# Patient Record
Sex: Female | Born: 1950
Health system: Southern US, Community
[De-identification: ages and names within clinical notes are randomized; demographics above are authoritative.]

## PROBLEM LIST (undated history)

## (undated) DIAGNOSIS — I340 Nonrheumatic mitral (valve) insufficiency: Secondary | ICD-10-CM

## (undated) DIAGNOSIS — Z8541 Personal history of malignant neoplasm of cervix uteri: Secondary | ICD-10-CM

## (undated) DIAGNOSIS — E785 Hyperlipidemia, unspecified: Secondary | ICD-10-CM

## (undated) DIAGNOSIS — J45909 Unspecified asthma, uncomplicated: Secondary | ICD-10-CM

## (undated) DIAGNOSIS — K219 Gastro-esophageal reflux disease without esophagitis: Secondary | ICD-10-CM

## (undated) DIAGNOSIS — I5189 Other ill-defined heart diseases: Secondary | ICD-10-CM

## (undated) DIAGNOSIS — M1990 Unspecified osteoarthritis, unspecified site: Secondary | ICD-10-CM

## (undated) DIAGNOSIS — G4730 Sleep apnea, unspecified: Secondary | ICD-10-CM

## (undated) DIAGNOSIS — R079 Chest pain, unspecified: Secondary | ICD-10-CM

## (undated) DIAGNOSIS — N189 Chronic kidney disease, unspecified: Secondary | ICD-10-CM

## (undated) DIAGNOSIS — I2721 Secondary pulmonary arterial hypertension: Secondary | ICD-10-CM

## (undated) DIAGNOSIS — I4819 Other persistent atrial fibrillation: Secondary | ICD-10-CM

## (undated) DIAGNOSIS — I10 Essential (primary) hypertension: Secondary | ICD-10-CM

## (undated) HISTORY — DX: Sleep apnea, unspecified: G47.30

## (undated) HISTORY — DX: Other persistent atrial fibrillation: I48.19

## (undated) HISTORY — DX: Chest pain, unspecified: R07.9

## (undated) HISTORY — DX: Gastro-esophageal reflux disease without esophagitis: K21.9

## (undated) HISTORY — PX: LIPOMA EXCISION: SHX5283

## (undated) HISTORY — DX: Hyperlipidemia, unspecified: E78.5

## (undated) HISTORY — DX: Other ill-defined heart diseases: I51.89

## (undated) HISTORY — DX: Essential (primary) hypertension: I10

## (undated) HISTORY — DX: Unspecified osteoarthritis, unspecified site: M19.90

## (undated) HISTORY — DX: Unspecified asthma, uncomplicated: J45.909

## (undated) HISTORY — DX: Secondary pulmonary arterial hypertension: I27.21

## (undated) HISTORY — DX: Personal history of malignant neoplasm of cervix uteri: Z85.41

## (undated) HISTORY — DX: Nonrheumatic mitral (valve) insufficiency: I34.0

---

## 1997-01-25 HISTORY — PX: ABDOMINAL HYSTERECTOMY: SHX81

## 2001-01-25 HISTORY — PX: HAND SURGERY: SHX662

## 2008-03-28 DIAGNOSIS — R079 Chest pain, unspecified: Secondary | ICD-10-CM | POA: Insufficient documentation

## 2008-03-28 DIAGNOSIS — J45909 Unspecified asthma, uncomplicated: Secondary | ICD-10-CM | POA: Insufficient documentation

## 2008-03-28 DIAGNOSIS — I10 Essential (primary) hypertension: Secondary | ICD-10-CM | POA: Insufficient documentation

## 2008-03-28 DIAGNOSIS — Z8541 Personal history of malignant neoplasm of cervix uteri: Secondary | ICD-10-CM | POA: Insufficient documentation

## 2008-03-28 LAB — HM PAP SMEAR: HM PAP: NEGATIVE

## 2008-04-11 DIAGNOSIS — E7800 Pure hypercholesterolemia, unspecified: Secondary | ICD-10-CM | POA: Insufficient documentation

## 2008-06-17 DIAGNOSIS — R1013 Epigastric pain: Secondary | ICD-10-CM | POA: Insufficient documentation

## 2008-06-19 ENCOUNTER — Ambulatory Visit: Payer: Self-pay | Admitting: Family Medicine

## 2014-11-21 ENCOUNTER — Ambulatory Visit (INDEPENDENT_AMBULATORY_CARE_PROVIDER_SITE_OTHER): Payer: Self-pay | Admitting: Family Medicine

## 2014-11-21 ENCOUNTER — Encounter: Payer: Self-pay | Admitting: Family Medicine

## 2014-11-21 VITALS — BP 178/88 | HR 84 | Temp 98.1°F | Resp 16 | Ht 65.0 in | Wt 230.0 lb

## 2014-11-21 DIAGNOSIS — E7800 Pure hypercholesterolemia, unspecified: Secondary | ICD-10-CM

## 2014-11-21 DIAGNOSIS — G4700 Insomnia, unspecified: Secondary | ICD-10-CM

## 2014-11-21 DIAGNOSIS — K8020 Calculus of gallbladder without cholecystitis without obstruction: Secondary | ICD-10-CM | POA: Insufficient documentation

## 2014-11-21 DIAGNOSIS — Z0000 Encounter for general adult medical examination without abnormal findings: Secondary | ICD-10-CM

## 2014-11-21 DIAGNOSIS — M25559 Pain in unspecified hip: Secondary | ICD-10-CM | POA: Insufficient documentation

## 2014-11-21 DIAGNOSIS — I10 Essential (primary) hypertension: Secondary | ICD-10-CM

## 2014-11-21 MED ORDER — LISINOPRIL 10 MG PO TABS: 10.0000 mg | ORAL_TABLET | Freq: Every day | ORAL | Status: DC

## 2014-11-21 MED ORDER — TRAZODONE HCL 50 MG PO TABS: 25.0000 mg | ORAL_TABLET | Freq: Every evening | ORAL | Status: DC | PRN

## 2014-11-21 NOTE — Progress Notes (Signed)
Patient ID: Whitney Stuart, female   DOB: September 05, 1950, 64 y.o.   MRN: [id number]        Patient: Whitney Stuart, Female    DOB: 07-30-1950, 64 y.o.   MRN: [id number] Visit Date: 11/21/2014  Today's Provider: Lorie Phenix, MD   Chief Complaint  Patient presents with  . Annual Exam   Subjective:    Annual physical exam Whitney Stuart is a 64 y.o. female who presents today for health maintenance and complete physical. She feels fairly well. She reports exercising stays active daily. She reports she is sleeping poorly, 4-5 hours of sleep. Has been that way since her children were little. Very light sleeper.  01/03/12 CPE 03/28/08 Pap-neg; hysterectomy in 1999 06/17/08 EKG 12/2011 Last OV   Hypertension, follow-up:  BP Readings from Last 3 Encounters:  11/21/14 178/88  01/03/12 182/92    She was last seen for hypertension 3 years ago.  BP at that visit was 182/92 Management changes since that visit include none. She reports poor compliance with treatment. She is not having side effects.  She is not exercising. She is adherent to low salt diet.   Outside blood pressures are not being checked. She is experiencing dyspnea, fatigue and lower extremity edema.  Patient denies chest pain.   Cardiovascular risk factors include none.  Use of agents associated with hypertension: none.     Weight trend: decreasing rapidly Wt Readings from Last 3 Encounters:  11/21/14 230 lb (104.327 kg)  01/03/12 199 lb (90.266 kg)    Current diet: in general, an "unhealthy" diet  ------------------------------------------------------------------------     Review of Systems  Constitutional: Negative.   HENT: Negative.   Eyes: Negative.   Respiratory: Positive for shortness of breath.   Cardiovascular: Positive for leg swelling.  Gastrointestinal: Positive for abdominal distention.  Endocrine: Negative.   Genitourinary: Negative.   Musculoskeletal: Negative.   Skin: Negative.     Allergic/Immunologic: Negative.   Neurological: Positive for numbness.  Hematological: Negative.   Psychiatric/Behavioral: Negative.     Social History She  reports that she has never smoked. She has never used smokeless tobacco. She reports that she does not drink alcohol or use illicit drugs. Social History   Social History  . Marital Status: Married    Spouse Name: N/A  . Number of Children: N/A  . Years of Education: N/A   Social History Main Topics  . Smoking status: Never Smoker   . Smokeless tobacco: Never Used  . Alcohol Use: No  . Drug Use: No  . Sexual Activity: Not Asked   Other Topics Concern  . None   Social History Narrative    Patient Active Problem List   Diagnosis Date Noted  . Calculus of gallbladder 11/21/2014  . Arthralgia of hip 11/21/2014  . Abdominal pain, epigastric 06/17/2008  . Pure hypercholesterolemia 04/11/2008  . Asthma, exogenous 03/28/2008  . Chest pain 03/28/2008  . Essential (primary) hypertension 03/28/2008  . Personal history of malignant neoplasm of cervix uteri 03/28/2008    Past Surgical History  Procedure Laterality Date  . Abdominal hysterectomy  1999    total  . Hand surgery Bilateral 2003    carpal tunnel    Family History  Family Status  Relation Status Death Age  . Father Deceased 70    kidney disease heavy smoker  . Mother Deceased 64    stroke  . Sister Alive   . Brother Deceased 61    lung cancer  .  Brother Alive    Her family history includes Heart disease in her brother; Hypertension in her father.    No Known Allergies  Previous Medications   No medications on file    Patient Care Team: Lorie PhenixNancy Sindhu Nguyen, MD as PCP - General (Family Medicine)     Objective:   Vitals: BP 178/88 mmHg  Pulse 84  Temp(Src) 98.1 F (36.7 C) (Oral)  Resp 16  Ht 5\' 5"  (1.651 m)  Wt 230 lb (104.327 kg)  BMI 38.27 kg/m2  SpO2 97%   Physical Exam  Constitutional: She is oriented to person, place, and time. She  appears well-developed and well-nourished.  HENT:  Head: Normocephalic and atraumatic.  Right Ear: Tympanic membrane, external ear and ear canal normal.  Left Ear: Tympanic membrane, external ear and ear canal normal.  Nose: Nose normal.  Mouth/Throat: Uvula is midline, oropharynx is clear and moist and mucous membranes are normal.  Eyes: Conjunctivae, EOM and lids are normal. Pupils are equal, round, and reactive to light.  Neck: Trachea normal and normal range of motion. Neck supple. Carotid bruit is not present. No thyroid mass and no thyromegaly present.  Cardiovascular: Normal rate, regular rhythm and normal heart sounds.   Pulmonary/Chest: Effort normal and breath sounds normal.  Abdominal: Soft. Normal appearance and bowel sounds are normal. There is no hepatosplenomegaly. There is no tenderness.  Genitourinary: No breast swelling, tenderness or discharge.  Musculoskeletal: Normal range of motion.  Lymphadenopathy:    She has no cervical adenopathy.    She has no axillary adenopathy.  Neurological: She is alert and oriented to person, place, and time. She has normal strength. No cranial nerve deficit.  Skin: Skin is warm, dry and intact.  Psychiatric: She has a normal mood and affect. Her speech is normal and behavior is normal. Judgment and thought content normal. Cognition and memory are normal.     Depression Screen PHQ 2/9 Scores 11/21/2014  PHQ - 2 Score 3  PHQ- 9 Score 12      Assessment & Plan:     Routine Health Maintenance and Physical Exam  Exercise Activities and Dietary recommendations Goals    . Exercise 150 minutes per week (moderate activity)        There is no immunization history on file for this patient.  Health Maintenance  Topic Date Due  . Hepatitis C Screening  1950/07/13  . HIV Screening  05/12/1966  . TETANUS/TDAP  05/12/1970  . PAP SMEAR  05/11/1972  . MAMMOGRAM  05/11/2001  . COLONOSCOPY  05/11/2001  . ZOSTAVAX  05/12/2011  .  INFLUENZA VACCINE  08/26/2014        1. Annual physical exam Stable. Patient advised to continue eating healthy and exercise daily.  2. Essential (primary) hypertension Worsening. Patient has been off of medications for a while due to no insurance. F/U pending lab report. - CBC with Differential/Platelet - Comprehensive metabolic panel  3. Pure hypercholesterolemia - Lipid Panel With LDL/HDL Ratio - TSH   Patient seen and examined by Dr. Leo GrosserNancy J.. Suan Pyeatt, and note scribed by Liz BeachSulibeya S. Dimas, CMA.  I have reviewed the document for accuracy and completeness and I agree with above. Leo Grosser- Charyl Minervini J. Jessa Stinson, MD  Lorie PhenixNancy Chantee Cerino, MD   --------------------------------------------------------------------

## 2014-11-22 DIAGNOSIS — Z0000 Encounter for general adult medical examination without abnormal findings: Secondary | ICD-10-CM

## 2014-11-23 LAB — COMPREHENSIVE METABOLIC PANEL
A/G RATIO: 1.4 (ref 1.1–2.5)
ALBUMIN: 4.2 g/dL (ref 3.6–4.8)
ALK PHOS: 61 IU/L (ref 39–117)
ALT: 15 IU/L (ref 0–32)
AST: 9 IU/L (ref 0–40)
BUN / CREAT RATIO: 11 (ref 11–26)
BUN: 10 mg/dL (ref 8–27)
Bilirubin Total: 0.4 mg/dL (ref 0.0–1.2)
CO2: 25 mmol/L (ref 18–29)
CREATININE: 0.9 mg/dL (ref 0.57–1.00)
Calcium: 9.5 mg/dL (ref 8.7–10.3)
Chloride: 103 mmol/L (ref 97–106)
GFR calc Af Amer: 79 mL/min/{1.73_m2} (ref >59)
GFR calc non Af Amer: 68 mL/min/{1.73_m2} (ref >59)
GLOBULIN, TOTAL: 3 g/dL (ref 1.5–4.5)
Glucose: 90 mg/dL (ref 65–99)
POTASSIUM: 4.4 mmol/L (ref 3.5–5.2)
SODIUM: 142 mmol/L (ref 136–144)
Total Protein: 7.2 g/dL (ref 6.0–8.5)

## 2014-11-23 LAB — LIPID PANEL WITH LDL/HDL RATIO
Cholesterol, Total: 219 mg/dL — ABNORMAL HIGH (ref 100–199)
HDL: 49 mg/dL (ref >39)
LDL CALC: 137 mg/dL — AB (ref 0–99)
LDL/HDL RATIO: 2.8 ratio (ref 0.0–3.2)
Triglycerides: 163 mg/dL — ABNORMAL HIGH (ref 0–149)
VLDL Cholesterol Cal: 33 mg/dL (ref 5–40)

## 2014-11-23 LAB — CBC WITH DIFFERENTIAL/PLATELET
Basophils Absolute: 0.1 x10E3/uL (ref 0.0–0.2)
Basos: 1 %
EOS (ABSOLUTE): 0.2 x10E3/uL (ref 0.0–0.4)
Eos: 3 %
Hematocrit: 36.4 % (ref 34.0–46.6)
Hemoglobin: 12.2 g/dL (ref 11.1–15.9)
Immature Grans (Abs): 0 x10E3/uL (ref 0.0–0.1)
Immature Granulocytes: 0 %
Lymphocytes Absolute: 3.2 x10E3/uL — ABNORMAL HIGH (ref 0.7–3.1)
Lymphs: 40 %
MCH: 30.3 pg (ref 26.6–33.0)
MCHC: 33.5 g/dL (ref 31.5–35.7)
MCV: 91 fL (ref 79–97)
Monocytes Absolute: 0.7 x10E3/uL (ref 0.1–0.9)
Monocytes: 9 %
Neutrophils Absolute: 3.9 x10E3/uL (ref 1.4–7.0)
Neutrophils: 47 %
Platelets: 346 x10E3/uL (ref 150–379)
RBC: 4.02 x10E6/uL (ref 3.77–5.28)
RDW: 13.9 % (ref 12.3–15.4)
WBC: 8.1 x10E3/uL (ref 3.4–10.8)

## 2014-11-23 LAB — TSH: TSH: 0.888 u[IU]/mL (ref 0.450–4.500)

## 2014-11-26 ENCOUNTER — Telehealth: Payer: Self-pay

## 2014-11-26 DIAGNOSIS — E7800 Pure hypercholesterolemia, unspecified: Secondary | ICD-10-CM

## 2014-11-26 MED ORDER — ATORVASTATIN CALCIUM 10 MG PO TABS: 10.0000 mg | ORAL_TABLET | Freq: Every day | ORAL | Status: DC

## 2014-11-26 NOTE — Telephone Encounter (Signed)
-----   Message from Lorie PhenixNancy Maloney, MD sent at 11/24/2014  9:47 AM EDT ----- Labs stable.  Cholesterol is 219.  10 year risk of heart disease is almost 13 percent.  A statin drug is recommended. Please see if patient would like to start a statin. Also,  Make sure  to start to baby ASA daily . Thanks.

## 2014-11-26 NOTE — Telephone Encounter (Signed)
Pt advised.  She agreed to start a statin; please send to [facility] [address]  I advised her of the potential side effects; and told her to stop taking the medicine and give us a call.  She is going to call in 4-6 weeks to get her cholesterol rechecked.  Thanks,   -Vernona RiegerLaura

## 2014-12-09 ENCOUNTER — Telehealth: Payer: Self-pay | Admitting: Family Medicine

## 2014-12-09 DIAGNOSIS — I10 Essential (primary) hypertension: Secondary | ICD-10-CM

## 2014-12-09 MED ORDER — AMLODIPINE BESYLATE 5 MG PO TABS: 5.0000 mg | ORAL_TABLET | Freq: Every day | ORAL | Status: DC

## 2014-12-09 NOTE — Telephone Encounter (Signed)
Pt reports her daughter is paramedic who checked her BP last night. Pt denied any sx at the time of elevated BP. [facility] Pt will schedule 4 week FU, but is aware to monitor BP and call if no improvement. Allene DillonEmily Drozdowski, CMA

## 2014-12-09 NOTE — Telephone Encounter (Signed)
Pt said you ask her to call in today with her blood pressure.  Which is 230/120 that was yesterday.  Call back is [phone]  Thanks Barth Kirksteri

## 2016-11-24 DIAGNOSIS — Z6836 Body mass index (BMI) 36.0-36.9, adult: Secondary | ICD-10-CM | POA: Diagnosis not present

## 2016-11-24 DIAGNOSIS — Z0000 Encounter for general adult medical examination without abnormal findings: Secondary | ICD-10-CM | POA: Diagnosis not present

## 2016-11-24 DIAGNOSIS — Z87891 Personal history of nicotine dependence: Secondary | ICD-10-CM | POA: Diagnosis not present

## 2016-11-24 DIAGNOSIS — Z8249 Family history of ischemic heart disease and other diseases of the circulatory system: Secondary | ICD-10-CM | POA: Diagnosis not present

## 2016-11-24 DIAGNOSIS — E669 Obesity, unspecified: Secondary | ICD-10-CM | POA: Diagnosis not present

## 2017-01-12 ENCOUNTER — Ambulatory Visit (INDEPENDENT_AMBULATORY_CARE_PROVIDER_SITE_OTHER): Payer: Medicare HMO | Admitting: Family Medicine

## 2017-01-12 ENCOUNTER — Other Ambulatory Visit: Payer: Self-pay

## 2017-01-12 ENCOUNTER — Other Ambulatory Visit: Payer: Self-pay | Admitting: Family Medicine

## 2017-01-12 ENCOUNTER — Encounter: Payer: Self-pay | Admitting: Family Medicine

## 2017-01-12 VITALS — BP 162/102 | HR 81 | Temp 98.1°F | Resp 16 | Ht 64.0 in | Wt 215.0 lb

## 2017-01-12 DIAGNOSIS — Z0000 Encounter for general adult medical examination without abnormal findings: Secondary | ICD-10-CM | POA: Diagnosis not present

## 2017-01-12 DIAGNOSIS — Z23 Encounter for immunization: Secondary | ICD-10-CM

## 2017-01-12 DIAGNOSIS — Z780 Asymptomatic menopausal state: Secondary | ICD-10-CM | POA: Diagnosis not present

## 2017-01-12 DIAGNOSIS — G4700 Insomnia, unspecified: Secondary | ICD-10-CM

## 2017-01-12 DIAGNOSIS — Z1211 Encounter for screening for malignant neoplasm of colon: Secondary | ICD-10-CM | POA: Diagnosis not present

## 2017-01-12 DIAGNOSIS — Z1231 Encounter for screening mammogram for malignant neoplasm of breast: Secondary | ICD-10-CM | POA: Diagnosis not present

## 2017-01-12 DIAGNOSIS — I10 Essential (primary) hypertension: Secondary | ICD-10-CM

## 2017-01-12 DIAGNOSIS — E7800 Pure hypercholesterolemia, unspecified: Secondary | ICD-10-CM

## 2017-01-12 DIAGNOSIS — Z1239 Encounter for other screening for malignant neoplasm of breast: Secondary | ICD-10-CM

## 2017-01-12 MED ORDER — ATORVASTATIN CALCIUM 10 MG PO TABS: 10.0000 mg | ORAL_TABLET | Freq: Every day | ORAL | 1 refills | Status: DC

## 2017-01-12 MED ORDER — LISINOPRIL 10 MG PO TABS: 10.0000 mg | ORAL_TABLET | Freq: Every day | ORAL | 1 refills | Status: DC

## 2017-01-12 MED ORDER — AMLODIPINE BESYLATE 5 MG PO TABS: 5.0000 mg | ORAL_TABLET | Freq: Every day | ORAL | 1 refills | Status: DC

## 2017-01-12 MED ORDER — TRAZODONE HCL 50 MG PO TABS: 25.0000 mg | ORAL_TABLET | Freq: Every evening | ORAL | 1 refills | Status: DC | PRN

## 2017-01-12 NOTE — Patient Instructions (Signed)
Preventive Care 65 Years and Older, Female Preventive care refers to lifestyle choices and visits with your health care provider that can promote health and wellness. What does preventive care include?  A yearly physical exam. This is also called an annual well check.  Dental exams once or twice a year.  Routine eye exams. Ask your health care provider how often you should have your eyes checked.  Personal lifestyle choices, including: ? Daily care of your teeth and gums. ? Regular physical activity. ? Eating a healthy diet. ? Avoiding tobacco and drug use. ? Limiting alcohol use. ? Practicing safe sex. ? Taking low-dose aspirin every day. ? Taking vitamin and mineral supplements as recommended by your health care provider. What happens during an annual well check? The services and screenings done by your health care provider during your annual well check will depend on your age, overall health, lifestyle risk factors, and family history of disease. Counseling Your health care provider may ask you questions about your:  Alcohol use.  Tobacco use.  Drug use.  Emotional well-being.  Home and relationship well-being.  Sexual activity.  Eating habits.  History of falls.  Memory and ability to understand (cognition).  Work and work environment.  Reproductive health.  Screening You may have the following tests or measurements:  Height, weight, and BMI.  Blood pressure.  Lipid and cholesterol levels. These may be checked every 5 years, or more frequently if you are over 50 years old.  Skin check.  Lung cancer screening. You may have this screening every year starting at age 55 if you have a 30-pack-year history of smoking and currently smoke or have quit within the past 15 years.  Fecal occult blood test (FOBT) of the stool. You may have this test every year starting at age 50.  Flexible sigmoidoscopy or colonoscopy. You may have a sigmoidoscopy every 5 years or  a colonoscopy every 10 years starting at age 50.  Hepatitis C blood test.  Hepatitis B blood test.  Sexually transmitted disease (STD) testing.  Diabetes screening. This is done by checking your blood sugar (glucose) after you have not eaten for a while (fasting). You may have this done every 1-3 years.  Bone density scan. This is done to screen for osteoporosis. You may have this done starting at age 65.  Mammogram. This may be done every 1-2 years. Talk to your health care provider about how often you should have regular mammograms.  Talk with your health care provider about your test results, treatment options, and if necessary, the need for more tests. Vaccines Your health care provider may recommend certain vaccines, such as:  Influenza vaccine. This is recommended every year.  Tetanus, diphtheria, and acellular pertussis (Tdap, Td) vaccine. You may need a Td booster every 10 years.  Varicella vaccine. You may need this if you have not been vaccinated.  Zoster vaccine. You may need this after age 60.  Measles, mumps, and rubella (MMR) vaccine. You may need at least one dose of MMR if you were born in 1957 or later. You may also need a second dose.  Pneumococcal 13-valent conjugate (PCV13) vaccine. One dose is recommended after age 65.  Pneumococcal polysaccharide (PPSV23) vaccine. One dose is recommended after age 65.  Meningococcal vaccine. You may need this if you have certain conditions.  Hepatitis A vaccine. You may need this if you have certain conditions or if you travel or work in places where you may be exposed to hepatitis   A.  Hepatitis B vaccine. You may need this if you have certain conditions or if you travel or work in places where you may be exposed to hepatitis B.  Haemophilus influenzae type b (Hib) vaccine. You may need this if you have certain conditions.  Talk to your health care provider about which screenings and vaccines you need and how often you  need them. This information is not intended to replace advice given to you by your health care provider. Make sure you discuss any questions you have with your health care provider. Document Released: 02/07/2015 Document Revised: 10/01/2015 Document Reviewed: 11/12/2014 Elsevier Interactive Patient Education  2018 Elsevier Inc.  

## 2017-01-12 NOTE — Progress Notes (Signed)
Patient: Whitney Stuart, Female    DOB: 27-Jul-1950, 66 y.o.   MRN: [id number] Visit Date: 01/12/2017  Today's Provider: Shirlee Latch, MD   Chief Complaint  Patient presents with  . Medicare Wellness   I, Emily Ratchford, CMA, am acting as scribe for Shirlee Latch, MD.   Subjective:    Welcome to Medicare Whitney Stuart is a 66 y.o. female. She feels well. She reports exercising daily. Walks for 30-45 minutes. She reports she is sleeping poorly.  Last pap- 03/28/2008- S/P total hysterectomy in 2009 due to cervical cancer. No previous documentation of mammogram, dexa scan or colonoscopy.  -----------------------------------------------------------   Review of Systems  Constitutional: Negative.   HENT: Negative.   Eyes: Negative.   Respiratory: Negative.   Cardiovascular: Negative.   Gastrointestinal: Negative.   Endocrine: Negative.   Genitourinary: Negative.   Musculoskeletal: Negative.   Skin: Negative.   Allergic/Immunologic: Negative.   Neurological: Negative.   Hematological: Negative.   Psychiatric/Behavioral: Negative.     Social History   Socioeconomic History  . Marital status: Married    Spouse name: Not on file  . Number of children: Not on file  . Years of education: Not on file  . Highest education level: Not on file  Social Needs  . Financial resource strain: Not on file  . Food insecurity - worry: Not on file  . Food insecurity - inability: Not on file  . Transportation needs - medical: Not on file  . Transportation needs - non-medical: Not on file  Occupational History  . Not on file  Tobacco Use  . Smoking status: Never Smoker  . Smokeless tobacco: Never Used  Substance and Sexual Activity  . Alcohol use: No  . Drug use: No  . Sexual activity: Not on file  Other Topics Concern  . Not on file  Social History Narrative  . Not on file    Past Medical History:  Diagnosis Date  . Asthma   . Hyperlipidemia   .  Hypertension      Patient Active Problem List   Diagnosis Date Noted  . Calculus of gallbladder 11/21/2014  . Arthralgia of hip 11/21/2014  . Insomnia 11/21/2014  . Annual physical exam 11/21/2014  . Abdominal pain, epigastric 06/17/2008  . Pure hypercholesterolemia 04/11/2008  . Asthma, exogenous 03/28/2008  . Chest pain 03/28/2008  . Essential (primary) hypertension 03/28/2008  . Personal history of malignant neoplasm of cervix uteri 03/28/2008    Past Surgical History:  Procedure Laterality Date  . ABDOMINAL HYSTERECTOMY  1999   total  . HAND SURGERY Bilateral 2003   carpal tunnel    Her family history includes Heart disease in her brother; Hypertension in her father.      Current Outpatient Medications:  .  amLODipine (NORVASC) 5 MG tablet, Take 1 tablet (5 mg total) by mouth daily., Disp: 90 tablet, Rfl: 3 .  atorvastatin (LIPITOR) 10 MG tablet, Take 1 tablet (10 mg total) by mouth daily., Disp: 90 tablet, Rfl: 3 .  lisinopril (PRINIVIL,ZESTRIL) 10 MG tablet, Take 1 tablet (10 mg total) by mouth daily., Disp: 90 tablet, Rfl: 3 .  traZODone (DESYREL) 50 MG tablet, Take 0.5-1 tablets (25-50 mg total) by mouth at bedtime as needed for sleep., Disp: 30 tablet, Rfl: 3  Patient Care Team: Erasmo Downer, MD as PCP - General (Family Medicine)     Objective:   Vitals: There were no vitals taken for this visit.  Physical Exam  Constitutional: She is oriented to Stuart, place, and time. She appears well-developed and well-nourished. No distress.  HENT:  Head: Normocephalic and atraumatic.  Right Ear: External ear normal.  Left Ear: External ear normal.  Nose: Nose normal.  Mouth/Throat: Oropharynx is clear and moist. No oropharyngeal exudate.  Eyes: Conjunctivae and EOM are normal. Pupils are equal, round, and reactive to light. No scleral icterus.  Neck: Neck supple. No thyromegaly present.  Cardiovascular: Normal rate, regular rhythm, normal heart sounds and  intact distal pulses.  No murmur heard. Pulmonary/Chest: Effort normal and breath sounds normal. No respiratory distress. She has no wheezes. She has no rales.  Abdominal: Soft. Bowel sounds are normal. She exhibits no distension. There is no tenderness. There is no rebound and no guarding.  Genitourinary:  Genitourinary Comments: deferred  Musculoskeletal: She exhibits no edema or deformity.  Lymphadenopathy:    She has no cervical adenopathy.  Neurological: She is alert and oriented to Stuart, place, and time.  Skin: Skin is warm and dry. No rash noted.  Psychiatric: She has a normal mood and affect. Her behavior is normal.  Vitals reviewed.   Activities of Daily Living No flowsheet data found.  Fall Risk Assessment Fall Risk  11/21/2014  Falls in the past year? No     Depression Screen PHQ 2/9 Scores 11/21/2014  PHQ - 2 Score 3  PHQ- 9 Score 12    Cognitive Testing - 6-CIT  Correct? Score   What year is it? yes 0 0 or 4  What month is it? yes 0 0 or 3  Memorize:    [name],  [name],  43,  [address],  [name],      What time is it? (within 1 hour) yes 0 0 or 3  Count backwards from 20 yes 0 0, 2, or 4  Name the months of the year yes 0 0, 2, or 4  Repeat name & address above no 2 0, 2, 4, 6, 8, or 10       TOTAL SCORE  2/28   Interpretation:  Normal  Normal (0-7) Abnormal (8-28)    Audit-C Alcohol Use Screening  Question Answer Points  How often do you have alcoholic drink? never 0  On days you do drink alcohol, how many drinks do you typically consume? never 0  How oftey will you drink 6 or more in a total? never 0  Total Score:  0   A score of 3 or more in women, and 4 or more in men indicates increased risk for alcohol abuse, EXCEPT if all of the points are from question 1.   EKG reviewed: NSR, normal axis, intervals, no LVH or ST/T wave changes to suggest ischemia  Assessment & Plan:     Annual Wellness Visit  Reviewed patient's Family Medical  History Reviewed and updated list of patient's medical providers Assessment of cognitive impairment was done Assessed patient's functional ability Established a written schedule for health screening services Health Risk Assessent Completed and Reviewed  Exercise Activities and Dietary recommendations Goals    . Exercise 150 minutes per week (moderate activity)        There is no immunization history on file for this patient.  Health Maintenance  Topic Date Due  . Hepatitis C Screening  [birth year]  . HIV Screening  05/12/1966  . TETANUS/TDAP  05/12/1970  . MAMMOGRAM  05/11/2001  . COLONOSCOPY  05/11/2001  . PAP SMEAR  03/29/2011  . DEXA SCAN  05/11/2016  . PNA vac Low Risk Adult (1 of 2 - PCV13) 05/11/2016  . INFLUENZA VACCINE  08/25/2016     Discussed health benefits of physical activity, and encouraged her to engage in regular exercise appropriate for her age and condition.     Recommend Hep C, HIV screening - prefers to defer to f/u chronic disease visit when other labs will be obtained BP is elevated - will defer to f/u visit and resume meds Recommend pap smear of vaginal cuff given h/o cervical cancer - will complete at next visit   Problem List Items Addressed This Visit    None    Visit Diagnoses    Welcome to Medicare preventive visit    -  Primary   Relevant Orders   EKG 12-Lead (Completed)   Need for pneumococcal vaccination       Relevant Orders   Pneumococcal conjugate vaccine 13-valent IM (Completed)   Flu vaccine need       Relevant Orders   Flu vaccine HIGH DOSE PF (Completed)   Colon cancer screening       Relevant Orders   Cologuard   Breast cancer screening       Relevant Orders   MM Digital Screening   Postmenopausal       Relevant Orders   DG Bone Density     ------------------------------------------------------------------------------------------------------------   Return in about 4 weeks (around 02/09/2017) for BP f/u.  The  entirety of the information documented in the History of Present Illness, Review of Systems and Physical Exam were personally obtained by me. Portions of this information were initially documented by Irving BurtonEmily Ratchford, CMA and reviewed by me for thoroughness and accuracy.    Erasmo DownerBacigalupo, Hart Haas M, MD, MPH [facility] 01/12/2017 11:18 AM

## 2017-01-24 ENCOUNTER — Telehealth: Payer: Self-pay | Admitting: Family Medicine

## 2017-01-24 NOTE — Telephone Encounter (Signed)
Order for cologuard faxed to [facility] °

## 2017-02-04 ENCOUNTER — Other Ambulatory Visit: Payer: Self-pay | Admitting: Family Medicine

## 2017-02-04 DIAGNOSIS — G4700 Insomnia, unspecified: Secondary | ICD-10-CM

## 2017-02-04 DIAGNOSIS — I10 Essential (primary) hypertension: Secondary | ICD-10-CM

## 2017-02-04 DIAGNOSIS — E7800 Pure hypercholesterolemia, unspecified: Secondary | ICD-10-CM

## 2017-02-04 MED ORDER — ATORVASTATIN CALCIUM 10 MG PO TABS: 10.0000 mg | ORAL_TABLET | Freq: Every day | ORAL | 1 refills | Status: DC

## 2017-02-04 MED ORDER — TRAZODONE HCL 50 MG PO TABS: 25.0000 mg | ORAL_TABLET | Freq: Every evening | ORAL | 1 refills | Status: DC | PRN

## 2017-02-04 MED ORDER — AMLODIPINE BESYLATE 5 MG PO TABS: 5.0000 mg | ORAL_TABLET | Freq: Every day | ORAL | 1 refills | Status: DC

## 2017-02-04 MED ORDER — LISINOPRIL 10 MG PO TABS: 10.0000 mg | ORAL_TABLET | Freq: Every day | ORAL | 1 refills | Status: DC

## 2017-02-04 NOTE — Telephone Encounter (Signed)
[facility] faxed refill request for the following medications:  1. atorvastatin (LIPITOR) 10 MG tablet  2. traZODone (DESYREL) 50 MG tablet  3. amLODipine (NORVASC) 5 MG tablet 4. lisinopril (PRINIVIL,ZESTRIL) 10 MG tablet  90 day supply  Last Rx: 01/12/17 30 day supply with 1 refill LOV: 01/12/17 Please advise. Thanks TNP

## 2017-02-04 NOTE — Telephone Encounter (Signed)
[facility] faxed refill request for the following medications:  atorvastatin (LIPITOR) 10 MG tablet   90 day supply   Last Rx: 01/12/17 30 day supply with 1 refill LOV: 01/12/17 Please advise. Thanks TNP

## 2017-02-07 DIAGNOSIS — Z1211 Encounter for screening for malignant neoplasm of colon: Secondary | ICD-10-CM | POA: Diagnosis not present

## 2017-02-07 LAB — COLOGUARD: COLOGUARD: NEGATIVE

## 2017-02-09 ENCOUNTER — Ambulatory Visit (INDEPENDENT_AMBULATORY_CARE_PROVIDER_SITE_OTHER): Payer: Medicare HMO | Admitting: Family Medicine

## 2017-02-09 ENCOUNTER — Encounter: Payer: Self-pay | Admitting: Family Medicine

## 2017-02-09 VITALS — BP 160/90 | HR 77 | Temp 98.5°F | Resp 16 | Wt 218.0 lb

## 2017-02-09 DIAGNOSIS — E669 Obesity, unspecified: Secondary | ICD-10-CM | POA: Diagnosis not present

## 2017-02-09 DIAGNOSIS — R69 Illness, unspecified: Secondary | ICD-10-CM | POA: Diagnosis not present

## 2017-02-09 DIAGNOSIS — Z6837 Body mass index (BMI) 37.0-37.9, adult: Secondary | ICD-10-CM | POA: Diagnosis not present

## 2017-02-09 DIAGNOSIS — I10 Essential (primary) hypertension: Secondary | ICD-10-CM | POA: Diagnosis not present

## 2017-02-09 DIAGNOSIS — Z114 Encounter for screening for human immunodeficiency virus [HIV]: Secondary | ICD-10-CM | POA: Diagnosis not present

## 2017-02-09 DIAGNOSIS — E7800 Pure hypercholesterolemia, unspecified: Secondary | ICD-10-CM | POA: Diagnosis not present

## 2017-02-09 DIAGNOSIS — Z1159 Encounter for screening for other viral diseases: Secondary | ICD-10-CM

## 2017-02-09 DIAGNOSIS — E66811 Obesity, class 1: Secondary | ICD-10-CM | POA: Insufficient documentation

## 2017-02-09 MED ORDER — AMLODIPINE BESYLATE 10 MG PO TABS: 10.0000 mg | ORAL_TABLET | Freq: Every day | ORAL | 3 refills | Status: DC

## 2017-02-09 NOTE — Assessment & Plan Note (Signed)
Chronic, uncontrolled Increase amlodipine to 10mg  daily Continue lisinopril at current dose Check CMP F/u in 1 month, if needed, can increase lisinopril

## 2017-02-09 NOTE — Assessment & Plan Note (Signed)
Discussed diet and exrecise and importance of healthy weight Discussed intermittent fasting and decreasing carb intake Given DM diet handout

## 2017-02-09 NOTE — Progress Notes (Signed)
Patient: Whitney Stuart Female    DOB: 04-Aug-1950   67 y.o.   MRN: [id number] Visit Date: 02/09/2017  Today's Provider: Shirlee Latch, MD   I, Joslyn Hy, CMA, am acting as scribe for Shirlee Latch, MD.  Chief Complaint  Patient presents with  . Hypertension  . Hyperlipidemia   Subjective:    HPI      Hypertension, follow-up:  BP Readings from Last 3 Encounters:  02/09/17 (!) 160/90  01/12/17 (!) 162/102  11/21/14 (!) 178/88    Pt was last seen 4 weeks ago. BP at that visit was 162/102. Management since that visit includes no changes.She reports good compliance with treatment. She is not having side effects.  She is not exercising. She is adherent to low salt diet.   Outside blood pressures are similar to readings in the office. Pt uses wrist monitor. She is experiencing fatigue and lower extremity edema.  Patient denies chest pain, chest pressure/discomfort, claudication, dyspnea, exertional chest pressure/discomfort, irregular heart beat, near-syncope, orthopnea, palpitations and syncope.   Cardiovascular risk factors include dyslipidemia, family history of premature cardiovascular disease and hypertension.  Use of agents associated with hypertension: none.   ------------------------------------------------------------------------    Lipid/Cholesterol, Follow-up:   No management changes were made at last office visit.  Last Lipid Panel:    Component Value Date/Time   CHOL 219 (H) 11/22/2014 1221   TRIG 163 (H) 11/22/2014 1221   HDL 49 11/22/2014 1221   LDLCALC 137 (H) 11/22/2014 1221    She reports good compliance with treatment. She is not having side effects.   Wt Readings from Last 3 Encounters:  02/09/17 218 lb (98.9 kg)  01/12/17 215 lb (97.5 kg)  11/21/14 230 lb (104.3 kg)    ------------------------------------------------------------------------   No Known Allergies   Current Outpatient Medications:  .  amLODipine  (NORVASC) 5 MG tablet, Take 1 tablet (5 mg total) by mouth daily., Disp: 90 tablet, Rfl: 1 .  atorvastatin (LIPITOR) 10 MG tablet, Take 1 tablet (10 mg total) by mouth daily., Disp: 90 tablet, Rfl: 1 .  lisinopril (PRINIVIL,ZESTRIL) 10 MG tablet, Take 1 tablet (10 mg total) by mouth daily., Disp: 90 tablet, Rfl: 1 .  traZODone (DESYREL) 50 MG tablet, Take 0.5-1 tablets (25-50 mg total) by mouth at bedtime as needed for sleep., Disp: 90 tablet, Rfl: 1  Review of Systems  Constitutional: Positive for fatigue. Negative for activity change, appetite change, chills, diaphoresis, fever and unexpected weight change.  Respiratory: Negative for cough and shortness of breath.   Cardiovascular: Positive for leg swelling (ankles after standing too long). Negative for chest pain and palpitations.    Social History   Tobacco Use  . Smoking status: Former Smoker    Packs/day: 0.50    Years: 6.00    Pack years: 3.00    Last attempt to quit: 01/24/1997    Years since quitting: 20.0  . Smokeless tobacco: Never Used  Substance Use Topics  . Alcohol use: No   Objective:   BP (!) 160/90 (BP Location: Left Arm, Patient Position: Sitting, Cuff Size: Large)   Pulse 77   Temp 98.5 F (36.9 C) (Oral)   Resp 16   Wt 218 lb (98.9 kg)   SpO2 96%   BMI 37.42 kg/m  Vitals:   02/09/17 1054  BP: (!) 160/90  Pulse: 77  Resp: 16  Temp: 98.5 F (36.9 C)  TempSrc: Oral  SpO2: 96%  Weight: 218 lb (98.9  kg)     Physical Exam  Constitutional: She is oriented to person, place, and time. She appears well-developed and well-nourished. No distress.  HENT:  Head: Normocephalic and atraumatic.  Eyes: Conjunctivae are normal. No scleral icterus.  Neck: Neck supple.  Cardiovascular: Normal rate, regular rhythm, normal heart sounds and intact distal pulses.  No murmur heard. Pulmonary/Chest: Effort normal and breath sounds normal. No respiratory distress. She has no wheezes. She has no rales.    Musculoskeletal: She exhibits no edema or deformity.  Lymphadenopathy:    She has no cervical adenopathy.  Neurological: She is alert and oriented to person, place, and time.  Skin: Skin is warm and dry. No rash noted.  Psychiatric: She has a normal mood and affect. Her behavior is normal.  Vitals reviewed.       Assessment & Plan:      Problem List Items Addressed This Visit      Cardiovascular and Mediastinum   Essential (primary) hypertension    Chronic, uncontrolled Increase amlodipine to 10mg  daily Continue lisinopril at current dose Check CMP F/u in 1 month, if needed, can increase lisinopril      Relevant Medications   amLODipine (NORVASC) 10 MG tablet   Other Relevant Orders   Comprehensive metabolic panel     Other   Pure hypercholesterolemia - Primary    Continue lipitor Recheck lipid panel      Relevant Medications   amLODipine (NORVASC) 10 MG tablet   Other Relevant Orders   Comprehensive metabolic panel   Lipid Profile   Obesity    Discussed diet and exrecise and importance of healthy weight Discussed intermittent fasting and decreasing carb intake Given DM diet handout      Relevant Orders   CBC    Other Visit Diagnoses    Screening for HIV (human immunodeficiency virus)       Relevant Orders   HIV antibody (with reflex)   Need for hepatitis C screening test       Relevant Orders   Hepatitis C Antibody     Needs pap smear for h/o cervical cancer at f/u visit  Return in about 4 weeks (around 03/09/2017) for BP f/u.      The entirety of the information documented in the History of Present Illness, Review of Systems and Physical Exam were personally obtained by me. Portions of this information were initially documented by Irving BurtonEmily Ratchford, CMA and reviewed by me for thoroughness and accuracy.    Erasmo DownerBacigalupo, Latrica Clowers M, MD, MPH [facility] 02/09/2017 1:03 PM

## 2017-02-09 NOTE — Patient Instructions (Signed)

## 2017-02-09 NOTE — Assessment & Plan Note (Signed)
Continue lipitor Recheck lipid panel 

## 2017-02-10 ENCOUNTER — Telehealth: Payer: Self-pay

## 2017-02-10 LAB — LIPID PANEL
CHOL/HDL RATIO: 2.8 ratio (ref 0.0–4.4)
Cholesterol, Total: 164 mg/dL (ref 100–199)
HDL: 58 mg/dL (ref >39)
LDL Calculated: 88 mg/dL (ref 0–99)
TRIGLYCERIDES: 88 mg/dL (ref 0–149)
VLDL CHOLESTEROL CAL: 18 mg/dL (ref 5–40)

## 2017-02-10 LAB — COMPREHENSIVE METABOLIC PANEL
ALBUMIN: 4.3 g/dL (ref 3.6–4.8)
ALT: 18 IU/L (ref 0–32)
AST: 9 IU/L (ref 0–40)
Albumin/Globulin Ratio: 1.3 (ref 1.2–2.2)
Alkaline Phosphatase: 67 IU/L (ref 39–117)
BUN / CREAT RATIO: 14 (ref 12–28)
BUN: 13 mg/dL (ref 8–27)
Bilirubin Total: 0.4 mg/dL (ref 0.0–1.2)
CALCIUM: 9.9 mg/dL (ref 8.7–10.3)
CO2: 22 mmol/L (ref 20–29)
CREATININE: 0.9 mg/dL (ref 0.57–1.00)
Chloride: 104 mmol/L (ref 96–106)
GFR calc Af Amer: 78 mL/min/{1.73_m2} (ref >59)
GFR, EST NON AFRICAN AMERICAN: 67 mL/min/{1.73_m2} (ref >59)
GLOBULIN, TOTAL: 3.2 g/dL (ref 1.5–4.5)
Glucose: 95 mg/dL (ref 65–99)
Potassium: 5.1 mmol/L (ref 3.5–5.2)
Sodium: 143 mmol/L (ref 134–144)
TOTAL PROTEIN: 7.5 g/dL (ref 6.0–8.5)

## 2017-02-10 LAB — CBC
HEMATOCRIT: 36.2 % (ref 34.0–46.6)
Hemoglobin: 12.4 g/dL (ref 11.1–15.9)
MCH: 31.1 pg (ref 26.6–33.0)
MCHC: 34.3 g/dL (ref 31.5–35.7)
MCV: 91 fL (ref 79–97)
PLATELETS: 342 10*3/uL (ref 150–379)
RBC: 3.99 x10E6/uL (ref 3.77–5.28)
RDW: 13.2 % (ref 12.3–15.4)
WBC: 7.4 10*3/uL (ref 3.4–10.8)

## 2017-02-10 LAB — HEPATITIS C ANTIBODY: Hep C Virus Ab: <0.1 s/co ratio (ref 0.0–0.9)

## 2017-02-10 LAB — HIV ANTIBODY (ROUTINE TESTING W REFLEX): HIV SCREEN 4TH GENERATION: NONREACTIVE

## 2017-02-10 NOTE — Telephone Encounter (Signed)
Pt advised.

## 2017-02-10 NOTE — Telephone Encounter (Signed)
-----   Message from Erasmo DownerAngela M Bacigalupo, MD sent at 02/10/2017 10:11 AM EST ----- Normal kidney function, liver function, electrolytes, Blood counts, cholesterol. Negative HIV and Hep C screening  Bacigalupo, Marzella SchleinAngela M, MD, MPH [facility] 02/10/2017 10:11 AM

## 2017-02-17 ENCOUNTER — Other Ambulatory Visit: Payer: Self-pay

## 2017-02-17 ENCOUNTER — Telehealth: Payer: Self-pay

## 2017-02-17 NOTE — Telephone Encounter (Signed)
-----   Message from Erasmo DownerAngela M Bacigalupo, MD sent at 02/17/2017 11:47 AM EST ----- Cologuard (for colon cancer screening) is negative. Repeat some form of colon cancer screening in 3 years.  Erasmo DownerBacigalupo, Angela M, MD, MPH [facility] 02/17/2017 11:47 AM

## 2017-02-17 NOTE — Telephone Encounter (Signed)
Pt advised and agrees with plan. 

## 2017-02-22 ENCOUNTER — Ambulatory Visit
Admission: RE | Admit: 2017-02-22 | Discharge: 2017-02-22 | Disposition: A | Discharge status: Home / Self Care | Payer: Medicare HMO | Source: Ambulatory Visit | Attending: Family Medicine | Admitting: Family Medicine

## 2017-02-22 DIAGNOSIS — Z780 Asymptomatic menopausal state: Secondary | ICD-10-CM | POA: Diagnosis not present

## 2017-02-22 DIAGNOSIS — Z1231 Encounter for screening mammogram for malignant neoplasm of breast: Secondary | ICD-10-CM | POA: Insufficient documentation

## 2017-02-22 DIAGNOSIS — M85852 Other specified disorders of bone density and structure, left thigh: Secondary | ICD-10-CM | POA: Insufficient documentation

## 2017-02-22 DIAGNOSIS — Z13820 Encounter for screening for osteoporosis: Secondary | ICD-10-CM | POA: Insufficient documentation

## 2017-02-22 DIAGNOSIS — Z1239 Encounter for other screening for malignant neoplasm of breast: Secondary | ICD-10-CM

## 2017-02-23 ENCOUNTER — Telehealth: Payer: Self-pay

## 2017-02-23 NOTE — Telephone Encounter (Signed)
Left message advising pt. OK per DPR. 

## 2017-02-23 NOTE — Telephone Encounter (Signed)
-----   Message from Erasmo DownerAngela M Bacigalupo, MD sent at 02/23/2017  9:21 AM EST ----- Normal mammogram  Bacigalupo, Marzella SchleinAngela M, MD, MPH [facility] 02/23/2017 9:21 AM

## 2017-02-23 NOTE — Telephone Encounter (Signed)
-----   Message from Erasmo DownerAngela M Bacigalupo, MD sent at 02/23/2017 10:06 AM EST ----- Bone density scans shows osteopenia (this is some bone loss, but not as bad as osteoporosis).  Recommend regular weight bearing exercise, avoiding smoking, and adequate Ca (1200mg /day) and Vit D (1000 units daily) via diet or supplement.  We will recheck in 2 years to ensure this hasn't worsened.  Erasmo DownerBacigalupo, Angela M, MD, MPH [facility] 02/23/2017 10:06 AM

## 2017-03-09 ENCOUNTER — Ambulatory Visit (INDEPENDENT_AMBULATORY_CARE_PROVIDER_SITE_OTHER): Payer: Medicare HMO | Admitting: Family Medicine

## 2017-03-09 ENCOUNTER — Encounter: Payer: Self-pay | Admitting: Family Medicine

## 2017-03-09 VITALS — BP 134/76 | HR 80 | Temp 98.2°F | Resp 16 | Wt 215.0 lb

## 2017-03-09 DIAGNOSIS — M62838 Other muscle spasm: Secondary | ICD-10-CM | POA: Diagnosis not present

## 2017-03-09 DIAGNOSIS — G4700 Insomnia, unspecified: Secondary | ICD-10-CM | POA: Diagnosis not present

## 2017-03-09 DIAGNOSIS — I10 Essential (primary) hypertension: Secondary | ICD-10-CM

## 2017-03-09 DIAGNOSIS — Z124 Encounter for screening for malignant neoplasm of cervix: Secondary | ICD-10-CM | POA: Diagnosis not present

## 2017-03-09 DIAGNOSIS — Z1272 Encounter for screening for malignant neoplasm of vagina: Secondary | ICD-10-CM

## 2017-03-09 DIAGNOSIS — E7800 Pure hypercholesterolemia, unspecified: Secondary | ICD-10-CM

## 2017-03-09 DIAGNOSIS — Z8541 Personal history of malignant neoplasm of cervix uteri: Secondary | ICD-10-CM

## 2017-03-09 MED ORDER — ATORVASTATIN CALCIUM 10 MG PO TABS: 10.0000 mg | ORAL_TABLET | Freq: Every day | ORAL | 3 refills | Status: DC

## 2017-03-09 MED ORDER — LISINOPRIL 10 MG PO TABS: 10.0000 mg | ORAL_TABLET | Freq: Every day | ORAL | 3 refills | Status: DC

## 2017-03-09 MED ORDER — AMLODIPINE BESYLATE 10 MG PO TABS: 10.0000 mg | ORAL_TABLET | Freq: Every day | ORAL | 3 refills | Status: DC

## 2017-03-09 MED ORDER — TRAZODONE HCL 50 MG PO TABS: 25.0000 mg | ORAL_TABLET | Freq: Every evening | ORAL | 3 refills | Status: DC | PRN

## 2017-03-09 MED ORDER — BACLOFEN 10 MG PO TABS: 10.0000 mg | ORAL_TABLET | Freq: Three times a day (TID) | ORAL | 1 refills | Status: DC

## 2017-03-09 NOTE — Assessment & Plan Note (Signed)
Well controlled Continue amlodipine and lisinopril at current doses Refills sent Recent labs reviewed, Cr and electrolytes wnl F/u in 6 months

## 2017-03-09 NOTE — Assessment & Plan Note (Signed)
Continue lipitor - refill sent

## 2017-03-09 NOTE — Assessment & Plan Note (Signed)
Discussed ice/heat Can continue tylenol prn Trial of baclofen for muscle relaxant HEP with stretching given Return precautions discussed

## 2017-03-09 NOTE — Progress Notes (Signed)
Patient: Whitney Stuart Female    DOB: July 08, 1950   67 y.o.   MRN: [id number] Visit Date: 03/09/2017  Today's Provider: Shirlee Latch, MD   I, Joslyn Hy, CMA, am acting as scribe for Shirlee Latch, MD.  Chief Complaint  Patient presents with  . Hypertension  . History of Cervical Cancer   Subjective:    HPI      Hypertension, follow-up:  BP Readings from Last 3 Encounters:  03/09/17 134/76  02/09/17 (!) 160/90  01/12/17 (!) 162/102    She was last seen for hypertension 4 weeks ago.  BP at that visit was 160/90. Management since that visit includes increasing amlodipine to 10 mg, and continuing lisinopril at same dose. She reports good compliance with treatment. She is not having side effects.  She is exercising. She is doing yard work when the weather is nice, and is walking inside. She is adherent to low salt diet.   Outside blood pressures are still elevated per pt. She is experiencing fatigue and lower extremity edema.  Patient denies chest pain, chest pressure/discomfort, claudication, dyspnea, exertional chest pressure/discomfort, irregular heart beat, near-syncope, orthopnea, palpitations and syncope.   Cardiovascular risk factors include dyslipidemia, family history of premature cardiovascular disease and hypertension.  Use of agents associated with hypertension: none.     Weight trend: fluctuating a bit Wt Readings from Last 3 Encounters:  03/09/17 215 lb (97.5 kg)  02/09/17 218 lb (98.9 kg)  01/12/17 215 lb (97.5 kg)    Current diet: in general, a "healthy" diet    History of Cervical Cancer Pt is due for a pap smear today.  Pt is also c/o intermittent "neck spasms". She states these are mostly moderate, but can be severe. She has tried APAP and neck exercises, with some relief.  Come and go throughout the day, but has some tightness and soreness  consistently. ------------------------------------------------------------------------   No Known Allergies   Current Outpatient Medications:  .  amLODipine (NORVASC) 10 MG tablet, Take 1 tablet (10 mg total) by mouth daily., Disp: 90 tablet, Rfl: 3 .  aspirin 81 MG tablet, Take 81 mg by mouth daily., Disp: , Rfl:  .  atorvastatin (LIPITOR) 10 MG tablet, Take 1 tablet (10 mg total) by mouth daily., Disp: 90 tablet, Rfl: 3 .  lisinopril (PRINIVIL,ZESTRIL) 10 MG tablet, Take 1 tablet (10 mg total) by mouth daily., Disp: 90 tablet, Rfl: 3 .  traZODone (DESYREL) 50 MG tablet, Take 0.5-1 tablets (25-50 mg total) by mouth at bedtime as needed for sleep., Disp: 90 tablet, Rfl: 3 .  baclofen (LIORESAL) 10 MG tablet, Take 1 tablet (10 mg total) by mouth 3 (three) times daily., Disp: 90 each, Rfl: 1  Review of Systems  Constitutional: Positive for fatigue. Negative for activity change, appetite change, chills, diaphoresis, fever and unexpected weight change.  Respiratory: Negative for shortness of breath.   Cardiovascular: Positive for leg swelling. Negative for chest pain and palpitations.  Musculoskeletal: Positive for neck pain. Negative for neck stiffness.    Social History   Tobacco Use  . Smoking status: Former Smoker    Packs/day: 0.50    Years: 6.00    Pack years: 3.00    Last attempt to quit: 01/24/1997    Years since quitting: 20.1  . Smokeless tobacco: Never Used  Substance Use Topics  . Alcohol use: No   Objective:   BP 134/76 (BP Location: Left Arm, Patient Position: Sitting, Cuff Size: Large)   Pulse  80   Temp 98.2 F (36.8 C) (Oral)   Resp 16   Wt 215 lb (97.5 kg)   SpO2 98%   BMI 36.90 kg/m  Vitals:   03/09/17 1105  BP: 134/76  Pulse: 80  Resp: 16  Temp: 98.2 F (36.8 C)  TempSrc: Oral  SpO2: 98%  Weight: 215 lb (97.5 kg)     Physical Exam  Constitutional: She is oriented to person, place, and time. She appears well-developed and well-nourished. No  distress.  HENT:  Head: Normocephalic and atraumatic.  Eyes: Conjunctivae are normal. No scleral icterus.  Cardiovascular: Normal rate, regular rhythm, normal heart sounds and intact distal pulses.  No murmur heard. Pulmonary/Chest: Effort normal and breath sounds normal. No respiratory distress. She has no wheezes. She has no rales.  Abdominal: Soft. She exhibits no distension. There is no tenderness.  Genitourinary:  Genitourinary Comments: GYN:  External genitalia within normal limits.  Vaginal mucosa pink, moist, normal rugae.  Nonfriable vaginal cuff without lesions, no bleeding noted on speculum exam.  Bimanual exam revealed surgically absent utrerus. No adnexal masses bilaterally.    Musculoskeletal: She exhibits no edema.  Neck ROM limited in L lateral flexion.  Tightness of L sided musculature. No midline TTP  Neurological: She is alert and oriented to person, place, and time.  Skin: Skin is warm and dry. No rash noted.  Psychiatric: She has a normal mood and affect. Her behavior is normal.  Vitals reviewed.      Assessment & Plan:   Problem List Items Addressed This Visit      Cardiovascular and Mediastinum   Essential (primary) hypertension - Primary    Well controlled Continue amlodipine and lisinopril at current doses Refills sent Recent labs reviewed, Cr and electrolytes wnl F/u in 6 months      Relevant Medications   aspirin 81 MG tablet   amLODipine (NORVASC) 10 MG tablet   atorvastatin (LIPITOR) 10 MG tablet   lisinopril (PRINIVIL,ZESTRIL) 10 MG tablet     Musculoskeletal and Integument   Muscle spasms of neck    Discussed ice/heat Can continue tylenol prn Trial of baclofen for muscle relaxant HEP with stretching given Return precautions discussed        Other   Personal history of malignant neoplasm of cervix uteri    Asymptomatic Obtained pap smear today      Pure hypercholesterolemia    Continue lipitor - refill sent      Relevant  Medications   aspirin 81 MG tablet   amLODipine (NORVASC) 10 MG tablet   atorvastatin (LIPITOR) 10 MG tablet   lisinopril (PRINIVIL,ZESTRIL) 10 MG tablet   Insomnia   Relevant Medications   traZODone (DESYREL) 50 MG tablet    Other Visit Diagnoses    Screening for vaginal cancer       Relevant Orders   Pap IG and HPV (high risk) DNA detection       Return in about 6 months (around 09/06/2017) for chronic disease f/u.   The entirety of the information documented in the History of Present Illness, Review of Systems and Physical Exam were personally obtained by me. Portions of this information were initially documented by Irving BurtonEmily Ratchford, CMA and reviewed by me for thoroughness and accuracy.    Erasmo DownerBacigalupo, Angela M, MD, MPH [facility] 03/09/2017 1:46 PM

## 2017-03-09 NOTE — Assessment & Plan Note (Signed)
Asymptomatic Obtained pap smear today

## 2017-03-12 LAB — PAP IG AND HPV HIGH-RISK
HPV, high-risk: NEGATIVE
PAP SMEAR COMMENT: 0

## 2017-03-14 ENCOUNTER — Telehealth: Payer: Self-pay

## 2017-03-14 NOTE — Telephone Encounter (Signed)
Pt advised.

## 2017-03-14 NOTE — Telephone Encounter (Signed)
-----   Message from Erasmo DownerAngela M Bacigalupo, MD sent at 03/14/2017  8:22 AM EST ----- Normal pap smear. HPV negative  Bacigalupo, Marzella SchleinAngela M, MD, MPH [facility] 03/14/2017 8:22 AM

## 2017-04-01 ENCOUNTER — Other Ambulatory Visit: Payer: Self-pay | Admitting: Family Medicine

## 2017-04-01 NOTE — Telephone Encounter (Signed)
Requesting 90 day supply.

## 2017-04-01 NOTE — Telephone Encounter (Signed)
[facility] pharmacy faxed a refill request for a 90-days supply for the following medication. Thanks CC  baclofen (LIORESAL) 10 MG tablet

## 2017-04-04 MED ORDER — BACLOFEN 10 MG PO TABS: 10.0000 mg | ORAL_TABLET | Freq: Three times a day (TID) | ORAL | 1 refills | Status: DC

## 2017-04-20 DIAGNOSIS — M545 Low back pain: Secondary | ICD-10-CM | POA: Diagnosis not present

## 2017-04-20 DIAGNOSIS — M4696 Unspecified inflammatory spondylopathy, lumbar region: Secondary | ICD-10-CM | POA: Diagnosis not present

## 2017-04-21 DIAGNOSIS — M19049 Primary osteoarthritis, unspecified hand: Secondary | ICD-10-CM | POA: Diagnosis not present

## 2017-04-22 DIAGNOSIS — M19049 Primary osteoarthritis, unspecified hand: Secondary | ICD-10-CM | POA: Diagnosis not present

## 2017-08-23 ENCOUNTER — Ambulatory Visit
Admission: RE | Admit: 2017-08-23 | Discharge: 2017-08-23 | Disposition: A | Discharge status: Home / Self Care | Payer: Medicare HMO | Source: Ambulatory Visit | Attending: Physician Assistant | Admitting: Physician Assistant

## 2017-08-23 ENCOUNTER — Ambulatory Visit (INDEPENDENT_AMBULATORY_CARE_PROVIDER_SITE_OTHER): Payer: Medicare HMO | Admitting: Physician Assistant

## 2017-08-23 ENCOUNTER — Encounter: Payer: Self-pay | Admitting: Physician Assistant

## 2017-08-23 VITALS — BP 176/92 | HR 110 | Temp 98.2°F | Wt 195.0 lb

## 2017-08-23 DIAGNOSIS — M545 Low back pain: Secondary | ICD-10-CM

## 2017-08-23 DIAGNOSIS — R319 Hematuria, unspecified: Secondary | ICD-10-CM | POA: Insufficient documentation

## 2017-08-23 DIAGNOSIS — R109 Unspecified abdominal pain: Secondary | ICD-10-CM | POA: Diagnosis not present

## 2017-08-23 DIAGNOSIS — R110 Nausea: Secondary | ICD-10-CM

## 2017-08-23 LAB — POCT URINALYSIS DIPSTICK
Bilirubin, UA: NEGATIVE
Glucose, UA: NEGATIVE
Ketones, UA: NEGATIVE
Nitrite, UA: NEGATIVE
Protein, UA: POSITIVE — AB
Spec Grav, UA: >=1.03 — AB (ref 1.010–1.025)
Urobilinogen, UA: 0.2 E.U./dL
pH, UA: 6 (ref 5.0–8.0)

## 2017-08-23 MED ORDER — TAMSULOSIN HCL 0.4 MG PO CAPS: 0.4000 mg | ORAL_CAPSULE | Freq: Every day | ORAL | 0 refills | Status: DC

## 2017-08-23 MED ORDER — PROMETHAZINE HCL 12.5 MG PO TABS
12.5000 mg | ORAL_TABLET | Freq: Three times a day (TID) | ORAL | 0 refills | Status: DC | PRN
Start: 2017-08-23 — End: 2017-08-24

## 2017-08-23 MED ORDER — OXYCODONE-ACETAMINOPHEN 5-325 MG PO TABS: 1.0000 | ORAL_TABLET | ORAL | 0 refills | Status: AC | PRN

## 2017-08-23 NOTE — Progress Notes (Signed)
Patient: Whitney Stuart Nebel Female    DOB: [birth year]   67 y.o.   MRN: [id number] Visit Date: 08/23/2017  Today's Provider: Trey SailorsAdriana M Leshaun Biebel, PA-C   Chief Complaint  Patient presents with  . Back Pain    Started about two days ago.   Subjective:    Whitney Stuart [place] presents with left sided back pain x 3 days. Her family history is significant for kidney stones. Pertinents below. She has had a complete hysterectomy and denies any vaginal bleeding. Denies fevers and vomiting.  Back Pain  This is a new problem. The current episode started in the past 7 days. The problem occurs constantly. The problem has been gradually worsening since onset. The pain is present in the lumbar spine (Lower left pain). The quality of the pain is described as aching. Radiates to: Radiates to left lower abdomen. Associated symptoms include leg pain (Muscle spasms in both legs.). Pertinent negatives include no abdominal pain, bladder incontinence, bowel incontinence, chest pain, dysuria, fever, headaches, numbness, paresis, paresthesias, pelvic pain, perianal numbness, tingling, weakness or weight loss. She has tried NSAIDs for the symptoms. The treatment provided no relief.       No Known Allergies   Current Outpatient Medications:  .  amLODipine (NORVASC) 10 MG tablet, Take 1 tablet (10 mg total) by mouth daily., Disp: 90 tablet, Rfl: 3 .  aspirin 81 MG tablet, Take 81 mg by mouth daily., Disp: , Rfl:  .  atorvastatin (LIPITOR) 10 MG tablet, Take 1 tablet (10 mg total) by mouth daily., Disp: 90 tablet, Rfl: 3 .  baclofen (LIORESAL) 10 MG tablet, Take 1 tablet (10 mg total) by mouth 3 (three) times daily., Disp: 180 each, Rfl: 1 .  lisinopril (PRINIVIL,ZESTRIL) 10 MG tablet, Take 1 tablet (10 mg total) by mouth daily., Disp: 90 tablet, Rfl: 3 .  traZODone (DESYREL) 50 MG tablet, Take 0.5-1 tablets (25-50 mg total) by mouth at bedtime as needed for sleep., Disp: 90 tablet, Rfl: 3 .  oxyCODONE-acetaminophen  (PERCOCET/ROXICET) 5-325 MG tablet, Take 1 tablet by mouth every 4 (four) hours as needed for up to 5 days for severe pain., Disp: 24 tablet, Rfl: 0 .  promethazine (PHENERGAN) 12.5 MG tablet, Take 1 tablet (12.5 mg total) by mouth every 8 (eight) hours as needed for nausea or vomiting., Disp: 20 tablet, Rfl: 0 .  tamsulosin (FLOMAX) 0.4 MG CAPS capsule, Take 1 capsule (0.4 mg total) by mouth daily., Disp: 30 capsule, Rfl: 0  Review of Systems  Constitutional: Negative for fever and weight loss.  Cardiovascular: Negative for chest pain.  Gastrointestinal: Negative for abdominal pain and bowel incontinence.  Genitourinary: Negative for bladder incontinence, dysuria and pelvic pain.  Musculoskeletal: Positive for back pain.  Neurological: Negative for tingling, weakness, numbness, headaches and paresthesias.    Social History   Tobacco Use  . Smoking status: Former Smoker    Packs/day: 0.50    Years: 6.00    Pack years: 3.00    Last attempt to quit: 01/24/1997    Years since quitting: 20.5  . Smokeless tobacco: Never Used  Substance Use Topics  . Alcohol use: No   Objective:   BP (!) 176/92 (BP Location: Right Arm, Patient Position: Sitting, Cuff Size: Normal)   Pulse (!) 110   Temp 98.2 F (36.8 C) (Oral)   Wt 195 lb (88.5 kg)   SpO2 98%   BMI 33.47 kg/m  Vitals:   08/23/17 1240  BP: Marland Kitchen(!)  176/92  Pulse: (!) 110  Temp: 98.2 F (36.8 C)  TempSrc: Oral  SpO2: 98%  Weight: 195 lb (88.5 kg)     Physical Exam  Constitutional: She is oriented to person, place, and time. She appears well-developed and well-nourished.  Cardiovascular: Normal rate and regular rhythm.  Pulmonary/Chest: Effort normal and breath sounds normal.  Abdominal: Soft. Bowel sounds are normal. She exhibits no distension and no mass. There is no tenderness. There is CVA tenderness. There is no guarding.  Some slight left sided CVA tenderness  Neurological: She is alert and oriented to person, place, and  time.  Skin: Skin is warm and dry.  Psychiatric: She has a normal mood and affect. Her behavior is normal.        Assessment & Plan:     1. Acute left-sided low back pain, with sciatica presence unspecified  Urinalysis showed some leukocytes and trace blood, will send for culture but not overly suspicious of infectious process. She is having regular bowel movements. She has had complete hysterectomy and does not have any reproductive organs. Suspicious for renal stone. Will start with KUB and treatment as below. If KUB negative, will get CT Renal stone study urgently. If patient's condition worsens, she should be seen in the ER. Can take pain medication every 4-6 hours.   - POCT urinalysis dipstick - CULTURE, URINE COMPREHENSIVE - DG Abd 1 View; Future - tamsulosin (FLOMAX) 0.4 MG CAPS capsule; Take 1 capsule (0.4 mg total) by mouth daily.  Dispense: 30 capsule; Refill: 0 - oxyCODONE-acetaminophen (PERCOCET/ROXICET) 5-325 MG tablet; Take 1 tablet by mouth every 4 (four) hours as needed for up to 5 days for severe pain.  Dispense: 24 tablet; Refill: 0  2. Hematuria, unspecified type  - CULTURE, URINE COMPREHENSIVE - DG Abd 1 View; Future  3. Nausea  - promethazine (PHENERGAN) 12.5 MG tablet; Take 1 tablet (12.5 mg total) by mouth every 8 (eight) hours as needed for nausea or vomiting.  Dispense: 20 tablet; Refill: 0  Return if symptoms worsen or fail to improve.  The entirety of the information documented in the History of Present Illness, Review of Systems and Physical Exam were personally obtained by me. Portions of this information were initially documented by Kavin Leech, CMA and reviewed by me for thoroughness and accuracy.        Trey Sailors, PA-C  [facility]

## 2017-08-23 NOTE — Patient Instructions (Signed)

## 2017-08-24 ENCOUNTER — Telehealth: Payer: Self-pay | Admitting: Physician Assistant

## 2017-08-24 ENCOUNTER — Other Ambulatory Visit: Payer: Self-pay | Admitting: Physician Assistant

## 2017-08-24 ENCOUNTER — Emergency Department
Admission: EM | Admit: 2017-08-24 | Discharge: 2017-08-24 | Disposition: A | Discharge status: Home / Self Care | Payer: Medicare HMO | Attending: Emergency Medicine | Admitting: Emergency Medicine

## 2017-08-24 ENCOUNTER — Telehealth: Payer: Self-pay

## 2017-08-24 ENCOUNTER — Encounter: Payer: Self-pay | Admitting: *Deleted

## 2017-08-24 ENCOUNTER — Ambulatory Visit
Admission: RE | Admit: 2017-08-24 | Discharge: 2017-08-24 | Disposition: A | Discharge status: Home / Self Care | Payer: Medicare HMO | Source: Ambulatory Visit | Attending: Physician Assistant | Admitting: Physician Assistant

## 2017-08-24 ENCOUNTER — Other Ambulatory Visit: Payer: Self-pay

## 2017-08-24 DIAGNOSIS — N390 Urinary tract infection, site not specified: Secondary | ICD-10-CM | POA: Diagnosis not present

## 2017-08-24 DIAGNOSIS — I10 Essential (primary) hypertension: Secondary | ICD-10-CM | POA: Diagnosis not present

## 2017-08-24 DIAGNOSIS — N1330 Unspecified hydronephrosis: Secondary | ICD-10-CM

## 2017-08-24 DIAGNOSIS — M545 Low back pain: Secondary | ICD-10-CM | POA: Diagnosis not present

## 2017-08-24 DIAGNOSIS — N281 Cyst of kidney, acquired: Secondary | ICD-10-CM

## 2017-08-24 DIAGNOSIS — R1032 Left lower quadrant pain: Secondary | ICD-10-CM

## 2017-08-24 DIAGNOSIS — Z7982 Long term (current) use of aspirin: Secondary | ICD-10-CM | POA: Insufficient documentation

## 2017-08-24 DIAGNOSIS — Z87891 Personal history of nicotine dependence: Secondary | ICD-10-CM | POA: Diagnosis not present

## 2017-08-24 DIAGNOSIS — Z79899 Other long term (current) drug therapy: Secondary | ICD-10-CM | POA: Insufficient documentation

## 2017-08-24 DIAGNOSIS — J45909 Unspecified asthma, uncomplicated: Secondary | ICD-10-CM | POA: Diagnosis not present

## 2017-08-24 DIAGNOSIS — R109 Unspecified abdominal pain: Secondary | ICD-10-CM | POA: Diagnosis not present

## 2017-08-24 DIAGNOSIS — I700 Atherosclerosis of aorta: Secondary | ICD-10-CM | POA: Insufficient documentation

## 2017-08-24 DIAGNOSIS — R110 Nausea: Secondary | ICD-10-CM

## 2017-08-24 LAB — URINALYSIS, COMPLETE (UACMP) WITH MICROSCOPIC
Bilirubin Urine: NEGATIVE
Glucose, UA: NEGATIVE mg/dL
Hgb urine dipstick: NEGATIVE
Ketones, ur: NEGATIVE mg/dL
Nitrite: NEGATIVE
Protein, ur: 30 mg/dL — AB
SPECIFIC GRAVITY, URINE: 1.021 (ref 1.005–1.030)
pH: 5 (ref 5.0–8.0)

## 2017-08-24 LAB — COMPREHENSIVE METABOLIC PANEL
ALK PHOS: 60 U/L (ref 38–126)
ALT: 44 U/L (ref 0–44)
AST: 49 U/L — ABNORMAL HIGH (ref 15–41)
Albumin: 4.1 g/dL (ref 3.5–5.0)
Anion gap: 9 (ref 5–15)
BILIRUBIN TOTAL: 0.7 mg/dL (ref 0.3–1.2)
BUN: 15 mg/dL (ref 8–23)
CALCIUM: 9.1 mg/dL (ref 8.9–10.3)
CO2: 25 mmol/L (ref 22–32)
CREATININE: 1.18 mg/dL — AB (ref 0.44–1.00)
Chloride: 103 mmol/L (ref 98–111)
GFR calc non Af Amer: 47 mL/min — ABNORMAL LOW (ref >60)
GFR, EST AFRICAN AMERICAN: 54 mL/min — AB (ref >60)
GLUCOSE: 141 mg/dL — AB (ref 70–99)
Potassium: 3.7 mmol/L (ref 3.5–5.1)
SODIUM: 137 mmol/L (ref 135–145)
TOTAL PROTEIN: 7.9 g/dL (ref 6.5–8.1)

## 2017-08-24 LAB — CBC
HCT: 36.4 % (ref 35.0–47.0)
Hemoglobin: 12.8 g/dL (ref 12.0–16.0)
MCH: 31.4 pg (ref 26.0–34.0)
MCHC: 35.1 g/dL (ref 32.0–36.0)
MCV: 89.4 fL (ref 80.0–100.0)
PLATELETS: 325 10*3/uL (ref 150–440)
RBC: 4.07 MIL/uL (ref 3.80–5.20)
RDW: 12.7 % (ref 11.5–14.5)
WBC: 7.9 10*3/uL (ref 3.6–11.0)

## 2017-08-24 LAB — LIPASE, BLOOD: Lipase: 71 U/L — ABNORMAL HIGH (ref 11–51)

## 2017-08-24 MED ORDER — KETOROLAC TROMETHAMINE 30 MG/ML IJ SOLN
15.0000 mg | INTRAMUSCULAR | Status: AC
  Administered 2017-08-24: 15 mg via INTRAVENOUS
  Filled 2017-08-24: qty 1

## 2017-08-24 MED ORDER — CEPHALEXIN 500 MG PO CAPS: 500.0000 mg | ORAL_CAPSULE | Freq: Two times a day (BID) | ORAL | 0 refills | Status: DC

## 2017-08-24 MED ORDER — PROMETHAZINE HCL 12.5 MG PO TABS: 12.5000 mg | ORAL_TABLET | Freq: Three times a day (TID) | ORAL | 0 refills | Status: DC | PRN

## 2017-08-24 MED ORDER — CEPHALEXIN 500 MG PO CAPS
500.0000 mg | ORAL_CAPSULE | Freq: Once | ORAL | Status: AC
Start: 2017-08-24 — End: 2017-08-24
  Administered 2017-08-24: 500 mg via ORAL
  Filled 2017-08-24: qty 1

## 2017-08-24 MED ORDER — KETOROLAC TROMETHAMINE 10 MG PO TABS: 10.0000 mg | ORAL_TABLET | Freq: Four times a day (QID) | ORAL | 0 refills | Status: DC | PRN

## 2017-08-24 MED ORDER — SODIUM CHLORIDE 0.9 % IV BOLUS
1000.0000 mL | Freq: Once | INTRAVENOUS | Status: AC
  Administered 2017-08-24: 1000 mL via INTRAVENOUS

## 2017-08-24 MED ORDER — ACETAMINOPHEN 500 MG PO TABS
1000.0000 mg | ORAL_TABLET | Freq: Once | ORAL | Status: AC
  Administered 2017-08-24: 1000 mg via ORAL
  Filled 2017-08-24: qty 2

## 2017-08-24 NOTE — ED Provider Notes (Signed)
[facility] Emergency Department Provider Note  ____________________________________________  Time seen: Approximately 8:16 PM  I have reviewed the triage vital signs and the nursing notes.   HISTORY  Chief Complaint Flank Pain    HPI Whitney Stuart is a 67 y.o. female with a history of hypertension and hyperlipidemia who complains of left flank pain that started yesterday.  Radiates to the left side.  Better laying on her left side, worse in any other position.  No other aggravating or alleviating factors.  Denies dysuria frequency urgency.  No hematuria.  Saw PCP who did not outpatient renal stone study CT abdomen which did not show any stones.  Patient comes to the ED due to her ongoing pain.  No fever chills or vomiting.  Pain is rated as severe.  She also is cleaning up tree branches 2 days ago but does not think that she has a low back strain.    Past Medical History:  Diagnosis Date  . Asthma   . Hyperlipidemia   . Hypertension      Patient Active Problem List   Diagnosis Date Noted  . Muscle spasms of neck 03/09/2017  . Obesity 02/09/2017  . Calculus of gallbladder 11/21/2014  . Arthralgia of hip 11/21/2014  . Insomnia 11/21/2014  . Pure hypercholesterolemia 04/11/2008  . Asthma, exogenous 03/28/2008  . Essential (primary) hypertension 03/28/2008  . Personal history of malignant neoplasm of cervix uteri 03/28/2008     Past Surgical History:  Procedure Laterality Date  . ABDOMINAL HYSTERECTOMY  1999   total  . HAND SURGERY Bilateral 2003   carpal tunnel  . LIPOMA EXCISION       Prior to Admission medications   Medication Sig Start Date End Date Taking? Authorizing Provider  amLODipine (NORVASC) 10 MG tablet Take 1 tablet (10 mg total) by mouth daily. 03/09/17  Yes Bacigalupo, Marzella Schlein, MD  aspirin 81 MG tablet Take 81 mg by mouth daily.   Yes [provider]  atorvastatin (LIPITOR) 10 MG tablet Take 1 tablet (10 mg  total) by mouth daily. 03/09/17  Yes Bacigalupo, Marzella Schlein, MD  lisinopril (PRINIVIL,ZESTRIL) 10 MG tablet Take 1 tablet (10 mg total) by mouth daily. 03/09/17  Yes Bacigalupo, Marzella Schlein, MD  oxyCODONE-acetaminophen (PERCOCET/ROXICET) 5-325 MG tablet Take 1 tablet by mouth every 4 (four) hours as needed for up to 5 days for severe pain. 08/23/17 08/28/17 Yes Trey Sailors, PA-C  promethazine (PHENERGAN) 12.5 MG tablet Take 1 tablet (12.5 mg total) by mouth every 8 (eight) hours as needed for nausea or vomiting. 08/24/17  Yes Osvaldo Angst M, PA-C  tamsulosin (FLOMAX) 0.4 MG CAPS capsule Take 1 capsule (0.4 mg total) by mouth daily. 08/23/17  Yes Trey Sailors, PA-C  traZODone (DESYREL) 50 MG tablet Take 0.5-1 tablets (25-50 mg total) by mouth at bedtime as needed for sleep. Patient taking differently: Take 50 mg by mouth at bedtime as needed for sleep.  03/09/17  Yes Bacigalupo, Marzella Schlein, MD  baclofen (LIORESAL) 10 MG tablet Take 1 tablet (10 mg total) by mouth 3 (three) times daily. Patient not taking: Reported on 08/24/2017 04/04/17   Erasmo Downer, MD  cephALEXin (KEFLEX) 500 MG capsule Take 1 capsule (500 mg total) by mouth 2 (two) times daily. 08/24/17   Sharman Cheek, MD  ketorolac (TORADOL) 10 MG tablet Take 1 tablet (10 mg total) by mouth every 6 (six) hours as needed for moderate pain. 08/24/17   Sharman Cheek, MD  Allergies Patient has no known allergies.   Family History  Problem Relation Age of Onset  . Hypertension Father   . Kidney disease Father   . Heart disease Mother   . Stroke Mother   . Congestive Heart Failure Mother   . Hypertension Mother   . Heart murmur Sister   . Hypertension Sister   . Lung cancer Brother   . Hypertension Brother   . Hypertension Brother   . Sleep apnea Brother   . Mental illness Maternal Aunt   . Cancer Sister        ovarian  . Hypertension Sister   . Hypertension Sister   . Hypertension Brother   . Heart disease Brother    . Hypertension Brother   . Hypertension Brother   . Hypertension Brother   . Hypertension Brother   . Hypertension Brother   . Colon cancer Neg Hx   . Breast cancer Neg Hx     Social History Social History   Tobacco Use  . Smoking status: Former Smoker    Packs/day: 0.50    Years: 6.00    Pack years: 3.00    Last attempt to quit: 01/24/1997    Years since quitting: 20.5  . Smokeless tobacco: Never Used  Substance Use Topics  . Alcohol use: No  . Drug use: No    Review of Systems  Constitutional:   No fever or chills.  Cardiovascular:   No chest pain or syncope. Respiratory:   No dyspnea or cough. Gastrointestinal:   Negative for abdominal pain, vomiting and diarrhea.  Musculoskeletal:   Left low back pain as above  all other systems reviewed and are negative except as documented above in ROS and HPI.  ____________________________________________   PHYSICAL EXAM:  VITAL SIGNS: ED Triage Vitals  Enc Vitals Group     BP 08/24/17 1536 (!) 144/83     Pulse Rate 08/24/17 1536 91     Resp 08/24/17 1536 16     Temp 08/24/17 1536 98.2 F (36.8 C)     Temp Source 08/24/17 1536 Oral     SpO2 08/24/17 1536 98 %     Weight 08/24/17 1537 195 lb (88.5 kg)     Height 08/24/17 1537 5\' 5"  (1.651 m)     Head Circumference --      Peak Flow --      Pain Score 08/24/17 1537 9     Pain Loc --      Pain Edu? --      Excl. in GC? --     Vital signs reviewed, nursing assessments reviewed.   Constitutional:   Alert and oriented. Non-toxic appearance. Eyes:   Conjunctivae are normal. EOMI. PERRL. ENT      Head:   Normocephalic and atraumatic.      Nose:   No congestion/rhinnorhea.       Mouth/Throat:   MMM, no pharyngeal erythema. No peritonsillar mass.       Neck:   No meningismus. Full ROM. Hematological/Lymphatic/Immunilogical:   No cervical lymphadenopathy. Cardiovascular:   RRR. Symmetric bilateral radial and DP pulses.  No murmurs. Cap refill less than 2  seconds. Respiratory:   Normal respiratory effort without tachypnea/retractions. Breath sounds are clear and equal bilaterally. No wheezes/rales/rhonchi. Gastrointestinal:   Soft and nontender. Non distended. There is no CVA tenderness.  No rebound, rigidity, or guarding.  Musculoskeletal:   Normal range of motion in all extremities. No joint effusions.  No lower extremity tenderness.  No edema.  No midline spinal tenderness.  There is tenderness in the paraspinous musculature in the lumbar back on the left side which reproduces her pain. Neurologic:   Normal speech and language.  Motor grossly intact. No acute focal neurologic deficits are appreciated.  Skin:    Skin is warm, dry and intact. No rash noted.  No petechiae, purpura, or bullae.  ____________________________________________    LABS (pertinent positives/negatives) (all labs ordered are listed, but only abnormal results are displayed) Labs Reviewed  LIPASE, BLOOD - Abnormal; Notable for the following components:      Result Value   Lipase 71 (*)    All other components within normal limits  COMPREHENSIVE METABOLIC PANEL - Abnormal; Notable for the following components:   Glucose, Bld 141 (*)    Creatinine, Ser 1.18 (*)    AST 49 (*)    GFR calc non Af Amer 47 (*)    GFR calc Af Amer 54 (*)    All other components within normal limits  URINALYSIS, COMPLETE (UACMP) WITH MICROSCOPIC - Abnormal; Notable for the following components:   Color, Urine YELLOW (*)    APPearance HAZY (*)    Protein, ur 30 (*)    Leukocytes, UA LARGE (*)    Bacteria, UA FEW (*)    Non Squamous Epithelial 0-5 (*)    All other components within normal limits  URINE CULTURE  CBC   ____________________________________________   EKG    ____________________________________________    RADIOLOGY  Ct Renal Stone Study  Result Date: 08/24/2017 CLINICAL DATA:  3 day history of left flank pain with microscopic hematuria. EXAM: CT ABDOMEN AND  PELVIS WITHOUT CONTRAST TECHNIQUE: Multidetector CT imaging of the abdomen and pelvis was performed following the standard protocol without IV contrast. COMPARISON:  None. FINDINGS: Lower chest: Unremarkable Hepatobiliary: 6 mm low-density lesion in the dome of the left liver is too small to characterize. There is no evidence for gallstones, gallbladder wall thickening, or pericholecystic fluid. No intrahepatic or extrahepatic biliary dilation. Pancreas: No focal mass lesion. No dilatation of the main duct. No intraparenchymal cyst. No peripancreatic edema. Spleen: No splenomegaly. No focal mass lesion. Adrenals/Urinary Tract: No adrenal nodule or mass. 4 mm stone or vascular calcification noted in the right renal pelvis which duplicated right intrarenal collecting system. 5.7 cm interpolar water density lesion the left kidney is compatible with a cyst. Other smaller water density lesions in left kidney cannot be fully characterized without IV contrast per likely cysts. Mild to moderate left hydronephrosis noted with duplicated left intrarenal collecting system. No evidence for hydroureter. Bladder decompressed. Stomach/Bowel: Stomach is nondistended. No gastric wall thickening. No evidence of outlet obstruction. Duodenum is normally positioned as is the ligament of Treitz. No small bowel wall thickening. No small bowel dilatation. The terminal ileum is normal. The appendix is normal. No gross colonic mass. No colonic wall thickening. No substantial diverticular change. Vascular/Lymphatic: There is abdominal aortic atherosclerosis without aneurysm. There is no gastrohepatic or hepatoduodenal ligament lymphadenopathy. No intraperitoneal or retroperitoneal lymphadenopathy. No pelvic sidewall lymphadenopathy. Reproductive: Uterus surgically absent.  There is no adnexal mass. Other: No intraperitoneal free fluid. Musculoskeletal: Spondylolisthesis noted L4-5. No worrisome lytic or sclerotic osseous abnormality.  IMPRESSION: 1. Mild to moderate left hydronephrosis to the level of the UPJ with no associated hydroureter. No obstructing mineralized stone evident. Congenital UPJ obstruction be a consideration. 2. Left renal cysts. 3.  Aortic Atherosclerois (ICD10-170.0) Electronically Signed   By: Kennith Center M.D.   On:  08/24/2017 15:37    ____________________________________________   PROCEDURES Procedures  ____________________________________________  DIFFERENTIAL DIAGNOSIS   Lumbar strain, urinary tract infection, complicated renal cyst  CLINICAL IMPRESSION / ASSESSMENT AND PLAN / ED COURSE  Pertinent labs & imaging results that were available during my care of the patient were reviewed by me and considered in my medical decision making (see chart for details).      Clinical Course as of Aug 25 2014  Wed Aug 24, 2017  [id number] CT scan discussed with urology, Dr. 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  UA c/w UTI. Will treat and send culture given strange prsentation.    [PS]    Clinical Course User Index [PS] Sharman CheekStafford, Nickola Lenig, MD     ----------------------------------------- 8:19 PM on 08/24/2017 -----------------------------------------  Patient and family updated, will follow up with urology tomorrow.  Symptom control much improved with addition of Tylenol and Toradol.  ____________________________________________   FINAL CLINICAL IMPRESSION(S) / ED DIAGNOSES    Final diagnoses:  Lower urinary tract infectious disease  Left lower back pain   ED Discharge Orders        Ordered    cephALEXin (KEFLEX) 500 MG capsule  2 times daily     08/24/17 2015    ketorolac (TORADOL) 10 MG tablet  Every 6 hours PRN     08/24/17 2015      Portions of this note were generated with dragon dictation software. Dictation errors may occur despite best attempts at proofreading.    Sharman CheekStafford, Ednah Hammock, MD 08/24/17 2019

## 2017-08-24 NOTE — Telephone Encounter (Signed)
-----   Message from Trey SailorsAdriana M Pollak, [place]C sent at 08/24/2017  3:54 PM EDT ----- CT scan shows RIGHT renal stone that is non obstructing. Incidental finding of renal cyst. However, on the left side, the scan shows some hydronephrosis. This means the kidney is enlarged due to a back up of urine, but a stone is not visible on the scan. This may be due to an anatomical abnormality of the tube urine flows through, causing urine to back up into the kidney. I think this is likely the cause of her pain, and would like to refer her urgently to urology. She should continue flomax, pain and nausea medication. If she stops producing urine or displays any other signs we talked about earlier, she should be seen in the ER.

## 2017-08-24 NOTE — Telephone Encounter (Signed)
LMTCB 08/24/2017  Thanks,   -Laura  

## 2017-08-24 NOTE — Telephone Encounter (Signed)
Pt is requesting call back to discuss her Xray results. Please advise. Thanks TNP

## 2017-08-24 NOTE — ED Triage Notes (Signed)
Pt reporting pain in left flank and side. PT had a CT performed this afternoon and is on pain medications but denies relief. Pain started in the lower left abd but is now mostly in the left lower back. No blood work done yet with PCP. Nausea no vomiting or diarrhea. Dizziness reported as well.

## 2017-08-24 NOTE — Telephone Encounter (Signed)
FYI

## 2017-08-24 NOTE — ED Notes (Signed)
ED Provider at bedside. 

## 2017-08-24 NOTE — Telephone Encounter (Signed)
Noted, thank you

## 2017-08-24 NOTE — Telephone Encounter (Signed)
-----   Message from Trey SailorsAdriana M Pollak, [place]PA-C sent at 08/24/2017  9:05 AM EDT ----- No definite urinary tract calcifications on XRAY. We can proceed with CT scan, how is she feeling?

## 2017-08-24 NOTE — Telephone Encounter (Signed)
Pt advised.  She is still having back pain.  She reports that [facility] did not get the rx for Promethazine.  Please resend.     Thanks,   -Vernona RiegerLaura

## 2017-08-24 NOTE — Telephone Encounter (Signed)
Spoke with patient about results and will order CT renal study STAT. Have placed some labwork for her to get when she gets that study, she can get it at our clinic. [facility] Patient is feeling somewhat better with pain medications. Have instructed her to seek emergency treatment if pain becomes severe, she has a high fever, or intractable nausea or vomiting.

## 2017-08-24 NOTE — Telephone Encounter (Signed)
Pt's daughter returned call/  She wanted to let you know they are in the ER for lower back   Thanks teri

## 2017-08-25 ENCOUNTER — Telehealth: Payer: Self-pay

## 2017-08-25 ENCOUNTER — Encounter: Payer: Self-pay | Admitting: Urology

## 2017-08-25 ENCOUNTER — Ambulatory Visit: Payer: Medicare HMO | Admitting: Urology

## 2017-08-25 VITALS — BP 128/73 | HR 99 | Temp 98.0°F | Ht 65.0 in | Wt 198.6 lb

## 2017-08-25 DIAGNOSIS — Q625 Duplication of ureter: Secondary | ICD-10-CM

## 2017-08-25 DIAGNOSIS — R3129 Other microscopic hematuria: Secondary | ICD-10-CM

## 2017-08-25 DIAGNOSIS — Q6102 Congenital multiple renal cysts: Secondary | ICD-10-CM | POA: Diagnosis not present

## 2017-08-25 DIAGNOSIS — Q6211 Congenital occlusion of ureteropelvic junction: Secondary | ICD-10-CM

## 2017-08-25 DIAGNOSIS — Q6239 Other obstructive defects of renal pelvis and ureter: Secondary | ICD-10-CM

## 2017-08-25 NOTE — Telephone Encounter (Signed)
LMTCB

## 2017-08-25 NOTE — Telephone Encounter (Signed)
-----   Message from Trey SailorsAdriana M Pollak, [place] sent at 08/25/2017  8:13 AM EDT ----- Urine culture grew beta hemolytic strep. The colony count is a little low and frankly I'm not sure this represents a true infection as she did not have urinary tract symptoms, however, due to her presentation over the past few days she can continue the Keflex from the ER as that would work on this organism. Follow up with urology as instructed.

## 2017-08-25 NOTE — Telephone Encounter (Signed)
Patient advised. She verbalized understanding.  

## 2017-08-25 NOTE — Telephone Encounter (Signed)
Pt returned call ° °teri °

## 2017-08-25 NOTE — Progress Notes (Signed)
08/25/2017 1:47 PM   Whitney Stuart Jan 22, 1951 [id number]  Referring provider: Erasmo Downer, MD [address] [place], [place] [address]  Chief Complaint  Patient presents with  . Hospitalization Follow-up    HPI: Patient is a 67 year old Caucasian female who was referred by [facility] ED for nephrolithiasis with her daughter, Dewayne Hatch.     ED:  She presented to the ED on 08/24/2017 with the complaint of left flank pain that was radiating to her left side.  CT Renal stone study noted mild to moderate left hydronephrosis to the level of the UPJ with no associated hydroureter. No obstructing mineralized stone evident.  Congenital UPJ obstruction be a consideration.  Left renal cysts.  Aortic Atherosclerois.    Labs in the ED:  CBC 7.9.  Creatinine 1.18.  UA positive for 6-10 RBC's.  21-50 WBC's.     Meds given in the ED: Keflex po, Tylenol po, Toradol po, Toradol IM and Phenergan po.  Percocet and Flomax given by PCP prior to ED visit.    Prior urological history:  None   Current NSAID/anticoagulation:   Toradol IM, Toradol po    Today, she states that on Sunday she was just standing in the kitchen and experienced the sudden onset of LLQ that radiated to her left back.  She found relief laying on her left side but over the next day the pain increased to where laying on her left side did not provide relief.  She states standing and walking made the pain worse.   She went and saw her PCP and was given Percocet and Flomax.  The Percocet helps her lay on her left side now.  She has had two pills of Keflex.  Patient denies any gross hematuria, dysuria or suprapubic/flank pain.  She states that she is having "pain sweats" which she describes as chills and sweating.  She is also having nausea and vomiting.    She is now having left lower back pain.    Now she is having left lower back pain when she stands and sits.  She has had muscle cramping down the back of her legs from time to  time.    She has been drinking a lot of water and has nocturia and this is baseline.     PMH: Past Medical History:  Diagnosis Date  . Arthritis   . Asthma   . Asthma   . GERD (gastroesophageal reflux disease)   . History of cervical cancer   . Hyperlipidemia   . Hypertension     Surgical History: Past Surgical History:  Procedure Laterality Date  . ABDOMINAL HYSTERECTOMY  1999   total  . HAND SURGERY Bilateral 2003   carpal tunnel  . LIPOMA EXCISION      Home Medications:  Allergies as of 08/25/2017   No Known Allergies     Medication List        Accurate as of 08/25/17  1:47 PM. Always use your most recent med list.          amLODipine 10 MG tablet Commonly known as:  NORVASC Take 1 tablet (10 mg total) by mouth daily.   aspirin 81 MG tablet Take 81 mg by mouth daily.   atorvastatin 10 MG tablet Commonly known as:  LIPITOR Take 1 tablet (10 mg total) by mouth daily.   cephALEXin 500 MG capsule Commonly known as:  KEFLEX Take 1 capsule (500 mg total) by mouth 2 (two) times daily.  ketorolac 10 MG tablet Commonly known as:  TORADOL Take 1 tablet (10 mg total) by mouth every 6 (six) hours as needed for moderate pain.   lisinopril 10 MG tablet Commonly known as:  PRINIVIL,ZESTRIL Take 1 tablet (10 mg total) by mouth daily.   oxyCODONE-acetaminophen 5-325 MG tablet Commonly known as:  PERCOCET/ROXICET Take 1 tablet by mouth every 4 (four) hours as needed for up to 5 days for severe pain.   promethazine 12.5 MG tablet Commonly known as:  PHENERGAN Take 1 tablet (12.5 mg total) by mouth every 8 (eight) hours as needed for nausea or vomiting.   tamsulosin 0.4 MG Caps capsule Commonly known as:  FLOMAX Take 1 capsule (0.4 mg total) by mouth daily.   traZODone 50 MG tablet Commonly known as:  DESYREL Take 0.5-1 tablets (25-50 mg total) by mouth at bedtime as needed for sleep.       Allergies: No Known Allergies  Family History: Family History    Problem Relation Age of Onset  . Hypertension Father   . Kidney disease Father   . Heart disease Mother   . Stroke Mother   . Congestive Heart Failure Mother   . Hypertension Mother   . Heart murmur Sister   . Hypertension Sister   . Lung cancer Brother   . Hypertension Brother   . Hypertension Brother   . Sleep apnea Brother   . Mental illness Maternal Aunt   . Cancer Sister        ovarian  . Hypertension Sister   . Hypertension Sister   . Hypertension Brother   . Heart disease Brother   . Hypertension Brother   . Hypertension Brother   . Hypertension Brother   . Hypertension Brother   . Hypertension Brother   . Colon cancer Neg Hx   . Breast cancer Neg Hx     Social History:  reports that she quit smoking about 20 years ago. She has a 3.00 pack-year smoking history. She has never used smokeless tobacco. She reports that she does not drink alcohol or use drugs.  ROS: UROLOGY Frequent Urination?: No Hard to postpone urination?: No Burning/pain with urination?: No Get up at night to urinate?: Yes Leakage of urine?: No Urine stream starts and stops?: No Trouble starting stream?: No Do you have to strain to urinate?: No Blood in urine?: No Urinary tract infection?: Yes Sexually transmitted disease?: No Injury to kidneys or bladder?: No Painful intercourse?: No Weak stream?: No Currently pregnant?: No Vaginal bleeding?: No Last menstrual period?: Hysterectomy  Gastrointestinal Nausea?: Yes Vomiting?: No Indigestion/heartburn?: No Diarrhea?: No Constipation?: Yes  Constitutional Fever: No Night sweats?: Yes Weight loss?: Yes Fatigue?: Yes  Skin Skin rash/lesions?: No Itching?: No  Eyes Blurred vision?: No Double vision?: No  Ears/Nose/Throat Sore throat?: No Sinus problems?: No  Hematologic/Lymphatic Swollen glands?: No Easy bruising?: No  Cardiovascular Leg swelling?: Yes Chest pain?: No  Respiratory Cough?: No Shortness of breath?:  No  Endocrine Excessive thirst?: No  Musculoskeletal Back pain?: Yes Joint pain?: Yes  Neurological Headaches?: Yes Dizziness?: Yes  Psychologic Depression?: No Anxiety?: No  Physical Exam: BP 128/73 (BP Location: Left Arm, Patient Position: Sitting, Cuff Size: Large)   Pulse 99   Temp 98 F (36.7 C) (Other (Comment))   Ht 5\' 5"  (1.651 m)   Wt 198 lb 9.6 oz (90.1 kg)   BMI 33.05 kg/m   Constitutional:  Well nourished. Alert and oriented, No acute distress. HEENT: [place] AT, moist mucus  membranes.  Trachea midline, no masses. Cardiovascular: No clubbing, cyanosis, or edema. Respiratory: Normal respiratory effort, no increased work of breathing. GI: Abdomen is soft, non tender, non distended, no abdominal masses. Liver and spleen not palpable.  No hernias appreciated.  Stool sample for occult testing is not indicated.   GU: No CVA tenderness.  No bladder fullness or masses.   Skin: No rashes, bruises or suspicious lesions. Lymph: No cervical or inguinal adenopathy. Neurologic: Grossly intact, no focal deficits, moving all 4 extremities. Psychiatric: Normal mood and affect.  Laboratory Data: Lab Results  Component Value Date   WBC 7.9 08/24/2017   HGB 12.8 08/24/2017   HCT 36.4 08/24/2017   MCV 89.4 08/24/2017   PLT 325 08/24/2017    Lab Results  Component Value Date   CREATININE 1.18 (H) 08/24/2017    No results found for: PSA  No results found for: TESTOSTERONE  No results found for: HGBA1C  Lab Results  Component Value Date   TSH 0.888 11/22/2014       Component Value Date/Time   CHOL 164 02/09/2017 1125   HDL 58 02/09/2017 1125   CHOLHDL 2.8 02/09/2017 1125   LDLCALC 88 02/09/2017 1125    Lab Results  Component Value Date   AST 49 (H) 08/24/2017   Lab Results  Component Value Date   ALT 44 08/24/2017   No components found for: ALKALINEPHOPHATASE No components found for: BILIRUBINTOTAL  No results found for: ESTRADIOL  Urinalysis 6-10  RBC's.  21-50 WBC's.  See Epic.    I have reviewed the labs.   Pertinent Imaging: CLINICAL DATA:  3 day history of left flank pain with microscopic hematuria.  EXAM: CT ABDOMEN AND PELVIS WITHOUT CONTRAST  TECHNIQUE: Multidetector CT imaging of the abdomen and pelvis was performed following the standard protocol without IV contrast.  COMPARISON:  None.  FINDINGS: Lower chest: Unremarkable  Hepatobiliary: 6 mm low-density lesion in the dome of the left liver is too small to characterize. There is no evidence for gallstones, gallbladder wall thickening, or pericholecystic fluid. No intrahepatic or extrahepatic biliary dilation.  Pancreas: No focal mass lesion. No dilatation of the main duct. No intraparenchymal cyst. No peripancreatic edema.  Spleen: No splenomegaly. No focal mass lesion.  Adrenals/Urinary Tract: No adrenal nodule or mass. 4 mm stone or vascular calcification noted in the right renal pelvis which duplicated right intrarenal collecting system. 5.7 cm interpolar water density lesion the left kidney is compatible with a cyst. Other smaller water density lesions in left kidney cannot be fully characterized without IV contrast per likely cysts. Mild to moderate left hydronephrosis noted with duplicated left intrarenal collecting system. No evidence for hydroureter. Bladder decompressed.  Stomach/Bowel: Stomach is nondistended. No gastric wall thickening. No evidence of outlet obstruction. Duodenum is normally positioned as is the ligament of Treitz. No small bowel wall thickening. No small bowel dilatation. The terminal ileum is normal. The appendix is normal. No gross colonic mass. No colonic wall thickening. No substantial diverticular change.  Vascular/Lymphatic: There is abdominal aortic atherosclerosis without aneurysm. There is no gastrohepatic or hepatoduodenal ligament lymphadenopathy. No intraperitoneal or retroperitoneal lymphadenopathy.  No pelvic sidewall lymphadenopathy.  Reproductive: Uterus surgically absent.  There is no adnexal mass.  Other: No intraperitoneal free fluid.  Musculoskeletal: Spondylolisthesis noted L4-5. No worrisome lytic or sclerotic osseous abnormality.  IMPRESSION: 1. Mild to moderate left hydronephrosis to the level of the UPJ with no associated hydroureter. No obstructing mineralized stone evident. Congenital UPJ obstruction be a  consideration. 2. Left renal cysts. 3.  Aortic Atherosclerois (ICD10-170.0)   Electronically Signed   By: Kennith Center M.D.   On: 08/24/2017 15:37 I have independently reviewed the films.    Assessment & Plan:    1. UPJ obstruction Likely congenital as it was present on abdominal ultrasound in 2010 Not the cause of her LBP  2. Microscopic hematuria Urine culture pending - will need hematuria work up if urine culture is negative  3. Renal cysts Explained findings to patient Likely benign  4. Duplicated renal systems Explained to patient that this is a normal variant  5. MSK pain Patient advised to contact PCP for further evaluation    Return for pending urine culture results .  These notes generated with voice recognition software. I apologize for typographical errors.  Michiel Cowboy, PA-C  [facility] [address]  [address] [place], [place] [address] [phone]

## 2017-08-26 LAB — URINE CULTURE

## 2017-08-26 LAB — CULTURE, URINE COMPREHENSIVE

## 2017-08-30 ENCOUNTER — Telehealth: Payer: Self-pay | Admitting: Urology

## 2017-08-30 NOTE — Telephone Encounter (Signed)
Please see urine culture from hospital and advise.

## 2017-08-30 NOTE — Telephone Encounter (Signed)
Pt called asking about results from tests that were done at the hospital.  She is waiting on Shannon to let her know what to do next.  Please call pt at [phone].

## 2017-08-31 NOTE — Telephone Encounter (Signed)
Mrs. Whitney Stuart grew out Strep B and the Keflex should have taken care of the infection.  We do need to recheck the urine next week to make sure that the infection has cleared.

## 2017-08-31 NOTE — Telephone Encounter (Signed)
Left message for pt to call back  °

## 2017-09-01 NOTE — Telephone Encounter (Signed)
Pt informed

## 2017-09-06 ENCOUNTER — Ambulatory Visit (INDEPENDENT_AMBULATORY_CARE_PROVIDER_SITE_OTHER): Payer: Medicare HMO | Admitting: Family Medicine

## 2017-09-06 ENCOUNTER — Encounter: Payer: Self-pay | Admitting: Family Medicine

## 2017-09-06 VITALS — BP 152/74 | HR 76 | Temp 98.1°F | Resp 16 | Wt 196.0 lb

## 2017-09-06 DIAGNOSIS — G4700 Insomnia, unspecified: Secondary | ICD-10-CM | POA: Diagnosis not present

## 2017-09-06 DIAGNOSIS — I10 Essential (primary) hypertension: Secondary | ICD-10-CM

## 2017-09-06 DIAGNOSIS — R3121 Asymptomatic microscopic hematuria: Secondary | ICD-10-CM | POA: Diagnosis not present

## 2017-09-06 DIAGNOSIS — R7989 Other specified abnormal findings of blood chemistry: Secondary | ICD-10-CM | POA: Diagnosis not present

## 2017-09-06 MED ORDER — TRAZODONE HCL 100 MG PO TABS: 100.0000 mg | ORAL_TABLET | Freq: Every evening | ORAL | 3 refills | Status: DC | PRN

## 2017-09-06 NOTE — Assessment & Plan Note (Signed)
Uncontrolled today, but patient has not taken her medications yet today Her blood pressure was well controlled at last several visits when she was taking her medication Continue amlodipine and lisinopril at current doses Reviewed recent BMP and noted slight elevation of creatinine, so we will check this in about 1 month Follow-up at CPE

## 2017-09-06 NOTE — Progress Notes (Signed)
Patient: Whitney Stuart Female    DOB: 19-Mar-1950   67 y.o.   MRN: [id number] Visit Date: 09/06/2017  Today's Provider: Shirlee LatchAngela Evalin Shawhan, MD   I, Joslyn HyEmily Ratchford, CMA, am acting as scribe for Shirlee LatchAngela Kerrilynn Derenzo, MD.  Chief Complaint  Patient presents with  . Hypertension   Subjective:    HPI      Hypertension, follow-up:  BP Readings from Last 3 Encounters:  09/06/17 (!) 152/74  08/25/17 128/73  08/24/17 140/80    She was last seen for hypertension 6 months ago.  BP at that visit was 134/76. Management since that visit includes continuing amlodipine 10 mg, and lisinopril 10 mg. She reports fair compliance with treatment. She states she has not taken her medications this morning. She is not having side effects.  She is exercising. Yard work. She is adherent to low salt diet.   Outside blood pressures are not being checked recently. She is experiencing fatigue and lower extremity edema.  Patient denies chest pain, chest pressure/discomfort, claudication, dyspnea, exertional chest pressure/discomfort, irregular heart beat, near-syncope, orthopnea, palpitations and syncope.   Cardiovascular risk factors include advanced age (older than 4555 for men, 5465 for women), dyslipidemia and hypertension.  Use of agents associated with hypertension: none.     Weight trend: stable Wt Readings from Last 3 Encounters:  09/06/17 196 lb (88.9 kg)  08/25/17 198 lb 9.6 oz (90.1 kg)  08/24/17 195 lb (88.5 kg)    Current diet: in general, a "healthy" diet    ------------------------------------------------------------------------  Follow up ER visit  Patient was seen in ER for flank pain on 07/231/2019. She was treated for UTI.  She also was found to have a congenital abnormality of her left UPJ and left renal cysts. Treatment for this included keflex and Toradol for pain. She reports good compliance with treatment. She reports this condition is  Improved.  ------------------------------------------------------------------------------------ Patient also states she has stopped taking her trazodone because she did not feel like it was working for her insomnia.  She is still having difficulty falling asleep and staying asleep.  She was only taking trazodone 25 to 50 mg nightly.  She has never tried another medication for this   No Known Allergies   Current Outpatient Medications:  .  amLODipine (NORVASC) 10 MG tablet, Take 1 tablet (10 mg total) by mouth daily., Disp: 90 tablet, Rfl: 3 .  aspirin 81 MG tablet, Take 81 mg by mouth daily., Disp: , Rfl:  .  atorvastatin (LIPITOR) 10 MG tablet, Take 1 tablet (10 mg total) by mouth daily., Disp: 90 tablet, Rfl: 3 .  lisinopril (PRINIVIL,ZESTRIL) 10 MG tablet, Take 1 tablet (10 mg total) by mouth daily., Disp: 90 tablet, Rfl: 3 .  traZODone (DESYREL) 100 MG tablet, Take 1 tablet (100 mg total) by mouth at bedtime as needed for sleep., Disp: 30 tablet, Rfl: 3  Review of Systems  Constitutional: Positive for fatigue. Negative for activity change, appetite change, chills, diaphoresis, fever and unexpected weight change.  Respiratory: Negative for shortness of breath.   Cardiovascular: Positive for leg swelling. Negative for chest pain and palpitations.  Genitourinary: Negative for dysuria.  Musculoskeletal: Negative for back pain.    Social History   Tobacco Use  . Smoking status: Former Smoker    Packs/day: 0.50    Years: 6.00    Pack years: 3.00    Last attempt to quit: 01/24/1997    Years since quitting: 20.6  . Smokeless tobacco:  Never Used  Substance Use Topics  . Alcohol use: No   Objective:   BP (!) 152/74 (BP Location: Left Arm, Patient Position: Sitting, Cuff Size: Normal)   Pulse 76   Temp 98.1 F (36.7 C) (Oral)   Resp 16   Wt 196 lb (88.9 kg)   BMI 32.62 kg/m  Vitals:   09/06/17 1132  BP: (!) 152/74  Pulse: 76  Resp: 16  Temp: 98.1 F (36.7 C)  TempSrc:  Oral  Weight: 196 lb (88.9 kg)     Physical Exam  Constitutional: She is oriented to person, place, and time. She appears well-developed and well-nourished. No distress.  HENT:  Head: Normocephalic and atraumatic.  Eyes: Conjunctivae are normal. No scleral icterus.  Cardiovascular: Normal rate, regular rhythm, normal heart sounds and intact distal pulses.  No murmur heard. Pulmonary/Chest: Effort normal and breath sounds normal. No respiratory distress. She has no wheezes. She has no rales.  Abdominal: Soft. She exhibits no distension. There is no tenderness. There is no rigidity, no guarding and no CVA tenderness.  Musculoskeletal: She exhibits no edema.  Neurological: She is alert and oriented to person, place, and time.  Skin: Skin is warm and dry. Capillary refill takes less than 2 seconds. No rash noted.  Psychiatric: She has a normal mood and affect. Her behavior is normal.  Vitals reviewed.      Assessment & Plan:   Problem List Items Addressed This Visit      Cardiovascular and Mediastinum   Essential (primary) hypertension - Primary    Uncontrolled today, but patient has not taken her medications yet today Her blood pressure was well controlled at last several visits when she was taking her medication Continue amlodipine and lisinopril at current doses Reviewed recent BMP and noted slight elevation of creatinine, so we will check this in about 1 month Follow-up at CPE      Relevant Orders   Basic Metabolic Panel (BMET)     Genitourinary   Asymptomatic microscopic hematuria    Noted to have microscopic hematuria 08/24/2017, but this was in the setting of UTI confirmed on urine culture Will recheck UA with micro in about 4 weeks to ensure that hematuria has resolved She is asymptomatic and finish her course of antibiotics for her UTI Her flank pain has much improved.      Relevant Orders   Urinalysis, Routine w reflex microscopic     Other   Insomnia     Uncontrolled Increase trazodone dose to 100 mg nightly      Relevant Medications   traZODone (DESYREL) 100 MG tablet    Other Visit Diagnoses    Elevated serum creatinine       Relevant Orders   Basic Metabolic Panel (BMET)       Return in about 4 months (around 01/06/2018) for CPE/AWV after 01/12/18.   The entirety of the information documented in the History of Present Illness, Review of Systems and Physical Exam were personally obtained by me. Portions of this information were initially documented by Irving BurtonEmily Ratchford, CMA and reviewed by me for thoroughness and accuracy.    Erasmo DownerBacigalupo, Cherysh Epperly M, MD, MPH [facility] 09/06/2017 11:59 AM

## 2017-09-06 NOTE — Assessment & Plan Note (Signed)
Uncontrolled Increase trazodone dose to 100 mg nightly

## 2017-09-06 NOTE — Assessment & Plan Note (Signed)
Noted to have microscopic hematuria 08/24/2017, but this was in the setting of UTI confirmed on urine culture Will recheck UA with micro in about 4 weeks to ensure that hematuria has resolved She is asymptomatic and finish her course of antibiotics for her UTI Her flank pain has much improved.

## 2017-09-06 NOTE — Patient Instructions (Signed)
Come back in about 4 weeks for repeat urine sample and labs - no appointment necessary

## 2017-09-13 NOTE — Telephone Encounter (Signed)
Would you reach out to Mrs. Katrinka BlazingSmith and remind the patient that we still need to check her urine?

## 2017-09-14 ENCOUNTER — Other Ambulatory Visit: Payer: Self-pay | Admitting: Physician Assistant

## 2017-09-14 DIAGNOSIS — M545 Low back pain: Secondary | ICD-10-CM

## 2017-09-14 NOTE — Telephone Encounter (Signed)
I spoke w/ pt, she states that her pcp told her she did not need to do another urine at this time because she recovered. Pt declined coming in, even though I advised her that these bacteria can commonly reoccur. Pt states she will call if she has sx's.

## 2017-09-19 DIAGNOSIS — J309 Allergic rhinitis, unspecified: Secondary | ICD-10-CM | POA: Diagnosis not present

## 2017-09-19 DIAGNOSIS — Z6833 Body mass index (BMI) 33.0-33.9, adult: Secondary | ICD-10-CM | POA: Diagnosis not present

## 2017-09-19 DIAGNOSIS — G4700 Insomnia, unspecified: Secondary | ICD-10-CM | POA: Diagnosis not present

## 2017-09-19 DIAGNOSIS — Z823 Family history of stroke: Secondary | ICD-10-CM | POA: Diagnosis not present

## 2017-09-19 DIAGNOSIS — I10 Essential (primary) hypertension: Secondary | ICD-10-CM | POA: Diagnosis not present

## 2017-09-19 DIAGNOSIS — Z7982 Long term (current) use of aspirin: Secondary | ICD-10-CM | POA: Diagnosis not present

## 2017-09-19 DIAGNOSIS — M8580 Other specified disorders of bone density and structure, unspecified site: Secondary | ICD-10-CM | POA: Diagnosis not present

## 2017-09-19 DIAGNOSIS — E669 Obesity, unspecified: Secondary | ICD-10-CM | POA: Diagnosis not present

## 2017-09-19 DIAGNOSIS — Z809 Family history of malignant neoplasm, unspecified: Secondary | ICD-10-CM | POA: Diagnosis not present

## 2017-09-19 DIAGNOSIS — E785 Hyperlipidemia, unspecified: Secondary | ICD-10-CM | POA: Diagnosis not present

## 2017-10-02 ENCOUNTER — Other Ambulatory Visit: Payer: Self-pay | Admitting: Family Medicine

## 2017-10-02 DIAGNOSIS — G4700 Insomnia, unspecified: Secondary | ICD-10-CM

## 2017-10-04 DIAGNOSIS — I10 Essential (primary) hypertension: Secondary | ICD-10-CM | POA: Diagnosis not present

## 2017-10-04 DIAGNOSIS — R3121 Asymptomatic microscopic hematuria: Secondary | ICD-10-CM | POA: Diagnosis not present

## 2017-10-04 DIAGNOSIS — R7989 Other specified abnormal findings of blood chemistry: Secondary | ICD-10-CM | POA: Diagnosis not present

## 2017-10-05 LAB — BASIC METABOLIC PANEL
BUN/Creatinine Ratio: 13 (ref 12–28)
BUN: 15 mg/dL (ref 8–27)
CO2: 21 mmol/L (ref 20–29)
CREATININE: 1.15 mg/dL — AB (ref 0.57–1.00)
Calcium: 9.6 mg/dL (ref 8.7–10.3)
Chloride: 105 mmol/L (ref 96–106)
GFR calc Af Amer: 57 mL/min/{1.73_m2} — ABNORMAL LOW (ref >59)
GFR, EST NON AFRICAN AMERICAN: 50 mL/min/{1.73_m2} — AB (ref >59)
Glucose: 90 mg/dL (ref 65–99)
Potassium: 4.8 mmol/L (ref 3.5–5.2)
SODIUM: 139 mmol/L (ref 134–144)

## 2017-10-05 LAB — URINALYSIS, ROUTINE W REFLEX MICROSCOPIC
BILIRUBIN UA: NEGATIVE
Glucose, UA: NEGATIVE
KETONES UA: NEGATIVE
LEUKOCYTES UA: NEGATIVE
NITRITE UA: NEGATIVE
Protein, UA: NEGATIVE
RBC UA: NEGATIVE
Specific Gravity, UA: 1.015 (ref 1.005–1.030)
Urobilinogen, Ur: 0.2 mg/dL (ref 0.2–1.0)
pH, UA: 6 (ref 5.0–7.5)

## 2018-01-10 ENCOUNTER — Other Ambulatory Visit: Payer: Self-pay | Admitting: Family Medicine

## 2018-01-10 DIAGNOSIS — G4700 Insomnia, unspecified: Secondary | ICD-10-CM

## 2018-01-20 ENCOUNTER — Encounter: Payer: Self-pay | Admitting: Family Medicine

## 2018-01-20 ENCOUNTER — Ambulatory Visit: Payer: Self-pay

## 2018-01-31 ENCOUNTER — Telehealth: Payer: Self-pay

## 2018-01-31 NOTE — Telephone Encounter (Signed)
LMTCB to schedule AWV prior to CPE on 03/14/18. -MM

## 2018-02-23 NOTE — Telephone Encounter (Signed)
Scheduled 01/05/19 @ 1:20 PM. -MM

## 2018-03-07 ENCOUNTER — Ambulatory Visit (INDEPENDENT_AMBULATORY_CARE_PROVIDER_SITE_OTHER): Payer: Medicare HMO

## 2018-03-07 VITALS — BP 136/82 | HR 72 | Temp 98.2°F | Ht 65.0 in | Wt 209.6 lb

## 2018-03-07 DIAGNOSIS — Z23 Encounter for immunization: Secondary | ICD-10-CM | POA: Diagnosis not present

## 2018-03-07 DIAGNOSIS — Z0000 Encounter for general adult medical examination without abnormal findings: Secondary | ICD-10-CM

## 2018-03-07 NOTE — Progress Notes (Addendum)
Subjective:   Whitney Stuart is a 68 y.o. female who presents for an Initial Medicare Annual Wellness Visit.  Review of Systems    N/A  Cardiac Risk Factors include: advanced age (>6355men, 56>65 women);dyslipidemia;hypertension;obesity (BMI >30kg/m2)     Objective:    Today's Vitals   03/07/18 1321  BP: 136/82  Pulse: 72  Temp: 98.2 F (36.8 C)  TempSrc: Oral  Weight: 209 lb 9.6 oz (95.1 kg)  Height: 5\' 5"  (1.651 m)  PainSc: 0-No pain   Body mass index is 34.88 kg/m.  Advanced Directives 03/07/2018 08/24/2017  Does Patient Have a Medical Advance Directive? No No  Would patient like information on creating a medical advance directive? No - Patient declined No - Patient declined    Current Medications (verified) Outpatient Encounter Medications as of 03/07/2018  Medication Sig  . amLODipine (NORVASC) 10 MG tablet Take 1 tablet (10 mg total) by mouth daily.  Marland Kitchen. aspirin 81 MG tablet Take 81 mg by mouth daily.  Marland Kitchen. atorvastatin (LIPITOR) 10 MG tablet Take 1 tablet (10 mg total) by mouth daily.  Marland Kitchen. lisinopril (PRINIVIL,ZESTRIL) 10 MG tablet Take 1 tablet (10 mg total) by mouth daily.  . traZODone (DESYREL) 100 MG tablet TAKE 1 TABLET BY MOUTH EVERY DAY AT BEDTIME AS NEEDED FOR SLEEP   No facility-administered encounter medications on file as of 03/07/2018.     Allergies (verified) Patient has no known allergies.   History: Past Medical History:  Diagnosis Date  . Arthritis   . Asthma   . Asthma   . GERD (gastroesophageal reflux disease)   . History of cervical cancer   . Hyperlipidemia   . Hypertension    Past Surgical History:  Procedure Laterality Date  . ABDOMINAL HYSTERECTOMY  1999   total  . HAND SURGERY Bilateral 2003   carpal tunnel  . LIPOMA EXCISION     Family History  Problem Relation Age of Onset  . Hypertension Father   . Kidney disease Father   . Heart disease Mother   . Stroke Mother   . Congestive Heart Failure Mother   . Hypertension Mother     . Heart murmur Sister   . Hypertension Sister   . Lung cancer Brother   . Hypertension Brother   . Hypertension Brother   . Sleep apnea Brother   . Mental illness Maternal Aunt   . Cancer Sister        ovarian  . Hypertension Sister   . Hypertension Sister   . Hypertension Brother   . Heart disease Brother   . Hypertension Brother   . Hypertension Brother   . Hypertension Brother   . Hypertension Brother   . Hypertension Brother   . Colon cancer Neg Hx   . Breast cancer Neg Hx    Social History   Socioeconomic History  . Marital status: Married    Spouse name: Delynn Flavinlvis Dean Lint  . Number of children: 7  . Years of education: Not on file  . Highest education level: GED or equivalent  Occupational History  . Occupation: Retired  Engineer, productionocial Needs  . Financial resource strain: Not hard at all  . Food insecurity:    Worry: Never true    Inability: Never true  . Transportation needs:    Medical: No    Non-medical: No  Tobacco Use  . Smoking status: Former Smoker    Packs/day: 0.50    Years: 6.00    Pack years: 3.00  Last attempt to quit: 01/24/1997    Years since quitting: 21.1  . Smokeless tobacco: Never Used  Substance and Sexual Activity  . Alcohol use: No  . Drug use: No  . Sexual activity: Yes    Birth control/protection: Surgical  Lifestyle  . Physical activity:    Days per week: 0 days    Minutes per session: 0 min  . Stress: Only a little  Relationships  . Social connections:    Talks on phone: Patient refused    Gets together: Patient refused    Attends religious service: Patient refused    Active member of club or organization: Patient refused    Attends meetings of clubs or organizations: Patient refused    Relationship status: Patient refused  Other Topics Concern  . Not on file  Social History Narrative  . Not on file    Tobacco Counseling Counseling given: Not Answered   Clinical Intake:  Pre-visit preparation completed: Yes  Pain  : No/denies pain Pain Score: 0-No pain     Nutritional Status: BMI > 30  Obese Nutritional Risks: None Diabetes: No  How often do you need to have someone help you when you read instructions, pamphlets, or other written materials from your doctor or pharmacy?: 1 - Never  Interpreter Needed?: No  Information entered by :: [facility], LPN   Activities of Daily Living In your present state of health, do you have any difficulty performing the following activities: 03/07/2018  Hearing? N  Vision? Y  Comment Wears readers daily. Pt needs a new prescription. Eye exam to be set up this year.   Difficulty concentrating or making decisions? N  Walking or climbing stairs? N  Dressing or bathing? N  Doing errands, shopping? N  Preparing Food and eating ? N  Using the Toilet? N  In the past six months, have you accidently leaked urine? N  Do you have problems with loss of bowel control? N  Managing your Medications? N  Managing your Finances? N  Housekeeping or managing your Housekeeping? N  Some recent data might be hidden     Immunizations and Health Maintenance Immunization History  Administered Date(s) Administered  . Influenza, High Dose Seasonal PF 01/12/2017, 03/07/2018  . Pneumococcal Conjugate-13 01/12/2017  . Pneumococcal Polysaccharide-23 03/07/2018  . Td 08/23/1995   Health Maintenance Due  Topic Date Due  . Janet Berlin  08/22/2005    Patient Care Team: Erasmo Downer, MD as PCP - General (Family Medicine)  Indicate any recent Medical Services you may have received from other than [facility] providers in the past year (date may be approximate).     Assessment:   This is a routine wellness examination for Whitney Stuart.  Hearing/Vision screen Vision Screening Comments: Pt does not have regular vision checks. Unsure when last eye exam was. Pt plans to set up apt this year.   Dietary issues and exercise activities discussed: Current Exercise Habits: The patient does  not participate in regular exercise at present, Exercise limited by: None identified  Goals    . Exercise 150 minutes per week (moderate activity)      Depression Screen PHQ 2/9 Scores 03/07/2018 03/07/2018 01/12/2017 11/21/2014  PHQ - 2 Score 0 0 0 3  PHQ- 9 Score 4 - - 12    Fall Risk Fall Risk  03/07/2018 01/12/2017 11/21/2014  Falls in the past year? 0 Yes No  Number falls in past yr: - 1 -  Injury with Fall? - No -  Follow up - Falls evaluation completed -    FALL RISK PREVENTION PERTAINING TO THE HOME:  Any stairs in or around the home WITH handrails? No  Home free of loose throw rugs in walkways, pet beds, electrical cords, etc? Yes  Adequate lighting in your home to reduce risk of falls? Yes   ASSISTIVE DEVICES UTILIZED TO PREVENT FALLS:  Life alert? No  Use of a cane, walker or w/c? No  Grab bars in the bathroom? No  Shower chair or bench in shower? No  Elevated toilet seat or a handicapped toilet? No    TIMED UP AND GO:  Was the test performed? No .    Cognitive Function:        Screening Tests Health Maintenance  Topic Date Due  . TETANUS/TDAP  08/22/2005  . MAMMOGRAM  02/23/2019  . Fecal DNA (Cologuard)  02/08/2020  . DEXA SCAN  02/22/2022  . INFLUENZA VACCINE  Completed  . Hepatitis C Screening  Completed  . PNA vac Low Risk Adult  Completed    Qualifies for Shingles Vaccine? Yes . Due for Shingrix. Education has been provided regarding the importance of this vaccine. Pt has been advised to call insurance company to determine out of pocket expense. Advised may also receive vaccine at local pharmacy or Health Dept. Verbalized acceptance and understanding.  Tdap: Although this vaccine is not a covered service during a Wellness Exam, does the patient still wish to receive this vaccine today?  No .  Education has been provided regarding the importance of this vaccine. Advised may receive this vaccine at local pharmacy or Health Dept. Aware to provide a  copy of the vaccination record if obtained from local pharmacy or Health Dept. Verbalized acceptance and understanding.  Flu Vaccine: Up to date  Pneumococcal Vaccine: Up to date   Cancer Screenings:  Colorectal Screening: Cologuard completed 02/07/17. Repeat every 3 years.  Mammogram: Completed 02/22/17.   Bone Density: Completed 02/22/17. Results reflect OSTEOPENIA. Repeat every 5 years.   Lung Cancer Screening: (Low Dose CT Chest recommended if Age 23-80 years, 30 pack-year currently smoking OR have quit w/in 15years.) does not qualify.    Additional Screening:  Hepatitis C Screening: Up to date  Vision Screening: Recommended annual ophthalmology exams for early detection of glaucoma and other disorders of the eye.  Dental Screening: Recommended annual dental exams for proper oral hygiene  Community Resource Referral:  CRR required this visit?  No       Plan:  I have personally reviewed and addressed the Medicare Annual Wellness questionnaire and have noted the following in the patient's chart:  A. Medical and social history B. Use of alcohol, tobacco or illicit drugs  C. Current medications and supplements D. Functional ability and status E.  Nutritional status F.  Physical activity G. Advance directives H. List of other physicians I.  Hospitalizations, surgeries, and ER visits in previous 12 months J.  Vitals K. Screenings such as hearing and vision if needed, cognitive and depression L. Referrals and appointments - none  In addition, I have reviewed and discussed with patient certain preventive protocols, quality metrics, and best practice recommendations. A written personalized care plan for preventive services as well as general preventive health recommendations were provided to patient.  See attached scanned questionnaire for additional information.   Signed,  Hyacinth MeekerMckenzie Connar Keating, LPN Nurse Health Advisor   Nurse Recommendations: Declines tetanus  vaccine today.

## 2018-03-07 NOTE — Patient Instructions (Signed)
Ms. Whitney Stuart , Thank you for taking time to come for your Medicare Wellness Visit. I appreciate your ongoing commitment to your health goals. Please review the following plan we discussed and let me know if I can assist you in the future.   Screening recommendations/referrals: Colonoscopy: Cologuard up to date, due 02/08/20 Mammogram: Up to date, due 01/2019 Bone Density: Up to date, due 01/2022 Recommended yearly ophthalmology/optometry visit for glaucoma screening and checkup Recommended yearly dental visit for hygiene and checkup  Vaccinations: Influenza vaccine: Administered today. Pneumococcal vaccine: Completed series today.  Tdap vaccine: Pt declines today.  Shingles vaccine: Pt declines today.     Advanced directives: Advance directive discussed with you today. Even though you declined this today please call our office should you change your mind and we can give you the proper paperwork for you to fill out.  Conditions/risks identified: Obesity- Continue trying to increase exercise to   Next appointment: 03/14/18 @ 8:00 AM with Dr Beryle Flock.   Preventive Care 67 Years and Older, Female Preventive care refers to lifestyle choices and visits with your health care provider that can promote health and wellness. What does preventive care include?  A yearly physical exam. This is also called an annual well check.  Dental exams once or twice a year.  Routine eye exams. Ask your health care provider how often you should have your eyes checked.  Personal lifestyle choices, including:  Daily care of your teeth and gums.  Regular physical activity.  Eating a healthy diet.  Avoiding tobacco and drug use.  Limiting alcohol use.  Practicing safe sex.  Taking low-dose aspirin every day.  Taking vitamin and mineral supplements as recommended by your health care provider. What happens during an annual well check? The services and screenings done by your health care provider  during your annual well check will depend on your age, overall health, lifestyle risk factors, and family history of disease. Counseling  Your health care provider may ask you questions about your:  Alcohol use.  Tobacco use.  Drug use.  Emotional well-being.  Home and relationship well-being.  Sexual activity.  Eating habits.  History of falls.  Memory and ability to understand (cognition).  Work and work Astronomer.  Reproductive health. Screening  You may have the following tests or measurements:  Height, weight, and BMI.  Blood pressure.  Lipid and cholesterol levels. These may be checked every 5 years, or more frequently if you are over 12 years old.  Skin check.  Lung cancer screening. You may have this screening every year starting at age 55 if you have a 30-pack-year history of smoking and currently smoke or have quit within the past 15 years.  Fecal occult blood test (FOBT) of the stool. You may have this test every year starting at age 23.  Flexible sigmoidoscopy or colonoscopy. You may have a sigmoidoscopy every 5 years or a colonoscopy every 10 years starting at age 7.  Hepatitis C blood test.  Hepatitis B blood test.  Sexually transmitted disease (STD) testing.  Diabetes screening. This is done by checking your blood sugar (glucose) after you have not eaten for a while (fasting). You may have this done every 1-3 years.  Bone density scan. This is done to screen for osteoporosis. You may have this done starting at age 32.  Mammogram. This may be done every 1-2 years. Talk to your health care provider about how often you should have regular mammograms. Talk with your health care provider  about your test results, treatment options, and if necessary, the need for more tests. Vaccines  Your health care provider may recommend certain vaccines, such as:  Influenza vaccine. This is recommended every year.  Tetanus, diphtheria, and acellular pertussis  (Tdap, Td) vaccine. You may need a Td booster every 10 years.  Zoster vaccine. You may need this after age 56.  Pneumococcal 13-valent conjugate (PCV13) vaccine. One dose is recommended after age 40.  Pneumococcal polysaccharide (PPSV23) vaccine. One dose is recommended after age 103. Talk to your health care provider about which screenings and vaccines you need and how often you need them. This information is not intended to replace advice given to you by your health care provider. Make sure you discuss any questions you have with your health care provider. Document Released: 02/07/2015 Document Revised: 10/01/2015 Document Reviewed: 11/12/2014 Elsevier Interactive Patient Education  2017 [place] Prevention in the Home Falls can cause injuries. They can happen to people of all ages. There are many things you can do to make your home safe and to help prevent falls. What can I do on the outside of my home?  Regularly fix the edges of walkways and driveways and fix any cracks.  Remove anything that might make you trip as you walk through a door, such as a raised step or threshold.  Trim any bushes or trees on the path to your home.  Use bright outdoor lighting.  Clear any walking paths of anything that might make someone trip, such as rocks or tools.  Regularly check to see if handrails are loose or broken. Make sure that both sides of any steps have handrails.  Any raised decks and porches should have guardrails on the edges.  Have any leaves, snow, or ice cleared regularly.  Use sand or salt on walking paths during winter.  Clean up any spills in your garage right away. This includes oil or grease spills. What can I do in the bathroom?  Use night lights.  Install grab bars by the toilet and in the tub and shower. Do not use towel bars as grab bars.  Use non-skid mats or decals in the tub or shower.  If you need to sit down in the shower, use a plastic, non-slip  stool.  Keep the floor dry. Clean up any water that spills on the floor as soon as it happens.  Remove soap buildup in the tub or shower regularly.  Attach bath mats securely with double-sided non-slip rug tape.  Do not have throw rugs and other things on the floor that can make you trip. What can I do in the bedroom?  Use night lights.  Make sure that you have a light by your bed that is easy to reach.  Do not use any sheets or blankets that are too big for your bed. They should not hang down onto the floor.  Have a firm chair that has side arms. You can use this for support while you get dressed.  Do not have throw rugs and other things on the floor that can make you trip. What can I do in the kitchen?  Clean up any spills right away.  Avoid walking on wet floors.  Keep items that you use a lot in easy-to-reach places.  If you need to reach something above you, use a strong step stool that has a grab bar.  Keep electrical cords out of the way.  Do not use floor polish or  wax that makes floors slippery. If you must use wax, use non-skid floor wax.  Do not have throw rugs and other things on the floor that can make you trip. What can I do with my stairs?  Do not leave any items on the stairs.  Make sure that there are handrails on both sides of the stairs and use them. Fix handrails that are broken or loose. Make sure that handrails are as long as the stairways.  Check any carpeting to make sure that it is firmly attached to the stairs. Fix any carpet that is loose or worn.  Avoid having throw rugs at the top or bottom of the stairs. If you do have throw rugs, attach them to the floor with carpet tape.  Make sure that you have a light switch at the top of the stairs and the bottom of the stairs. If you do not have them, ask someone to add them for you. What else can I do to help prevent falls?  Wear shoes that:  Do not have high heels.  Have rubber bottoms.  Are  comfortable and fit you well.  Are closed at the toe. Do not wear sandals.  If you use a stepladder:  Make sure that it is fully opened. Do not climb a closed stepladder.  Make sure that both sides of the stepladder are locked into place.  Ask someone to hold it for you, if possible.  Clearly mark and make sure that you can see:  Any grab bars or handrails.  First and last steps.  Where the edge of each step is.  Use tools that help you move around (mobility aids) if they are needed. These include:  Canes.  Walkers.  Scooters.  Crutches.  Turn on the lights when you go into a dark area. Replace any light bulbs as soon as they burn out.  Set up your furniture so you have a clear path. Avoid moving your furniture around.  If any of your floors are uneven, fix them.  If there are any pets around you, be aware of where they are.  Review your medicines with your doctor. Some medicines can make you feel dizzy. This can increase your chance of falling. Ask your doctor what other things that you can do to help prevent falls. This information is not intended to replace advice given to you by your health care provider. Make sure you discuss any questions you have with your health care provider. Document Released: 11/07/2008 Document Revised: 06/19/2015 Document Reviewed: 02/15/2014 Elsevier Interactive Patient Education  2017 [employer].

## 2018-03-14 ENCOUNTER — Encounter: Payer: Self-pay | Admitting: Family Medicine

## 2018-03-14 ENCOUNTER — Ambulatory Visit (INDEPENDENT_AMBULATORY_CARE_PROVIDER_SITE_OTHER): Payer: Medicare HMO | Admitting: Family Medicine

## 2018-03-14 VITALS — BP 143/84 | HR 87 | Temp 98.3°F | Wt 209.0 lb

## 2018-03-14 DIAGNOSIS — I10 Essential (primary) hypertension: Secondary | ICD-10-CM

## 2018-03-14 DIAGNOSIS — Z0000 Encounter for general adult medical examination without abnormal findings: Secondary | ICD-10-CM

## 2018-03-14 DIAGNOSIS — E7800 Pure hypercholesterolemia, unspecified: Secondary | ICD-10-CM | POA: Diagnosis not present

## 2018-03-14 DIAGNOSIS — H538 Other visual disturbances: Secondary | ICD-10-CM | POA: Diagnosis not present

## 2018-03-14 NOTE — Progress Notes (Signed)
Patient: Whitney Stuart, Female    DOB: 24-Feb-1950, 68 y.o.   MRN: [id number] Visit Date: 03/14/2018  Today's Provider: Shirlee LatchAngela , MD   Chief Complaint  Patient presents with  . Annual Exam   Subjective:  I, Whitney Stuart, CMA, am acting as a scribe for Shirlee LatchAngela , MD.   Patient had a AWE with McKenzie on 03/07/2018   Complete Physical Whitney Stuart is a 68 y.o. female. She feels well. She reports exercising . She reports she is sleeping fairly well with medication.  Did not take BP meds this AM.    Nees referral to Ophthalmology. -----------------------------------------------------------   Review of Systems  Constitutional: Negative.   HENT: Negative.   Eyes: Negative.   Respiratory: Negative.   Cardiovascular: Positive for leg swelling.  Gastrointestinal: Negative.   Endocrine: Negative.   Genitourinary: Negative.   Musculoskeletal: Negative.   Skin: Negative.   Allergic/Immunologic: Negative.   Neurological: Negative.   Hematological: Negative.   Psychiatric/Behavioral: Negative.     Social History   Socioeconomic History  . Marital status: Married    Spouse name: Delynn Flavinlvis Dean Ouellet  . Number of children: 7  . Years of education: Not on file  . Highest education level: GED or equivalent  Occupational History  . Occupation: Retired  Engineer, productionocial Needs  . Financial resource strain: Not hard at all  . Food insecurity:    Worry: Never true    Inability: Never true  . Transportation needs:    Medical: No    Non-medical: No  Tobacco Use  . Smoking status: Former Smoker    Packs/day: 0.50    Years: 6.00    Pack years: 3.00    Last attempt to quit: 01/24/1997    Years since quitting: 21.1  . Smokeless tobacco: Never Used  Substance and Sexual Activity  . Alcohol use: No  . Drug use: No  . Sexual activity: Yes    Birth control/protection: Surgical  Lifestyle  . Physical activity:    Days per week: 0 days    Minutes per session: 0 min   . Stress: Only a little  Relationships  . Social connections:    Talks on phone: Patient refused    Gets together: Patient refused    Attends religious service: Patient refused    Active member of club or organization: Patient refused    Attends meetings of clubs or organizations: Patient refused    Relationship status: Patient refused  . Intimate partner violence:    Fear of current or ex partner: Patient refused    Emotionally abused: Patient refused    Physically abused: Patient refused    Forced sexual activity: Patient refused  Other Topics Concern  . Not on file  Social History Narrative  . Not on file    Past Medical History:  Diagnosis Date  . Arthritis   . Asthma   . Asthma   . GERD (gastroesophageal reflux disease)   . History of cervical cancer   . Hyperlipidemia   . Hypertension      Patient Active Problem List   Diagnosis Date Noted  . Asymptomatic microscopic hematuria 09/06/2017  . Muscle spasms of neck 03/09/2017  . Obesity 02/09/2017  . Calculus of gallbladder 11/21/2014  . Arthralgia of hip 11/21/2014  . Insomnia 11/21/2014  . Pure hypercholesterolemia 04/11/2008  . Asthma, exogenous 03/28/2008  . Essential (primary) hypertension 03/28/2008  . Personal history of malignant neoplasm of cervix uteri 03/28/2008  Past Surgical History:  Procedure Laterality Date  . ABDOMINAL HYSTERECTOMY  1999   total  . HAND SURGERY Bilateral 2003   carpal tunnel  . LIPOMA EXCISION      Her family history includes Cancer in her sister; Congestive Heart Failure in her mother; Heart disease in her brother and mother; Heart murmur in her sister; Hypertension in her brother, brother, brother, brother, brother, brother, brother, brother, father, mother, sister, sister, and sister; Kidney disease in her father; Lung cancer in her brother; Mental illness in her maternal aunt; Sleep apnea in her brother; Stroke in her mother. There is no history of Colon cancer or  Breast cancer.   Current Outpatient Medications:  .  amLODipine (NORVASC) 10 MG tablet, Take 1 tablet (10 mg total) by mouth daily., Disp: 90 tablet, Rfl: 3 .  aspirin 81 MG tablet, Take 81 mg by mouth daily., Disp: , Rfl:  .  atorvastatin (LIPITOR) 10 MG tablet, Take 1 tablet (10 mg total) by mouth daily., Disp: 90 tablet, Rfl: 3 .  lisinopril (PRINIVIL,ZESTRIL) 10 MG tablet, Take 1 tablet (10 mg total) by mouth daily., Disp: 90 tablet, Rfl: 3 .  traZODone (DESYREL) 100 MG tablet, TAKE 1 TABLET BY MOUTH EVERY DAY AT BEDTIME AS NEEDED FOR SLEEP, Disp: 90 tablet, Rfl: 2  Patient Care Team: Erasmo Downer, MD as PCP - General (Family Medicine)     Objective:    Vitals: BP (!) 143/84 (BP Location: Left Arm, Patient Position: Sitting, Cuff Size: Large)   Pulse 87   Temp 98.3 F (36.8 C) (Oral)   Wt 209 lb (94.8 kg)   SpO2 97%   BMI 34.78 kg/m   Physical Exam Vitals signs reviewed.  Constitutional:      General: She is not in acute distress.    Appearance: Normal appearance. She is well-developed. She is not diaphoretic.  HENT:     Head: Normocephalic and atraumatic.     Right Ear: Tympanic membrane, ear canal and external ear normal.     Left Ear: Tympanic membrane, ear canal and external ear normal.     Nose: Nose normal.     Mouth/Throat:     Mouth: Mucous membranes are moist.     Pharynx: Oropharynx is clear. No oropharyngeal exudate.  Eyes:     General: No scleral icterus.    Conjunctiva/sclera: Conjunctivae normal.     Pupils: Pupils are equal, round, and reactive to light.  Neck:     Musculoskeletal: Neck supple.     Thyroid: No thyromegaly.  Cardiovascular:     Rate and Rhythm: Normal rate and regular rhythm.     Pulses: Normal pulses.     Heart sounds: Normal heart sounds. No murmur.  Pulmonary:     Effort: Pulmonary effort is normal. No respiratory distress.     Breath sounds: Normal breath sounds. No wheezing or rales.  Abdominal:     General: Bowel  sounds are normal. There is no distension.     Palpations: Abdomen is soft.     Tenderness: There is no abdominal tenderness. There is no guarding or rebound.  Musculoskeletal:        General: No deformity.     Right lower leg: No edema.     Left lower leg: No edema.  Lymphadenopathy:     Cervical: No cervical adenopathy.  Skin:    General: Skin is warm and dry.     Capillary Refill: Capillary refill takes less than 2 seconds.  Findings: No rash.  Neurological:     Mental Status: She is alert and oriented to person, place, and time. Mental status is at baseline.  Psychiatric:        Mood and Affect: Mood normal.        Behavior: Behavior normal.        Thought Content: Thought content normal.     Activities of Daily Living In your present state of health, do you have any difficulty performing the following activities: 03/07/2018  Hearing? N  Vision? Y  Comment Wears readers daily. Pt needs a new prescription. Eye exam to be set up this year.   Difficulty concentrating or making decisions? N  Walking or climbing stairs? N  Dressing or bathing? N  Doing errands, shopping? N  Preparing Food and eating ? N  Using the Toilet? N  In the past six months, have you accidently leaked urine? N  Do you have problems with loss of bowel control? N  Managing your Medications? N  Managing your Finances? N  Housekeeping or managing your Housekeeping? N  Some recent data might be hidden    Fall Risk Assessment Fall Risk  03/07/2018 01/12/2017 11/21/2014  Falls in the past year? 0 Yes No  Number falls in past yr: - 1 -  Injury with Fall? - No -  Follow up - Falls evaluation completed -     Depression Screen PHQ 2/9 Scores 03/07/2018 03/07/2018 01/12/2017 11/21/2014  PHQ - 2 Score 0 0 0 3  PHQ- 9 Score 4 - - 12    No flowsheet data found.     Assessment & Plan:    Annual Physical Reviewed patient's Family Medical History Reviewed and updated list of patient's medical  providers Assessment of cognitive impairment was done Assessed patient's functional ability Established a written schedule for health screening services Health Risk Assessent Completed and Reviewed  Exercise Activities and Dietary recommendations Goals    . Exercise 150 minutes per week (moderate activity)       Immunization History  Administered Date(s) Administered  . Influenza, High Dose Seasonal PF 01/12/2017, 03/07/2018  . Pneumococcal Conjugate-13 01/12/2017  . Pneumococcal Polysaccharide-23 03/07/2018  . Td 08/23/1995    Health Maintenance  Topic Date Due  . TETANUS/TDAP  03/14/2022 (Originally 08/22/2005)  . MAMMOGRAM  02/23/2019  . Fecal DNA (Cologuard)  02/08/2020  . DEXA SCAN  02/22/2022  . INFLUENZA VACCINE  Completed  . Hepatitis C Screening  Completed  . PNA vac Low Risk Adult  Completed     Discussed health benefits of physical activity, and encouraged her to engage in regular exercise appropriate for her age and condition.    ------------------------------------------------------------------------------------------------------------  Problem List Items Addressed This Visit      Cardiovascular and Mediastinum   Essential (primary) hypertension   Relevant Orders   Comprehensive metabolic panel     Other   Pure hypercholesterolemia   Relevant Orders   Lipid panel   Comprehensive metabolic panel    Other Visit Diagnoses    Encounter for annual physical exam    -  Primary   Blurry vision       Relevant Orders   Ambulatory referral to Ophthalmology       Return in about 6 months (around 09/12/2018) for chronic disease f/u.   The entirety of the information documented in the History of Present Illness, Review of Systems and Physical Exam were personally obtained by me. Portions of this information were initially documented  by Whitney Raddle, CMA and reviewed by me for thoroughness and accuracy.    Erasmo Downer, MD, MPH [facility] 03/14/2018 9:45 AM

## 2018-03-14 NOTE — Patient Instructions (Addendum)
The CDC recommends two doses of Shingrix (the shingles vaccine) separated by 2 to 6 months for adults age 67 years and older. I recommend checking with your insurance plan regarding coverage for this vaccine.      Preventive Care 15 Years and Older, Female Preventive care refers to lifestyle choices and visits with your health care provider that can promote health and wellness. What does preventive care include?  A yearly physical exam. This is also called an annual well check.  Dental exams once or twice a year.  Routine eye exams. Ask your health care provider how often you should have your eyes checked.  Personal lifestyle choices, including: ? Daily care of your teeth and gums. ? Regular physical activity. ? Eating a healthy diet. ? Avoiding tobacco and drug use. ? Limiting alcohol use. ? Practicing safe sex. ? Taking low-dose aspirin every day. ? Taking vitamin and mineral supplements as recommended by your health care provider. What happens during an annual well check? The services and screenings done by your health care provider during your annual well check will depend on your age, overall health, lifestyle risk factors, and family history of disease. Counseling Your health care provider may ask you questions about your:  Alcohol use.  Tobacco use.  Drug use.  Emotional well-being.  Home and relationship well-being.  Sexual activity.  Eating habits.  History of falls.  Memory and ability to understand (cognition).  Work and work Statistician.  Reproductive health.  Screening You may have the following tests or measurements:  Height, weight, and BMI.  Blood pressure.  Lipid and cholesterol levels. These may be checked every 5 years, or more frequently if you are over 69 years old.  Skin check.  Lung cancer screening. You may have this screening every year starting at age 66 if you have a 30-pack-year history of smoking and currently smoke or have  quit within the past 15 years.  Colorectal cancer screening. All adults should have this screening starting at age 61 and continuing until age 45. You will have tests every 1-10 years, depending on your results and the type of screening test. People at increased risk should start screening at an earlier age. Screening tests may include: ? Guaiac-based fecal occult blood testing. ? Fecal immunochemical test (FIT). ? Stool DNA test. ? Virtual colonoscopy. ? Sigmoidoscopy. During this test, a flexible tube with a tiny camera (sigmoidoscope) is used to examine your rectum and lower colon. The sigmoidoscope is inserted through your anus into your rectum and lower colon. ? Colonoscopy. During this test, a long, thin, flexible tube with a tiny camera (colonoscope) is used to examine your entire colon and rectum.  Hepatitis C blood test.  Hepatitis B blood test.  Sexually transmitted disease (STD) testing.  Diabetes screening. This is done by checking your blood sugar (glucose) after you have not eaten for a while (fasting). You may have this done every 1-3 years.  Bone density scan. This is done to screen for osteoporosis. You may have this done starting at age 22.  Mammogram. This may be done every 1-2 years. Talk to your health care provider about how often you should have regular mammograms. Talk with your health care provider about your test results, treatment options, and if necessary, the need for more tests. Vaccines Your health care provider may recommend certain vaccines, such as:  Influenza vaccine. This is recommended every year.  Tetanus, diphtheria, and acellular pertussis (Tdap, Td) vaccine. You may need  a Td booster every 10 years.  Varicella vaccine. You may need this if you have not been vaccinated.  Zoster vaccine. You may need this after age 28.  Measles, mumps, and rubella (MMR) vaccine. You may need at least one dose of MMR if you were born in 1957 or later. You may  also need a second dose.  Pneumococcal 13-valent conjugate (PCV13) vaccine. One dose is recommended after age 44.  Pneumococcal polysaccharide (PPSV23) vaccine. One dose is recommended after age 63.  Meningococcal vaccine. You may need this if you have certain conditions.  Hepatitis A vaccine. You may need this if you have certain conditions or if you travel or work in places where you may be exposed to hepatitis A.  Hepatitis B vaccine. You may need this if you have certain conditions or if you travel or work in places where you may be exposed to hepatitis B.  Haemophilus influenzae type b (Hib) vaccine. You may need this if you have certain conditions. Talk to your health care provider about which screenings and vaccines you need and how often you need them. This information is not intended to replace advice given to you by your health care provider. Make sure you discuss any questions you have with your health care provider. Document Released: 02/07/2015 Document Revised: 03/03/2017 Document Reviewed: 11/12/2014 Elsevier Interactive Patient Education  2019 [employer].

## 2018-03-15 ENCOUNTER — Telehealth: Payer: Self-pay

## 2018-03-15 LAB — LIPID PANEL
CHOL/HDL RATIO: 2.9 ratio (ref 0.0–4.4)
Cholesterol, Total: 171 mg/dL (ref 100–199)
HDL: 59 mg/dL (ref >39)
LDL Calculated: 94 mg/dL (ref 0–99)
Triglycerides: 90 mg/dL (ref 0–149)
VLDL Cholesterol Cal: 18 mg/dL (ref 5–40)

## 2018-03-15 LAB — COMPREHENSIVE METABOLIC PANEL
ALT: 19 IU/L (ref 0–32)
AST: 11 IU/L (ref 0–40)
Albumin/Globulin Ratio: 1.4 (ref 1.2–2.2)
Albumin: 4.4 g/dL (ref 3.8–4.8)
Alkaline Phosphatase: 77 IU/L (ref 39–117)
BILIRUBIN TOTAL: 0.3 mg/dL (ref 0.0–1.2)
BUN/Creatinine Ratio: 11 — ABNORMAL LOW (ref 12–28)
BUN: 12 mg/dL (ref 8–27)
CALCIUM: 9.4 mg/dL (ref 8.7–10.3)
CHLORIDE: 103 mmol/L (ref 96–106)
CO2: 23 mmol/L (ref 20–29)
Creatinine, Ser: 1.05 mg/dL — ABNORMAL HIGH (ref 0.57–1.00)
GFR calc non Af Amer: 55 mL/min/{1.73_m2} — ABNORMAL LOW (ref >59)
GFR, EST AFRICAN AMERICAN: 64 mL/min/{1.73_m2} (ref >59)
GLUCOSE: 89 mg/dL (ref 65–99)
Globulin, Total: 3.2 g/dL (ref 1.5–4.5)
Potassium: 4.5 mmol/L (ref 3.5–5.2)
Sodium: 140 mmol/L (ref 134–144)
TOTAL PROTEIN: 7.6 g/dL (ref 6.0–8.5)

## 2018-03-15 NOTE — Telephone Encounter (Signed)
Patient was advised.  

## 2018-03-15 NOTE — Telephone Encounter (Signed)
-----   Message from Erasmo Downer, MD sent at 03/15/2018  9:00 AM EST ----- Stable labs.  No changes to medications

## 2018-04-26 ENCOUNTER — Other Ambulatory Visit: Payer: Self-pay | Admitting: Family Medicine

## 2018-04-26 DIAGNOSIS — I10 Essential (primary) hypertension: Secondary | ICD-10-CM

## 2018-04-26 DIAGNOSIS — E7800 Pure hypercholesterolemia, unspecified: Secondary | ICD-10-CM

## 2018-05-19 ENCOUNTER — Other Ambulatory Visit: Payer: Self-pay | Admitting: Family Medicine

## 2018-05-19 DIAGNOSIS — I10 Essential (primary) hypertension: Secondary | ICD-10-CM

## 2018-09-12 ENCOUNTER — Ambulatory Visit (INDEPENDENT_AMBULATORY_CARE_PROVIDER_SITE_OTHER): Payer: Medicare HMO | Admitting: Family Medicine

## 2018-09-12 ENCOUNTER — Encounter: Payer: Self-pay | Admitting: Family Medicine

## 2018-09-12 ENCOUNTER — Other Ambulatory Visit: Payer: Self-pay

## 2018-09-12 VITALS — BP 135/78 | HR 86 | Temp 98.6°F | Wt 206.4 lb

## 2018-09-12 DIAGNOSIS — G4700 Insomnia, unspecified: Secondary | ICD-10-CM

## 2018-09-12 DIAGNOSIS — I10 Essential (primary) hypertension: Secondary | ICD-10-CM

## 2018-09-12 DIAGNOSIS — E7800 Pure hypercholesterolemia, unspecified: Secondary | ICD-10-CM

## 2018-09-12 DIAGNOSIS — J4520 Mild intermittent asthma, uncomplicated: Secondary | ICD-10-CM | POA: Diagnosis not present

## 2018-09-12 DIAGNOSIS — H2513 Age-related nuclear cataract, bilateral: Secondary | ICD-10-CM | POA: Diagnosis not present

## 2018-09-12 DIAGNOSIS — E669 Obesity, unspecified: Secondary | ICD-10-CM

## 2018-09-12 DIAGNOSIS — Z6834 Body mass index (BMI) 34.0-34.9, adult: Secondary | ICD-10-CM | POA: Diagnosis not present

## 2018-09-12 MED ORDER — ALBUTEROL SULFATE HFA 108 (90 BASE) MCG/ACT IN AERS: 2.0000 | INHALATION_SPRAY | Freq: Four times a day (QID) | RESPIRATORY_TRACT | 1 refills | Status: DC | PRN

## 2018-09-12 NOTE — Assessment & Plan Note (Signed)
Well controlled on last lipid panel Continue Lipitor

## 2018-09-12 NOTE — Assessment & Plan Note (Signed)
Well controlled Continue trazodone prn

## 2018-09-12 NOTE — Assessment & Plan Note (Signed)
Well controlled Continue current medications Recheck metabolic panel F/u in 6 months  

## 2018-09-12 NOTE — Assessment & Plan Note (Signed)
Likely 2/2 allergic rhinitis vs smoke exposure from husband Given duration of symptoms, very low suspicion for COVID 19 infection Lungs clear Can use albuterol prn

## 2018-09-12 NOTE — Assessment & Plan Note (Signed)
Discussed importance of healthy weight management Discussed diet and exercise  

## 2018-09-12 NOTE — Progress Notes (Signed)
Patient: Whitney Stuart Female    DOB: 04/27/1950   68 y.o.   MRN: [id number] Visit Date: 09/12/2018  Today's Provider: Shirlee LatchAngela , MD   No chief complaint on file.  Subjective:    HPI  Hypertension, follow-up:  BP Readings from Last 3 Encounters:  09/12/18 (!) 148/90  03/14/18 (!) 143/84  03/07/18 136/82    She was last seen for hypertension 6 months ago.  BP at that visit was 143/84. Management changes since that visit include none. She reports good compliance with treatment. She is not having side effects.  She is exercising. She is not adherent to low salt diet.   Outside blood pressures are not checked. She is experiencing none.  Patient denies chest pain, chest pressure/discomfort, exertional chest pressure/discomfort, fatigue and irregular heart beat.   Cardiovascular risk factors include advanced age (older than 5155 for men, 5065 for women) and hypertension.  Use of agents associated with hypertension: none.     Weight trend: stable Wt Readings from Last 3 Encounters:  09/12/18 206 lb 6.4 oz (93.6 kg)  03/14/18 209 lb (94.8 kg)  03/07/18 209 lb 9.6 oz (95.1 kg)    Current diet: well balanced  ------------------------------------------------------------------------  Lipid/Cholesterol, Follow-up:   Last seen for this6 months ago.  Management changes since that visit include none. . Last Lipid Panel:    Component Value Date/Time   CHOL 171 03/14/2018 0938   TRIG 90 03/14/2018 0938   HDL 59 03/14/2018 0938   CHOLHDL 2.9 03/14/2018 0938   LDLCALC 94 03/14/2018 0938    Risk factors for vascular disease include hypercholesterolemia and hypertension  She reports good compliance with treatment. She is not having side effects.  Current symptoms include none and have been stable. Weight trend: stable Prior visit with dietician: no Current diet: well balanced Current exercise: gardening and yard work.  Wt Readings from Last 3 Encounters:   09/12/18 206 lb 6.4 oz (93.6 kg)  03/14/18 209 lb (94.8 kg)  03/07/18 209 lb 9.6 oz (95.1 kg)    -------------------------------------------------------------------  Feeling more wheezing Does not have inhaler Made husband smoke outside Coughing more in the mornings and evenings for last 6 months Using menthol cough drop   No Known Allergies   Current Outpatient Medications:  .  amLODipine (NORVASC) 10 MG tablet, TAKE 1 TABLET BY MOUTH EVERY DAY, Disp: 90 tablet, Rfl: 3 .  aspirin 81 MG tablet, Take 81 mg by mouth daily., Disp: , Rfl:  .  atorvastatin (LIPITOR) 10 MG tablet, TAKE 1 TABLET BY MOUTH EVERY DAY, Disp: 90 tablet, Rfl: 3 .  Calcium Carb-Cholecalciferol (CALCIUM 600+D3 PO), Take by mouth., Disp: , Rfl:  .  lisinopril (PRINIVIL,ZESTRIL) 10 MG tablet, TAKE 1 TABLET BY MOUTH EVERY DAY, Disp: 90 tablet, Rfl: 3 .  traZODone (DESYREL) 100 MG tablet, TAKE 1 TABLET BY MOUTH EVERY DAY AT BEDTIME AS NEEDED FOR SLEEP, Disp: 90 tablet, Rfl: 2  Review of Systems  Constitutional: Negative.   Respiratory: Negative.   Genitourinary: Negative.   Neurological: Negative.     Social History   Tobacco Use  . Smoking status: Former Smoker    Packs/day: 0.50    Years: 6.00    Pack years: 3.00    Quit date: 01/24/1997    Years since quitting: 21.6  . Smokeless tobacco: Never Used  Substance Use Topics  . Alcohol use: No      Objective:   BP (!) 148/90 (BP Location:  Right Arm, Patient Position: Sitting, Cuff Size: Normal)   Pulse 86   Temp 98.6 F (37 C) (Oral)   Wt 206 lb 6.4 oz (93.6 kg)   SpO2 99%   BMI 34.35 kg/m  Vitals:   09/12/18 0945  BP: (!) 148/90  Pulse: 86  Temp: 98.6 F (37 C)  TempSrc: Oral  SpO2: 99%  Weight: 206 lb 6.4 oz (93.6 kg)     Physical Exam Vitals signs reviewed.  Constitutional:      General: She is not in acute distress.    Appearance: Normal appearance. She is well-developed. She is not diaphoretic.  HENT:     Head: Normocephalic  and atraumatic.  Eyes:     General: No scleral icterus.    Conjunctiva/sclera: Conjunctivae normal.  Neck:     Musculoskeletal: Neck supple.     Thyroid: No thyromegaly.  Cardiovascular:     Rate and Rhythm: Normal rate and regular rhythm.     Pulses: Normal pulses.     Heart sounds: Normal heart sounds. No murmur.  Pulmonary:     Effort: Pulmonary effort is normal. No respiratory distress.     Breath sounds: Normal breath sounds. No wheezing, rhonchi or rales.  Musculoskeletal:     Right lower leg: No edema.     Left lower leg: No edema.  Lymphadenopathy:     Cervical: No cervical adenopathy.  Skin:    General: Skin is warm and dry.     Capillary Refill: Capillary refill takes less than 2 seconds.     Findings: No rash.  Neurological:     Mental Status: She is alert and oriented to person, place, and time. Mental status is at baseline.  Psychiatric:        Mood and Affect: Mood normal.        Behavior: Behavior normal.      No results found for any visits on 09/12/18.     Assessment & Plan    Problem List Items Addressed This Visit      Cardiovascular and Mediastinum   Essential (primary) hypertension - Primary    Well controlled Continue current medications Recheck metabolic panel F/u in 6 months       Relevant Orders   Basic Metabolic Panel (BMET)     Respiratory   Asthma, exogenous    Likely 2/2 allergic rhinitis vs smoke exposure from husband Given duration of symptoms, very low suspicion for COVID 19 infection Lungs clear Can use albuterol prn      Relevant Medications   albuterol (VENTOLIN HFA) 108 (90 Base) MCG/ACT inhaler     Other   Pure hypercholesterolemia    Well controlled on last lipid panel Continue Lipitor      Insomnia    Well controlled Continue trazodone prn      Obesity    Discussed importance of healthy weight management Discussed diet and exercise           Return in about 6 months (around 03/15/2019) for CPE/AWV.    The entirety of the information documented in the History of Present Illness, Review of Systems and Physical Exam were personally obtained by me. Portions of this information were initially documented by [facility], CMA and reviewed by me for thoroughness and accuracy.    , Dionne Bucy, MD MPH [facility]

## 2018-09-13 ENCOUNTER — Telehealth: Payer: Self-pay

## 2018-09-13 LAB — BASIC METABOLIC PANEL
BUN/Creatinine Ratio: 15 (ref 12–28)
BUN: 19 mg/dL (ref 8–27)
CO2: 22 mmol/L (ref 20–29)
Calcium: 10 mg/dL (ref 8.7–10.3)
Chloride: 103 mmol/L (ref 96–106)
Creatinine, Ser: 1.26 mg/dL — ABNORMAL HIGH (ref 0.57–1.00)
GFR calc Af Amer: 51 mL/min/{1.73_m2} — ABNORMAL LOW (ref >59)
GFR calc non Af Amer: 44 mL/min/{1.73_m2} — ABNORMAL LOW (ref >59)
Glucose: 96 mg/dL (ref 65–99)
Potassium: 4.6 mmol/L (ref 3.5–5.2)
Sodium: 140 mmol/L (ref 134–144)

## 2018-09-13 NOTE — Telephone Encounter (Signed)
-----   Message from Virginia Crews, MD sent at 09/13/2018  9:41 AM EDT ----- Kidney function stable around baseline.  Normal electrolytes

## 2018-09-13 NOTE — Telephone Encounter (Signed)
Patient advised as below.  

## 2018-09-13 NOTE — Telephone Encounter (Signed)
Pt returned missed call.  Please call pt back. ° °Thanks, °[facility] °

## 2018-09-13 NOTE — Telephone Encounter (Signed)
LVMTRC 

## 2018-10-24 ENCOUNTER — Other Ambulatory Visit: Payer: Self-pay | Admitting: Family Medicine

## 2019-03-09 ENCOUNTER — Ambulatory Visit: Payer: Medicare HMO

## 2019-03-13 NOTE — Progress Notes (Signed)
Subjective:   Whitney Stuart is a 69 y.o. female who presents for Medicare Annual (Subsequent) preventive examination.    This visit is being conducted through telemedicine due to the COVID-19 pandemic. This patient has given me verbal consent via doximity to conduct this visit, patient states they are participating from their home address. Some vital signs may be absent or patient reported.    Patient identification: identified by name, DOB, and current address  Review of Systems:  N/A  Cardiac Risk Factors include: advanced age (>72men, >70 women);dyslipidemia;hypertension     Objective:     Vitals: There were no vitals taken for this visit.  There is no height or weight on file to calculate BMI. Unable to obtain vitals due to visit being conducted via telephonically.   Advanced Directives 03/14/2019 03/07/2018 08/24/2017  Does Patient Have a Medical Advance Directive? No No No  Would patient like information on creating a medical advance directive? No - Patient declined No - Patient declined No - Patient declined    Tobacco Social History   Tobacco Use  Smoking Status Former Smoker  . Packs/day: 0.50  . Years: 6.00  . Pack years: 3.00  . Quit date: 01/24/1997  . Years since quitting: 22.1  Smokeless Tobacco Never Used     Counseling given: Not Answered   Clinical Intake:  Pre-visit preparation completed: Yes  Pain : No/denies pain Pain Score: 0-No pain     Nutritional Risks: None Diabetes: No  How often do you need to have someone help you when you read instructions, pamphlets, or other written materials from your doctor or pharmacy?: 1 - Never  Interpreter Needed?: No  Information entered by :: [facility], LPN  Past Medical History:  Diagnosis Date  . Arthritis   . Asthma   . Asthma   . GERD (gastroesophageal reflux disease)   . History of cervical cancer   . Hyperlipidemia   . Hypertension    Past Surgical History:  Procedure Laterality Date    . ABDOMINAL HYSTERECTOMY  1999   total  . HAND SURGERY Bilateral 2003   carpal tunnel  . LIPOMA EXCISION     Family History  Problem Relation Age of Onset  . Hypertension Father   . Kidney disease Father   . Heart disease Mother   . Stroke Mother   . Congestive Heart Failure Mother   . Hypertension Mother   . Heart murmur Sister   . Hypertension Sister   . Lung cancer Brother   . Hypertension Brother   . Hypertension Brother   . Sleep apnea Brother   . Mental illness Maternal Aunt   . Cancer Sister        ovarian  . Hypertension Sister   . Hypertension Sister   . Hypertension Brother   . Heart disease Brother   . Hypertension Brother   . Hypertension Brother   . Hypertension Brother   . Hypertension Brother   . Hypertension Brother   . Colon cancer Neg Hx   . Breast cancer Neg Hx    Social History   Socioeconomic History  . Marital status: Married    Spouse name: Whitney Stuart  . Number of children: 7  . Years of education: Not on file  . Highest education level: GED or equivalent  Occupational History  . Occupation: Retired  Tobacco Use  . Smoking status: Former Smoker    Packs/day: 0.50    Years: 6.00    Pack  years: 3.00    Quit date: 01/24/1997    Years since quitting: 22.1  . Smokeless tobacco: Never Used  Substance and Sexual Activity  . Alcohol use: No  . Drug use: No  . Sexual activity: Yes    Birth control/protection: Surgical  Other Topics Concern  . Not on file  Social History Narrative  . Not on file   Social Determinants of Health   Financial Resource Strain: Low Risk   . Difficulty of Paying Living Expenses: Not hard at all  Food Insecurity: No Food Insecurity  . Worried About Programme researcher, broadcasting/film/video in the Last Year: Never true  . Ran Out of Food in the Last Year: Never true  Transportation Needs: No Transportation Needs  . Lack of Transportation (Medical): No  . Lack of Transportation (Non-Medical): No  Physical Activity:  Inactive  . Days of Exercise per Week: 0 days  . Minutes of Exercise per Session: 0 min  Stress: No Stress Concern Present  . Feeling of Stress : Not at all  Social Connections: Slightly Isolated  . Frequency of Communication with Friends and Family: More than three times a week  . Frequency of Social Gatherings with Friends and Family: More than three times a week  . Attends Religious Services: More than 4 times per year  . Active Member of Clubs or Organizations: No  . Attends Banker Meetings: Never  . Marital Status: Married    Outpatient Encounter Medications as of 03/14/2019  Medication Sig  . albuterol (VENTOLIN HFA) 108 (90 Base) MCG/ACT inhaler TAKE 2 PUFFS BY MOUTH EVERY 6 HOURS AS NEEDED FOR WHEEZE OR SHORTNESS OF BREATH  . amLODipine (NORVASC) 10 MG tablet TAKE 1 TABLET BY MOUTH EVERY DAY  . aspirin 81 MG tablet Take 81 mg by mouth daily.  Marland Kitchen atorvastatin (LIPITOR) 10 MG tablet TAKE 1 TABLET BY MOUTH EVERY DAY  . Calcium Carb-Cholecalciferol (CALCIUM 600+D3 PO) Take by mouth.  Marland Kitchen lisinopril (PRINIVIL,ZESTRIL) 10 MG tablet TAKE 1 TABLET BY MOUTH EVERY DAY  . traZODone (DESYREL) 100 MG tablet TAKE 1 TABLET BY MOUTH EVERY DAY AT BEDTIME AS NEEDED FOR SLEEP   No facility-administered encounter medications on file as of 03/14/2019.    Activities of Daily Living In your present state of health, do you have any difficulty performing the following activities: 03/14/2019  Hearing? N  Vision? N  Difficulty concentrating or making decisions? N  Walking or climbing stairs? N  Dressing or bathing? N  Doing errands, shopping? N  Preparing Food and eating ? N  Using the Toilet? N  In the past six months, have you accidently leaked urine? N  Do you have problems with loss of bowel control? N  Managing your Medications? N  Managing your Finances? N  Housekeeping or managing your Housekeeping? N  Some recent data might be hidden    Patient Care Team: Erasmo Downer, MD as PCP - General (Family Medicine) Elliot Cousin, MD as Consulting Physician (Ophthalmology)    Assessment:   This is a routine wellness examination for Whitney Stuart.  Exercise Activities and Dietary recommendations Current Exercise Habits: The patient does not participate in regular exercise at present, Exercise limited by: None identified  Goals    . Exercise 150 minutes per week (moderate activity)       Fall Risk: Fall Risk  03/14/2019 03/07/2018 01/12/2017 11/21/2014  Falls in the past year? 0 0 Yes No  Number falls in past yr: 0 -  1 -  Injury with Fall? 0 - No -  Follow up - - Falls evaluation completed -    FALL RISK PREVENTION PERTAINING TO THE HOME:  Any stairs in or around the home? Yes  If so, are there any without handrails? No   Home free of loose throw rugs in walkways, pet beds, electrical cords, etc? Yes  Adequate lighting in your home to reduce risk of falls? Yes   ASSISTIVE DEVICES UTILIZED TO PREVENT FALLS:  Life alert? No  Use of a cane, walker or w/c? No  Grab bars in the bathroom? No  Shower chair or bench in shower? No  Elevated toilet seat or a handicapped toilet? No    TIMED UP AND GO:  Was the test performed? No .    Depression Screen PHQ 2/9 Scores 03/14/2019 03/07/2018 03/07/2018 01/12/2017  PHQ - 2 Score 0 0 0 0  PHQ- 9 Score - 4 - -     Cognitive Function: Declined today.         Immunization History  Administered Date(s) Administered  . Influenza, High Dose Seasonal PF 01/12/2017, 03/07/2018  . Pneumococcal Conjugate-13 01/12/2017  . Pneumococcal Polysaccharide-23 03/07/2018  . Td 08/23/1995    Qualifies for Shingles Vaccine? Yes . Due for Shingrix. Pt has been advised to call insurance company to determine out of pocket expense. Advised may also receive vaccine at local pharmacy or Health Dept. Verbalized acceptance and understanding.  Tdap: Although this vaccine is not a covered service during a Wellness Exam, does the  patient still wish to receive this vaccine today?  No . Advised may receive this vaccine at local pharmacy or Health Dept. Aware to provide a copy of the vaccination record if obtained from local pharmacy or Health Dept. Verbalized acceptance and understanding.  Flu Vaccine: Due for Flu vaccine. Does the patient want to receive this vaccine today?  No . Advised may receive this vaccine at local pharmacy or Health Dept. Aware to provide a copy of the vaccination record if obtained from local pharmacy or Health Dept. Verbalized acceptance and understanding.  Pneumococcal Vaccine: Completed series  Screening Tests Health Maintenance  Topic Date Due  . INFLUENZA VACCINE  08/26/2018  . MAMMOGRAM  02/23/2019  . TETANUS/TDAP  03/14/2022 (Originally 08/22/2005)  . Fecal DNA (Cologuard)  02/08/2020  . DEXA SCAN  02/22/2022  . Hepatitis C Screening  Completed  . PNA vac Low Risk Adult  Completed    Cancer Screenings:  Colorectal Screening: Cologuard completed 02/07/17. Repeat every 3 years.   Mammogram: Completed 02/22/17. Repeat every 1-2 years as advised. Ordered today. Pt provided with contact info and advised to call to schedule appt.   Bone Density: Completed 02/22/17. Results reflect OSTEOPENIA. Repeat every 5 years.   Lung Cancer Screening: (Low Dose CT Chest recommended if Age 17-80 years, 30 pack-year currently smoking OR have quit w/in 15years.) does not qualify.   Additional Screening:  Hepatitis C Screening: Up to date  Vision Screening: Recommended annual ophthalmology exams for early detection of glaucoma and other disorders of the eye.  Dental Screening: Recommended annual dental exams for proper oral hygiene  Community Resource Referral:  CRR required this visit?  No       Plan:  I have personally reviewed and addressed the Medicare Annual Wellness questionnaire and have noted the following in the patient's chart:  A. Medical and social history B. Use of alcohol, tobacco  or illicit drugs  C. Current medications and supplements  D. Functional ability and status E.  Nutritional status F.  Physical activity G. Advance directives H. List of other physicians I.  Hospitalizations, surgeries, and ER visits in previous 12 months J.  [place] such as hearing and vision if needed, cognitive and depression L. Referrals and appointments   In addition, I have reviewed and discussed with patient certain preventive protocols, quality metrics, and best practice /recommendations. A written personalized care plan for preventive services as well as general preventive health recommendations were provided to patient. Nurse Health Advisor  Signed,    Aileene Lanum [place], [place]  08/04/6577 Nurse Health Advisor   Nurse Notes: Pt would like to receive a flu shot at next in office apt (03/16/19).

## 2019-03-14 ENCOUNTER — Ambulatory Visit (INDEPENDENT_AMBULATORY_CARE_PROVIDER_SITE_OTHER): Payer: Medicare HMO

## 2019-03-14 ENCOUNTER — Other Ambulatory Visit: Payer: Self-pay

## 2019-03-14 DIAGNOSIS — Z1231 Encounter for screening mammogram for malignant neoplasm of breast: Secondary | ICD-10-CM | POA: Diagnosis not present

## 2019-03-14 DIAGNOSIS — Z0000 Encounter for general adult medical examination without abnormal findings: Secondary | ICD-10-CM

## 2019-03-14 NOTE — Patient Instructions (Signed)
Ms. Whitney Stuart , Thank you for taking time to come for your Medicare Wellness Visit. I appreciate your ongoing commitment to your health goals. Please review the following plan we discussed and let me know if I can assist you in the future.    Screening recommendations/referrals: Colonoscopy: Cologuard up to date, due 02/08/20 Mammogram: Ordered today. Pt provided with contact info and advised to call to schedule appt.  Bone Density: Up to date, due 01/2022 Recommended yearly ophthalmology/optometry visit for glaucoma screening and checkup Recommended yearly dental visit for hygiene and checkup  Vaccinations: Influenza vaccine: Currently due, pt to receive at next in office visit. Pneumococcal vaccine: Completed series Tdap vaccine: Pt declines today.  Shingles vaccine: Pt declines today.     Advanced directives: Advance directive discussed with you today. Even though you declined this today please call our office should you change your mind and we can give you the proper paperwork for you to fill out.  Conditions/risks identified: Recommend to exercise 3 days a week for 30 minutes at a time and focus on eating a balanced healthy diet. Discussed foods to avoid.   Next appointment: 03/16/19 @ 9:20 AM with Dr Beryle Flock    Preventive Care 65 Years and Older, Female Preventive care refers to lifestyle choices and visits with your health care provider that can promote health and wellness. What does preventive care include?  A yearly physical exam. This is also called an annual well check.  Dental exams once or twice a year.  Routine eye exams. Ask your health care provider how often you should have your eyes checked.  Personal lifestyle choices, including:  Daily care of your teeth and gums.  Regular physical activity.  Eating a healthy diet.  Avoiding tobacco and drug use.  Limiting alcohol use.  Practicing safe sex.  Taking low-dose aspirin every day.  Taking vitamin and  mineral supplements as recommended by your health care provider. What happens during an annual well check? The services and screenings done by your health care provider during your annual well check will depend on your age, overall health, lifestyle risk factors, and family history of disease. Counseling  Your health care provider may ask you questions about your:  Alcohol use.  Tobacco use.  Drug use.  Emotional well-being.  Home and relationship well-being.  Sexual activity.  Eating habits.  History of falls.  Memory and ability to understand (cognition).  Work and work Astronomer.  Reproductive health. Screening  You may have the following tests or measurements:  Height, weight, and BMI.  Blood pressure.  Lipid and cholesterol levels. These may be checked every 5 years, or more frequently if you are over 43 years old.  Skin check.  Lung cancer screening. You may have this screening every year starting at age 25 if you have a 30-pack-year history of smoking and currently smoke or have quit within the past 15 years.  Fecal occult blood test (FOBT) of the stool. You may have this test every year starting at age 35.  Flexible sigmoidoscopy or colonoscopy. You may have a sigmoidoscopy every 5 years or a colonoscopy every 10 years starting at age 71.  Hepatitis C blood test.  Hepatitis B blood test.  Sexually transmitted disease (STD) testing.  Diabetes screening. This is done by checking your blood sugar (glucose) after you have not eaten for a while (fasting). You may have this done every 1-3 years.  Bone density scan. This is done to screen for osteoporosis. You may  have this done starting at age 18.  Mammogram. This may be done every 1-2 years. Talk to your health care provider about how often you should have regular mammograms. Talk with your health care provider about your test results, treatment options, and if necessary, the need for more tests. Vaccines   Your health care provider may recommend certain vaccines, such as:  Influenza vaccine. This is recommended every year.  Tetanus, diphtheria, and acellular pertussis (Tdap, Td) vaccine. You may need a Td booster every 10 years.  Zoster vaccine. You may need this after age 37.  Pneumococcal 13-valent conjugate (PCV13) vaccine. One dose is recommended after age 54.  Pneumococcal polysaccharide (PPSV23) vaccine. One dose is recommended after age 25. Talk to your health care provider about which screenings and vaccines you need and how often you need them. This information is not intended to replace advice given to you by your health care provider. Make sure you discuss any questions you have with your health care provider. Document Released: 02/07/2015 Document Revised: 10/01/2015 Document Reviewed: 11/12/2014 Elsevier Interactive Patient Education  2017 [employer] Prevention in the Home Falls can cause injuries. They can happen to people of all ages. There are many things you can do to make your home safe and to help prevent falls. What can I do on the outside of my home?  Regularly fix the edges of walkways and driveways and fix any cracks.  Remove anything that might make you trip as you walk through a door, such as a raised step or threshold.  Trim any bushes or trees on the path to your home.  Use bright outdoor lighting.  Clear any walking paths of anything that might make someone trip, such as rocks or tools.  Regularly check to see if handrails are loose or broken. Make sure that both sides of any steps have handrails.  Any raised decks and porches should have guardrails on the edges.  Have any leaves, snow, or ice cleared regularly.  Use sand or salt on walking paths during winter.  Clean up any spills in your garage right away. This includes oil or grease spills. What can I do in the bathroom?  Use night lights.  Install grab bars by the toilet and in the  tub and shower. Do not use towel bars as grab bars.  Use non-skid mats or decals in the tub or shower.  If you need to sit down in the shower, use a plastic, non-slip stool.  Keep the floor dry. Clean up any water that spills on the floor as soon as it happens.  Remove soap buildup in the tub or shower regularly.  Attach bath mats securely with double-sided non-slip rug tape.  Do not have throw rugs and other things on the floor that can make you trip. What can I do in the bedroom?  Use night lights.  Make sure that you have a light by your bed that is easy to reach.  Do not use any sheets or blankets that are too big for your bed. They should not hang down onto the floor.  Have a firm chair that has side arms. You can use this for support while you get dressed.  Do not have throw rugs and other things on the floor that can make you trip. What can I do in the kitchen?  Clean up any spills right away.  Avoid walking on wet floors.  Keep items that you use a lot in  easy-to-reach places.  If you need to reach something above you, use a strong step stool that has a grab bar.  Keep electrical cords out of the way.  Do not use floor polish or wax that makes floors slippery. If you must use wax, use non-skid floor wax.  Do not have throw rugs and other things on the floor that can make you trip. What can I do with my stairs?  Do not leave any items on the stairs.  Make sure that there are handrails on both sides of the stairs and use them. Fix handrails that are broken or loose. Make sure that handrails are as long as the stairways.  Check any carpeting to make sure that it is firmly attached to the stairs. Fix any carpet that is loose or worn.  Avoid having throw rugs at the top or bottom of the stairs. If you do have throw rugs, attach them to the floor with carpet tape.  Make sure that you have a light switch at the top of the stairs and the bottom of the stairs. If you  do not have them, ask someone to add them for you. What else can I do to help prevent falls?  Wear shoes that:  Do not have high heels.  Have rubber bottoms.  Are comfortable and fit you well.  Are closed at the toe. Do not wear sandals.  If you use a stepladder:  Make sure that it is fully opened. Do not climb a closed stepladder.  Make sure that both sides of the stepladder are locked into place.  Ask someone to hold it for you, if possible.  Clearly mark and make sure that you can see:  Any grab bars or handrails.  First and last steps.  Where the edge of each step is.  Use tools that help you move around (mobility aids) if they are needed. These include:  Canes.  Walkers.  Scooters.  Crutches.  Turn on the lights when you go into a dark area. Replace any light bulbs as soon as they burn out.  Set up your furniture so you have a clear path. Avoid moving your furniture around.  If any of your floors are uneven, fix them.  If there are any pets around you, be aware of where they are.  Review your medicines with your doctor. Some medicines can make you feel dizzy. This can increase your chance of falling. Ask your doctor what other things that you can do to help prevent falls. This information is not intended to replace advice given to you by your health care provider. Make sure you discuss any questions you have with your health care provider. Document Released: 11/07/2008 Document Revised: 06/19/2015 Document Reviewed: 02/15/2014 Elsevier Interactive Patient Education  2017 [employer].

## 2019-03-16 ENCOUNTER — Encounter: Payer: Medicare HMO | Admitting: Family Medicine

## 2019-03-26 DIAGNOSIS — Z8541 Personal history of malignant neoplasm of cervix uteri: Secondary | ICD-10-CM | POA: Diagnosis not present

## 2019-03-26 DIAGNOSIS — Z87891 Personal history of nicotine dependence: Secondary | ICD-10-CM | POA: Diagnosis not present

## 2019-03-26 DIAGNOSIS — E669 Obesity, unspecified: Secondary | ICD-10-CM | POA: Diagnosis not present

## 2019-03-26 DIAGNOSIS — Z7982 Long term (current) use of aspirin: Secondary | ICD-10-CM | POA: Diagnosis not present

## 2019-03-26 DIAGNOSIS — I10 Essential (primary) hypertension: Secondary | ICD-10-CM | POA: Diagnosis not present

## 2019-03-26 DIAGNOSIS — J45909 Unspecified asthma, uncomplicated: Secondary | ICD-10-CM | POA: Diagnosis not present

## 2019-03-26 DIAGNOSIS — G4700 Insomnia, unspecified: Secondary | ICD-10-CM | POA: Diagnosis not present

## 2019-03-26 DIAGNOSIS — E785 Hyperlipidemia, unspecified: Secondary | ICD-10-CM | POA: Diagnosis not present

## 2019-03-26 DIAGNOSIS — Z008 Encounter for other general examination: Secondary | ICD-10-CM | POA: Diagnosis not present

## 2019-04-19 ENCOUNTER — Other Ambulatory Visit: Payer: Self-pay | Admitting: Family Medicine

## 2019-04-19 DIAGNOSIS — I10 Essential (primary) hypertension: Secondary | ICD-10-CM

## 2019-04-19 DIAGNOSIS — E7800 Pure hypercholesterolemia, unspecified: Secondary | ICD-10-CM

## 2019-05-08 DIAGNOSIS — Z0100 Encounter for examination of eyes and vision without abnormal findings: Secondary | ICD-10-CM | POA: Diagnosis not present

## 2019-05-12 ENCOUNTER — Other Ambulatory Visit: Payer: Self-pay | Admitting: Family Medicine

## 2019-05-12 DIAGNOSIS — I10 Essential (primary) hypertension: Secondary | ICD-10-CM

## 2019-05-12 NOTE — Telephone Encounter (Signed)
Pt < 3 months overdue for appt and has [facility]VS, approved for 90 day courtesy refill. Requested Prescriptions  Pending Prescriptions Disp Refills  . lisinopril (ZESTRIL) 10 MG tablet [Pharmacy Med Name: LISINOPRIL 10 MG TABLET] 90 tablet 0    Sig: TAKE 1 TABLET BY MOUTH EVERY DAY     Cardiovascular:  ACE Inhibitors Failed - 05/12/2019 12:33 PM      Failed - Cr in normal range and within 180 days    Creatinine, Ser  Date Value Ref Range Status  09/12/2018 1.26 (H) 0.57 - 1.00 mg/dL Final         Failed - K in normal range and within 180 days    Potassium  Date Value Ref Range Status  09/12/2018 4.6 3.5 - 5.2 mmol/L Final         Failed - Valid encounter within last 6 months    Recent Outpatient Visits          8 months ago Essential (primary) hypertension   [facility], Marzella Schlein, [place]   1 year ago Encounter for annual physical exam   [facility] [place], Marzella Schlein, [place]   1 year ago Essential (primary) hypertension   [facility] [place], Marzella Schlein, [place]   1 year ago Acute left-sided low back pain, with sciatica presence unspecified   [facility] [place], Adriana M, [place]   2 years ago Essential (primary) hypertension   [facility] [place], Marzella Schlein, [place]             Passed - Patient is not pregnant      Passed - Last BP in normal range    BP Readings from Last 1 Encounters:  09/12/18 135/78

## 2019-05-13 ENCOUNTER — Other Ambulatory Visit: Payer: Self-pay | Admitting: Family Medicine

## 2019-05-13 DIAGNOSIS — I10 Essential (primary) hypertension: Secondary | ICD-10-CM

## 2019-05-13 NOTE — Telephone Encounter (Signed)
Pt < 3 months overdue appt, and has upcoming appt this week with PCP. Approved for 90 days no RF. Requested Prescriptions  Pending Prescriptions Disp Refills  . amLODipine (NORVASC) 10 MG tablet [Pharmacy Med Name: AMLODIPINE BESYLATE 10 MG TAB] 90 tablet 0    Sig: TAKE 1 TABLET BY MOUTH EVERY DAY     Cardiovascular:  Calcium Channel Blockers Failed - 05/13/2019  9:53 AM      Failed - Valid encounter within last 6 months    Recent Outpatient Visits          8 months ago Essential (primary) hypertension   [facility], Marzella Schlein, [place]   1 year ago Encounter for annual physical exam   [facility] [place], Marzella Schlein, [place]   1 year ago Essential (primary) hypertension   [facility] [place], Marzella Schlein, [place]   1 year ago Acute left-sided low back pain, with sciatica presence unspecified   [facility] [place], Adriana M, [place]   2 years ago Essential (primary) hypertension   [facility] [place], Marzella Schlein, MD             Passed - Last BP in normal range    BP Readings from Last 1 Encounters:  09/12/18 135/78

## 2019-05-14 NOTE — Progress Notes (Signed)
Complete physical exam  I,Whitney Stuart,acting as a scribe for Whitney Paganini, MD.,have documented all relevant documentation on the behalf of Whitney Paganini, MD,as directed by  Whitney Paganini, MD while in the presence of Whitney Paganini, MD.    Patient: Whitney Stuart   DOB: 1950-08-21   69 y.o. Female  MRN: [id number] Visit Date: 05/15/2019  Today's healthcare provider: Lavon Paganini, MD  Subjective:    Chief Complaint  Patient presents with  . Annual Exam  . Allergic Rhinitis   . Hypertension   AWE was w/ [facility]enzie on 03/14/2019 Whitney Stuart is a 68 y.o. female who presents today for a complete physical exam.  She reports consuming a general and low sodium diet. Home exercise routine includes Yardwork. She generally feels well. She reports sleeping fairly well. She does not have additional problems to discuss today.  Mammogram and done density 02/22/17 Pap smear 03/09/17  Hypertension, follow-up  BP Readings from Last 3 Encounters:  05/15/19 136/73  09/12/18 135/78  03/14/18 (!) 143/84   Wt Readings from Last 3 Encounters:  05/15/19 210 lb (95.3 kg)  09/12/18 206 lb 6.4 oz (93.6 kg)  03/14/18 209 lb (94.8 kg)     She was last seen for hypertension 6 months ago.  BP at that visit was 135/78. Management since that visit includes No Changes. She reports excellent compliance with treatment. She is not having side effects.  She is following a Regular, Low Sodium diet. She is exercising. She does not smoke.  Use of agents associated with hypertension: none.   Outside blood pressures are 120s/70-80's. Symptoms: No chest pain No chest pressure/discomfort No dyspnea (difficulty breathing) No lower extremity edema No orthopnea  No palpitations No paroxysmal nocturnal dyspnea  No syncope  Last basic metabolic panel Lab Results  Component Value Date   GLUCOSE 96 09/12/2018   NA 140 09/12/2018   K 4.6 09/12/2018   CL 103 09/12/2018   CO2 22  09/12/2018   BUN 19 09/12/2018   CREATININE 1.26 (H) 09/12/2018   GFRNONAA 44 (L) 09/12/2018   GFRAA 51 (L) 09/12/2018   CALCIUM 10.0 09/12/2018   Last lipids Lab Results  Component Value Date   CHOL 171 03/14/2018   HDL 59 03/14/2018   LDLCALC 94 03/14/2018   TRIG 90 03/14/2018   CHOLHDL 2.9 03/14/2018   The 10-year ASCVD risk score Mikey Bussing DC Jr., et al., 2013) is: 10.6%   Lab Results  Component Value Date   NA 140 09/12/2018   K 4.6 09/12/2018   CL 103 09/12/2018   CO2 22 09/12/2018   GLUCOSE 96 09/12/2018   BUN 19 09/12/2018   CREATININE 1.26 (H) 09/12/2018   CALCIUM 10.0 09/12/2018   GFRNONAA 44 (L) 09/12/2018   GFRAA 51 (L) 09/12/2018    Lab Results  Component Value Date   CHOL 171 03/14/2018   HDL 59 03/14/2018   LDLCALC 94 03/14/2018   TRIG 90 03/14/2018   CHOLHDL 2.9 03/14/2018      ---------------------------------------------------------------------------------------------------  Past Medical History:  Diagnosis Date  . Arthritis   . Asthma   . Asthma   . GERD (gastroesophageal reflux disease)   . History of cervical cancer   . Hyperlipidemia   . Hypertension    Past Surgical History:  Procedure Laterality Date  . ABDOMINAL HYSTERECTOMY  1999   total  . HAND SURGERY Bilateral 2003   carpal tunnel  . LIPOMA EXCISION     Social History  Socioeconomic History  . Marital status: Married    Spouse name: Abbee Cremeens  . Number of children: 7  . Years of education: Not on file  . Highest education level: GED or equivalent  Occupational History  . Occupation: Retired  Tobacco Use  . Smoking status: Former Smoker    Packs/day: 0.50    Years: 6.00    Pack years: 3.00    Quit date: 01/24/1997    Years since quitting: 22.3  . Smokeless tobacco: Never Used  Substance and Sexual Activity  . Alcohol use: No  . Drug use: No  . Sexual activity: Yes    Birth control/protection: Surgical  Other Topics Concern  . Not on file  Social  History Narrative  . Not on file   Social Determinants of Health   Financial Resource Strain: Low Risk   . Difficulty of Paying Living Expenses: Not hard at all  Food Insecurity: No Food Insecurity  . Worried About Programme researcher, broadcasting/film/video in the Last Year: Never true  . Ran Out of Food in the Last Year: Never true  Transportation Needs: No Transportation Needs  . Lack of Transportation (Medical): No  . Lack of Transportation (Non-Medical): No  Physical Activity: Inactive  . Days of Exercise per Week: 0 days  . Minutes of Exercise per Session: 0 min  Stress: No Stress Concern Present  . Feeling of Stress : Not at all  Social Connections: Slightly Isolated  . Frequency of Communication with Friends and Family: More than three times a week  . Frequency of Social Gatherings with Friends and Family: More than three times a week  . Attends Religious Services: More than 4 times per year  . Active Member of Clubs or Organizations: No  . Attends Banker Meetings: Never  . Marital Status: Married  Catering manager Violence: Not At Risk  . Fear of Current or Ex-Partner: No  . Emotionally Abused: No  . Physically Abused: No  . Sexually Abused: No   Family Status  Relation Name Status  . Father  Deceased at age 41       kidney disease heavy smoker  . Mother  Deceased at age 7       stroke  . Sister  Alive  . Brother  Deceased at age 62       lung cancer  . Brother  Alive  . Mat Aunt  (Not Specified)  . Sister  Alive  . Sister  Alive  . Brother  Alive  . Brother  Alive  . Brother  Alive  . Brother  Alive  . Brother  Alive  . Brother  Alive  . Neg Hx  (Not Specified)   Family History  Problem Relation Age of Onset  . Hypertension Father   . Kidney disease Father   . Heart disease Mother   . Stroke Mother   . Congestive Heart Failure Mother   . Hypertension Mother   . Heart murmur Sister   . Hypertension Sister   . Lung cancer Brother   . Hypertension Brother    . Hypertension Brother   . Sleep apnea Brother   . Mental illness Maternal Aunt   . Cancer Sister        ovarian  . Hypertension Sister   . Hypertension Sister   . Hypertension Brother   . Heart disease Brother   . Hypertension Brother   . Hypertension Brother   . Hypertension Brother   .  Hypertension Brother   . Hypertension Brother   . Colon cancer Neg Hx   . Breast cancer Neg Hx    No Known Allergies  Patient Care Team: Erasmo Downer, MD as PCP - General (Family Medicine) Elliot Cousin, MD as Consulting Physician (Ophthalmology)   Medications: Outpatient Medications Prior to Visit  Medication Sig  . albuterol (VENTOLIN HFA) 108 (90 Base) MCG/ACT inhaler TAKE 2 PUFFS BY MOUTH EVERY 6 HOURS AS NEEDED FOR WHEEZE OR SHORTNESS OF BREATH  . aspirin 81 MG tablet Take 81 mg by mouth daily.  . Calcium Carb-Cholecalciferol (CALCIUM 600+D3 PO) Take by mouth.  . loratadine (CLARITIN) 10 MG tablet Take 10 mg by mouth daily.  . [DISCONTINUED] amLODipine (NORVASC) 10 MG tablet TAKE 1 TABLET BY MOUTH EVERY DAY  . [DISCONTINUED] atorvastatin (LIPITOR) 10 MG tablet TAKE 1 TABLET BY MOUTH EVERY DAY  . [DISCONTINUED] lisinopril (ZESTRIL) 10 MG tablet TAKE 1 TABLET BY MOUTH EVERY DAY  . [DISCONTINUED] traZODone (DESYREL) 100 MG tablet TAKE 1 TABLET BY MOUTH EVERY DAY AT BEDTIME AS NEEDED FOR SLEEP   No facility-administered medications prior to visit.    Review of Systems  Constitutional: Negative.   HENT: Negative.   Eyes: Negative.   Respiratory: Negative.   Cardiovascular: Positive for leg swelling. Negative for chest pain and palpitations.  Gastrointestinal: Negative.   Endocrine: Negative.   Genitourinary: Negative.   Musculoskeletal: Positive for neck stiffness. Negative for arthralgias, back pain, gait problem, joint swelling, myalgias and neck pain.  Skin: Negative.   Allergic/Immunologic: Positive for environmental allergies. Negative for food allergies and  immunocompromised state.  Neurological: Negative.   Hematological: Negative.   Psychiatric/Behavioral: Negative.     Last CBC Lab Results  Component Value Date   WBC 7.9 08/24/2017   HGB 12.8 08/24/2017   HCT 36.4 08/24/2017   MCV 89.4 08/24/2017   MCH 31.4 08/24/2017   RDW 12.7 08/24/2017   PLT 325 08/24/2017   Last metabolic panel Lab Results  Component Value Date   GLUCOSE 96 09/12/2018   NA 140 09/12/2018   K 4.6 09/12/2018   CL 103 09/12/2018   CO2 22 09/12/2018   BUN 19 09/12/2018   CREATININE 1.26 (H) 09/12/2018   GFRNONAA 44 (L) 09/12/2018   GFRAA 51 (L) 09/12/2018   CALCIUM 10.0 09/12/2018   PROT 7.6 03/14/2018   ALBUMIN 4.4 03/14/2018   LABGLOB 3.2 03/14/2018   AGRATIO 1.4 03/14/2018   BILITOT 0.3 03/14/2018   ALKPHOS 77 03/14/2018   AST 11 03/14/2018   ALT 19 03/14/2018   ANIONGAP 9 08/24/2017   Last lipids Lab Results  Component Value Date   CHOL 171 03/14/2018   HDL 59 03/14/2018   LDLCALC 94 03/14/2018   TRIG 90 03/14/2018   CHOLHDL 2.9 03/14/2018   Last hemoglobin A1c No results found for: HGBA1C Last thyroid functions Lab Results  Component Value Date   TSH 0.888 11/22/2014   Last vitamin D No results found for: 25OHVITD2, 25OHVITD3, VD25OH Last vitamin B12 and Folate No results found for: VITAMINB12, FOLATE      Objective:    BP 136/73 (BP Location: Right Arm, Patient Position: Sitting, Cuff Size: Large)   Pulse 69   Temp (!) 97.5 F (36.4 C) (Temporal)   Ht 5\' 5"  (1.651 m)   Wt 210 lb (95.3 kg)   BMI 34.95 kg/m  BP Readings from Last 3 Encounters:  05/15/19 136/73  09/12/18 135/78  03/14/18 (!) 143/84  Physical Exam Constitutional:      General: She is not in acute distress.    Appearance: Normal appearance. She is well-developed. She is not diaphoretic.  HENT:     Head: Normocephalic and atraumatic.     Right Ear: Tympanic membrane, ear canal and external ear normal.     Left Ear: Tympanic membrane, ear canal  and external ear normal.     Mouth/Throat:     Pharynx: Uvula midline.  Eyes:     General: Lids are normal.     Conjunctiva/sclera: Conjunctivae normal.     Pupils: Pupils are equal, round, and reactive to light.  Neck:     Thyroid: No thyroid mass or thyromegaly.     Vascular: No carotid bruit.     Trachea: Trachea normal.  Cardiovascular:     Rate and Rhythm: Normal rate and regular rhythm.     Pulses: Normal pulses.     Heart sounds: Normal heart sounds.  Pulmonary:     Effort: Pulmonary effort is normal. No respiratory distress.     Breath sounds: Normal breath sounds. No wheezing.  Abdominal:     General: There is no distension.     Palpations: Abdomen is soft.     Tenderness: There is no abdominal tenderness. There is no guarding or rebound.  Musculoskeletal:        General: Normal range of motion.     Cervical back: Normal range of motion and neck supple.     Right lower leg: No edema.     Left lower leg: No edema.  Lymphadenopathy:     Cervical: No cervical adenopathy.  Skin:    General: Skin is warm and dry.     Findings: No rash.  Neurological:     Mental Status: She is alert and oriented to person, place, and time. Mental status is at baseline.     Cranial Nerves: No cranial nerve deficit.  Psychiatric:        Speech: Speech normal.        Behavior: Behavior normal.        Thought Content: Thought content normal.        Judgment: Judgment normal.      Depression Screen  PHQ 2/9 Scores 03/14/2019 03/07/2018 03/07/2018  PHQ - 2 Score 0 0 0  PHQ- 9 Score - 4 -    No results found for any visits on 05/15/19.    Assessment & Plan:    Routine Health Maintenance and Physical Exam  Exercise Activities and Dietary recommendations Goals    . Exercise 150 minutes per week (moderate activity)       Immunization History  Administered Date(s) Administered  . Influenza, High Dose Seasonal PF 01/12/2017, 03/07/2018  . Pneumococcal Conjugate-13 01/12/2017  .  Pneumococcal Polysaccharide-23 03/07/2018  . Td 08/23/1995    Health Maintenance  Topic Date Due  . COVID-19 Vaccine (1) Never done  . MAMMOGRAM  02/23/2019  . TETANUS/TDAP  03/14/2022 (Originally 08/22/2005)  . INFLUENZA VACCINE  08/26/2019  . Fecal DNA (Cologuard)  02/08/2020  . DEXA SCAN  02/22/2022  . Hepatitis C Screening  Completed  . PNA vac Low Risk Adult  Completed    Discussed health benefits of physical activity, and encouraged her to engage in regular exercise appropriate for her age and condition.  Problem List Items Addressed This Visit      Cardiovascular and Mediastinum   Essential (primary) hypertension - Primary    Well controlled Continue current  medications Recheck metabolic panel F/u in 6 months       Relevant Medications   amLODipine (NORVASC) 10 MG tablet   lisinopril (ZESTRIL) 10 MG tablet   atorvastatin (LIPITOR) 10 MG tablet     Respiratory   Seasonal allergic rhinitis    Continued taking loratadine 10mg  as needed.  Discussed also adding OTC Flonase Consider wearing a mask outside when doing yard work.           Musculoskeletal and Integument   Muscle spasms of neck    Baclofen caused pt to have vertigo  Will try cyclobenzaprine 5mg  at bedtime as needed.  Dicussed potential side effects for cyclobenzaprine such as dizziness, increased fall risk, and sleepiness.           Other   Pure hypercholesterolemia    Previously well controlled Continue statin Repeat FLP and CMP Goal LDL < 70      Relevant Medications   amLODipine (NORVASC) 10 MG tablet   lisinopril (ZESTRIL) 10 MG tablet   atorvastatin (LIPITOR) 10 MG tablet   Other Relevant Orders   Comprehensive metabolic panel   Lipid panel   Insomnia    Chronic and stable.  Continue Trazodone as needed.       Relevant Medications   traZODone (DESYREL) 100 MG tablet   Obesity    Discussed importance of healthy weight management Discussed diet and exercise        Other  Visit Diagnoses    Encounter for annual wellness exam in Medicare patient       Relevant Orders   Comprehensive metabolic panel   Lipid panel       Return in about 6 months (around 11/14/2019) for Chronic disease follow up.     I, , MD, have reviewed all documentation for this visit. The documentation on 05/15/19 for the exam, diagnosis, procedures, and orders are all accurate and complete.   Mareli Antunes, Shirlee Latch, MD, MPH [facility]

## 2019-05-15 ENCOUNTER — Other Ambulatory Visit: Payer: Self-pay

## 2019-05-15 ENCOUNTER — Encounter: Payer: Self-pay | Admitting: Family Medicine

## 2019-05-15 ENCOUNTER — Ambulatory Visit (INDEPENDENT_AMBULATORY_CARE_PROVIDER_SITE_OTHER): Payer: Medicare HMO | Admitting: Family Medicine

## 2019-05-15 VITALS — BP 136/73 | HR 69 | Temp 97.5°F | Ht 65.0 in | Wt 210.0 lb

## 2019-05-15 DIAGNOSIS — Z0000 Encounter for general adult medical examination without abnormal findings: Secondary | ICD-10-CM

## 2019-05-15 DIAGNOSIS — Z6834 Body mass index (BMI) 34.0-34.9, adult: Secondary | ICD-10-CM

## 2019-05-15 DIAGNOSIS — M62838 Other muscle spasm: Secondary | ICD-10-CM | POA: Diagnosis not present

## 2019-05-15 DIAGNOSIS — E669 Obesity, unspecified: Secondary | ICD-10-CM

## 2019-05-15 DIAGNOSIS — G4700 Insomnia, unspecified: Secondary | ICD-10-CM | POA: Diagnosis not present

## 2019-05-15 DIAGNOSIS — E7800 Pure hypercholesterolemia, unspecified: Secondary | ICD-10-CM

## 2019-05-15 DIAGNOSIS — I10 Essential (primary) hypertension: Secondary | ICD-10-CM

## 2019-05-15 DIAGNOSIS — J302 Other seasonal allergic rhinitis: Secondary | ICD-10-CM | POA: Insufficient documentation

## 2019-05-15 MED ORDER — TRAZODONE HCL 100 MG PO TABS: 100.0000 mg | ORAL_TABLET | Freq: Every evening | ORAL | 3 refills | Status: DC | PRN

## 2019-05-15 MED ORDER — AMLODIPINE BESYLATE 10 MG PO TABS: 10.0000 mg | ORAL_TABLET | Freq: Every day | ORAL | 3 refills | Status: DC

## 2019-05-15 MED ORDER — ATORVASTATIN CALCIUM 10 MG PO TABS: 10.0000 mg | ORAL_TABLET | Freq: Every day | ORAL | 3 refills | Status: DC

## 2019-05-15 MED ORDER — LISINOPRIL 10 MG PO TABS: 10.0000 mg | ORAL_TABLET | Freq: Every day | ORAL | 3 refills | Status: DC

## 2019-05-15 MED ORDER — CYCLOBENZAPRINE HCL 5 MG PO TABS: 5.0000 mg | ORAL_TABLET | Freq: Every evening | ORAL | 0 refills | Status: DC | PRN

## 2019-05-15 NOTE — Assessment & Plan Note (Signed)
Discussed importance of healthy weight management Discussed diet and exercise  

## 2019-05-15 NOTE — Assessment & Plan Note (Signed)
Well controlled Continue current medications Recheck metabolic panel F/u in 6 months  

## 2019-05-15 NOTE — Assessment & Plan Note (Addendum)
Continued taking loratadine 10mg  as needed.  Discussed also adding OTC Flonase Consider wearing a mask outside when doing yard work.

## 2019-05-15 NOTE — Assessment & Plan Note (Signed)
Previously well controlled Continue statin Repeat FLP and CMP Goal LDL < 70 

## 2019-05-15 NOTE — Assessment & Plan Note (Signed)
Chronic and stable.  Continue Trazodone as needed.

## 2019-05-15 NOTE — Assessment & Plan Note (Addendum)
Baclofen caused pt to have vertigo  Will try cyclobenzaprine 5mg  at bedtime as needed.  Dicussed potential side effects for cyclobenzaprine such as dizziness, increased fall risk, and sleepiness.

## 2019-05-15 NOTE — Patient Instructions (Addendum)
 The CDC recommends two doses of Shingrix (the shingles vaccine) separated by 2 to 6 months for adults age 68 years and older. I recommend checking with your insurance plan regarding coverage for this vaccine.     Preventive Care 65 Years and Older, Female Preventive care refers to lifestyle choices and visits with your health care provider that can promote health and wellness. This includes:  A yearly physical exam. This is also called an annual well check.  Regular dental and eye exams.  Immunizations.  Screening for certain conditions.  Healthy lifestyle choices, such as diet and exercise. What can I expect for my preventive care visit? Physical exam Your health care provider will check:  Height and weight. These may be used to calculate body mass index (BMI), which is a measurement that tells if you are at a healthy weight.  Heart rate and blood pressure.  Your skin for abnormal spots. Counseling Your health care provider may ask you questions about:  Alcohol, tobacco, and drug use.  Emotional well-being.  Home and relationship well-being.  Sexual activity.  Eating habits.  History of falls.  Memory and ability to understand (cognition).  Work and work environment.  Pregnancy and menstrual history. What immunizations do I need?  Influenza (flu) vaccine  This is recommended every year. Tetanus, diphtheria, and pertussis (Tdap) vaccine  You may need a Td booster every 10 years. Varicella (chickenpox) vaccine  You may need this vaccine if you have not already been vaccinated. Zoster (shingles) vaccine  You may need this after age 60. Pneumococcal conjugate (PCV13) vaccine  One dose is recommended after age 65. Pneumococcal polysaccharide (PPSV23) vaccine  One dose is recommended after age 65. Measles, mumps, and rubella (MMR) vaccine  You may need at least one dose of MMR if you were born in 1957 or later. You may also need a second  dose. Meningococcal conjugate (MenACWY) vaccine  You may need this if you have certain conditions. Hepatitis A vaccine  You may need this if you have certain conditions or if you travel or work in places where you may be exposed to hepatitis A. Hepatitis B vaccine  You may need this if you have certain conditions or if you travel or work in places where you may be exposed to hepatitis B. Haemophilus influenzae type b (Hib) vaccine  You may need this if you have certain conditions. You may receive vaccines as individual doses or as more than one vaccine together in one shot (combination vaccines). Talk with your health care provider about the risks and benefits of combination vaccines. What tests do I need? Blood tests  Lipid and cholesterol levels. These may be checked every 5 years, or more frequently depending on your overall health.  Hepatitis C test.  Hepatitis B test. Screening  Lung cancer screening. You may have this screening every year starting at age 55 if you have a 30-pack-year history of smoking and currently smoke or have quit within the past 15 years.  Colorectal cancer screening. All adults should have this screening starting at age 68 and continuing until age 75. Your health care provider may recommend screening at age 45 if you are at increased risk. You will have tests every 1-10 years, depending on your results and the type of screening test.  Diabetes screening. This is done by checking your blood sugar (glucose) after you have not eaten for a while (fasting). You may have this done every 1-3 years.  Mammogram. This may   be done every 1-2 years. Talk with your health care provider about how often you should have regular mammograms.  BRCA-related cancer screening. This may be done if you have a family history of breast, ovarian, tubal, or peritoneal cancers. Other tests  Sexually transmitted disease (STD) testing.  Bone density scan. This is done to screen for  osteoporosis. You may have this done starting at age 65. Follow these instructions at home: Eating and drinking  Eat a diet that includes fresh fruits and vegetables, whole grains, lean protein, and low-fat dairy products. Limit your intake of foods with high amounts of sugar, saturated fats, and salt.  Take vitamin and mineral supplements as recommended by your health care provider.  Do not drink alcohol if your health care provider tells you not to drink.  If you drink alcohol: ? Limit how much you have to 0-1 drink a day. ? Be aware of how much alcohol is in your drink. In the [place] one drink equals one 12 oz bottle of beer (355 mL), one 5 oz glass of wine (148 mL), or one 1 oz glass of hard liquor (44 mL). Lifestyle  Take daily care of your teeth and gums.  Stay active. Exercise for at least 30 minutes on 5 or more days each week.  Do not use any products that contain nicotine or tobacco, such as cigarettes, e-cigarettes, and chewing tobacco. If you need help quitting, ask your health care provider.  If you are sexually active, practice safe sex. Use a condom or other form of protection in order to prevent STIs (sexually transmitted infections).  Talk with your health care provider about taking a low-dose aspirin or statin. What's next?  Go to your health care provider once a year for a well check visit.  Ask your health care provider how often you should have your eyes and teeth checked.  Stay up to date on all vaccines. This information is not intended to replace advice given to you by your health care provider. Make sure you discuss any questions you have with your health care provider. Document Revised: 01/05/2018 Document Reviewed: 01/05/2018 Elsevier Patient Education  2020 [employer]  

## 2019-05-16 ENCOUNTER — Telehealth: Payer: Self-pay

## 2019-05-16 LAB — COMPREHENSIVE METABOLIC PANEL
ALT: 14 IU/L (ref 0–32)
AST: 11 IU/L (ref 0–40)
Albumin/Globulin Ratio: 1.4 (ref 1.2–2.2)
Albumin: 4.4 g/dL (ref 3.8–4.8)
Alkaline Phosphatase: 77 IU/L (ref 39–117)
BUN/Creatinine Ratio: 12 (ref 12–28)
BUN: 15 mg/dL (ref 8–27)
Bilirubin Total: 0.6 mg/dL (ref 0.0–1.2)
CO2: 20 mmol/L (ref 20–29)
Calcium: 10.2 mg/dL (ref 8.7–10.3)
Chloride: 102 mmol/L (ref 96–106)
Creatinine, Ser: 1.25 mg/dL — ABNORMAL HIGH (ref 0.57–1.00)
GFR calc Af Amer: 51 mL/min/{1.73_m2} — ABNORMAL LOW (ref >59)
GFR calc non Af Amer: 44 mL/min/{1.73_m2} — ABNORMAL LOW (ref >59)
Globulin, Total: 3.1 g/dL (ref 1.5–4.5)
Glucose: 87 mg/dL (ref 65–99)
Potassium: 4.4 mmol/L (ref 3.5–5.2)
Sodium: 140 mmol/L (ref 134–144)
Total Protein: 7.5 g/dL (ref 6.0–8.5)

## 2019-05-16 LAB — LIPID PANEL
Chol/HDL Ratio: 2.5 ratio (ref 0.0–4.4)
Cholesterol, Total: 155 mg/dL (ref 100–199)
HDL: 61 mg/dL (ref >39)
LDL Chol Calc (NIH): 81 mg/dL (ref 0–99)
Triglycerides: 67 mg/dL (ref 0–149)
VLDL Cholesterol Cal: 13 mg/dL (ref 5–40)

## 2019-05-16 NOTE — Telephone Encounter (Signed)
Pt advised.   Thanks,   -Laura  

## 2019-05-16 NOTE — Telephone Encounter (Signed)
-----   Message from Erasmo Downer, MD sent at 05/16/2019  2:26 PM EDT ----- Normal/stable labs

## 2019-06-01 ENCOUNTER — Encounter: Payer: Self-pay | Admitting: Family Medicine

## 2019-06-01 ENCOUNTER — Ambulatory Visit (INDEPENDENT_AMBULATORY_CARE_PROVIDER_SITE_OTHER): Payer: Medicare HMO | Admitting: Family Medicine

## 2019-06-01 ENCOUNTER — Other Ambulatory Visit: Payer: Self-pay

## 2019-06-01 VITALS — Temp 97.5°F | Resp 16 | Wt 210.0 lb

## 2019-06-01 DIAGNOSIS — S41111A Laceration without foreign body of right upper arm, initial encounter: Secondary | ICD-10-CM | POA: Diagnosis not present

## 2019-06-01 DIAGNOSIS — D692 Other nonthrombocytopenic purpura: Secondary | ICD-10-CM

## 2019-06-01 DIAGNOSIS — Z23 Encounter for immunization: Secondary | ICD-10-CM | POA: Diagnosis not present

## 2019-06-01 NOTE — Progress Notes (Signed)
Established patient visit   Patient: Whitney Stuart   DOB: 01-02-1951   69 y.o. Female  MRN: [id number] Visit Date: 06/01/2019  I,Sulibeya S Dimas,acting as a scribe for Lavon Paganini, MD.,have documented all relevant documentation on the behalf of Lavon Paganini, MD,as directed by  Lavon Paganini, MD while in the presence of Lavon Paganini, MD.  Today's healthcare provider: Lavon Paganini, MD   Chief Complaint  Patient presents with  . Laceration    on right arm   Subjective    HPI Patient here today with laceration on right arm. Patient reports cut happened yesterday with rusty tin. Patient denies any fever, abnormal body aches. Patient has been keeping clean with peroxide. She is not up-to-date on her tetanus vaccine   Patient Active Problem List   Diagnosis Date Noted  . Senile purpura ([place]) 06/01/2019  . Seasonal allergic rhinitis 05/15/2019  . Asymptomatic microscopic hematuria 09/06/2017  . Muscle spasms of neck 03/09/2017  . Obesity 02/09/2017  . Calculus of gallbladder 11/21/2014  . Arthralgia of hip 11/21/2014  . Insomnia 11/21/2014  . Pure hypercholesterolemia 04/11/2008  . Asthma, exogenous 03/28/2008  . Essential (primary) hypertension 03/28/2008  . Personal history of malignant neoplasm of cervix uteri 03/28/2008   Social History   Tobacco Use  . Smoking status: Former Smoker    Packs/day: 0.50    Years: 6.00    Pack years: 3.00    Quit date: 01/24/1997    Years since quitting: 22.3  . Smokeless tobacco: Never Used  Substance Use Topics  . Alcohol use: No  . Drug use: No       Medications: Outpatient Medications Prior to Visit  Medication Sig  . albuterol (VENTOLIN HFA) 108 (90 Base) MCG/ACT inhaler TAKE 2 PUFFS BY MOUTH EVERY 6 HOURS AS NEEDED FOR WHEEZE OR SHORTNESS OF BREATH  . amLODipine (NORVASC) 10 MG tablet Take 1 tablet (10 mg total) by mouth daily.  Marland Kitchen aspirin 81 MG tablet Take 81 mg by mouth daily.  Marland Kitchen  atorvastatin (LIPITOR) 10 MG tablet Take 1 tablet (10 mg total) by mouth daily.  . Calcium Carb-Cholecalciferol (CALCIUM 600+D3 PO) Take by mouth.  . cyclobenzaprine (FLEXERIL) 5 MG tablet Take 1 tablet (5 mg total) by mouth at bedtime as needed for muscle spasms.  Marland Kitchen lisinopril (ZESTRIL) 10 MG tablet Take 1 tablet (10 mg total) by mouth daily.  Marland Kitchen loratadine (CLARITIN) 10 MG tablet Take 10 mg by mouth daily.  . traZODone (DESYREL) 100 MG tablet Take 1 tablet (100 mg total) by mouth at bedtime as needed for sleep.   No facility-administered medications prior to visit.    Review of Systems  Constitutional: Negative.   Respiratory: Negative.   Cardiovascular: Negative.   Skin: Positive for wound.      Objective    Temp (!) 97.5 F (36.4 C) (Temporal)   Resp 16   Wt 210 lb (95.3 kg)   BMI 34.95 kg/m    Physical Exam Vitals reviewed.  Constitutional:      General: She is not in acute distress.    Appearance: She is well-developed.  HENT:     Head: Normocephalic and atraumatic.  Eyes:     General: No scleral icterus.    Conjunctiva/sclera: Conjunctivae normal.  Cardiovascular:     Rate and Rhythm: Normal rate and regular rhythm.  Pulmonary:     Effort: Pulmonary effort is normal. No respiratory distress.  Skin:    General: Skin is warm  and dry.     Findings: Bruising and lesion (2cm laceration on right forearm with no surrounding erythema) present.  Neurological:     Mental Status: She is alert and oriented to person, place, and time.  Psychiatric:        Behavior: Behavior normal.     No results found for any visits on 06/01/19.  Assessment & Plan     1. Laceration of right upper extremity, initial encounter -New laceration on rusty metal -Not up-to-date on tetanus vaccine -Td vaccine given today Td vaccine greater than or equal to 7yo preservative free IM  2. Senile purpura (HCC) -Continue to monitor  3. Need for vaccine for Td (tetanus-diphtheria) Patient  tolerated well, and observed for 15 minutes  - Td vaccine greater than or equal to 7yo preservative free IM   Return if symptoms worsen or fail to improve.      I, Shirlee Latch, MD, have reviewed all documentation for this visit. The documentation on 06/01/19 for the exam, diagnosis, procedures, and orders are all accurate and complete.   Senaya Dicenso, Marzella Schlein, MD, MPH [facility]

## 2019-06-17 ENCOUNTER — Other Ambulatory Visit: Payer: Self-pay | Admitting: Family Medicine

## 2019-06-17 NOTE — Telephone Encounter (Signed)
Requested medication (s) are due for refill today: yes  Requested medication (s) are on the active medication list: yes  Last refill:  05/15/19  Future visit scheduled: yes  Notes to clinic:  med not delegated to NT to refill   Requested Prescriptions  Pending Prescriptions Disp Refills   cyclobenzaprine (FLEXERIL) 5 MG tablet [Pharmacy Med Name: CYCLOBENZAPRINE 5 MG TABLET] 30 tablet 0    Sig: Take 1 tablet (5 mg total) by mouth at bedtime as needed for muscle spasms.      Not Delegated - Analgesics:  Muscle Relaxants Failed - 06/17/2019 10:21 AM      Failed - This refill cannot be delegated      Passed - Valid encounter within last 6 months    Recent Outpatient Visits           2 weeks ago Laceration of right upper extremity, initial encounter   [facility], Marzella Schlein, MD   1 month ago Essential (primary) hypertension   [facility] [place], Marzella Schlein, MD   9 months ago Essential (primary) hypertension   [facility], Marzella Schlein, MD   1 year ago Encounter for annual physical exam   [facility], Marzella Schlein, MD   1 year ago Essential (primary) hypertension   [facility] Bacigal[place], Marzella Schlein, MD       Future Appointments             In 5 months Bacigalupo, Marzella Schlein, MD [facility]

## 2019-09-17 ENCOUNTER — Telehealth: Payer: Self-pay | Admitting: Family Medicine

## 2019-09-17 NOTE — Telephone Encounter (Signed)
Patient requesting to speak with Dr. Beryle Flock. Patient states that her husband has recently passed away and would like to discuss a medication for anxiety.

## 2019-09-17 NOTE — Telephone Encounter (Signed)
Would you like to schedule an OV for pt?

## 2019-09-17 NOTE — Telephone Encounter (Signed)
So sorry to hear about that.  Could we do a virtual visit tomorrow AM at 9.  Maybe prechart this afternoon so its ready to go in the morning?

## 2019-09-17 NOTE — Progress Notes (Addendum)
MyChart Video Visit    Virtual Visit via Video Note   This visit type was conducted due to national recommendations for restrictions regarding the COVID-19 Pandemic (e.g. social distancing) in an effort to limit this patient's exposure and mitigate transmission in our community. This patient is at least at moderate risk for complications without adequate follow up. This format is felt to be most appropriate for this patient at this time. Physical exam was limited by quality of the video and audio technology used for the visit.    Patient location: home Provider location: [address] Persons involved in the visit: patient, provider  I discussed the limitations of evaluation and management by telemedicine and the availability of in person appointments. The patient expressed understanding and agreed to proceed.   Patient: Whitney Stuart   DOB: 1950-03-06   69 y.o. Female  MRN: [id number] Visit Date: 09/18/2019  Today's healthcare provider: Shirlee Latch, MD   Chief Complaint  Patient presents with  . Anxiety   Subjective    HPI  Anxiety Patient here today C/O anxiety due to her husband passing away recently.  He passed quickly.   Symptoms: No chest pain Yes difficulty concentrating  No dizziness Yes fatigue  No feelings of losing control Yes insomnia  Yes irritable Yes palpitations  Yes panic attacks Yes racing thoughts  Yes shortness of breath No sweating  No tremors/shakes    GAD-7 Results GAD-7 Generalized Anxiety Disorder Screening Tool 09/18/2019  1. Feeling Nervous, Anxious, or on Edge 3  2. Not Being Able to Stop or Control Worrying 3  3. Worrying Too Much About Different Things 2  4. Trouble Relaxing 3  5. Being So Restless it's Hard To Sit Still 2  6. Becoming Easily Annoyed or Irritable 0  7. Feeling Afraid As If Something Awful Might Happen 0  Total GAD-7 Score 13  Difficulty At Work, Home, or Getting  Along With Others? Very difficult      PHQ-9 Scores PHQ9 SCORE ONLY 09/18/2019 03/14/2019 03/07/2018  PHQ-9 Total Score 14 0 4    ---------------------------------------------------------------------------------------------------   Patient Active Problem List   Diagnosis Date Noted  . Complicated grief 09/18/2019  . Senile purpura (HCC) 06/01/2019  . Seasonal allergic rhinitis 05/15/2019  . Asymptomatic microscopic hematuria 09/06/2017  . Muscle spasms of neck 03/09/2017  . Obesity 02/09/2017  . Calculus of gallbladder 11/21/2014  . Arthralgia of hip 11/21/2014  . Insomnia 11/21/2014  . Pure hypercholesterolemia 04/11/2008  . Asthma, exogenous 03/28/2008  . Essential (primary) hypertension 03/28/2008  . Personal history of malignant neoplasm of cervix uteri 03/28/2008   Social History   Tobacco Use  . Smoking status: Former Smoker    Packs/day: 0.50    Years: 6.00    Pack years: 3.00    Quit date: 01/24/1997    Years since quitting: 22.6  . Smokeless tobacco: Never Used  Vaping Use  . Vaping Use: Never used  Substance Use Topics  . Alcohol use: No  . Drug use: No   No Known Allergies    Medications: Outpatient Medications Prior to Visit  Medication Sig  . cyclobenzaprine (FLEXERIL) 5 MG tablet TAKE 1 TABLET (5 MG TOTAL) BY MOUTH AT BEDTIME AS NEEDED FOR MUSCLE SPASMS.  Marland Kitchen albuterol (VENTOLIN HFA) 108 (90 Base) MCG/ACT inhaler TAKE 2 PUFFS BY MOUTH EVERY 6 HOURS AS NEEDED FOR WHEEZE OR SHORTNESS OF BREATH  . amLODipine (NORVASC) 10 MG tablet Take 1 tablet (10 mg total) by  mouth daily.  Marland Kitchen aspirin 81 MG tablet Take 81 mg by mouth daily.  Marland Kitchen atorvastatin (LIPITOR) 10 MG tablet Take 1 tablet (10 mg total) by mouth daily.  . Calcium Carb-Cholecalciferol (CALCIUM 600+D3 PO) Take by mouth.  Marland Kitchen lisinopril (ZESTRIL) 10 MG tablet Take 1 tablet (10 mg total) by mouth daily.  Marland Kitchen loratadine (CLARITIN) 10 MG tablet Take 10 mg by mouth daily.  . traZODone (DESYREL) 100 MG tablet Take 1 tablet (100 mg total) by mouth  at bedtime as needed for sleep.   No facility-administered medications prior to visit.    Review of Systems  Constitutional: Positive for activity change, appetite change and fatigue.  HENT: Negative.   Respiratory: Positive for shortness of breath.   Cardiovascular: Positive for palpitations.  Gastrointestinal: Negative.   Neurological: Negative.   Psychiatric/Behavioral: Positive for dysphoric mood and sleep disturbance. Negative for decreased concentration, hallucinations, self-injury and suicidal ideas. The patient is nervous/anxious. The patient is not hyperactive.       Objective    There were no vitals taken for this visit.   Physical Exam Constitutional:      General: She is not in acute distress.    Appearance: Normal appearance.  Pulmonary:     Effort: Pulmonary effort is normal. No respiratory distress.  Neurological:     Mental Status: She is alert and oriented to person, place, and time. Mental status is at baseline.  Psychiatric:        Mood and Affect: Mood is anxious and depressed. Affect is flat.        Speech: Speech normal.        Behavior: Behavior normal.        Thought Content: Thought content does not include homicidal or suicidal ideation.      Assessment & Plan     Problem List Items Addressed This Visit      Other   Insomnia    Chronic and previously well controlled with as needed trazodone Worsening and uncontrolled since the passing of her husband Related to her anxiety and complicated grief Hold trazodone and trial of clonazepam as above      Complicated grief - Primary    Due to recent death of her husband Encouraged grief therapy Given resources Start SSRI Will Start Zoloft 50 mg daily Discussed potential side effects, incl GI upset, sexual dysfunction, increased anxiety, and SI Discussed that it can take 6-8 weeks to reach full efficacy Contracted for safety - no SI/HI Discussed synergistic effects of medications and therapy   Small amount of Klonopin given to take as needed for acute anxiety Discussed that there are harms associated with this medication and its addictive nature Discussed that we do not want this to be a long-term medication and that when the SSRI is at goal, we should be able to DC the benzo          Return in about 4 weeks (around 10/16/2019) for MDD/GAD f/u.     I discussed the assessment and treatment plan with the patient. The patient was provided an opportunity to ask questions and all were answered. The patient agreed with the plan and demonstrated an understanding of the instructions.   The patient was advised to call back or seek an in-person evaluation if the symptoms worsen or if the condition fails to improve as anticipated.    Jatoya Armbrister, Marzella Schlein, MD, MPH [facility]

## 2019-09-17 NOTE — Telephone Encounter (Signed)
Done

## 2019-09-18 ENCOUNTER — Telehealth (INDEPENDENT_AMBULATORY_CARE_PROVIDER_SITE_OTHER): Payer: Medicare HMO | Admitting: Family Medicine

## 2019-09-18 ENCOUNTER — Other Ambulatory Visit: Payer: Self-pay | Admitting: Family Medicine

## 2019-09-18 DIAGNOSIS — F5104 Psychophysiologic insomnia: Secondary | ICD-10-CM | POA: Diagnosis not present

## 2019-09-18 DIAGNOSIS — F4329 Adjustment disorder with other symptoms: Secondary | ICD-10-CM | POA: Diagnosis not present

## 2019-09-18 DIAGNOSIS — R69 Illness, unspecified: Secondary | ICD-10-CM | POA: Diagnosis not present

## 2019-09-18 DIAGNOSIS — F4321 Adjustment disorder with depressed mood: Secondary | ICD-10-CM

## 2019-09-18 MED ORDER — SERTRALINE HCL 50 MG PO TABS: 50.0000 mg | ORAL_TABLET | Freq: Every day | ORAL | 3 refills | Status: DC

## 2019-09-18 MED ORDER — CLONAZEPAM 0.5 MG PO TABS: 0.2500 mg | ORAL_TABLET | Freq: Two times a day (BID) | ORAL | 0 refills | Status: DC | PRN

## 2019-09-18 NOTE — Patient Instructions (Signed)
Complicated Grief Grief is a normal response to the death of someone close to you. Feelings of fear, anger, and guilt can affect almost everyone who loses a loved one. It is also common to have symptoms of depression while you are grieving. These include problems with sleep, loss of appetite, and lack of energy. They may last for weeks or months after a loss. Complicated grief is different from normal grief or depression. Normal grieving involves sadness and feelings of loss, but those feelings get better and heal over time. Complicated grief is a severe type of grief that lasts for a long time, usually for several months to a year or longer. It interferes with your ability to function normally. Complicated grief may require treatment from a mental health care provider. What are the causes? The cause of this condition is not known. It is not clear why some people continue to struggle with grief and others do not. What increases the risk? You are more likely to develop this condition if:  The death of your loved one was sudden or unexpected.  The death of your loved one was due to a violent event.  Your loved one died from suicide.  Your loved one was a child or a young person.  You were very close to your loved one, or you were dependent on him or her.  You have a history of depression or anxiety. What are the signs or symptoms? Symptoms of this condition include:  Feeling disbelief or having a lack of emotion (numbness).  Being unable to enjoy good memories of your loved one.  Needing to avoid anything or anyone that reminds you of your loved one.  Being unable to stop thinking about the death.  Feeling intense anger or guilt.  Feeling alone and hopeless.  Feeling that your life is meaningless and empty.  Losing the desire to move on with your life. How is this diagnosed? This condition may be diagnosed based on:  Your symptoms. Complicated grief will be diagnosed if you have  ongoing symptoms of grief for 6-12 months or longer.  The effect of symptoms on your life. You may be diagnosed with this condition if your symptoms are interfering with your ability to live your life. Your health care provider may recommend that you see a mental health care provider. Many symptoms of depression are similar to the symptoms of complicated grief. It is important to be evaluated for complicated grief along with other mental health conditions. How is this treated? This condition is most commonly treated with talk therapy. This therapy is offered by a mental health specialist (psychiatrist). During therapy:  You will learn healthy ways to cope with the loss of your loved one.  Your mental health care provider may recommend antidepressant medicines. Follow these instructions at home: Lifestyle   Take care of yourself. ? Eat on a regular basis, and maintain a healthy diet. Eat plenty of fruits, vegetables, lean protein, and whole grains. ? Try to get some exercise each day. Aim for 30 minutes of exercise on most days of the week. ? Keep a consistent sleep schedule. Try to get 8 or more hours of sleep each night. ? Start doing the things that you used to enjoy.  Do not use drugs or alcohol to ease your symptoms.  Spend time with friends and loved ones. General instructions  Take over-the-counter and prescription medicines only as told by your health care provider.  Consider joining a grief (bereavement) support group   to help you deal with your loss.  Keep all follow-up visits as told by your health care provider. This is important. Contact a health care provider if:  Your symptoms prevent you from functioning normally.  Your symptoms do not get better with treatment. Get help right away if:  You have serious thoughts about hurting yourself or someone else.  You have suicidal feelings. If you ever feel like you may hurt yourself or others, or have thoughts about taking  your own life, get help right away. You can go to your nearest emergency department or call:  Your local emergency services (911 in the [place]  A suicide crisis helpline, such as the National Suicide Prevention Lifeline at 1-800-273-8255. This is open 24 hours a day. Summary  Complicated grief is a severe type of grief that lasts for a long time. This grief is not likely to go away on its own. Get the help you need.  Some griefs are more difficult than others and can cause this condition. You may need a certain type of treatment to help you recover if the loss of your loved one was sudden, violent, or due to suicide.  You may feel guilty about moving on with your life. Getting help does not mean that you are forgetting your loved one. It means that you are taking care of yourself.  Complicated grief is best treated with talk therapy. Medicines may also be prescribed.  Seek the help you need, and find support that will help you recover. This information is not intended to replace advice given to you by your health care provider. Make sure you discuss any questions you have with your health care provider. Document Revised: 12/24/2016 Document Reviewed: 10/27/2016 Elsevier Patient Education  2020 [employer] Inc[employer]  

## 2019-09-18 NOTE — Telephone Encounter (Signed)
Requested medication (s) are due for refill today: yes  Requested medication (s) are on the active medication list: yes  Last refill:  06/18/19  Future visit scheduled: yes in 1 month  Notes to clinic:  not delegated per protocol     Requested Prescriptions  Pending Prescriptions Disp Refills   cyclobenzaprine (FLEXERIL) 5 MG tablet [Pharmacy Med Name: CYCLOBENZAPRINE 5 MG TABLET] 30 tablet 0    Sig: TAKE 1 TABLET (5 MG TOTAL) BY MOUTH AT BEDTIME AS NEEDED FOR MUSCLE SPASMS.      Not Delegated - Analgesics:  Muscle Relaxants Failed - 09/18/2019  1:46 PM      Failed - This refill cannot be delegated      Passed - Valid encounter within last 6 months    Recent Outpatient Visits           Today Complicated grief   [facility], Marzella Schlein, MD   3 months ago Laceration of right upper extremity, initial encounter   [facility], Marzella Schlein, MD   4 months ago Essential (primary) hypertension   [facility] [place], Marzella Schlein, MD   1 year ago Essential (primary) hypertension   [facility] [place], Marzella Schlein, MD   1 year ago Encounter for annual physical exam   [facility] [place], Marzella Schlein, MD       Future Appointments             In 1 month Bacigalupo, Marzella Schlein, MD [facility]

## 2019-09-18 NOTE — Assessment & Plan Note (Signed)
Due to recent death of her husband Encouraged grief therapy Given resources Start SSRI Will Start Zoloft 50 mg daily Discussed potential side effects, incl GI upset, sexual dysfunction, increased anxiety, and SI Discussed that it can take 6-8 weeks to reach full efficacy Contracted for safety - no SI/HI Discussed synergistic effects of medications and therapy  Small amount of Klonopin given to take as needed for acute anxiety Discussed that there are harms associated with this medication and its addictive nature Discussed that we do not want this to be a long-term medication and that when the SSRI is at goal, we should be able to DC the benzo

## 2019-09-18 NOTE — Assessment & Plan Note (Signed)
Chronic and previously well controlled with as needed trazodone Worsening and uncontrolled since the passing of her husband Related to her anxiety and complicated grief Hold trazodone and trial of clonazepam as above

## 2019-09-20 ENCOUNTER — Other Ambulatory Visit: Payer: Self-pay | Admitting: Family Medicine

## 2019-09-20 ENCOUNTER — Telehealth: Payer: Self-pay | Admitting: Family Medicine

## 2019-09-20 NOTE — Telephone Encounter (Signed)
Patient has been experiencing loss of appetite, nausea and diarrhea since she started taking the following:  sertraline (ZOLOFT) 50 MG tablet clonazePAM (KLONOPIN) 0.5 MG tablet    Please call back at [phone] (M).  Thanks, [employer]

## 2019-09-21 MED ORDER — ONDANSETRON HCL 4 MG PO TABS: 4.0000 mg | ORAL_TABLET | Freq: Three times a day (TID) | ORAL | 0 refills | Status: DC | PRN

## 2019-09-21 NOTE — Telephone Encounter (Signed)
Patient advised as below. Patient verbalizes understanding and is in agreement with treatment plan.  

## 2019-09-21 NOTE — Telephone Encounter (Signed)
Likely from the Zoloft. OK to cut it in half. Can also give Zofran if she wants.

## 2019-10-10 ENCOUNTER — Other Ambulatory Visit: Payer: Self-pay | Admitting: Family Medicine

## 2019-10-10 NOTE — Telephone Encounter (Signed)
Requested medication (s) are due for refill today: No  Requested medication (s) are on the active medication list: Yes  Last refill:  09/18/19 # 30 with 3 refills.  Future visit scheduled: Yes  Notes to clinic:  Pharmacy requests 90 day supply.    Requested Prescriptions  Pending Prescriptions Disp Refills   sertraline (ZOLOFT) 50 MG tablet [Pharmacy Med Name: SERTRALINE HCL 50 MG TABLET] 90 tablet 2    Sig: TAKE 1 TABLET BY MOUTH EVERY DAY      Psychiatry:  Antidepressants - SSRI Passed - 10/10/2019  1:31 PM      Passed - Valid encounter within last 6 months    Recent Outpatient Visits           3 weeks ago Complicated grief   [facility] [place], Marzella Schlein, MD   4 months ago Laceration of right upper extremity, initial encounter   [facility] [place], Marzella Schlein, MD   4 months ago Essential (primary) hypertension   [facility] [place], Marzella Schlein, MD   1 year ago Essential (primary) hypertension   [facility] [place], Marzella Schlein, MD   1 year ago Encounter for annual physical exam   [facility] [place], Marzella Schlein, MD       Future Appointments             In 2 weeks Bacigalupo, Marzella Schlein, MD [facility]

## 2019-10-25 ENCOUNTER — Other Ambulatory Visit: Payer: Self-pay

## 2019-10-25 ENCOUNTER — Encounter: Payer: Self-pay | Admitting: Family Medicine

## 2019-10-25 ENCOUNTER — Ambulatory Visit (INDEPENDENT_AMBULATORY_CARE_PROVIDER_SITE_OTHER): Payer: Medicare HMO | Admitting: Family Medicine

## 2019-10-25 VITALS — BP 133/76 | HR 62 | Temp 98.5°F | Wt 206.0 lb

## 2019-10-25 DIAGNOSIS — F4329 Adjustment disorder with other symptoms: Secondary | ICD-10-CM | POA: Diagnosis not present

## 2019-10-25 DIAGNOSIS — E669 Obesity, unspecified: Secondary | ICD-10-CM | POA: Diagnosis not present

## 2019-10-25 DIAGNOSIS — Z23 Encounter for immunization: Secondary | ICD-10-CM

## 2019-10-25 DIAGNOSIS — F5104 Psychophysiologic insomnia: Secondary | ICD-10-CM

## 2019-10-25 DIAGNOSIS — F4321 Adjustment disorder with depressed mood: Secondary | ICD-10-CM

## 2019-10-25 DIAGNOSIS — R69 Illness, unspecified: Secondary | ICD-10-CM | POA: Diagnosis not present

## 2019-10-25 DIAGNOSIS — Z1231 Encounter for screening mammogram for malignant neoplasm of breast: Secondary | ICD-10-CM

## 2019-10-25 DIAGNOSIS — Z6834 Body mass index (BMI) 34.0-34.9, adult: Secondary | ICD-10-CM | POA: Diagnosis not present

## 2019-10-25 DIAGNOSIS — E7800 Pure hypercholesterolemia, unspecified: Secondary | ICD-10-CM | POA: Diagnosis not present

## 2019-10-25 DIAGNOSIS — I10 Essential (primary) hypertension: Secondary | ICD-10-CM | POA: Diagnosis not present

## 2019-10-25 DIAGNOSIS — D692 Other nonthrombocytopenic purpura: Secondary | ICD-10-CM

## 2019-10-25 DIAGNOSIS — N1831 Chronic kidney disease, stage 3a: Secondary | ICD-10-CM | POA: Diagnosis not present

## 2019-10-25 MED ORDER — SERTRALINE HCL 50 MG PO TABS
50.0000 mg | ORAL_TABLET | ORAL | 2 refills | Status: DC
Start: 2019-10-25 — End: 2020-07-17

## 2019-10-25 NOTE — Progress Notes (Signed)
Established patient visit   Patient: Whitney Stuart   DOB: 27-Jan-1950   69 y.o. Female  MRN: [id number] Visit Date: 10/25/2019  Today's healthcare provider: Shirlee Latch, MD   Chief Complaint  Patient presents with  . Depression  . Anxiety   Subjective    HPI   Depression, Anxiety Follow-up Related to husband's death. Accepts his death and that he is in a better place.  She  was last seen for this 1 months ago. Changes made at last visit include starting zoloft 50mg  daily, small amount of Klonopin for acute anxiety.    She reports poor compliance with treatment. Takes sertraline every other day and 1/2 klonopin only when she needs it (most recently last night). She is having side effects. Diarrhea, upset stomach, no appetite, and couldn't sleep due to the sertraline. Better since taking sertraline every other day.  She reports fair tolerance of treatment. Stomach being upset is gone. She feels she is Improved since last visit.  No counseling for grief. Listens to prayers on youtube.  Depression screen [facility] 2/9 10/25/2019 09/18/2019 03/14/2019  Decreased Interest 1 3 0  Down, Depressed, Hopeless 0 2 0  PHQ - 2 Score 1 5 0  Altered sleeping 1 3 -  Tired, decreased energy 1 3 -  Change in appetite 1 3 -  Feeling bad or failure about yourself  0 0 -  Trouble concentrating 0 0 -  Moving slowly or fidgety/restless 0 0 -  Suicidal thoughts 0 0 -  PHQ-9 Score 4 14 -  Difficult doing work/chores Not difficult at all Very difficult -     GAD 7 : Generalized Anxiety Score 10/25/2019 09/18/2019  Nervous, Anxious, on Edge 0 3  Control/stop worrying 0 3  Worry too much - different things 0 2  Trouble relaxing 1 3  Restless 0 2  Easily annoyed or irritable 0 0  Afraid - awful might happen 0 0  Total GAD 7 Score 1 13  Anxiety Difficulty Not difficult at all Very difficult    -----------------------------------------------------------------------------------------  Hypertension, follow-up  BP Readings from Last 3 Encounters:  10/25/19 133/76  05/15/19 136/73  09/12/18 135/78   Wt Readings from Last 3 Encounters:  10/25/19 206 lb (93.4 kg)  06/01/19 210 lb (95.3 kg)  05/15/19 210 lb (95.3 kg)     She was last seen for hypertension 5 months ago.  BP at that visit was 136/73. Management since that visit includes continue amlodipine, lisinopril.  She reports excellent compliance with treatment. She is not having side effects.  She is following a Low Sodium diet. She is exercising. Not much in the last month but getting back to it. She does not smoke. Former smoker.  Use of agents associated with hypertension: none.   Symptoms: No chest pain Yes chest pressure  No palpitations No syncope  Yes dyspnea No orthopnea  No paroxysmal nocturnal dyspnea Yes lower extremity edema   Pertinent labs: Lab Results  Component Value Date   CHOL 155 05/15/2019   HDL 61 05/15/2019   LDLCALC 81 05/15/2019   TRIG 67 05/15/2019   CHOLHDL 2.5 05/15/2019   Lab Results  Component Value Date   NA 140 05/15/2019   K 4.4 05/15/2019   CREATININE 1.25 (H) 05/15/2019   GFRNONAA 44 (L) 05/15/2019   GFRAA 51 (L) 05/15/2019   GLUCOSE 87 05/15/2019     The 10-year ASCVD risk score 05/17/2019 DC Jr., et al., 2013) is: 9.7%   ---------------------------------------------------------------------------------------------------  Lipid/Cholesterol, Follow-up  Other pertinent labs  Lab Results  Component Value Date   ALT 14 05/15/2019   AST 11 05/15/2019   PLT 325 08/24/2017   TSH 0.888 11/22/2014     She was last seen for this 5 months ago.  Management since that visit includes continue atorvastatin 10mg .  She reports excellent compliance with treatment. She is not having side effects.   Symptoms: No chest pain No chest pressure/discomfort  Yes dyspnea Yes lower  extremity edema  No numbness or tingling of extremity No orthopnea  No palpitations No paroxysmal nocturnal dyspnea  No speech difficulty No syncope   Current diet: low salt Current exercise: yard work  The 10-year ASCVD risk score DC Jr., et al., 2013) is: 9.7%  ---------------------------------------------------------------------------------------------------  Follow up for Insomnia:  The patient was last seen for this 09/18/2019.  Changes made at last visit include holding Trazodone and starting trial of Clonazepam.  She reports good compliance with treatment. She feels that condition is Improved. Slightly, but better this last month. She is not having side effects.   -----------------------------------------------------------------------------------------  Past Medical History:  Diagnosis Date  . Arthritis   . Asthma   . Asthma   . GERD (gastroesophageal reflux disease)   . History of cervical cancer   . Hyperlipidemia   . Hypertension        Medications: Outpatient Medications Prior to Visit  Medication Sig  . albuterol (VENTOLIN HFA) 108 (90 Base) MCG/ACT inhaler TAKE 2 PUFFS BY MOUTH EVERY 6 HOURS AS NEEDED FOR WHEEZE OR SHORTNESS OF BREATH  . amLODipine (NORVASC) 10 MG tablet Take 1 tablet (10 mg total) by mouth daily.  09/20/2019 aspirin 81 MG tablet Take 81 mg by mouth daily.  Marland Kitchen atorvastatin (LIPITOR) 10 MG tablet Take 1 tablet (10 mg total) by mouth daily.  . Calcium Carb-Cholecalciferol (CALCIUM 600+D3 PO) Take by mouth.  . clonazePAM (KLONOPIN) 0.5 MG tablet Take 0.5-1 tablets (0.25-0.5 mg total) by mouth 2 (two) times daily as needed for anxiety.  . cyclobenzaprine (FLEXERIL) 5 MG tablet TAKE 1 TABLET (5 MG TOTAL) BY MOUTH AT BEDTIME AS NEEDED FOR MUSCLE SPASMS.  Marland Kitchen lisinopril (ZESTRIL) 10 MG tablet Take 1 tablet (10 mg total) by mouth daily.  Marland Kitchen loratadine (CLARITIN) 10 MG tablet Take 10 mg by mouth daily.  . ondansetron (ZOFRAN) 4 MG tablet Take 1 tablet (4  mg total) by mouth every 8 (eight) hours as needed for nausea or vomiting.  . sertraline (ZOLOFT) 50 MG tablet TAKE 1 TABLET BY MOUTH EVERY DAY  . traZODone (DESYREL) 100 MG tablet Take 1 tablet (100 mg total) by mouth at bedtime as needed for sleep.   No facility-administered medications prior to visit.    Review of Systems  Respiratory: Positive for chest tightness and shortness of breath.   Cardiovascular: Positive for leg swelling. Negative for chest pain and palpitations.  Gastrointestinal: Positive for abdominal pain and diarrhea.  Neurological: Negative for syncope, speech difficulty, light-headedness and numbness.  Psychiatric/Behavioral: Positive for decreased concentration and sleep disturbance. Negative for self-injury and suicidal ideas.    Last CBC Lab Results  Component Value Date   WBC 7.9 08/24/2017   HGB 12.8 08/24/2017   HCT 36.4 08/24/2017   MCV 89.4 08/24/2017   MCH 31.4 08/24/2017   RDW 12.7 08/24/2017   PLT 325 08/24/2017   Last metabolic panel Lab Results  Component Value Date   GLUCOSE 87 05/15/2019   NA 140 05/15/2019   K  4.4 05/15/2019   CL 102 05/15/2019   CO2 20 05/15/2019   BUN 15 05/15/2019   CREATININE 1.25 (H) 05/15/2019   GFRNONAA 44 (L) 05/15/2019   GFRAA 51 (L) 05/15/2019   CALCIUM 10.2 05/15/2019   PROT 7.5 05/15/2019   ALBUMIN 4.4 05/15/2019   LABGLOB 3.1 05/15/2019   AGRATIO 1.4 05/15/2019   BILITOT 0.6 05/15/2019   ALKPHOS 77 05/15/2019   AST 11 05/15/2019   ALT 14 05/15/2019   ANIONGAP 9 08/24/2017   Last lipids Lab Results  Component Value Date   CHOL 155 05/15/2019   HDL 61 05/15/2019   LDLCALC 81 05/15/2019   TRIG 67 05/15/2019   CHOLHDL 2.5 05/15/2019   Last hemoglobin A1c No results found for: HGBA1C    Objective    BP 133/76 (BP Location: Left Arm, Patient Position: Sitting, Cuff Size: Large)   Pulse 62   Temp 98.5 F (36.9 C) (Oral)   Wt 206 lb (93.4 kg)   BMI 34.28 kg/m     Physical  Exam Constitutional:      General: She is not in acute distress.    Appearance: She is obese. She is not ill-appearing, toxic-appearing or diaphoretic.  HENT:     Head: Normocephalic and atraumatic.     Right Ear: External ear normal.     Left Ear: External ear normal.     Nose: Nose normal.  Neck:     Vascular: No carotid bruit.  Cardiovascular:     Rate and Rhythm: Normal rate and regular rhythm.     Pulses: Normal pulses.     Heart sounds: Normal heart sounds. No murmur heard.  No friction rub. No gallop.   Pulmonary:     Effort: Pulmonary effort is normal. No respiratory distress.     Breath sounds: Normal breath sounds. No stridor. No wheezing.  Abdominal:     Palpations: Abdomen is soft.     Tenderness: There is no abdominal tenderness. There is no guarding.  Musculoskeletal:     Cervical back: Neck supple. No rigidity or tenderness.     Right lower leg: Edema present.     Left lower leg: Edema present.  Lymphadenopathy:     Cervical: No cervical adenopathy.  Skin:    General: Skin is warm and dry.     Findings: No bruising.  Neurological:     General: No focal deficit present.     Mental Status: She is alert.     Cranial Nerves: No cranial nerve deficit.     Motor: No weakness.     Gait: Gait normal.  Psychiatric:        Mood and Affect: Mood normal.        Behavior: Behavior normal.        Thought Content: Thought content normal.        Judgment: Judgment normal.     No results found for any visits on 10/25/19.  Assessment & Plan     Problem List Items Addressed This Visit      Cardiovascular and Mediastinum   Essential (primary) hypertension    Well controlled Continue current medications (amlodipine, lisinopril) Recheck metabolic panel F/u in 6 months      Relevant Orders   Comprehensive metabolic panel   Senile purpura (HCC)    No current bruises Provided reassurance        Genitourinary   Chronic kidney disease, stage 3a (HCC)    Check  CMP today  Other   Pure hypercholesterolemia    Previously well controlled Goal LDL <70 Continue statin Repeat FLP and CMP today      Relevant Orders   Comprehensive metabolic panel   Lipid panel   Insomnia    Chronic and improving since husband has passed Continue trazodone Hold trazodone if taking clonazepam for anxiety      Obesity    Weight down 4 lbs since May 2021 Discussed diet and exercise      Complicated grief - Primary    Due to recent death of her husband Acceptance phase of grief Not interested in therapy Taking zoloft 50mg  every other day Occasionally taking 1/2 pill of klonopin for acute anxiety Contract for safety- no SI/HI Continue medications as above F/U in 6 months, consider taper off zoloft at that time       Other Visit Diagnoses    Need for influenza vaccination       Relevant Orders   Flu Vaccine QUAD High Dose(Fluad) (Completed)      Return in about 6 months (around 04/23/2020) for CPE, AWV.       04/25/2020, MS3   Patient seen along with MS[employer] student Higinio Roger. I personally evaluated this patient along with the student, and verified all aspects of the history, physical exam, and medical decision making as documented by the student. I agree with the student's documentation and have made all necessary edits.  Haydon Kalmar, Higinio Roger, MD, MPH [facility]

## 2019-10-25 NOTE — Assessment & Plan Note (Signed)
Weight down 4 lbs since May 2021 Discussed diet and exercise

## 2019-10-25 NOTE — Assessment & Plan Note (Signed)
No current bruises Provided reassurance

## 2019-10-25 NOTE — Assessment & Plan Note (Signed)
Check CMP today 

## 2019-10-25 NOTE — Assessment & Plan Note (Signed)
Due to recent death of her husband Acceptance phase of grief Not interested in therapy Taking zoloft 50mg  every other day Occasionally taking 1/2 pill of klonopin for acute anxiety Contract for safety- no SI/HI Continue medications as above F/U in 6 months, consider taper off zoloft at that time

## 2019-10-25 NOTE — Assessment & Plan Note (Signed)
Chronic and improving since husband has passed Continue trazodone Hold trazodone if taking clonazepam for anxiety

## 2019-10-25 NOTE — Assessment & Plan Note (Signed)
Previously well controlled Goal LDL <70 Continue statin Repeat FLP and CMP today

## 2019-10-25 NOTE — Addendum Note (Signed)
Addended by: Erasmo Downer on: 10/25/2019 01:09 PM   Modules accepted: Orders

## 2019-10-25 NOTE — Assessment & Plan Note (Signed)
Well controlled Continue current medications (amlodipine, lisinopril) Recheck metabolic panel F/u in 6 months

## 2019-10-26 ENCOUNTER — Telehealth: Payer: Self-pay

## 2019-10-26 LAB — COMPREHENSIVE METABOLIC PANEL
ALT: 12 IU/L (ref 0–32)
AST: 9 IU/L (ref 0–40)
Albumin/Globulin Ratio: 1.5 (ref 1.2–2.2)
Albumin: 4.4 g/dL (ref 3.8–4.8)
Alkaline Phosphatase: 67 IU/L (ref 44–121)
BUN/Creatinine Ratio: 17 (ref 12–28)
BUN: 17 mg/dL (ref 8–27)
Bilirubin Total: 0.5 mg/dL (ref 0.0–1.2)
CO2: 22 mmol/L (ref 20–29)
Calcium: 10.1 mg/dL (ref 8.7–10.3)
Chloride: 107 mmol/L — ABNORMAL HIGH (ref 96–106)
Creatinine, Ser: 0.98 mg/dL (ref 0.57–1.00)
GFR calc Af Amer: 69 mL/min/{1.73_m2} (ref >59)
GFR calc non Af Amer: 59 mL/min/{1.73_m2} — ABNORMAL LOW (ref >59)
Globulin, Total: 2.9 g/dL (ref 1.5–4.5)
Glucose: 93 mg/dL (ref 65–99)
Potassium: 4.7 mmol/L (ref 3.5–5.2)
Sodium: 142 mmol/L (ref 134–144)
Total Protein: 7.3 g/dL (ref 6.0–8.5)

## 2019-10-26 LAB — LIPID PANEL
Chol/HDL Ratio: 2.9 ratio (ref 0.0–4.4)
Cholesterol, Total: 156 mg/dL (ref 100–199)
HDL: 53 mg/dL (ref >39)
LDL Chol Calc (NIH): 88 mg/dL (ref 0–99)
Triglycerides: 79 mg/dL (ref 0–149)
VLDL Cholesterol Cal: 15 mg/dL (ref 5–40)

## 2019-10-26 NOTE — Telephone Encounter (Signed)
-----   Message from Erasmo Downer, MD sent at 10/26/2019  8:00 AM EDT ----- Normal labs

## 2019-10-26 NOTE — Telephone Encounter (Signed)
Left message advising pt.  (Per DPR)  Thanks,   -Morris Longenecker  

## 2019-11-09 ENCOUNTER — Ambulatory Visit: Payer: Self-pay | Admitting: *Deleted

## 2019-11-09 NOTE — Telephone Encounter (Signed)
C/o episode of SOB and elevated heart rate fluctuating over the last 2 weeks. Reports episode of feeling bad at night and patient checked O2 sat for 49% with in the past 2 weeks and after taking deep breaths went up to the high 80s. Reports heart rate in the 150s while laying in bed. Last night patient "felt weird" and reports checking B/P for 108/65 and heart rate at 59. Patient reports some chest tightness and heaviness with episodes. Denies chest pain or low O2 at this time. Reports mild swelling of legs and ankles at times. Denies difficulty breathing now but wanted to bring attention to PCP. Virtual appt made for 11/27/19. Patient unsure she can be successful at a virtual visit. Please advise if OV permitted due to SOB. Care advise given. Patient verbalized understanding of care advise and to call back or go to [facility] or ED if symptoms worsen.   Reason for Disposition  [1] MILD longstanding difficulty breathing AND [2]  SAME as normal  Answer Assessment - Initial Assessment Questions 1. RESPIRATORY STATUS: "Describe your breathing?" (e.g., wheezing, shortness of breath, unable to speak, severe coughing)      Ok now but has had episodes of SOB over the past 2 weeks 2. ONSET: "When did this breathing problem begin?"      Over past 2 weeks  3. PATTERN "Does the difficult breathing come and go, or has it been constant since it started?"      Comes and goes 4. SEVERITY: "How bad is your breathing?" (e.g., mild, moderate, severe)    - MILD: No SOB at rest, mild SOB with walking, speaks normally in sentences, can lay down, no retractions, pulse < 100.    - MODERATE: SOB at rest, SOB with minimal exertion and prefers to sit, cannot lie down flat, speaks in phrases, mild retractions, audible wheezing, pulse 100-120.    - SEVERE: Very SOB at rest, speaks in single words, struggling to breathe, sitting hunched forward, retractions, pulse > 120      Mild  5. RECURRENT SYMPTOM: "Have you had difficulty  breathing before?" If Yes, ask: "When was the last time?" and "What happened that time?"      na 6. CARDIAC HISTORY: "Do you have any history of heart disease?" (e.g., heart attack, angina, bypass surgery, angioplasty)      No family hx of CHF 7. LUNG HISTORY: "Do you have any history of lung disease?"  (e.g., pulmonary embolus, asthma, emphysema)     No  8. CAUSE: "What do you think is causing the breathing problem?"      Not sure  9. OTHER SYMPTOMS: "Do you have any other symptoms? (e.g., dizziness, runny nose, cough, chest pain, fever)     Chest tightness and heaviness at times 10. PREGNANCY: "Is there any chance you are pregnant?" "When was your last menstrual period?"       na 11. TRAVEL: "Have you traveled out of the country in the last month?" (e.g., travel history, exposures)       na  Protocols used: BREATHING DIFFICULTY-A-AH

## 2019-11-12 NOTE — Telephone Encounter (Signed)
Sounds like she should be seen much sooner than 11/2.

## 2019-11-14 ENCOUNTER — Ambulatory Visit: Payer: Medicare HMO | Admitting: Family Medicine

## 2019-11-14 ENCOUNTER — Telehealth (INDEPENDENT_AMBULATORY_CARE_PROVIDER_SITE_OTHER): Payer: Medicare Other | Admitting: Physician Assistant

## 2019-11-14 ENCOUNTER — Other Ambulatory Visit: Payer: Self-pay

## 2019-11-14 DIAGNOSIS — R0600 Dyspnea, unspecified: Secondary | ICD-10-CM

## 2019-11-14 DIAGNOSIS — R0609 Other forms of dyspnea: Secondary | ICD-10-CM

## 2019-11-14 DIAGNOSIS — R0789 Other chest pain: Secondary | ICD-10-CM | POA: Diagnosis not present

## 2019-11-14 DIAGNOSIS — F5101 Primary insomnia: Secondary | ICD-10-CM

## 2019-11-14 DIAGNOSIS — J4520 Mild intermittent asthma, uncomplicated: Secondary | ICD-10-CM | POA: Diagnosis not present

## 2019-11-14 DIAGNOSIS — R0602 Shortness of breath: Secondary | ICD-10-CM | POA: Diagnosis not present

## 2019-11-14 MED ORDER — BELSOMRA 10 MG PO TABS: 10.0000 mg | ORAL_TABLET | Freq: Every evening | ORAL | 0 refills | Status: DC | PRN

## 2019-11-14 MED ORDER — FLUTICASONE FUROATE-VILANTEROL 200-25 MCG/INH IN AEPB: 1.0000 | INHALATION_SPRAY | Freq: Every day | RESPIRATORY_TRACT | 0 refills | Status: DC

## 2019-11-14 MED ORDER — ALBUTEROL SULFATE HFA 108 (90 BASE) MCG/ACT IN AERS: INHALATION_SPRAY | RESPIRATORY_TRACT | 1 refills | Status: DC

## 2019-11-14 NOTE — Telephone Encounter (Signed)
Patient needs to be seen in person.   May need EKG

## 2019-11-14 NOTE — Progress Notes (Signed)
MyChart Video Visit    Virtual Visit via Video Note   This visit type was conducted due to national recommendations for restrictions regarding the COVID-19 Pandemic (e.g. social distancing) in an effort to limit this patient's exposure and mitigate transmission in our community. This patient is at least at moderate risk for complications without adequate follow up. This format is felt to be most appropriate for this patient at this time. Physical exam was limited by quality of the video and audio technology used for the visit.   Patient location: Home Provider location: [facility]  I discussed the limitations of evaluation and management by telemedicine and the availability of in person appointments. The patient expressed understanding and agreed to proceed.  Patient: Whitney Stuart   DOB: [birth year]   69 y.o. Female  MRN: [id number] Visit Date: 11/14/2019  Today's healthcare provider: Margaretann Loveless, PA-C   Chief Complaint  Patient presents with  . Shortness of Breath   Subjective    Shortness of Breath This is a new problem. The current episode started 1 to 4 weeks ago. The problem occurs daily. The problem has been waxing and waning. Pertinent negatives include no abdominal pain, claudication, ear pain, fever, headaches, hemoptysis, leg pain, leg swelling, orthopnea, rhinorrhea, sore throat, sputum production, swollen glands or wheezing. The symptoms are aggravated by any activity. Risk factors include smoking. She has tried ipratropium inhalers and rest for the symptoms. The treatment provided mild relief. Her past medical history is significant for allergies, chronic lung disease and COPD.    Reports symptoms of SOB and chest tightness occur with any activity. Symptoms do improve with rest. Can have some SOB with rest as well.  Patient Active Problem List   Diagnosis Date Noted  . Chronic kidney disease, stage 3a (HCC) 10/25/2019  . Complicated grief 09/18/2019  . Senile  purpura (HCC) 06/01/2019  . Seasonal allergic rhinitis 05/15/2019  . Asymptomatic microscopic hematuria 09/06/2017  . Muscle spasms of neck 03/09/2017  . Obesity 02/09/2017  . Calculus of gallbladder 11/21/2014  . Arthralgia of hip 11/21/2014  . Insomnia 11/21/2014  . Pure hypercholesterolemia 04/11/2008  . Asthma, exogenous 03/28/2008  . Essential (primary) hypertension 03/28/2008  . Personal history of malignant neoplasm of cervix uteri 03/28/2008   Past Medical History:  Diagnosis Date  . Arthritis   . Asthma   . Asthma   . GERD (gastroesophageal reflux disease)   . History of cervical cancer   . Hyperlipidemia   . Hypertension       Medications: Outpatient Medications Prior to Visit  Medication Sig  . albuterol (VENTOLIN HFA) 108 (90 Base) MCG/ACT inhaler TAKE 2 PUFFS BY MOUTH EVERY 6 HOURS AS NEEDED FOR WHEEZE OR SHORTNESS OF BREATH  . amLODipine (NORVASC) 10 MG tablet Take 1 tablet (10 mg total) by mouth daily.  Marland Kitchen aspirin 81 MG tablet Take 81 mg by mouth daily.  Marland Kitchen atorvastatin (LIPITOR) 10 MG tablet Take 1 tablet (10 mg total) by mouth daily.  . Calcium Carb-Cholecalciferol (CALCIUM 600+D3 PO) Take by mouth.  . clonazePAM (KLONOPIN) 0.5 MG tablet Take 0.5-1 tablets (0.25-0.5 mg total) by mouth 2 (two) times daily as needed for anxiety.  . cyclobenzaprine (FLEXERIL) 5 MG tablet TAKE 1 TABLET (5 MG TOTAL) BY MOUTH AT BEDTIME AS NEEDED FOR MUSCLE SPASMS.  Marland Kitchen lisinopril (ZESTRIL) 10 MG tablet Take 1 tablet (10 mg total) by mouth daily.  Marland Kitchen loratadine (CLARITIN) 10 MG tablet Take 10 mg by mouth daily.  Marland Kitchen  ondansetron (ZOFRAN) 4 MG tablet Take 1 tablet (4 mg total) by mouth every 8 (eight) hours as needed for nausea or vomiting.  . sertraline (ZOLOFT) 50 MG tablet Take 1 tablet (50 mg total) by mouth every other day.  . traZODone (DESYREL) 100 MG tablet Take 1 tablet (100 mg total) by mouth at bedtime as needed for sleep.   No facility-administered medications prior to visit.     Review of Systems  Constitutional: Negative.  Negative for fever.  HENT: Negative for ear pain, rhinorrhea and sore throat.   Respiratory: Positive for cough, chest tightness and shortness of breath. Negative for apnea, hemoptysis, sputum production, choking, wheezing and stridor.   Cardiovascular: Negative.  Negative for orthopnea, claudication and leg swelling.  Gastrointestinal: Negative.  Negative for abdominal pain.  Neurological: Negative for dizziness, light-headedness and headaches.    Last CBC Lab Results  Component Value Date   WBC 7.9 08/24/2017   HGB 12.8 08/24/2017   HCT 36.4 08/24/2017   MCV 89.4 08/24/2017   MCH 31.4 08/24/2017   RDW 12.7 08/24/2017   PLT 325 08/24/2017   Last metabolic panel Lab Results  Component Value Date   GLUCOSE 93 10/25/2019   NA 142 10/25/2019   K 4.7 10/25/2019   CL 107 (H) 10/25/2019   CO2 22 10/25/2019   BUN 17 10/25/2019   CREATININE 0.98 10/25/2019   GFRNONAA 59 (L) 10/25/2019   GFRAA 69 10/25/2019   CALCIUM 10.1 10/25/2019   PROT 7.3 10/25/2019   ALBUMIN 4.4 10/25/2019   LABGLOB 2.9 10/25/2019   AGRATIO 1.5 10/25/2019   BILITOT 0.5 10/25/2019   ALKPHOS 67 10/25/2019   AST 9 10/25/2019   ALT 12 10/25/2019   ANIONGAP 9 08/24/2017      Objective    There were no vitals taken for this visit. BP Readings from Last 3 Encounters:  10/25/19 133/76  05/15/19 136/73  09/12/18 135/78   Wt Readings from Last 3 Encounters:  10/25/19 206 lb (93.4 kg)  06/01/19 210 lb (95.3 kg)  05/15/19 210 lb (95.3 kg)      Physical Exam     Assessment & Plan     1. Mild intermittent extrinsic asthma without complication Worsening. Will add Breo as below. Albuterol refilled. F/U in 1-2 weeks. Has f/u with Dr. Beryle Flock on 11/27/19. - fluticasone furoate-vilanterol (BREO ELLIPTA) 200-25 MCG/INH AEPB; Inhale 1 puff into the lungs daily.  Dispense: 60 each; Refill: 0 - albuterol (VENTOLIN HFA) 108 (90 Base) MCG/ACT inhaler; TAKE  2 PUFFS BY MOUTH EVERY 6 HOURS AS NEEDED FOR WHEEZE OR SHORTNESS OF BREATH  Dispense: 18 g; Refill: 1  2. Chest tightness With associated SOB and occurring with activity. Exam limited due to virtual appt. Concerning since having with activity. Will refer to cardiology as below for further evaluation. Patient request Dr. Kirke Corin. It is possible these symptoms can all be associated with situational anxiety and grief. Her husband just passed in August and symptoms have been progressing since then. Need to r/o Takotsubo cardiomyopathy due to acuity with grief. Consult appreciated.  - Ambulatory referral to Cardiology  3. DOE (dyspnea on exertion) See above medical treatment plan. - Ambulatory referral to Cardiology  4. SOB (shortness of breath) See above medical treatment plan. - Ambulatory referral to Cardiology  5. Primary insomnia Worsening since the death of her husband. Has tried trazodone. Having issues with sleep maintenance, not falling asleep. Will see if Belsomra can be approved for sleep maintenance.  - Suvorexant (  BELSOMRA) 10 MG TABS; Take 10 mg by mouth at bedtime as needed.  Dispense: 30 tablet; Refill: 0   No follow-ups on file.     I discussed the assessment and treatment plan with the patient. The patient was provided an opportunity to ask questions and all were answered. The patient agreed with the plan and demonstrated an understanding of the instructions.   The patient was advised to call back or seek an in-person evaluation if the symptoms worsen or if the condition fails to improve as anticipated.  I provided 16 minutes of face-to-face time during this encounter via MyChart Video enabled encounter.  Delmer Islam, PA-C, have reviewed all documentation for this visit. The documentation on 11/15/19 for the exam, diagnosis, procedures, and orders are all accurate and complete.  Reine Just [facility] [phone]  (phone) [phone] (fax)  [facility]

## 2019-11-14 NOTE — Telephone Encounter (Signed)
FYI....  Pt has a virtual visit with you at 3:40  Thanks,   -Vernona Rieger

## 2019-11-14 NOTE — Patient Instructions (Signed)
Suvorexant oral tablets °What is this medicine? °SUVOREXANT (su-vor-EX-ant) is used to treat insomnia. This medicine helps you to fall asleep and sleep through the night. °This medicine may be used for other purposes; ask your health care provider or pharmacist if you have questions. °COMMON BRAND NAME(S): Belsomra °What should I tell my health care provider before I take this medicine? °They need to know if you have any of these conditions: °· depression °· drink alcohol °· drug abuse or addiction °· feel sleepy or have fallen asleep suddenly during the day °· history of a sudden onset of muscle weakness (cataplexy) °· liver disease °· lung or breathing disease, like asthma or emphysema °· sleep apnea °· suicidal thoughts, plans, or attempt; a previous suicide attempt by you or a family member °· an unusual or allergic reaction to suvorexant, other medicines, foods, dyes, or preservatives °· pregnant or trying to get pregnant °· breast-feeding °How should I use this medicine? °Take this medicine by mouth within 30 minutes of going to bed. Do not take it unless you are able to stay in bed a full night before you must be active again. Follow the directions on the prescription label. You may take this medicine with or without a food. However, this medicine may take longer to work if you take it with or right after meals. Do not take your medicine more often than directed. Do not stop taking this medicine on your own. Always follow your doctor or health care professional's advice. °A special MedGuide will be given to you by the pharmacist with each prescription and refill. Be sure to read this information carefully each time. °Talk to your pediatrician regarding the use of this medicine in children. Special care may be needed. °Overdosage: If you think you have taken too much of this medicine contact a poison control center or emergency room at once. °NOTE: This medicine is only for you. Do not share this medicine with  others. °What if I miss a dose? °This medicine should only be taken immediately before going to sleep. Do not take double or extra doses. °What may interact with this medicine? °· alcohol °· antihistamines for allergy, cough, or cold °· aprepitant °· boceprevir °· certain antibiotics like ciprofloxacin, clarithromycin, erythromycin, telithromycin °· certain antivirals for HIV or AIDS °· certain medicines for anxiety or sleep °· certain medicines for depression like amitriptyline, fluoxetine, nefazodone, sertraline °· certain medicines for fungal infections like ketoconazole, posaconazole, fluconazole, itraconazole °· certain medicines for seizures like carbamazepine, phenobarbital, primidone, phenytoin °· conivaptan °· digoxin °· diltiazem °· general anesthetics like halothane, isoflurane, methoxyflurane, propofol °· grapefruit juice °· imatinib °· medicines that relax muscles for surgery °· narcotic medicines for pain °· phenothiazines like chlorpromazine, mesoridazine, prochlorperazine, thioridazine °· rifampin °· verapamil °This list may not describe all possible interactions. Give your health care provider a list of all the medicines, herbs, non-prescription drugs, or dietary supplements you use. Also tell them if you smoke, drink alcohol, or use illegal drugs. Some items may interact with your medicine. °What should I watch for while using this medicine? °Visit your health care professional for regular checks on your progress. Tell your health care professional if your symptoms do not start to get better or if they get worse. Avoid caffeine-containing drinks in the evening hours. °After taking this medicine, you may get up out of bed and do an activity that you do not know you are doing. The next morning, you may have no memory of this.   Activities include driving a car ("sleep-driving"), making and eating food, talking on the phone, sexual activity, and sleep-walking. Serious injuries have occurred. Call your  doctor right away if you find out you have done any of these activities. Do not take this medicine if you have used alcohol that evening. Do not take it if you have taken another medicine for sleep. °Do not take this medicine unless you are able to stay in bed for a full night (7 to 8 hours) and do not drive or perform other activities requiring full alertness within 8 hours of a dose. Do not drive, use machinery, or do anything that needs mental alertness the day after you take the 20 mg dose of this medicine. The use of lower doses (10 mg) may also cause driving impairment the next day. You may have a decrease in mental alertness the day after use, even if you feel that you are fully awake. Tell your doctor if you will need to perform activities requiring full alertness, such as driving, the next day. Do not stand or sit up quickly after taking this medicine, especially if you are an older patient. This reduces the risk of dizzy or fainting spells. °If you or your family notice any changes in your behavior, such as new or worsening depression, thoughts of harming yourself, anxiety, other unusual or disturbing thoughts, or memory loss, call your health care professional right away. °After you stop taking this medicine, you may have trouble falling asleep. This is called rebound insomnia. This problem usually goes away on its own after 1 or 2 nights. °What side effects may I notice from receiving this medicine? °Side effects that you should report to your doctor or health care professional as soon as possible: °· allergic reactions like skin rash, itching or hives, swelling of the face, lips, or tongue °· hallucinations °· periods of leg weakness lasting from seconds to a few minutes °· suicidal thoughts, mood changes °· unable to move or speak for several minutes while going to sleep or waking up °· unusual activities while not fully awake like driving, eating, making phone calls, or sexual activity °Side effects  that usually do not require medical attention (report these to your doctor or health care professional if they continue or are bothersome): °· daytime drowsiness °· headache °· nightmares or abnormal dreams °· tiredness °This list may not describe all possible side effects. Call your doctor for medical advice about side effects. You may report side effects to FDA at 1-800-FDA-1088. °Where should I keep my medicine? °Keep out of the reach of children. This medicine can be abused. Keep your medicine in a safe place to protect it from theft. Do not share this medicine with anyone. Selling or giving away this medicine is dangerous and against the law. °Store at room temperature between 15 and 30 degrees C (59 and 86 degrees F). Throw away any unused medicine after the expiration date. °NOTE: This sheet is a summary. It may not cover all possible information. If you have questions about this medicine, talk to your doctor, pharmacist, or health care provider. °© 2020 [employer]vier/Gold Standard (2018-01-27 16:37:12) ° °

## 2019-11-15 ENCOUNTER — Encounter: Payer: Self-pay | Admitting: Physician Assistant

## 2019-11-22 ENCOUNTER — Other Ambulatory Visit: Payer: Self-pay

## 2019-11-22 ENCOUNTER — Encounter: Payer: Self-pay | Admitting: Cardiovascular Disease

## 2019-11-22 ENCOUNTER — Ambulatory Visit (INDEPENDENT_AMBULATORY_CARE_PROVIDER_SITE_OTHER): Payer: Medicare Other | Admitting: Cardiovascular Disease

## 2019-11-22 VITALS — BP 136/68 | HR 64 | Ht 65.0 in | Wt 202.2 lb

## 2019-11-22 DIAGNOSIS — I10 Essential (primary) hypertension: Secondary | ICD-10-CM

## 2019-11-22 DIAGNOSIS — R072 Precordial pain: Secondary | ICD-10-CM | POA: Diagnosis not present

## 2019-11-22 DIAGNOSIS — E785 Hyperlipidemia, unspecified: Secondary | ICD-10-CM

## 2019-11-22 DIAGNOSIS — R0602 Shortness of breath: Secondary | ICD-10-CM

## 2019-11-22 MED ORDER — METOPROLOL TARTRATE 100 MG PO TABS: ORAL_TABLET | ORAL | 0 refills | Status: DC

## 2019-11-22 NOTE — Progress Notes (Signed)
Cardiology Office Note   Date:  11/22/2019   ID:  Whitney Stuart, DOB [birth year], MRN [id number]  PCP:  Erasmo Downer, MD  Cardiologist:   Lorine Bears, MD   Chief Complaint  Patient presents with  . New Patient (Initial Visit)    Ref by Dr. Shirlee Latch for chest pain and shortness of breath on exertion. Meds reviewed by the pt. verbally. Pt. c/o rapid heart beats, shortness of breath when walking a short distance and occas. has chest pressure.       History of Present Illness: Whitney Stuart is a 69 y.o. female who was referred by Shirlee Latch for evaluation of chest pain and shortness of breath.  The patient has no prior cardiac history.  She has known history of essential hypertension, hyperlipidemia and secondhand smoking.  She is a lifelong non-smoker but has been exposed to secondhand smoking from her husband who smoked for many years.  He died in 10/06/22 from COPD and the patient had hard time since then with significant stress and anxiety.  Shortly after he died, she started having severe substernal chest tightness.  Chest tightness almost resolved but then she noticed significant exertional dyspnea which is currently happening with minimal exertion.  No orthopnea, PND or weight gain.  She has trace bilateral leg edema.  She reports no previous cardiac evaluation.  She does not exercise on a regular basis.    Past Medical History:  Diagnosis Date  . Arthritis   . Asthma   . Asthma   . GERD (gastroesophageal reflux disease)   . History of cervical cancer   . Hyperlipidemia   . Hypertension     Past Surgical History:  Procedure Laterality Date  . ABDOMINAL HYSTERECTOMY  1999   total  . HAND SURGERY Bilateral 2003   carpal tunnel  . LIPOMA EXCISION       Current Outpatient Medications  Medication Sig Dispense Refill  . albuterol (VENTOLIN HFA) 108 (90 Base) MCG/ACT inhaler TAKE 2 PUFFS BY MOUTH EVERY 6 HOURS AS NEEDED FOR WHEEZE OR SHORTNESS  OF BREATH 18 g 1  . amLODipine (NORVASC) 10 MG tablet Take 1 tablet (10 mg total) by mouth daily. 90 tablet 3  . aspirin 81 MG tablet Take 81 mg by mouth daily.    Marland Kitchen atorvastatin (LIPITOR) 10 MG tablet Take 1 tablet (10 mg total) by mouth daily. 90 tablet 3  . Calcium Carb-Cholecalciferol (CALCIUM 600+D3 PO) Take by mouth.    . clonazePAM (KLONOPIN) 0.5 MG tablet Take 0.5-1 tablets (0.25-0.5 mg total) by mouth 2 (two) times daily as needed for anxiety. 30 tablet 0  . cyclobenzaprine (FLEXERIL) 5 MG tablet TAKE 1 TABLET (5 MG TOTAL) BY MOUTH AT BEDTIME AS NEEDED FOR MUSCLE SPASMS. 30 tablet 0  . fluticasone furoate-vilanterol (BREO ELLIPTA) 200-25 MCG/INH AEPB Inhale 1 puff into the lungs daily. 60 each 0  . lisinopril (ZESTRIL) 10 MG tablet Take 1 tablet (10 mg total) by mouth daily. 90 tablet 3  . loratadine (CLARITIN) 10 MG tablet Take 10 mg by mouth daily.    . ondansetron (ZOFRAN) 4 MG tablet Take 1 tablet (4 mg total) by mouth every 8 (eight) hours as needed for nausea or vomiting. 20 tablet 0  . sertraline (ZOLOFT) 50 MG tablet Take 1 tablet (50 mg total) by mouth every other day. 45 tablet 2  . Suvorexant (BELSOMRA) 10 MG TABS Take 10 mg by mouth at bedtime as needed. 30 tablet  0   No current facility-administered medications for this visit.    Allergies:   Patient has no known allergies.    Social History:  The patient  reports that she quit smoking about 22 years ago. She has a 3.00 pack-year smoking history. She has never used smokeless tobacco. She reports that she does not drink alcohol and does not use drugs.   Family History:  The patient's family history includes Cancer in her sister; Congestive Heart Failure in her mother; Heart disease in her brother and mother; Heart murmur in her sister; Hypertension in her brother, brother, brother, brother, brother, brother, brother, brother, father, mother, sister, sister, and sister; Kidney disease in her father; Lung cancer in her  brother; Mental illness in her maternal aunt; Sleep apnea in her brother; Stroke in her mother.    ROS:  Please see the history of present illness.   Otherwise, review of systems are positive for none.   All other systems are reviewed and negative.    PHYSICAL EXAM: VS:  BP 136/68 (BP Location: Right Arm, Patient Position: Sitting, Cuff Size: Normal)   Pulse 64   Ht 5\' 5"  (1.651 m)   Wt 202 lb 4 oz (91.7 kg)   SpO2 98%   BMI 33.66 kg/m  , BMI Body mass index is 33.66 kg/m. GEN: Well nourished, well developed, in no acute distress  HEENT: normal  Neck: no JVD, carotid bruits, or masses Cardiac: RRR; no  rubs, or gallops.  1 out of 6 systolic murmur in the aortic area.  Trace bilateral leg edema. Respiratory:  clear to auscultation bilaterally, normal work of breathing GI: soft, nontender, nondistended, + BS MS: no deformity or atrophy  Skin: warm and dry, no rash Neuro:  Strength and sensation are intact Psych: euthymic mood, full affect   EKG:  EKG is ordered today. The ekg ordered today demonstrates normal sinus rhythm with no significant ST or T wave changes.   Recent Labs: 10/25/2019: ALT 12; BUN 17; Creatinine, Ser 0.98; Potassium 4.7; Sodium 142    Lipid Panel    Component Value Date/Time   CHOL 156 10/25/2019 1123   TRIG 79 10/25/2019 1123   HDL 53 10/25/2019 1123   CHOLHDL 2.9 10/25/2019 1123   LDLCALC 88 10/25/2019 1123      Wt Readings from Last 3 Encounters:  11/22/19 202 lb 4 oz (91.7 kg)  10/25/19 206 lb (93.4 kg)  06/01/19 210 lb (95.3 kg)       PAD Screen 11/22/2019  Previous PAD dx? No  Previous surgical procedure? No  Pain with walking? No  Feet/toe relief with dangling? No  Painful, non-healing ulcers? No  Extremities discolored? No      ASSESSMENT AND PLAN:  1.  Exertional dyspnea and substernal chest tightness: Her symptoms are highly concerning for cardiac etiology although stress and anxiety might also be contributing after the  death of her husband in October 03, 2022.  She has multiple risk factors for coronary artery disease with no previous cardiac evaluation.  Fortunately, her baseline EKG is normal. I recommend evaluation with an echocardiogram to evaluate her LV systolic/diastolic function and pulmonary pressure. Given her risk factors for coronary artery disease she requires further ischemic cardiac evaluation and I think the best test is CTA of the coronary arteries with FFR if needed.  2.  Essential hypertension: Blood pressure is reasonably controlled on current medications.  3.  Hyperlipidemia: Currently on small dose atorvastatin 10 mg daily.  If cardiac CTA  shows significant atherosclerosis, will have to be more aggressive with lipid management.  4.  Prolonged history of secondhand smoking: If cardiac work-up is unremarkable, consider pulmonary evaluation.    Disposition:   FU with me in 1 month  Signed,  Lorine Bears, MD  11/22/2019 11:29 AM    [facility]

## 2019-11-22 NOTE — Patient Instructions (Signed)
Medication Instructions:  Your physician recommends that you continue on your current medications as directed. Please refer to the Current Medication list given to you today.  A one time dose of Metoprolol 100 mg tab 2 hours prior to Cardiac CT An Rx has been sent to your pharmacy.  *If you need a refill on your cardiac medications before your next appointment, please call your pharmacy*   Lab Work: None ordered If you have labs (blood work) drawn today and your tests are completely normal, you will receive your results only by: Marland Kitchen MyChart Message (if you have MyChart) OR . A paper copy in the mail If you have any lab test that is abnormal or we need to change your treatment, we will call you to review the results.   Testing/Procedures: Your physician has requested that you have an echocardiogram. Echocardiography is a painless test that uses sound waves to create images of your heart. It provides your doctor with information about the size and shape of your heart and how well your heart's chambers and valves are working. This procedure takes approximately one hour. There are no restrictions for this procedure.  Your physician has requested that you have cardiac CT. Cardiac computed tomography (CT) is a painless test that uses an x-ray machine to take clear, detailed pictures of your heart. For further information please visit [email or web address].[email or web address]. Please follow instruction sheet as given.      Follow-Up: At [facility], you and your health needs are our priority.  As part of our continuing mission to provide you with exceptional heart care, we have created designated Provider Care Teams.  These Care Teams include your primary Cardiologist (physician) and Advanced Practice Providers (APPs -  Physician Assistants and Nurse Practitioners) who all work together to provide you with the care you need, when you need it.  We recommend signing up for the patient portal called "MyChart".  Sign  up information is provided on this After Visit Summary.  MyChart is used to connect with patients for Virtual Visits (Telemedicine).  Patients are able to view lab/test results, encounter notes, upcoming appointments, etc.  Non-urgent messages can be sent to your provider as well.   To learn more about what you can do with MyChart, go to [email or web address].    Your next appointment:   4 week(s)  The format for your next appointment:   In Person  Provider:   You may see Dr. Fletcher Anon or one of the following Advanced Practice Providers on your designated Care Team:    Murray Hodgkins, NP  Christell Faith, PA-C  Marrianne Mood, PA-C  Cadence Kathlen Mody, [place]    Other Instructions Your cardiac CT will be scheduled at one of the below locations:   [facility] [address] [place], [place] [address] [phone]  [place] [address] [place], [place] [address] [phone]  If scheduled at [facility], please arrive at the [facility] main entrance of [facility] 30 minutes prior to test start time. Proceed to the [facility] Radiology Department (first floor) to check-in and test prep.  If scheduled at [facility], please arrive 15 mins early for check-in and test prep.  Please follow these instructions carefully (unless otherwise directed):    On the Night Before the Test: . Be sure to Drink plenty of water. . Do not consume any caffeinated/decaffeinated beverages or chocolate 12 hours prior to your test. .  Do not take any antihistamines 12 hours prior to your test.  On the Day of the Test: . Drink plenty of water. Do not drink any water within one hour of the test. . Do not eat any food 4 hours prior to the test. . You may take your regular medications prior to the test.  . Take metoprolol (Lopressor) two hours prior to test. . FEMALES- please wear  underwire-free bra if available       After the Test: . Drink plenty of water. . After receiving IV contrast, you may experience a mild flushed feeling. This is normal. . On occasion, you may experience a mild rash up to 24 hours after the test. This is not dangerous. If this occurs, you can take Benadryl 25 mg and increase your fluid intake. . If you experience trouble breathing, this can be serious. If it is severe call 911 IMMEDIATELY. If it is mild, please call our office.  Once we have confirmed authorization from your insurance company, we will call you to set up a date and time for your test. Based on how quickly your insurance processes prior authorizations requests, please allow up to 4 weeks to be contacted for scheduling your Cardiac CT appointment. Be advised that routine Cardiac CT appointments could be scheduled as many as 8 weeks after your provider has ordered it.  For non-scheduling related questions, please contact the cardiac imaging nurse navigator should you have any questions/concerns: Marchia Bond, Cardiac Imaging Nurse Navigator Burley Saver, Interim Cardiac Imaging Nurse [place] [facility] Direct Office Dial: [phone]   For scheduling needs, including cancellations and rescheduling, please call Vivien Rota at [phone], option 3.

## 2019-11-23 ENCOUNTER — Telehealth: Payer: Self-pay

## 2019-11-23 DIAGNOSIS — N1831 Chronic kidney disease, stage 3a: Secondary | ICD-10-CM

## 2019-11-23 NOTE — Telephone Encounter (Signed)
-----   Message from Whitney Stuart sent at 11/23/2019 11:49 AM EDT ----- Regarding: ct heart   Scheduled 11/10 @ 11am [facility].  She will need labs  [name]

## 2019-11-23 NOTE — Telephone Encounter (Signed)
Lab order placed for bmet. Spoke with the patient. She will come to the [facility] medical mall next week to have the lab drawn. Adv the patient of the lab hours at that there is no appt needed.

## 2019-11-27 ENCOUNTER — Telehealth (INDEPENDENT_AMBULATORY_CARE_PROVIDER_SITE_OTHER): Payer: Medicare Other | Admitting: Family Medicine

## 2019-11-27 VITALS — BP 134/68

## 2019-11-27 DIAGNOSIS — F4321 Adjustment disorder with depressed mood: Secondary | ICD-10-CM | POA: Diagnosis not present

## 2019-11-27 DIAGNOSIS — F5101 Primary insomnia: Secondary | ICD-10-CM | POA: Diagnosis not present

## 2019-11-27 DIAGNOSIS — R0600 Dyspnea, unspecified: Secondary | ICD-10-CM | POA: Diagnosis not present

## 2019-11-27 DIAGNOSIS — R0609 Other forms of dyspnea: Secondary | ICD-10-CM | POA: Insufficient documentation

## 2019-11-27 DIAGNOSIS — F5104 Psychophysiologic insomnia: Secondary | ICD-10-CM

## 2019-11-27 DIAGNOSIS — J4520 Mild intermittent asthma, uncomplicated: Secondary | ICD-10-CM

## 2019-11-27 DIAGNOSIS — J4530 Mild persistent asthma, uncomplicated: Secondary | ICD-10-CM

## 2019-11-27 MED ORDER — FLUTICASONE FUROATE-VILANTEROL 200-25 MCG/INH IN AEPB: 1.0000 | INHALATION_SPRAY | Freq: Every day | RESPIRATORY_TRACT | 3 refills | Status: DC

## 2019-11-27 MED ORDER — BELSOMRA 10 MG PO TABS: 10.0000 mg | ORAL_TABLET | Freq: Every evening | ORAL | 5 refills | Status: DC | PRN

## 2019-11-27 NOTE — Progress Notes (Signed)
MyChart Video Visit    Virtual Visit via Video Note   This visit type was conducted due to national recommendations for restrictions regarding the COVID-19 Pandemic (e.g. social distancing) in an effort to limit this patient's exposure and mitigate transmission in our community. This patient is at least at moderate risk for complications without adequate follow up. This format is felt to be most appropriate for this patient at this time. Physical exam was limited by quality of the video and audio technology used for the visit.    Patient location: home Provider location: [facility] Persons involved in the visit: patient, provider  I discussed the limitations of evaluation and management by telemedicine and the availability of in person appointments. The patient expressed understanding and agreed to proceed.  Patient: Whitney Stuart   DOB: 1950/12/15   69 y.o. Female  MRN: [id number] Visit Date: 11/27/2019  Today's healthcare provider: Shirlee Latch, MD   Chief Complaint  Patient presents with  . Follow-up   Subjective    HPI   Started Breo on 11/14/19 and SOB at rest has improved. She still has DOE.  She is seeing Cardiology and has upcoming Cardiac CT and Echocardiogram this month.  She was switched from trazodone to Belsomra on 10/20.  That has helped her with sleep onset and sleep maintenance.  Mood is stable - "as good as can be expected."  Taking zoloft regularly with good compliance  Klonopin - none in last 2 weeks.  Tries to take only very sparingly.    Social History   Tobacco Use  . Smoking status: Former Smoker    Packs/day: 0.50    Years: 6.00    Pack years: 3.00    Quit date: 01/24/1997    Years since quitting: 22.8  . Smokeless tobacco: Never Used  Vaping Use  . Vaping Use: Never used  Substance Use Topics  . Alcohol use: No  . Drug use: No      Medications: Outpatient Medications Prior to Visit  Medication Sig  .  albuterol (VENTOLIN HFA) 108 (90 Base) MCG/ACT inhaler TAKE 2 PUFFS BY MOUTH EVERY 6 HOURS AS NEEDED FOR WHEEZE OR SHORTNESS OF BREATH  . amLODipine (NORVASC) 10 MG tablet Take 1 tablet (10 mg total) by mouth daily.  Marland Kitchen aspirin 81 MG tablet Take 81 mg by mouth daily.  Marland Kitchen atorvastatin (LIPITOR) 10 MG tablet Take 1 tablet (10 mg total) by mouth daily.  . Calcium Carb-Cholecalciferol (CALCIUM 600+D3 PO) Take by mouth.  . clonazePAM (KLONOPIN) 0.5 MG tablet Take 0.5-1 tablets (0.25-0.5 mg total) by mouth 2 (two) times daily as needed for anxiety.  . cyclobenzaprine (FLEXERIL) 5 MG tablet TAKE 1 TABLET (5 MG TOTAL) BY MOUTH AT BEDTIME AS NEEDED FOR MUSCLE SPASMS.  Marland Kitchen lisinopril (ZESTRIL) 10 MG tablet Take 1 tablet (10 mg total) by mouth daily.  Marland Kitchen loratadine (CLARITIN) 10 MG tablet Take 10 mg by mouth daily.  . metoprolol tartrate (LOPRESSOR) 100 MG tablet Take 1 tablet (100mg ) 2 hours prior to cardiac CT  . ondansetron (ZOFRAN) 4 MG tablet Take 1 tablet (4 mg total) by mouth every 8 (eight) hours as needed for nausea or vomiting.  . sertraline (ZOLOFT) 50 MG tablet Take 1 tablet (50 mg total) by mouth every other day.  . [DISCONTINUED] fluticasone furoate-vilanterol (BREO ELLIPTA) 200-25 MCG/INH AEPB Inhale 1 puff into the lungs daily.  . [DISCONTINUED] Suvorexant (BELSOMRA) 10 MG TABS Take 10 mg by mouth at bedtime as  needed.   No facility-administered medications prior to visit.    Review of Systems  Constitutional: Negative.   HENT: Negative.   Respiratory: Positive for chest tightness and shortness of breath. Negative for cough.   Cardiovascular: Positive for chest pain.  Genitourinary: Negative.   Neurological: Negative.   Psychiatric/Behavioral: Negative.       Objective    BP 134/68    Physical Exam  Gen: WDWN, NAD Resp: Normal effort, no respiratory distress Psych: Normal mood and affect, no SI   Assessment & Plan     Problem List Items Addressed This Visit       Respiratory   Asthma, exogenous    Controlled improved since starting Breo - will continue at current dose Continue albuterol prn      Relevant Medications   fluticasone furoate-vilanterol (BREO ELLIPTA) 200-25 MCG/INH AEPB     Other   Insomnia    Longstanding and improving Failed trazodone Doing well on Belsomra, which we will continue at current dose Discussed avoiding using Klonopin with Belsomra      Relevant Medications   Suvorexant (BELSOMRA) 10 MG TABS   Complicated grief - Primary    Due to death of her husband In acceptance phase of grief Not interested in therapy Tolerating Zoloft well - will continue at current dose considre tapering and d/c in 6 months or so Taking Klonopin very sparingly for acute anxiety Discussed the risk of Klonopin with Belsomra Contracted for safety-no SI/HI At follow-up, repeat PHQ-9 and GAD-7      DOE (dyspnea on exertion)    Ongoing with no recent worsening No improvement in this after starting Breo Undergoing cardiac work-up with cardiology currently Upcoming echo and cardiac CT          Return for as scheduled.     I discussed the assessment and treatment plan with the patient. The patient was provided an opportunity to ask questions and all were answered. The patient agreed with the plan and demonstrated an understanding of the instructions.   The patient was advised to call back or seek an in-person evaluation if the symptoms worsen or if the condition fails to improve as anticipated.  I, Shirlee Latch, MD, have reviewed all documentation for this visit. The documentation on 11/28/19 for the exam, diagnosis, procedures, and orders are all accurate and complete.   Megham Dwyer, Marzella Schlein, MD, MPH [facility]

## 2019-11-28 NOTE — Assessment & Plan Note (Signed)
Controlled improved since starting Breo - will continue at current dose Continue albuterol prn

## 2019-11-28 NOTE — Assessment & Plan Note (Signed)
Due to death of her husband In acceptance phase of grief Not interested in therapy Tolerating Zoloft well - will continue at current dose considre tapering and d/c in 6 months or so Taking Klonopin very sparingly for acute anxiety Discussed the risk of Klonopin with Belsomra Contracted for safety-no SI/HI At follow-up, repeat PHQ-9 and GAD-7

## 2019-11-28 NOTE — Assessment & Plan Note (Signed)
Ongoing with no recent worsening No improvement in this after starting Breo Undergoing cardiac work-up with cardiology currently Upcoming echo and cardiac CT

## 2019-11-28 NOTE — Assessment & Plan Note (Signed)
Longstanding and improving Failed trazodone Doing well on Belsomra, which we will continue at current dose Discussed avoiding using Klonopin with Belsomra

## 2019-11-29 ENCOUNTER — Other Ambulatory Visit
Admission: RE | Admit: 2019-11-29 | Discharge: 2019-11-29 | Disposition: A | Discharge status: Home / Self Care | Payer: Medicare Other | Attending: Cardiovascular Disease | Admitting: Cardiovascular Disease

## 2019-11-29 DIAGNOSIS — N1831 Chronic kidney disease, stage 3a: Secondary | ICD-10-CM

## 2019-11-29 LAB — BASIC METABOLIC PANEL
Anion gap: 11 (ref 5–15)
BUN: 23 mg/dL (ref 8–23)
CO2: 25 mmol/L (ref 22–32)
Calcium: 9.6 mg/dL (ref 8.9–10.3)
Chloride: 101 mmol/L (ref 98–111)
Creatinine, Ser: 1.12 mg/dL — ABNORMAL HIGH (ref 0.44–1.00)
GFR, Estimated: 54 mL/min — ABNORMAL LOW (ref >60)
Glucose, Bld: 101 mg/dL — ABNORMAL HIGH (ref 70–99)
Potassium: 4.4 mmol/L (ref 3.5–5.1)
Sodium: 137 mmol/L (ref 135–145)

## 2019-12-04 ENCOUNTER — Ambulatory Visit: Payer: Medicare HMO | Admitting: Cardiovascular Disease

## 2019-12-04 ENCOUNTER — Telehealth (HOSPITAL_COMMUNITY): Payer: Self-pay | Admitting: *Deleted

## 2019-12-04 NOTE — Telephone Encounter (Signed)
Reaching out to patient to offer assistance regarding upcoming cardiac imaging study; pt verbalizes understanding of appt date/time, parking situation and where to check in, pre-test NPO status and medications ordered, and verified current allergies; name and call back number provided for further questions should they arise  [employer] [facility] [phone] office [phone] cell  Pt verbalized understanding that if she were to take her klonopin then she would need a driver.  Pt states daughter is going to be taking her to appointment.

## 2019-12-05 ENCOUNTER — Ambulatory Visit
Admission: RE | Admit: 2019-12-05 | Discharge: 2019-12-05 | Disposition: A | Discharge status: Home / Self Care | Payer: Medicare Other | Source: Ambulatory Visit | Attending: Cardiovascular Disease | Admitting: Cardiovascular Disease

## 2019-12-05 ENCOUNTER — Other Ambulatory Visit: Payer: Self-pay

## 2019-12-05 DIAGNOSIS — R072 Precordial pain: Secondary | ICD-10-CM | POA: Diagnosis not present

## 2019-12-05 MED ORDER — NITROGLYCERIN 0.4 MG SL SUBL
0.8000 mg | SUBLINGUAL_TABLET | Freq: Once | SUBLINGUAL | Status: AC
  Administered 2019-12-05: 0.8 mg via SUBLINGUAL

## 2019-12-05 MED ORDER — IOHEXOL 300 MG/ML  SOLN: 85.0000 mL | Freq: Once | INTRAMUSCULAR | Status: DC | PRN

## 2019-12-05 MED ORDER — IOHEXOL 350 MG/ML SOLN
85.0000 mL | Freq: Once | INTRAVENOUS | Status: AC | PRN
  Administered 2019-12-05: 85 mL via INTRAVENOUS

## 2019-12-05 NOTE — Progress Notes (Signed)
Pt tolerated exam without incident; pt denies lightheadedness or dizziness; pt given caffeinated coca-cola; pt ambulatory to lobby steady gait noted

## 2019-12-07 ENCOUNTER — Telehealth: Payer: Self-pay

## 2019-12-07 ENCOUNTER — Ambulatory Visit (INDEPENDENT_AMBULATORY_CARE_PROVIDER_SITE_OTHER): Payer: Medicare Other

## 2019-12-07 ENCOUNTER — Other Ambulatory Visit: Payer: Self-pay

## 2019-12-07 DIAGNOSIS — R0602 Shortness of breath: Secondary | ICD-10-CM

## 2019-12-07 NOTE — Telephone Encounter (Signed)
Called to give the patient Cardiac CT results. [email or web address]. 

## 2019-12-07 NOTE — Telephone Encounter (Signed)
-----   Message from Muhammad A Arida, MD sent at 12/07/2019  1:42 PM EST ----- °Inform patient that cardiac CTA showed normal coronary arteries and calcium score of 0 which is excellent.  There was mild aortic atherosclerosis which is not surprising given her risk factors. ° °Forwarding this to primary care physician as an FYI ° °

## 2019-12-08 LAB — ECHOCARDIOGRAM COMPLETE
AR max vel: 2.79 cm2
AV Area VTI: 2.74 cm2
AV Area mean vel: 2.54 cm2
AV Mean grad: 6 mmHg
AV Peak grad: 11.3 mmHg
Ao pk vel: 1.68 m/s
Area-P 1/2: 3.74 cm2
S' Lateral: 2.1 cm
Single Plane A4C EF: 72.2 %

## 2019-12-10 NOTE — Telephone Encounter (Signed)
-----   Message from Iran Ouch, MD sent at 12/07/2019  1:42 PM EST ----- Inform patient that cardiac CTA showed normal coronary arteries and calcium score of 0 which is excellent.  There was mild aortic atherosclerosis which is not surprising given her risk factors.  Forwarding this to primary care physician as an Lorain Childes

## 2019-12-10 NOTE — Telephone Encounter (Signed)
Spoke to pt. Notified of cardiac CTA results below. Pt verbalized understanding and has no questions at this time. Pt will follow up in our office 12/25/19 with Gillian Shields, PA.

## 2019-12-25 ENCOUNTER — Encounter: Payer: Self-pay | Admitting: Family

## 2019-12-25 ENCOUNTER — Other Ambulatory Visit: Payer: Self-pay

## 2019-12-25 ENCOUNTER — Ambulatory Visit (INDEPENDENT_AMBULATORY_CARE_PROVIDER_SITE_OTHER): Payer: Medicare Other | Admitting: Family

## 2019-12-25 VITALS — BP 140/62 | HR 64 | Ht 66.0 in | Wt 208.0 lb

## 2019-12-25 DIAGNOSIS — I340 Nonrheumatic mitral (valve) insufficiency: Secondary | ICD-10-CM

## 2019-12-25 DIAGNOSIS — R0600 Dyspnea, unspecified: Secondary | ICD-10-CM

## 2019-12-25 DIAGNOSIS — I272 Pulmonary hypertension, unspecified: Secondary | ICD-10-CM

## 2019-12-25 DIAGNOSIS — R072 Precordial pain: Secondary | ICD-10-CM

## 2019-12-25 DIAGNOSIS — R0609 Other forms of dyspnea: Secondary | ICD-10-CM

## 2019-12-25 DIAGNOSIS — I10 Essential (primary) hypertension: Secondary | ICD-10-CM | POA: Diagnosis not present

## 2019-12-25 NOTE — Patient Instructions (Addendum)
Medication Instructions:  No medication changes today.   *If you need a refill on your cardiac medications before your next appointment, please call your pharmacy*  Lab Work: No lab work today.   Testing/Procedures: Your CT scan showed no plaque in your heart arteries which is a great result!  There was mild plaque in your aorta which is very common. Your Aspirin and Atorvastatin will help prevent this from progressing.   We will plan to repeat an echocardiogram in 1 year for monitoring of your mild to moderately leaky mitral valve and pulmonary hypertension.   Follow-Up: At [facility], you and your health needs are our priority.  As part of our continuing mission to provide you with exceptional heart care, we have created designated Provider Care Teams.  These Care Teams include your primary Cardiologist (physician) and Advanced Practice Providers (APPs -  Physician Assistants and Nurse Practitioners) who all work together to provide you with the care you need, when you need it.  We recommend signing up for the patient portal called "MyChart".  Sign up information is provided on this After Visit Summary.  MyChart is used to connect with patients for Virtual Visits (Telemedicine).  Patients are able to view lab/test results, encounter notes, upcoming appointments, etc.  Non-urgent messages can be sent to your provider as well.   To learn more about what you can do with MyChart, go to [email or web address].    Your next appointment:   6 month(s)  The format for your next appointment:   In Person  Provider:   You may see Lorine Bears, MD or one of the following Advanced Practice Providers on your designated Care Team:    Nicolasa Ducking, NP  Eula Listen, PA-C  Marisue Ivan, PA-C  Cadence Vergas, [place]  Gillian Shields, NP  Other Instructions  Pursed Lip Breathing  Pursed lip breathing keeps your airways open longer when you exhale and empties more air from your  lungs. This makes more space for fresh air when you inhale. Pursed lip breathing can also slow down your breathing and help your body not have to work so hard to breathe. Over time, pursed lip breathing may help you be able to be more physically active and do more activities. How to perform pursed lip breathing Being short of breath can make you tense and anxious. Before you start this breathing exercise, take a minute to relax your shoulders and close your eyes. Then: 1. Start the exercise by closing your mouth. 2. Breathe in through your nose, taking a normal breath. You can do this at your normal rate of breathing. If you feel you are not getting enough air, breathe in while slowly counting to 2 or 3. 3. Pucker (purse) your lips as if you were going to whistle. 4. Gently tighten your abdomen muscles or press on your belly to help push the air out. 5. Breathe out slowly through your pursed lips. Take at least twice as long to breathe out as it takes you to breathe in. 6. Make sure that you breathe out all of the air, but do not force air out. 7. Repeat the exercise until your breathing improves. Ask your health care provider how often and how long to do this exercise. Follow these instructions at home:  Take over-the-counter and prescription medicines only as told by your health care provider.  Return to your normal activities as told by your health care provider. Ask your health care provider what activities are safe for  you.  Do not use any products that contain nicotine or tobacco, such as cigarettes and e-cigarettes. If you need help quitting, ask your health care provider.  Keep all follow-up visits as told by your health care provider. This is important. Contact a health care provider if:  Your shortness of breath gets worse.  You become less able to exercise or be active.  You develop a cough.  You develop a fever. Get help right away if:  You are struggling to breathe.  Your  shortness of breath prevents you from engaging in any activity. Summary  Pursed lip breathing is a breathing technique that helps to remove trapped air from your lungs. It helps you get more oxygen into your lungs and makes your body have to work less hard to breathe.  Pursed lip breathing can gradually make you more able to be physically active.  You can do pursed lip breathing on your own at home.  Ask your health care provider how often and how long you should do pursed lip breathing. This information is not intended to replace advice given to you by your health care provider. Make sure you discuss any questions you have with your health care provider. Document Revised: 12/24/2016 Document Reviewed: 12/04/2015 Elsevier Patient Education  2020 [employer].

## 2019-12-25 NOTE — Progress Notes (Signed)
Office Visit    Patient Name: Whitney Stuart Date of Encounter: 12/25/2019  Primary Care Provider:  Erasmo Downer, MD Primary Cardiologist:  Lorine Bears, MD Electrophysiologist:  None   Chief Complaint    Whitney Stuart is a 69 y.o. female with a hx of HTN, HLD, asthma, arthritis, GERD presents today for follow-up after echocardiogram and cardiac CTA  Past Medical History    Past Medical History:  Diagnosis Date  . Arthritis   . Asthma   . Asthma   . GERD (gastroesophageal reflux disease)   . History of cervical cancer   . Hyperlipidemia   . Hypertension    Past Surgical History:  Procedure Laterality Date  . ABDOMINAL HYSTERECTOMY  1999   total  . HAND SURGERY Bilateral 2003   carpal tunnel  . LIPOMA EXCISION      Allergies  No Known Allergies  History of Present Illness    Whitney Stuart is a 69 y.o. female with a hx of aortic atherosclerosis, HTN, HLD, asthma, arthritis, GERD  last seen 11/22/2019 by Dr. Kirke Corin.  She was seen in consult by Dr. Kirke Corin for evaluation for chest pain and shortness of breath.  Lifelong non-smoker but with exposure to secondhand smoking from her husband has been for many years.  He passed away in 2022/09/28 from COPD.  She noted that shortly after he passed she started having severe substernal chest tightness which resolved but then she noticed significant exertional dyspnea.  She was recommended for echocardiogram as well as cardiac CT. cardiac CT 12/06/2019 with coronary calcium score of 0 with noncardiac read showing aortic atherosclerosis.  Echo 12/07/2019 with LVEF 60 to 65%, no wall motion abnormalities, RV normal size and function, grade 1 diastolic dysfunction, moderately elevated PASP with RVSP 47 mmHg, LA moderately dilated, mild to moderate MR. Dr. Kirke Corin recommended repeat echo in 1 to 2 years to follow-up on pulmonary hypertension and mitral regurgitation.  She presents today for follow-up with her daughter.  She  reports no recurrent chest pain, pressure, tightness.  Tells me she has no shortness of breath at rest and notices occasional dyspnea on exertion.  Overall she feels this is improving as she was able to do a significant amount of yard work yesterday without difficulty.  We reviewed her echocardiogram and cardiac CTA in depth.  EKGs/Labs/Other Studies Reviewed:   The following studies were reviewed today: Echo 12/07/2019 1. Left ventricular ejection fraction, by estimation, is 60 to 65%. The  left ventricle has normal function. The left ventricle has no regional  wall motion abnormalities. Left ventricular diastolic parameters are  consistent with Grade I diastolic  dysfunction (impaired relaxation). The average left ventricular global  longitudinal strain is -20.0 %.   2. Right ventricular systolic function is normal. The right ventricular  size is normal. There is moderately elevated pulmonary artery systolic  pressure. The estimated right ventricular systolic pressure is 47.0 mmHg.   3. Left atrial size was moderately dilated.   4. The mitral valve is normal in structure. Mild to moderate mitral valve  regurgitation.   Cardiac CTA 12/05/2019   IMPRESSION: 1. Coronary calcium score of 0. Patient is low risk for coronary events   2. Normal coronary origin with right dominance.   3. No evidence of CAD.   4. CAD-RADS 0. Consider non-atherosclerotic causes of chest pain.  EKG: No EKG today  Recent Labs: 10/25/2019: ALT 12 11/29/2019: BUN 23; Creatinine, Ser 1.12; Potassium 4.4; Sodium 137  Recent Lipid Panel    Component Value Date/Time   CHOL 156 10/25/2019 1123   TRIG 79 10/25/2019 1123   HDL 53 10/25/2019 1123   CHOLHDL 2.9 10/25/2019 1123   LDLCALC 88 10/25/2019 1123   Home Medications   Current Meds  Medication Sig  . albuterol (VENTOLIN HFA) 108 (90 Base) MCG/ACT inhaler TAKE 2 PUFFS BY MOUTH EVERY 6 HOURS AS NEEDED FOR WHEEZE OR SHORTNESS OF BREATH  . amLODipine  (NORVASC) 10 MG tablet Take 1 tablet (10 mg total) by mouth daily.  Marland Kitchen aspirin 81 MG tablet Take 81 mg by mouth daily.  Marland Kitchen atorvastatin (LIPITOR) 10 MG tablet Take 1 tablet (10 mg total) by mouth daily.  . Calcium Carb-Cholecalciferol (CALCIUM 600+D3 PO) Take by mouth.  . clonazePAM (KLONOPIN) 0.5 MG tablet Take 0.5-1 tablets (0.25-0.5 mg total) by mouth 2 (two) times daily as needed for anxiety.  . cyclobenzaprine (FLEXERIL) 5 MG tablet TAKE 1 TABLET (5 MG TOTAL) BY MOUTH AT BEDTIME AS NEEDED FOR MUSCLE SPASMS.  . fluticasone furoate-vilanterol (BREO ELLIPTA) 200-25 MCG/INH AEPB Inhale 1 puff into the lungs daily.  Marland Kitchen lisinopril (ZESTRIL) 10 MG tablet Take 1 tablet (10 mg total) by mouth daily.  Marland Kitchen loratadine (CLARITIN) 10 MG tablet Take 10 mg by mouth daily.  . ondansetron (ZOFRAN) 4 MG tablet Take 1 tablet (4 mg total) by mouth every 8 (eight) hours as needed for nausea or vomiting.  . sertraline (ZOLOFT) 50 MG tablet Take 1 tablet (50 mg total) by mouth every other day.  . Suvorexant (BELSOMRA) 10 MG TABS Take 10 mg by mouth at bedtime as needed.     Review of Systems  All other systems reviewed and are otherwise negative except as noted above.  Physical Exam    VS:  BP 140/62 (BP Location: Left Arm, Patient Position: Sitting, Cuff Size: Normal)   Pulse 64   Ht 5\' 6"  (1.676 m)   Wt 208 lb (94.3 kg)   SpO2 98%   BMI 33.57 kg/m  , BMI Body mass index is 33.57 kg/m.  Wt Readings from Last 3 Encounters:  12/25/19 208 lb (94.3 kg)  11/22/19 202 lb 4 oz (91.7 kg)  10/25/19 206 lb (93.4 kg)    GEN: Well nourished, overweight, well developed, in no acute distress. HEENT: normal. Neck: Supple, no JVD, carotid bruits, or masses. Cardiac: RRR, no murmurs, rubs, or gallops. No clubbing, cyanosis, edema.  Radials/DP/PT 2+ and equal bilaterally.  Respiratory:  Respirations regular and unlabored, clear to auscultation bilaterally. GI: Soft, nontender, nondistended. MS: No deformity or  atrophy. Skin: Warm and dry, no rash. Neuro:  Strength and sensation are intact. Psych: Normal affect.  Assessment & Plan    1. Pulmonary hypertension - Echocardiogram 12/07/2019 with moderately elevated PASP with RV systolic pressure 47 mmHg.  We discussed the possibility of adding low-dose of Lasix daily.  She prefers to proceed with conservative therapy.  She attributes much of her shortness of breath to deconditioning and her weight.  She plans to start a walking regimen and will contact her office if her breathing does not improve.  Plan for repeat echocardiogram in 1 year for monitoring.  2. Mild to moderate mitral regurgitation by echo 11/2019 -plan for repeat echocardiogram in 1 year for monitoring.  Discussed the need for optimal BP control.  3. Chest pain-No recurrence.  Cardiac CTA 11/2019 with coronary calcium score of 0.  Previous chest discomfort likely stress related/GERD versus musculoskeletal.  No indication for  further evaluation at this time his symptoms have resolved.  4. Dyspnea on exertion - Denies shortness of breath at rest.  Reports her dyspnea on exertion is intermittent.  She was able to do yard work yesterday without difficulty.  Anticipate some of her dyspnea on exertion is related to deconditioning, encouraged slow return to regular cardiovascular exercise.  She tells me she also plans to lose weight and we discussed that this would likely improve her dyspnea on exertion.   5. HTN-reports home blood pressure readings well controlled.  Continue present antihypertensive regimen including lisinopril 10 mg daily, amlodipine 10 mg daily.  6. HLD- Continue atorvastatin.  In setting of aortic atherosclerosis would recommend LDL goal less than 70.  Continue to follow with primary care provider.  7. Aortic atherosclerosis-noted by cardiac CTA.  Continue aspirin, atorvastatin.  Continue heart healthy, low-sodium diet.  Encourage regular cardiovascular exercise.  Disposition:  Follow up in 6 month(s) with Dr. Kirke Corin or APP  Signed, Alver Sorrow, NP 12/25/2019, 2:35 PM [facility]

## 2020-01-23 ENCOUNTER — Other Ambulatory Visit: Payer: Self-pay | Admitting: Physician Assistant

## 2020-01-23 ENCOUNTER — Other Ambulatory Visit: Payer: Self-pay | Admitting: Family Medicine

## 2020-01-23 DIAGNOSIS — J4520 Mild intermittent asthma, uncomplicated: Secondary | ICD-10-CM

## 2020-01-23 NOTE — Telephone Encounter (Signed)
Requested medication (s) are due for refill today: no  Requested medication (s) are on the active medication list: yes  Last refill:  09/19/2019  Future visit scheduled: yes  Notes to clinic: this refill cannot be delegated   Requested Prescriptions  Pending Prescriptions Disp Refills   cyclobenzaprine (FLEXERIL) 5 MG tablet [Pharmacy Med Name: CYCLOBENZAPRINE 5 MG TABLET] 30 tablet 0    Sig: TAKE 1 TABLET (5 MG TOTAL) BY MOUTH AT BEDTIME AS NEEDED FOR MUSCLE SPASMS.      Not Delegated - Analgesics:  Muscle Relaxants Failed - 01/23/2020  5:05 PM      Failed - This refill cannot be delegated      Passed - Valid encounter within last 6 months    Recent Outpatient Visits           1 month ago Complicated grief   [facility] [place], Marzella Schlein, [place]   2 months ago Mild intermittent extrinsic asthma without complication   [facility] Joycelyn Man M, [place]   3 months ago Complicated grief   [facility] [place], Marzella Schlein, MD   4 months ago Complicated grief   [facility], Marzella Schlein, [place]   7 months ago Laceration of right upper extremity, initial encounter   [facility] [place], Marzella Schlein, MD       Future Appointments             In 3 months [place], Marzella Schlein, MD [facility]   In 5 months Kirke Corin, Chelsea Aus, MD [id number]

## 2020-01-24 ENCOUNTER — Other Ambulatory Visit: Payer: Self-pay | Admitting: Family Medicine

## 2020-01-24 DIAGNOSIS — J4520 Mild intermittent asthma, uncomplicated: Secondary | ICD-10-CM

## 2020-01-24 NOTE — Telephone Encounter (Signed)
Requested medication (s) are due for refill today:no  Requested medication (s) are on the active medication list: no  Last refill: yesterday #18 1 refill  Future visit scheduled:yes  Notes to clinic:  Pharmacy requesting RX  change to [employer].  Please review.    Requested Prescriptions  Pending Prescriptions Disp Refills   VENTOLIN HFA 108 (90 Base) MCG/ACT inhaler [Pharmacy Med Name: VENTOLIN HFA 90 MCG INHALER] 18 each 1    Sig: TAKE 2 PUFFS BY MOUTH EVERY 6 HOURS AS NEEDED FOR WHEEZE OR SHORTNESS OF BREATH      Pulmonology:  Beta Agonists Failed - 01/24/2020  8:21 AM      Failed - One inhaler should last at least one month. If the patient is requesting refills earlier, contact the patient to check for uncontrolled symptoms.      Passed - Valid encounter within last 12 months    Recent Outpatient Visits           1 month ago Complicated grief   [facility] [place], Marzella Schlein, MD   2 months ago Mild intermittent extrinsic asthma without complication   [facility] Joycelyn Man M, [place]   3 months ago Complicated grief   [facility] [place], Marzella Schlein, MD   4 months ago Complicated grief   [facility], Marzella Schlein, MD   7 months ago Laceration of right upper extremity, initial encounter   [facility] Bac[place]al[place], Marzella Schlein, MD       Future Appointments             In 3 months Bacigalupo, Marzella Schlein, MD [facility], [facility]   In 5 months Kirke Corin, Chelsea Aus, MD [facility], [facility]

## 2020-02-20 ENCOUNTER — Other Ambulatory Visit: Payer: Self-pay | Admitting: Physician Assistant

## 2020-02-20 NOTE — Telephone Encounter (Signed)
Requested medication (s) are due for refill today: yes  Requested medication (s) are on the active medication list: yes  Last refill:  01/24/20 #30  Future visit scheduled: yes  Notes to clinic:  Please review for refill. Refill not delegated per protocol    Requested Prescriptions  Pending Prescriptions Disp Refills   cyclobenzaprine (FLEXERIL) 5 MG tablet [Pharmacy Med Name: CYCLOBENZAPRINE 5 MG TABLET] 30 tablet 0    Sig: TAKE 1 TABLET BY MOUTH AT BEDTIME AS NEEDED FOR MUSCLE SPASMS.      Not Delegated - Analgesics:  Muscle Relaxants Failed - 02/20/2020 11:57 AM      Failed - This refill cannot be delegated      Passed - Valid encounter within last 6 months    Recent Outpatient Visits           2 months ago Complicated grief   [facility] [place], Marzella Schlein, MD   3 months ago Mild intermittent extrinsic asthma without complication   [facility] [place], Alessandra Bevels, [place]   3 months ago Complicated grief   [facility] [place], Marzella Schlein, MD   5 months ago Complicated grief   [facility] [place], Marzella Schlein, MD   8 months ago Laceration of right upper extremity, initial encounter   [facility] [place]al[place], Marzella Schlein, MD       Future Appointments             In 2 months Bacigalupo, Marzella Schlein, MD [facility], [facility]   In 4 months Kirke Corin, Chelsea Aus, MD [facility], [facility]

## 2020-03-11 ENCOUNTER — Other Ambulatory Visit: Payer: Self-pay | Admitting: Family Medicine

## 2020-03-11 ENCOUNTER — Other Ambulatory Visit: Payer: Self-pay | Admitting: Physician Assistant

## 2020-03-11 DIAGNOSIS — J4520 Mild intermittent asthma, uncomplicated: Secondary | ICD-10-CM

## 2020-03-11 NOTE — Telephone Encounter (Signed)
Requested medication (s) are due for refill today:   Provider to determine  Requested medication (s) are on the active medication list:   Yes  Future visit scheduled:   Yes   Last ordered: 02/21/2020 #30, 0 refills  Clinic note:  Returned because this is a non delegated refill per protocol.   Requested Prescriptions  Pending Prescriptions Disp Refills   cyclobenzaprine (FLEXERIL) 5 MG tablet [Pharmacy Med Name: CYCLOBENZAPRINE 5 MG TABLET] 30 tablet 0    Sig: TAKE 1 TABLET BY MOUTH AT BEDTIME AS NEEDED FOR MUSCLE SPASMS.      Not Delegated - Analgesics:  Muscle Relaxants Failed - 03/11/2020 12:51 PM      Failed - This refill cannot be delegated      Passed - Valid encounter within last 6 months    Recent Outpatient Visits           3 months ago Complicated grief   [facility] [place], Marzella Schlein, MD   3 months ago Mild intermittent extrinsic asthma without complication   [facility] Joycelyn Man M, [place]   4 months ago Complicated grief   [facility] [place], Marzella Schlein, MD   5 months ago Complicated grief   [facility] [place], Marzella Schlein, MD   9 months ago Laceration of right upper extremity, initial encounter   [facility] Bac[place]al[place], Marzella Schlein, MD       Future Appointments             In 2 months Bacigalupo, Marzella Schlein, MD [facility], [facility]   In 3 months Kirke Corin, Chelsea Aus, MD [facility], [facility]

## 2020-04-23 ENCOUNTER — Other Ambulatory Visit: Payer: Self-pay | Admitting: Family Medicine

## 2020-04-23 NOTE — Telephone Encounter (Signed)
Requested medication (s) are due for refill today: yes  Requested medication (s) are on the active medication list: yes  Last refill: 03/15/2020  Future visit scheduled: yes  Notes to clinic:  this refill cannot be delegated    Requested Prescriptions  Pending Prescriptions Disp Refills   cyclobenzaprine (FLEXERIL) 5 MG tablet [Pharmacy Med Name: CYCLOBENZAPRINE 5 MG TABLET] 30 tablet 0    Sig: TAKE 1 TABLET BY MOUTH AT BEDTIME AS NEEDED FOR MUSCLE SPASMS.      Not Delegated - Analgesics:  Muscle Relaxants Failed - 04/23/2020 12:08 PM      Failed - This refill cannot be delegated      Passed - Valid encounter within last 6 months    Recent Outpatient Visits           4 months ago Complicated grief   [facility] [place], Marzella Schlein, MD   5 months ago Mild intermittent extrinsic asthma without complication   [facility] Joycelyn Man M, [place]   6 months ago Complicated grief   [facility] [place], Marzella Schlein, MD   7 months ago Complicated grief   [facility], Marzella Schlein, MD   10 months ago Laceration of right upper extremity, initial encounter   [facility] [place], Marzella Schlein, MD       Future Appointments             In 3 weeks Bacigalupo, Marzella Schlein, MD [facility], [facility]   In 2 months Kirke Corin, Chelsea Aus, MD [facility], [facility]

## 2020-05-15 ENCOUNTER — Ambulatory Visit: Payer: Medicare HMO | Admitting: Family Medicine

## 2020-05-21 ENCOUNTER — Other Ambulatory Visit: Payer: Self-pay | Admitting: Family Medicine

## 2020-05-21 NOTE — Telephone Encounter (Signed)
Requested medication (s) are due for refill today: yes  Requested medication (s) are on the active medication list: yes   Last refill: 04/24/2020  Future visit scheduled: yes   Notes to clinic:  this refill cannot be delegated    Requested Prescriptions  Pending Prescriptions Disp Refills   cyclobenzaprine (FLEXERIL) 5 MG tablet [Pharmacy Med Name: CYCLOBENZAPRINE 5 MG TABLET] 30 tablet 0    Sig: TAKE 1 TABLET BY MOUTH AT BEDTIME AS NEEDED FOR MUSCLE SPASMS.      Not Delegated - Analgesics:  Muscle Relaxants Failed - 05/21/2020 11:39 AM      Failed - This refill cannot be delegated      Passed - Valid encounter within last 6 months    Recent Outpatient Visits           5 months ago Complicated grief   [facility] [place], Marzella Schlein, MD   6 months ago Mild intermittent extrinsic asthma without complication   [facility] Joycelyn Man M, [place]   6 months ago Complicated grief   [facility] [place], Marzella Schlein, MD   8 months ago Complicated grief   [facility], Marzella Schlein, MD   11 months ago Laceration of right upper extremity, initial encounter   [facility] [place], Marzella Schlein, MD       Future Appointments             In 1 month [place], Chelsea Aus, MD [facility], [facility]   In 1 month Bacigalupo, Marzella Schlein, MD [facility], [facility]

## 2020-06-10 ENCOUNTER — Other Ambulatory Visit: Payer: Self-pay | Admitting: Family Medicine

## 2020-06-10 DIAGNOSIS — I10 Essential (primary) hypertension: Secondary | ICD-10-CM

## 2020-07-01 ENCOUNTER — Ambulatory Visit: Payer: Medicare Other | Admitting: Cardiovascular Disease

## 2020-07-01 ENCOUNTER — Ambulatory Visit (INDEPENDENT_AMBULATORY_CARE_PROVIDER_SITE_OTHER): Payer: Medicare Other | Admitting: Nurse Practitioner

## 2020-07-01 ENCOUNTER — Other Ambulatory Visit: Payer: Self-pay

## 2020-07-01 ENCOUNTER — Encounter: Payer: Self-pay | Admitting: Nurse Practitioner

## 2020-07-01 VITALS — BP 120/70 | HR 67 | Ht 66.0 in | Wt 202.0 lb

## 2020-07-01 DIAGNOSIS — R0609 Other forms of dyspnea: Secondary | ICD-10-CM

## 2020-07-01 DIAGNOSIS — R0600 Dyspnea, unspecified: Secondary | ICD-10-CM | POA: Diagnosis not present

## 2020-07-01 DIAGNOSIS — E782 Mixed hyperlipidemia: Secondary | ICD-10-CM

## 2020-07-01 DIAGNOSIS — E785 Hyperlipidemia, unspecified: Secondary | ICD-10-CM | POA: Insufficient documentation

## 2020-07-01 DIAGNOSIS — I340 Nonrheumatic mitral (valve) insufficiency: Secondary | ICD-10-CM | POA: Insufficient documentation

## 2020-07-01 DIAGNOSIS — I10 Essential (primary) hypertension: Secondary | ICD-10-CM | POA: Diagnosis not present

## 2020-07-01 DIAGNOSIS — G4730 Sleep apnea, unspecified: Secondary | ICD-10-CM | POA: Insufficient documentation

## 2020-07-01 DIAGNOSIS — I2721 Secondary pulmonary arterial hypertension: Secondary | ICD-10-CM | POA: Insufficient documentation

## 2020-07-01 DIAGNOSIS — R079 Chest pain, unspecified: Secondary | ICD-10-CM | POA: Diagnosis not present

## 2020-07-01 DIAGNOSIS — I5189 Other ill-defined heart diseases: Secondary | ICD-10-CM | POA: Diagnosis not present

## 2020-07-01 NOTE — Patient Instructions (Addendum)
Medication Instructions:  No changes at this time.  *If you need a refill on your cardiac medications before your next appointment, please call your pharmacy*   Lab Work: None  If you have labs (blood work) drawn today and your tests are completely normal, you will receive your results only by: Marland Kitchen MyChart Message (if you have MyChart) OR . A paper copy in the mail If you have any lab test that is abnormal or we need to change your treatment, we will call you to review the results.   Testing/Procedures: WatchPAT?  Is a FDA cleared portable home sleep study test that uses a watch and 3 points of contact to monitor 7 different channels, including your heart rate, oxygen saturations, body position, snoring, and chest motion.  The study is easy to use from the comfort of your own home and accurately detect sleep apnea.  Before bed, you attach the chest sensor, attached the sleep apnea bracelet to your nondominant hand, and attach the finger probe.  After the study, the raw data is downloaded from the watch and scored for apnea events.   For more information: [email or web address]     Follow-Up: At [facility], you and your health needs are our priority.  As part of our continuing mission to provide you with exceptional heart care, we have created designated Provider Care Teams.  These Care Teams include your primary Cardiologist (physician) and Advanced Practice Providers (APPs -  Physician Assistants and Nurse Practitioners) who all work together to provide you with the care you need, when you need it.   Your next appointment:   6 month(s)  The format for your next appointment:   In Person  Provider:   Lorine Bears, MD or Nicolasa Ducking, NP

## 2020-07-01 NOTE — Progress Notes (Signed)
Office Visit    Patient Name: Whitney Stuart Date of Encounter: 07/01/2020  Primary Care Provider:  Erasmo Downer, MD Primary Cardiologist:  Lorine Bears, MD  Chief Complaint    70 year old female with a history of hypertension, hyperlipidemia, asthma, arthritis, GERD, prolonged secondhand smoke exposure, mitral regurgitation, chest pain, and dyspnea, who presents for follow-up related to dyspnea on exertion.  Past Medical History    Past Medical History:  Diagnosis Date  . Arthritis   . Asthma   . Asthma   . Chest pain    a. 11/2019 Cor CTA: Calcium score equals 0.  Normal coronary arteries.  . Diastolic dysfunction    a. 11/2019 Echo: EF 60-65%.  Grade 1 diastolic dysfunction.  No regional wall motion abnormalities.  RVSP 47 mmHg.  Moderately dilated left atrium.  Mild to moderate MR.  Marland Kitchen GERD (gastroesophageal reflux disease)   . History of cervical cancer   . Hyperlipidemia   . Hypertension   . Mitral regurgitation   . PAH (pulmonary artery hypertension) (HCC)    a. 11/2019 Echo: RVSP .  . Sleep-disordered breathing    Past Surgical History:  Procedure Laterality Date  . ABDOMINAL HYSTERECTOMY  1999   total  . HAND SURGERY Bilateral 2003   carpal tunnel  . LIPOMA EXCISION      Allergies  No Known Allergies  History of Present Illness    70 year old female with above past medical history including hypertension, hyperlipidemia, asthma, arthritis, GERD, prolonged secondhand smoke exposure, mitral regurgitation, chest pain, and dyspnea.  She was seen in cardiology clinic in October 2021 in the setting of chest pain and dyspnea.  Echocardiogram was performed and showed normal LV function, grade 1 diastolic dysfunction, pulmonary hypertension with an RVSP of 47 mmHg, and mild to moderate mitral regurgitation.  Coronary CT angiography was also performed showing a coronary calcium score of 0, normal coronary arteries, and aortic atherosclerosis.  Medical  therapy was recommended.  Since her last visit, she notes that she has been stable.  She continues report chronic dyspnea on exertion, stating she can only walk about 50 yards before having to slow her pace or rest.  This is unchanged over a few years.  She does not experience chest pain.  She was previously advised to begin exercise regimen however, she did not.  She does note lower extremity swelling, especially at the end of the day.  She has not had any significant weight change.  She cooks mostly from scratch and is very careful with salt intake.  She does snore and often feels unrested after sleep or fatigued throughout the day.  She denies palpitations, PND, orthopnea, dizziness, syncope, or early satiety.  Home Medications    Prior to Admission medications   Medication Sig Start Date End Date Taking? Authorizing Provider  albuterol (VENTOLIN HFA) 108 (90 Base) MCG/ACT inhaler TAKE 2 PUFFS BY MOUTH EVERY 6 HOURS AS NEEDED FOR WHEEZE OR SHORTNESS OF BREATH 03/11/20   Margaretann Loveless, PA-C  amLODipine (NORVASC) 10 MG tablet Take 1 tablet (10 mg total) by mouth daily. 05/15/19   Erasmo Downer, MD  aspirin 81 MG tablet Take 81 mg by mouth daily.    [provider]  atorvastatin (LIPITOR) 10 MG tablet Take 1 tablet (10 mg total) by mouth daily. 05/15/19   Erasmo Downer, MD  Calcium Carb-Cholecalciferol (CALCIUM 600+D3 PO) Take by mouth.    [provider]  clonazePAM (KLONOPIN) 0.5 MG tablet  Take 0.5-1 tablets (0.25-0.5 mg total) by mouth 2 (two) times daily as needed for anxiety. 09/18/19   Bacigalupo, Marzella Schlein, MD  cyclobenzaprine (FLEXERIL) 5 MG tablet TAKE 1 TABLET BY MOUTH AT BEDTIME AS NEEDED FOR MUSCLE SPASMS. 05/22/20   Erasmo Downer, MD  fluticasone furoate-vilanterol (BREO ELLIPTA) 200-25 MCG/INH AEPB Inhale 1 puff into the lungs daily. 11/27/19   Erasmo Downer, MD  lisinopril (ZESTRIL) 10 MG tablet TAKE 1 TABLET BY MOUTH EVERY DAY 06/10/20    Bacigalupo, Marzella Schlein, MD  loratadine (CLARITIN) 10 MG tablet Take 10 mg by mouth daily.    [provider]  ondansetron (ZOFRAN) 4 MG tablet Take 1 tablet (4 mg total) by mouth every 8 (eight) hours as needed for nausea or vomiting. 09/21/19   Beryle Flock, Marzella Schlein, MD  sertraline (ZOLOFT) 50 MG tablet Take 1 tablet (50 mg total) by mouth every other day. 10/25/19   Bacigalupo, Marzella Schlein, MD  Suvorexant (BELSOMRA) 10 MG TABS Take 10 mg by mouth at bedtime as needed. 11/27/19   Erasmo Downer, MD    Review of Systems    Chronic, stable dyspnea exertion.  Mild lower extremity edema at the end of the day.  She denies chest pain, palpitations, PND, orthopnea, dizziness, syncope, or early satiety.  All other systems reviewed and are otherwise negative except as noted above.  Physical Exam    VS:  BP 120/70 (BP Location: Left Arm, Patient Position: Sitting, Cuff Size: Large)   Pulse 67   Ht 5\' 6"  (1.676 m)   Wt 202 lb (91.6 kg)   SpO2 97%   BMI 32.60 kg/m  , BMI Body mass index is 32.6 kg/m.  STOP-BANG 6 GEN: Well nourished, well developed, in no acute distress. HEENT: normal. Neck: Supple, no JVD, carotid bruits, or masses. Cardiac: RRR, no murmurs, rubs, or gallops. No clubbing, cyanosis, edema.  Radials/PT 2+ and equal bilaterally.  Respiratory:  Respirations regular and unlabored, clear to auscultation bilaterally. GI: Soft, nontender, nondistended, BS + x 4. MS: no deformity or atrophy. Skin: warm and dry, no rash. Neuro:  Strength and sensation are intact. Psych: Normal affect.    Accessory Clinical Findings    ECG personally reviewed by me today -regular sinus rhythm, 67- no acute changes.  Lab Results  Component Value Date   WBC 7.9 08/24/2017   HGB 12.8 08/24/2017   HCT 36.4 08/24/2017   MCV 89.4 08/24/2017   PLT 325 08/24/2017   Lab Results  Component Value Date   CREATININE 1.12 (H) 11/29/2019   BUN 23 11/29/2019   NA 137 11/29/2019   K 4.4 11/29/2019    CL 101 11/29/2019   CO2 25 11/29/2019   Lab Results  Component Value Date   ALT 12 10/25/2019   AST 9 10/25/2019   ALKPHOS 67 10/25/2019   BILITOT 0.5 10/25/2019   Lab Results  Component Value Date   CHOL 156 10/25/2019   HDL 53 10/25/2019   LDLCALC 88 10/25/2019   TRIG 79 10/25/2019   CHOLHDL 2.9 10/25/2019     Assessment & Plan    1.  Dyspnea on exertion: Likely multifactorial in the setting of history of tobacco abuse, diastolic dysfunction, and pulmonary hypertension.  She does note mild lower extremity swelling at the end of the day but is not edematous today.  I suspect this is largely secondary to a sedentary lifestyle and venous insufficiency.  She is also on amlodipine which may be contributing some.  She was previously evaluated with echocardiogram that showed normal LV function, grade 1 diastolic dysfunction, and an RVSP of 47 mmHg.  Coronary CT angiography showed normal coronary arteries with a calcium score of 0.  She has not had any significant change to her degree of dyspnea on exertion since her last visit.  In discussing her pulmonary hypertension, she reported snoring and daytime somnolence.  Stop bang equals 6.  I will arrange for a watchpat one today.  She has not started an exercise program and I again encouraged that she do.  2.  Essential hypertension: Stable on amlodipine and lisinopril.  3.  Hyperlipidemia: LDL of 88 last September with normal LFTs.  Continue statin therapy.  4.  Pulmonary hypertension: As outlined above, RVSP of 47 mmHg on echo last November.  She is euvolemic on examination today.  Arranging for at home sleep study today.   5.  Diastolic dysfunction: As above, euvolemic on examination.  Discussed the importance of sodium restriction and daily weights.  Heart rate and blood pressure stable.  Sleep study as above.  Could consider loop diuretic versus MRA if volume becomes an issue going forward.  6.  Sleep disordered breathing: Patient  reports snoring and daytime somnolence/fatigue.  We will arrange for sleep study today.  7.  Mild to moderate mitral regurgitation: Plan to follow-up echo later this year.  8.  Aortic atherosclerosis: Noted on cardiac CTA.  She remains on aspirin and statin therapy.  9.  History of chest pain: Quiescent.  Normal coronary arteries with calcium score of 0 on coronary CT angiography.    10.  Disposition: Follow-up at home sleep study.  Follow-up in clinic in 6 months or sooner if necessary.   Nicolasa Ducking, NP 07/01/2020, 2:35 PM

## 2020-07-01 NOTE — Progress Notes (Signed)
STOP-BANG Rex assessment  S-snore  Have you been told that you snore?   Yes  T-tired Are you often tired, fatigued, sleepy during the day? Yes  O-obstruction Do you stop breathing, choke, or gasp during sleep? Yes  P-pressure Do you have or are you being treated for high blood pressure? Yes  B- BMI Is your BMI greater than 35 kg/m? Yes  A- age Are you 50 years or older? Yes  N-neck Do you have a neck circumference greater than 16 inches? No  G-gender Are you female? No  Total 6   Risk Score <3 Low risk of OSA ?3 High risk of OSA

## 2020-07-07 ENCOUNTER — Telehealth: Payer: Self-pay | Admitting: *Deleted

## 2020-07-07 NOTE — Telephone Encounter (Signed)
Spoke with patient and reviewed she can proceed with her WatchPat device. Provided her with PIN# of [id number]. Reviewed that if she should have any questions to please give [place] a call. She verbalized understanding with no further questions at this time.

## 2020-07-07 NOTE — Telephone Encounter (Signed)
-----   Message from Gaynelle Cage, CMA sent at 07/03/2020  8:21 AM EDT ----- Regarding: RE: [facility] WatchPat No PA is required. Ok to activate PIN. ----- Message ----- From: Bryna Colander, RN Sent: 07/01/2020   4:10 PM EDT To: [facility] Subject: Jaynie Bream                                 [facility] SN [id number] Enrolled on site Please let me know if you need any additional information.  Thanks, Rinaldo Cloud

## 2020-07-09 ENCOUNTER — Encounter (INDEPENDENT_AMBULATORY_CARE_PROVIDER_SITE_OTHER): Payer: Medicare Other | Admitting: Cardiology

## 2020-07-09 DIAGNOSIS — G4730 Sleep apnea, unspecified: Secondary | ICD-10-CM | POA: Diagnosis not present

## 2020-07-13 ENCOUNTER — Other Ambulatory Visit: Payer: Self-pay | Admitting: Family Medicine

## 2020-07-13 ENCOUNTER — Ambulatory Visit: Payer: Medicare Other

## 2020-07-13 DIAGNOSIS — I2721 Secondary pulmonary arterial hypertension: Secondary | ICD-10-CM

## 2020-07-13 DIAGNOSIS — I10 Essential (primary) hypertension: Secondary | ICD-10-CM

## 2020-07-13 DIAGNOSIS — G4730 Sleep apnea, unspecified: Secondary | ICD-10-CM

## 2020-07-13 DIAGNOSIS — I5189 Other ill-defined heart diseases: Secondary | ICD-10-CM

## 2020-07-13 DIAGNOSIS — R0609 Other forms of dyspnea: Secondary | ICD-10-CM

## 2020-07-13 NOTE — Telephone Encounter (Signed)
"  Must attend upcoming appt prior to further refills" pt has appt 6/23 Last RF 06/10/20 #30 tablets

## 2020-07-16 NOTE — Procedures (Signed)
      Sleep Study Report  Patient Information Study Date: Jul 09, 2020 Patient Name: Whitney Stuart Patient ID: [id number] Birth Date: Sep 06, 1950 Age: 70 Gender: Female Referring Physician: Nicolasa Ducking, NP  TEST DESCRIPTION: Home sleep apnea testing was completed using the WatchPat, a Type 1 device, utilizing peripheral arterial tonometry (PAT), chest movement, actigraphy, pulse oximetry, pulse rate, body position and snore. AHI was calculated with apnea and hypopnea using valid sleep time as the denominator. RDI includes apneas, hypopneas, and RERAs. The data acquired and the scoring of sleep and all associated events were performed in accordance with the recommended standards and specifications as outlined in the AASM Manual for the Scoring of Sleep and Associated Events 2.2.0 (2015).  FINDINGS: 1. No evidence of Obstructive Sleep Apnea with AHI 4.9/hr. 2. No Central Sleep Apnea. 3. Oxygen desaturations as low as 90%. 4. Minimal snoring was present. O2 sats were < 88% for 0 minutes. 5. Total sleep time was 6 hrs and 39 min. 6. 14.4% of total sleep time was spent in REM sleep. 7. Normal sleep onset latency at 28 min. 8. Prolonged REM sleep onset latency at 178 min. 9. Total awakenings were 10.  DIAGNOSIS: Normal study with no significant sleep disordered breathing.  RECOMMENDATIONS: 1. Normal study with no significant sleep disordered breathing.  2. Healthy sleep recommendations include: adequate nightly sleep (normal 7-9 hrs/night), avoidance of caffeine after noon and alcohol near bedtime, and maintaining a sleep environment that is cool, dark and quiet.  3. Weight loss for overweight patients is recommended.  4. Snoring recommendations include: weight loss where appropriate, side sleeping, and avoidance of alcohol before bed.  5. Operation of motor vehicle or dangerous equipment must be avoided when feeling drowsy, excessively sleepy, or mentally  fatigued.  6. An ENT consultation which may be useful for specific causes of and possible treatment of bothersome snoring .  7. Weight loss may be of benefit in reducing the severity of snoring.   Signature: Electronically Signed: Jul 13, 2020 Armanda Magic, MD; [facility]; Diplomat, American Board of Sleep Medicine

## 2020-07-17 ENCOUNTER — Other Ambulatory Visit: Payer: Self-pay

## 2020-07-17 ENCOUNTER — Encounter: Payer: Self-pay | Admitting: Family Medicine

## 2020-07-17 ENCOUNTER — Telehealth: Payer: Self-pay | Admitting: *Deleted

## 2020-07-17 ENCOUNTER — Ambulatory Visit (INDEPENDENT_AMBULATORY_CARE_PROVIDER_SITE_OTHER): Payer: Medicare Other | Admitting: Family Medicine

## 2020-07-17 VITALS — BP 128/80 | HR 67 | Temp 98.4°F | Resp 16 | Wt 200.9 lb

## 2020-07-17 DIAGNOSIS — E7800 Pure hypercholesterolemia, unspecified: Secondary | ICD-10-CM

## 2020-07-17 DIAGNOSIS — D692 Other nonthrombocytopenic purpura: Secondary | ICD-10-CM

## 2020-07-17 DIAGNOSIS — I10 Essential (primary) hypertension: Secondary | ICD-10-CM | POA: Diagnosis not present

## 2020-07-17 DIAGNOSIS — F4321 Adjustment disorder with depressed mood: Secondary | ICD-10-CM | POA: Diagnosis not present

## 2020-07-17 DIAGNOSIS — G4730 Sleep apnea, unspecified: Secondary | ICD-10-CM

## 2020-07-17 DIAGNOSIS — Z6832 Body mass index (BMI) 32.0-32.9, adult: Secondary | ICD-10-CM

## 2020-07-17 DIAGNOSIS — F411 Generalized anxiety disorder: Secondary | ICD-10-CM | POA: Diagnosis not present

## 2020-07-17 DIAGNOSIS — N1831 Chronic kidney disease, stage 3a: Secondary | ICD-10-CM

## 2020-07-17 DIAGNOSIS — E669 Obesity, unspecified: Secondary | ICD-10-CM

## 2020-07-17 MED ORDER — SERTRALINE HCL 50 MG PO TABS
50.0000 mg | ORAL_TABLET | ORAL | 2 refills | Status: DC
Start: 2020-07-17 — End: 2021-06-23

## 2020-07-17 MED ORDER — AMLODIPINE BESYLATE 10 MG PO TABS: 10.0000 mg | ORAL_TABLET | Freq: Every day | ORAL | 3 refills | Status: DC

## 2020-07-17 MED ORDER — LISINOPRIL 10 MG PO TABS: 1.0000 | ORAL_TABLET | Freq: Every day | ORAL | 3 refills | Status: DC

## 2020-07-17 MED ORDER — CLONAZEPAM 0.5 MG PO TABS
0.2500 mg | ORAL_TABLET | Freq: Two times a day (BID) | ORAL | 0 refills | Status: DC | PRN
Start: 2020-07-17 — End: 2021-08-17

## 2020-07-17 MED ORDER — ATORVASTATIN CALCIUM 10 MG PO TABS: 10.0000 mg | ORAL_TABLET | Freq: Every day | ORAL | 3 refills | Status: DC

## 2020-07-17 NOTE — Assessment & Plan Note (Signed)
Well controlled Continue current medications Recheck metabolic panel F/u in 6 months  

## 2020-07-17 NOTE — Telephone Encounter (Signed)
-----   Message from Quintella Reichert, MD sent at 07/16/2020  2:44 PM EDT ----- Normal home sleep study so in lab PSG will be ordered

## 2020-07-17 NOTE — Assessment & Plan Note (Signed)
Recheck metabolic panel.   

## 2020-07-17 NOTE — Assessment & Plan Note (Signed)
Discussed importance of healthy weight management Discussed diet and exercise  

## 2020-07-17 NOTE — Telephone Encounter (Addendum)
The patient has been notified of the result and verbalized understanding.  All questions (if any) were answered. Latrelle Dodrill, CMA 07/17/2020 1:10 PM    LMTCB for questions  NPSG SENT TO [facility]

## 2020-07-17 NOTE — Assessment & Plan Note (Signed)
Reviewed last lipid panel Continue statin Recheck at next visit 

## 2020-07-17 NOTE — Assessment & Plan Note (Signed)
Chronic and stable.   

## 2020-07-17 NOTE — Assessment & Plan Note (Signed)
Ongoing and intermittent Stable at this time Continue Zoloft at current dose Encourage therapy

## 2020-07-17 NOTE — Assessment & Plan Note (Signed)
Chronic and stable Well-controlled with Zoloft, which we will continue at current dose Encourage therapy

## 2020-07-17 NOTE — Progress Notes (Signed)
Established patient visit   Patient: Whitney Stuart   DOB: 03-Dec-1950   70 y.o. Female  MRN: [id number] Visit Date: 07/17/2020  Today's healthcare provider: Shirlee Latch, MD   Chief Complaint  Patient presents with   Depression   Hypertension   Hyperlipidemia   Subjective    Depression        Associated symptoms include no fatigue, no appetite change, no myalgias and no headaches. Hypertension Pertinent negatives include no chest pain, headaches, neck pain, palpitations or shortness of breath.  Hyperlipidemia Pertinent negatives include no chest pain, myalgias or shortness of breath.   Depression, Follow-up  She  was last seen for this 6 months ago. Changes made at last visit include Tolerating Zoloft well - will continue at current dose considered tapering and d/c in 6 months.    She reports excellent compliance with treatment. She is not having side effects.   She reports excellent tolerance of treatment. Current symptoms include: fatigue She feels she is Improved since last visit. On the zoloft she reports her mood fluctuates occasionally. Otherwise she has no complaints.   Depression screen [facility] 2/9 07/17/2020 10/25/2019 09/18/2019  Decreased Interest 0 1 3  Down, Depressed, Hopeless 0 0 2  PHQ - 2 Score 0 1 5  Altered sleeping 2 1 3   Tired, decreased energy 3 1 3   Change in appetite 0 1 3  Feeling bad or failure about yourself  0 0 0  Trouble concentrating 0 0 0  Moving slowly or fidgety/restless 0 0 0  Suicidal thoughts 0 0 0  PHQ-9 Score 5 4 14   Difficult doing work/chores Not difficult at all Not difficult at all Very difficult    ----------------------------------------------------------------------------------------- Lipid/Cholesterol, Follow-up  Last lipid panel Other pertinent labs  Lab Results  Component Value Date   CHOL 156 10/25/2019   HDL 53 10/25/2019   LDLCALC 88 10/25/2019   TRIG 79 10/25/2019   CHOLHDL 2.9 10/25/2019   Lab  Results  Component Value Date   ALT 12 10/25/2019   AST 9 10/25/2019   PLT 325 08/24/2017   TSH 0.888 11/22/2014     She was last seen for this 6 months ago.  Management since that visit includes seen by cardiology and patient was advised to Continue atorvastatin.  In setting of aortic atherosclerosis would recommend LDL goal less than 70.  She reports excellent compliance with treatment. She has mad improvements in diet and working towards losing weight.   She is not having side effects.   Symptoms: Yes chest pain Yes chest pressure/discomfort  Yes dyspnea Yes lower extremity edema  No numbness or tingling of extremity No orthopnea  No palpitations No paroxysmal nocturnal dyspnea  No speech difficulty No syncope   Current diet: well balanced Current exercise: no regular exercise  The 10-year ASCVD risk score 10/27/2019 DC Jr., et al., 2013) is: 10.5%  --------------------------------------------------------------------------------------------------- Hypertension, follow-up  BP Readings from Last 3 Encounters:  07/17/20 128/80  07/01/20 120/70  12/25/19 140/62   Wt Readings from Last 3 Encounters:  07/17/20 200 lb 14.4 oz (91.1 kg)  07/01/20 202 lb (91.6 kg)  12/25/19 208 lb (94.3 kg)     She was last seen for hypertension 6 months ago.  BP at that visit was 121/66. Management since that visit includes Continue present antihypertensive regimen including lisinopril 10 mg daily, amlodipine 10 mg daily.  She reports excellent compliance with treatment. She is not having side effects.  She  is following a Regular diet. She is not exercising. She does not smoke. She is tolerating 10 mg amlodipine and 10 mg lisinopril.   Use of agents associated with hypertension: none.   Outside blood pressures are 127/70 AVERAGE. Symptoms: Yes chest pain Yes chest pressure  No palpitations No syncope  Yes dyspnea No orthopnea  No paroxysmal nocturnal dyspnea No lower extremity edema    Pertinent labs: Lab Results  Component Value Date   CHOL 156 10/25/2019   HDL 53 10/25/2019   LDLCALC 88 10/25/2019   TRIG 79 10/25/2019   CHOLHDL 2.9 10/25/2019   Lab Results  Component Value Date   NA 137 11/29/2019   K 4.4 11/29/2019   CREATININE 1.12 (H) 11/29/2019   GFRNONAA 54 (L) 11/29/2019   GFRAA 69 10/25/2019   GLUCOSE 101 (H) 11/29/2019     The 10-year ASCVD risk score Denman George DC Jr., et al., 2013) is: 10.5%   ---------------------------------------------------------------------------------------------------   Patient Active Problem List   Diagnosis Date Noted   GAD (generalized anxiety disorder) 07/17/2020   Diastolic dysfunction    Chest pain    PAH (pulmonary artery hypertension) (HCC)    Sleep-disordered breathing    Mitral regurgitation    Hyperlipidemia    DOE (dyspnea on exertion) 11/27/2019   Chronic kidney disease, stage 3a (HCC) 10/25/2019   Complicated grief 09/18/2019   Senile purpura (HCC) 06/01/2019   Seasonal allergic rhinitis 05/15/2019   Asymptomatic microscopic hematuria 09/06/2017   Muscle spasms of neck 03/09/2017   Class 1 obesity with serious comorbidity and body mass index (BMI) of 32.0 to 32.9 in adult 02/09/2017   Calculus of gallbladder 11/21/2014   Arthralgia of hip 11/21/2014   Insomnia 11/21/2014   Pure hypercholesterolemia 04/11/2008   Asthma, exogenous 03/28/2008   Essential (primary) hypertension 03/28/2008   Personal history of malignant neoplasm of cervix uteri 03/28/2008   Social History   Tobacco Use   Smoking status: Former    Packs/day: 0.50    Years: 6.00    Pack years: 3.00    Types: Cigarettes    Quit date: 01/24/1997    Years since quitting: 23.4   Smokeless tobacco: Never  Vaping Use   Vaping Use: Never used  Substance Use Topics   Alcohol use: No   Drug use: No   No Known Allergies   Medications: Outpatient Medications Prior to Visit  Medication Sig   albuterol (VENTOLIN HFA) 108 (90 Base)  MCG/ACT inhaler TAKE 2 PUFFS BY MOUTH EVERY 6 HOURS AS NEEDED FOR WHEEZE OR SHORTNESS OF BREATH   aspirin 81 MG tablet Take 81 mg by mouth daily.   Calcium Carb-Cholecalciferol (CALCIUM 600+D3 PO) Take by mouth daily.   cyclobenzaprine (FLEXERIL) 5 MG tablet TAKE 1 TABLET BY MOUTH AT BEDTIME AS NEEDED FOR MUSCLE SPASMS.   fluticasone furoate-vilanterol (BREO ELLIPTA) 200-25 MCG/INH AEPB Inhale 1 puff into the lungs daily.   loratadine (CLARITIN) 10 MG tablet Take 10 mg by mouth daily.   ondansetron (ZOFRAN) 4 MG tablet Take 1 tablet (4 mg total) by mouth every 8 (eight) hours as needed for nausea or vomiting.   [DISCONTINUED] amLODipine (NORVASC) 10 MG tablet Take 1 tablet (10 mg total) by mouth daily.   [DISCONTINUED] atorvastatin (LIPITOR) 10 MG tablet Take 1 tablet (10 mg total) by mouth daily.   [DISCONTINUED] lisinopril (ZESTRIL) 10 MG tablet TAKE 1 TABLET BY MOUTH EVERY DAY   [DISCONTINUED] sertraline (ZOLOFT) 50 MG tablet Take 1 tablet (50 mg total) by  mouth every other day.   No facility-administered medications prior to visit.    Review of Systems  Constitutional:  Negative for activity change, appetite change, chills, fatigue and fever.  HENT:  Negative for ear pain, sinus pain and sore throat.   Eyes:  Negative for pain.  Respiratory:  Negative for cough, chest tightness, shortness of breath and wheezing.   Cardiovascular:  Positive for leg swelling. Negative for chest pain and palpitations.  Gastrointestinal:  Negative for abdominal pain, blood in stool, diarrhea, nausea and vomiting.  Genitourinary:  Negative for flank pain, frequency, pelvic pain and urgency.  Musculoskeletal:  Negative for back pain, myalgias and neck pain.  Neurological:  Negative for dizziness, weakness, light-headedness, numbness and headaches.  Psychiatric/Behavioral:  Positive for depression.        Objective    BP 128/80 Comment: home readings  Pulse 67   Temp 98.4 F (36.9 C) (Oral)   Resp 16    Wt 200 lb 14.4 oz (91.1 kg)   SpO2 98%   BMI 32.43 kg/m  BP Readings from Last 3 Encounters:  07/17/20 128/80  07/01/20 120/70  12/25/19 140/62   Wt Readings from Last 3 Encounters:  07/17/20 200 lb 14.4 oz (91.1 kg)  07/01/20 202 lb (91.6 kg)  12/25/19 208 lb (94.3 kg)    Physical Exam Vitals reviewed.  Constitutional:      General: She is not in acute distress.    Appearance: Normal appearance. She is well-developed. She is not diaphoretic.  HENT:     Head: Normocephalic and atraumatic.  Eyes:     General: No scleral icterus.    Conjunctiva/sclera: Conjunctivae normal.  Neck:     Thyroid: No thyromegaly.  Cardiovascular:     Rate and Rhythm: Normal rate and regular rhythm.     Pulses: Normal pulses.     Heart sounds: Normal heart sounds. No murmur heard. Pulmonary:     Effort: Pulmonary effort is normal. No respiratory distress.     Breath sounds: Normal breath sounds. No wheezing, rhonchi or rales.  Musculoskeletal:        General: Swelling present.     Cervical back: Neck supple.     Right lower leg: No edema.     Left lower leg: No edema.  Lymphadenopathy:     Cervical: No cervical adenopathy.  Skin:    General: Skin is warm and dry.     Findings: No rash.  Neurological:     Mental Status: She is alert and oriented to person, place, and time. Mental status is at baseline.  Psychiatric:        Mood and Affect: Mood normal.        Behavior: Behavior normal.     No results found for any visits on 07/17/20.  Assessment & Plan     Problem List Items Addressed This Visit       Cardiovascular and Mediastinum   Essential (primary) hypertension    Well controlled Continue current medications Recheck metabolic panel F/u in 6 months        Relevant Medications   lisinopril (ZESTRIL) 10 MG tablet   atorvastatin (LIPITOR) 10 MG tablet   amLODipine (NORVASC) 10 MG tablet   Other Relevant Orders   Basic Metabolic Panel (BMET)   Senile purpura (HCC)     Chronic and stable       Relevant Medications   lisinopril (ZESTRIL) 10 MG tablet   atorvastatin (LIPITOR) 10 MG tablet   amLODipine (  NORVASC) 10 MG tablet     Genitourinary   Chronic kidney disease, stage 3a (HCC)    Recheck metabolic panel         Other   Pure hypercholesterolemia    Reviewed last lipid panel Continue statin Recheck at next visit       Relevant Medications   lisinopril (ZESTRIL) 10 MG tablet   atorvastatin (LIPITOR) 10 MG tablet   amLODipine (NORVASC) 10 MG tablet   Class 1 obesity with serious comorbidity and body mass index (BMI) of 32.0 to 32.9 in adult    Discussed importance of healthy weight management Discussed diet and exercise        Complicated grief    Ongoing and intermittent Stable at this time Continue Zoloft at current dose Encourage therapy       GAD (generalized anxiety disorder) - Primary    Chronic and stable Well-controlled with Zoloft, which we will continue at current dose Encourage therapy       Relevant Medications   sertraline (ZOLOFT) 50 MG tablet    Return in about 6 months (around 01/16/2021) for AWV, CPE.      I,Essence Turner,acting as a scribe for Shirlee Latch, MD.,have documented all relevant documentation on the behalf of Shirlee Latch, MD,as directed by  Shirlee Latch, MD while in the presence of Shirlee Latch, MD.  I, Shirlee Latch, MD, have reviewed all documentation for this visit. The documentation on 07/17/20 for the exam, diagnosis, procedures, and orders are all accurate and complete.   Zeev Deakins, Marzella Schlein, MD, MPH [facility]

## 2020-07-18 ENCOUNTER — Ambulatory Visit
Admission: RE | Admit: 2020-07-18 | Discharge: 2020-07-18 | Disposition: A | Discharge status: Home / Self Care | Payer: Medicare Other | Source: Ambulatory Visit | Attending: Family Medicine | Admitting: Family Medicine

## 2020-07-18 ENCOUNTER — Telehealth: Payer: Self-pay

## 2020-07-18 DIAGNOSIS — Z1231 Encounter for screening mammogram for malignant neoplasm of breast: Secondary | ICD-10-CM | POA: Insufficient documentation

## 2020-07-18 DIAGNOSIS — I10 Essential (primary) hypertension: Secondary | ICD-10-CM

## 2020-07-18 LAB — BASIC METABOLIC PANEL
BUN/Creatinine Ratio: 16 (ref 12–28)
BUN: 21 mg/dL (ref 8–27)
CO2: 22 mmol/L (ref 20–29)
Calcium: 9.9 mg/dL (ref 8.7–10.3)
Chloride: 104 mmol/L (ref 96–106)
Creatinine, Ser: 1.29 mg/dL — ABNORMAL HIGH (ref 0.57–1.00)
Glucose: 87 mg/dL (ref 65–99)
Potassium: 5 mmol/L (ref 3.5–5.2)
Sodium: 139 mmol/L (ref 134–144)
eGFR: 45 mL/min/{1.73_m2} — ABNORMAL LOW (ref >59)

## 2020-07-18 NOTE — Telephone Encounter (Signed)
-----   Message from Erasmo Downer, MD sent at 07/18/2020  8:01 AM EDT ----- Slight worsening in kidney function. Hydrate well and repeat in 2-4 weeks.

## 2020-07-20 ENCOUNTER — Other Ambulatory Visit: Payer: Self-pay | Admitting: Physician Assistant

## 2020-07-20 ENCOUNTER — Other Ambulatory Visit: Payer: Self-pay | Admitting: Family Medicine

## 2020-07-20 DIAGNOSIS — G4700 Insomnia, unspecified: Secondary | ICD-10-CM

## 2020-07-20 DIAGNOSIS — J4520 Mild intermittent asthma, uncomplicated: Secondary | ICD-10-CM

## 2020-07-20 NOTE — Telephone Encounter (Signed)
dc'd 11/15/19 "change of therapy" Joycelyn Man PA-C

## 2020-08-15 ENCOUNTER — Other Ambulatory Visit: Payer: Self-pay | Admitting: Family Medicine

## 2020-08-15 NOTE — Telephone Encounter (Signed)
Requested medication (s) are due for refill today:yes   Requested medication (s) are on the active medication list: yes  Last refill:  07/20/2020  Future visit scheduled: yes   Notes to clinic:  this refill cannot be delegated   Requested Prescriptions  Pending Prescriptions Disp Refills   cyclobenzaprine (FLEXERIL) 5 MG tablet [Pharmacy Med Name: CYCLOBENZAPRINE 5 MG TABLET] 30 tablet 1    Sig: TAKE 1 TABLET BY MOUTH AT BEDTIME AS NEEDED FOR MUSCLE SPASMS.      Not Delegated - Analgesics:  Muscle Relaxants Failed - 08/15/2020 11:44 AM      Failed - This refill cannot be delegated      Passed - Valid encounter within last 6 months    Recent Outpatient Visits           4 weeks ago GAD (generalized anxiety disorder)   [facility] [place], Marzella Schlein, [place]   8 months ago Complicated grief   [facility] [place], Marzella Schlein, MD   9 months ago Mild intermittent extrinsic asthma without complication   [facility] Joycelyn Man M, [place]   9 months ago Complicated grief   [facility] [place], Marzella Schlein, [place]   11 months ago Complicated grief   [facility], Marzella Schlein, MD

## 2020-08-20 DIAGNOSIS — I10 Essential (primary) hypertension: Secondary | ICD-10-CM | POA: Diagnosis not present

## 2020-08-21 LAB — BASIC METABOLIC PANEL
BUN/Creatinine Ratio: 19 (ref 12–28)
BUN: 21 mg/dL (ref 8–27)
CO2: 23 mmol/L (ref 20–29)
Calcium: 9.6 mg/dL (ref 8.7–10.3)
Chloride: 105 mmol/L (ref 96–106)
Creatinine, Ser: 1.12 mg/dL — ABNORMAL HIGH (ref 0.57–1.00)
Glucose: 93 mg/dL (ref 65–99)
Potassium: 4.7 mmol/L (ref 3.5–5.2)
Sodium: 140 mmol/L (ref 134–144)
eGFR: 53 mL/min/{1.73_m2} — ABNORMAL LOW (ref >59)

## 2020-09-05 LAB — HM HEPATITIS C SCREENING LAB: HM Hepatitis Screen: NEGATIVE

## 2020-10-25 ENCOUNTER — Other Ambulatory Visit: Payer: Self-pay | Admitting: Family Medicine

## 2020-10-25 NOTE — Telephone Encounter (Signed)
Requested medication (s) are due for refill today: yes  Requested medication (s) are on the active medication list: yes  Last refill:  08/18/20  Future visit scheduled: yes  Notes to clinic:  med not delegated to NT to RF   Requested Prescriptions  Pending Prescriptions Disp Refills   cyclobenzaprine (FLEXERIL) 5 MG tablet [Pharmacy Med Name: CYCLOBENZAPRINE 5 MG TABLET] 30 tablet 1    Sig: TAKE 1 TABLET BY MOUTH AT BEDTIME AS NEEDED FOR MUSCLE SPASMS.     Not Delegated - Analgesics:  Muscle Relaxants Failed - 10/25/2020  2:07 PM      Failed - This refill cannot be delegated      Passed - Valid encounter within last 6 months    Recent Outpatient Visits           3 months ago GAD (generalized anxiety disorder)   [facility] [place], Marzella Schlein, [place]   11 months ago Complicated grief   [facility] [place], Marzella Schlein, MD   11 months ago Mild intermittent extrinsic asthma without complication   [facility] [place], Alessandra Bevels, [place]   1 year ago Complicated grief   [facility] [place], Marzella Schlein, MD   1 year ago Complicated grief   [facility] [place], Marzella Schlein, MD

## 2020-12-08 ENCOUNTER — Other Ambulatory Visit: Payer: Self-pay

## 2020-12-08 ENCOUNTER — Telehealth: Payer: Self-pay

## 2020-12-08 ENCOUNTER — Ambulatory Visit (INDEPENDENT_AMBULATORY_CARE_PROVIDER_SITE_OTHER): Payer: Medicare Other

## 2020-12-08 VITALS — BP 158/78 | HR 86 | Temp 98.5°F | Resp 20 | Ht 66.0 in | Wt 207.8 lb

## 2020-12-08 DIAGNOSIS — Z0000 Encounter for general adult medical examination without abnormal findings: Secondary | ICD-10-CM

## 2020-12-08 NOTE — Telephone Encounter (Signed)
Copied from CRM [id number]. Topic: General - Other >> Dec 08, 2020  8:51 AM Whitney Stuart wrote: Reason for CRM: Pt calling and is requesting to know if she needs to fast for her AWV appt this afternoon. Please advise.

## 2020-12-08 NOTE — Telephone Encounter (Signed)
Patient advised she does not have to be fasting for appt.

## 2020-12-08 NOTE — Patient Instructions (Signed)
Ms. Whitney Stuart , Thank you for taking time to come for your Medicare Wellness Visit. I appreciate your ongoing commitment to your health goals. Please review the following plan we discussed and let me know if I can assist you in the future.   Screening recommendations/referrals: Colonoscopy: cologuard 02/07/17 Mammogram: 07/18/20 Bone Density: 02/22/17 Recommended yearly ophthalmology/optometry visit for glaucoma screening and checkup Recommended yearly dental visit for hygiene and checkup  Vaccinations: Influenza vaccine: declined Pneumococcal vaccine: 03/07/18 Tdap vaccine: 06/01/19 Shingles vaccine: declined   Covid-19:declined  Advanced directives: given paper work, requested notarized copy  Conditions/risks identified:   Next appointment: Follow up in one year for your annual wellness visit    Preventive Care 65 Years and Older, Female Preventive care refers to lifestyle choices and visits with your health care provider that can promote health and wellness. What does preventive care include? A yearly physical exam. This is also called an annual well check. Dental exams once or twice a year. Routine eye exams. Ask your health care provider how often you should have your eyes checked. Personal lifestyle choices, including: Daily care of your teeth and gums. Regular physical activity. Eating a healthy diet. Avoiding tobacco and drug use. Limiting alcohol use. Practicing safe sex. Taking low-dose aspirin every day. Taking vitamin and mineral supplements as recommended by your health care provider. What happens during an annual well check? The services and screenings done by your health care provider during your annual well check will depend on your age, overall health, lifestyle risk factors, and family history of disease. Counseling  Your health care provider may ask you questions about your: Alcohol use. Tobacco use. Drug use. Emotional well-being. Home and relationship  well-being. Sexual activity. Eating habits. History of falls. Memory and ability to understand (cognition). Work and work Astronomer. Reproductive health. Screening  You may have the following tests or measurements: Height, weight, and BMI. Blood pressure. Lipid and cholesterol levels. These may be checked every 5 years, or more frequently if you are over 62 years old. Skin check. Lung cancer screening. You may have this screening every year starting at age 72 if you have a 30-pack-year history of smoking and currently smoke or have quit within the past 15 years. Fecal occult blood test (FOBT) of the stool. You may have this test every year starting at age 5. Flexible sigmoidoscopy or colonoscopy. You may have a sigmoidoscopy every 5 years or a colonoscopy every 10 years starting at age 57. Hepatitis C blood test. Hepatitis B blood test. Sexually transmitted disease (STD) testing. Diabetes screening. This is done by checking your blood sugar (glucose) after you have not eaten for a while (fasting). You may have this done every 1-3 years. Bone density scan. This is done to screen for osteoporosis. You may have this done starting at age 41. Mammogram. This may be done every 1-2 years. Talk to your health care provider about how often you should have regular mammograms. Talk with your health care provider about your test results, treatment options, and if necessary, the need for more tests. Vaccines  Your health care provider may recommend certain vaccines, such as: Influenza vaccine. This is recommended every year. Tetanus, diphtheria, and acellular pertussis (Tdap, Td) vaccine. You may need a Td booster every 10 years. Zoster vaccine. You may need this after age 50. Pneumococcal 13-valent conjugate (PCV13) vaccine. One dose is recommended after age 85. Pneumococcal polysaccharide (PPSV23) vaccine. One dose is recommended after age 20. Talk to your health care provider  about which  screenings and vaccines you need and how often you need them. This information is not intended to replace advice given to you by your health care provider. Make sure you discuss any questions you have with your health care provider. Document Released: 02/07/2015 Document Revised: 10/01/2015 Document Reviewed: 11/12/2014 Elsevier Interactive Patient Education  2017 [place] Prevention in the Home Falls can cause injuries. They can happen to people of all ages. There are many things you can do to make your home safe and to help prevent falls. What can I do on the outside of my home? Regularly fix the edges of walkways and driveways and fix any cracks. Remove anything that might make you trip as you walk through a door, such as a raised step or threshold. Trim any bushes or trees on the path to your home. Use bright outdoor lighting. Clear any walking paths of anything that might make someone trip, such as rocks or tools. Regularly check to see if handrails are loose or broken. Make sure that both sides of any steps have handrails. Any raised decks and porches should have guardrails on the edges. Have any leaves, snow, or ice cleared regularly. Use sand or salt on walking paths during winter. Clean up any spills in your garage right away. This includes oil or grease spills. What can I do in the bathroom? Use night lights. Install grab bars by the toilet and in the tub and shower. Do not use towel bars as grab bars. Use non-skid mats or decals in the tub or shower. If you need to sit down in the shower, use a plastic, non-slip stool. Keep the floor dry. Clean up any water that spills on the floor as soon as it happens. Remove soap buildup in the tub or shower regularly. Attach bath mats securely with double-sided non-slip rug tape. Do not have throw rugs and other things on the floor that can make you trip. What can I do in the bedroom? Use night lights. Make sure that you have a  light by your bed that is easy to reach. Do not use any sheets or blankets that are too big for your bed. They should not hang down onto the floor. Have a firm chair that has side arms. You can use this for support while you get dressed. Do not have throw rugs and other things on the floor that can make you trip. What can I do in the kitchen? Clean up any spills right away. Avoid walking on wet floors. Keep items that you use a lot in easy-to-reach places. If you need to reach something above you, use a strong step stool that has a grab bar. Keep electrical cords out of the way. Do not use floor polish or wax that makes floors slippery. If you must use wax, use non-skid floor wax. Do not have throw rugs and other things on the floor that can make you trip. What can I do with my stairs? Do not leave any items on the stairs. Make sure that there are handrails on both sides of the stairs and use them. Fix handrails that are broken or loose. Make sure that handrails are as long as the stairways. Check any carpeting to make sure that it is firmly attached to the stairs. Fix any carpet that is loose or worn. Avoid having throw rugs at the top or bottom of the stairs. If you do have throw rugs, attach them to the floor  with carpet tape. Make sure that you have a light switch at the top of the stairs and the bottom of the stairs. If you do not have them, ask someone to add them for you. What else can I do to help prevent falls? Wear shoes that: Do not have high heels. Have rubber bottoms. Are comfortable and fit you well. Are closed at the toe. Do not wear sandals. If you use a stepladder: Make sure that it is fully opened. Do not climb a closed stepladder. Make sure that both sides of the stepladder are locked into place. Ask someone to hold it for you, if possible. Clearly mark and make sure that you can see: Any grab bars or handrails. First and last steps. Where the edge of each step  is. Use tools that help you move around (mobility aids) if they are needed. These include: Canes. Walkers. Scooters. Crutches. Turn on the lights when you go into a dark area. Replace any light bulbs as soon as they burn out. Set up your furniture so you have a clear path. Avoid moving your furniture around. If any of your floors are uneven, fix them. If there are any pets around you, be aware of where they are. Review your medicines with your doctor. Some medicines can make you feel dizzy. This can increase your chance of falling. Ask your doctor what other things that you can do to help prevent falls. This information is not intended to replace advice given to you by your health care provider. Make sure you discuss any questions you have with your health care provider. Document Released: 11/07/2008 Document Revised: 06/19/2015 Document Reviewed: 02/15/2014 Elsevier Interactive Patient Education  2017 [employer].

## 2020-12-08 NOTE — Progress Notes (Signed)
Subjective:   Whitney Stuart is a 70 y.o. female who presents for Medicare Annual (Subsequent) preventive examination.  Review of Systems           Objective:    Today's Vitals   12/08/20 1423  BP: (!) 158/78  Pulse: 86  Resp: 20  Temp: 98.5 F (36.9 C)  TempSrc: Oral  Weight: 207 lb 12.8 oz (94.3 kg)  Height: 5\' 6"  (1.676 m)   Body mass index is 33.54 kg/m.  Advanced Directives 12/08/2020 03/14/2019 03/07/2018 08/24/2017  Does Patient Have a Medical Advance Directive? No No No No  Would patient like information on creating a medical advance directive? No - Patient declined No - Patient declined No - Patient declined No - Patient declined    Current Medications (verified) Outpatient Encounter Medications as of 12/08/2020  Medication Sig   albuterol (VENTOLIN HFA) 108 (90 Base) MCG/ACT inhaler TAKE 2 PUFFS BY MOUTH EVERY 6 HOURS AS NEEDED FOR WHEEZE OR SHORTNESS OF BREATH   amLODipine (NORVASC) 10 MG tablet Take 1 tablet (10 mg total) by mouth daily.   aspirin 81 MG tablet Take 81 mg by mouth daily.   atorvastatin (LIPITOR) 10 MG tablet Take 1 tablet (10 mg total) by mouth daily.   BREO ELLIPTA 200-25 MCG/INH AEPB TAKE 1 PUFF BY MOUTH EVERY DAY   Calcium Carb-Cholecalciferol (CALCIUM 600+D3 PO) Take by mouth daily.   clonazePAM (KLONOPIN) 0.5 MG tablet Take 0.5-1 tablets (0.25-0.5 mg total) by mouth 2 (two) times daily as needed for anxiety.   cyclobenzaprine (FLEXERIL) 5 MG tablet TAKE 1 TABLET BY MOUTH AT BEDTIME AS NEEDED FOR MUSCLE SPASMS.   lisinopril (ZESTRIL) 10 MG tablet Take 1 tablet (10 mg total) by mouth daily.   loratadine (CLARITIN) 10 MG tablet Take 10 mg by mouth daily.   ondansetron (ZOFRAN) 4 MG tablet Take 1 tablet (4 mg total) by mouth every 8 (eight) hours as needed for nausea or vomiting.   sertraline (ZOLOFT) 50 MG tablet Take 1 tablet (50 mg total) by mouth every other day.   No facility-administered encounter medications on file as of 12/08/2020.     Allergies (verified) Patient has no known allergies.   History: Past Medical History:  Diagnosis Date   Arthritis    Asthma    Asthma    Chest pain    a. 11/2019 Cor CTA: Calcium score equals 0.  Normal coronary arteries.   Diastolic dysfunction    a. 11/2019 Echo: EF 60-65%.  Grade 1 diastolic dysfunction.  No regional wall motion abnormalities.  RVSP 47 mmHg.  Moderately dilated left atrium.  Mild to moderate MR.   GERD (gastroesophageal reflux disease)    History of cervical cancer    Hyperlipidemia    Hypertension    Mitral regurgitation    PAH (pulmonary artery hypertension) (HCC)    a. 11/2019 Echo: RVSP .   Sleep-disordered breathing    Past Surgical History:  Procedure Laterality Date   ABDOMINAL HYSTERECTOMY  1999   total   HAND SURGERY Bilateral 2003   carpal tunnel   LIPOMA EXCISION     Family History  Problem Relation Age of Onset   Hypertension Father    Kidney disease Father    Heart disease Mother    Stroke Mother    Congestive Heart Failure Mother    Hypertension Mother    Heart murmur Sister    Hypertension Sister    Lung cancer Brother    Hypertension Brother  Hypertension Brother    Sleep apnea Brother    Mental illness Maternal Aunt    Cancer Sister        ovarian   Hypertension Sister    Hypertension Sister    Hypertension Brother    Heart disease Brother    Hypertension Brother    Hypertension Brother    Hypertension Brother    Hypertension Brother    Hypertension Brother    Colon cancer Neg Hx    Breast cancer Neg Hx    Social History   Socioeconomic History   Marital status: Married    Spouse name: Emryn Flanery   Number of children: 7   Years of education: Not on file   Highest education level: GED or equivalent  Occupational History   Occupation: Retired  Tobacco Use   Smoking status: Former    Packs/day: 0.50    Years: 6.00    Pack years: 3.00    Types: Cigarettes    Quit date: 01/24/1997    Years  since quitting: 23.8   Smokeless tobacco: Never  Vaping Use   Vaping Use: Never used  Substance and Sexual Activity   Alcohol use: No   Drug use: No   Sexual activity: Yes    Birth control/protection: Surgical  Other Topics Concern   Not on file  Social History Narrative   Not on file   Social Determinants of Health   Financial Resource Strain: Low Risk    Difficulty of Paying Living Expenses: Not hard at all  Food Insecurity: No Food Insecurity   Worried About Programme researcher, broadcasting/film/video in the Last Year: Never true   Ran Out of Food in the Last Year: Never true  Transportation Needs: No Transportation Needs   Lack of Transportation (Medical): No   Lack of Transportation (Non-Medical): No  Physical Activity: Insufficiently Active   Days of Exercise per Week: 6 days   Minutes of Exercise per Session: 20 min  Stress: No Stress Concern Present   Feeling of Stress : Only a little  Social Connections: Moderately Isolated   Frequency of Communication with Friends and Family: More than three times a week   Frequency of Social Gatherings with Friends and Family: More than three times a week   Attends Religious Services: More than 4 times per year   Active Member of [employer] or Organizations: No   Attends Banker Meetings: Never   Marital Status: Widowed    Tobacco Counseling Counseling given: Not Answered   Clinical Intake:  Pre-visit preparation completed: Yes  Pain : No/denies pain     Nutritional Status: BMI > 30  Obese Diabetes: No  How often do you need to have someone help you when you read instructions, pamphlets, or other written materials from your doctor or pharmacy?: 1 - Never  Interpreter Needed?: No  Information entered by :: Kennedy Bucker, LPN   Activities of Daily Living No flowsheet data found.  Patient Care Team: Erasmo Downer, MD as PCP - General (Family Medicine) Iran Ouch, MD as PCP - Cardiology (Cardiology) Elliot Cousin, MD as Consulting Physician (Ophthalmology)  Indicate any recent Medical Services you may have received from other than [facility] providers in the past year (date may be approximate).     Assessment:   This is a routine wellness examination for Whitney Stuart.  Hearing/Vision screen Hearing Screening - Comments:: good Vision Screening - Comments:: Sees [facility]- has glasses  Dietary issues and  exercise activities discussed:     Goals Addressed             This Visit's Progress    Exercise 150 minutes per week (moderate activity)       Lose weight, eat healthy & exerise       Depression Screen PHQ 2/9 Scores 07/17/2020 10/25/2019 09/18/2019 03/14/2019 03/07/2018 03/07/2018 01/12/2017  PHQ - 2 Score 0 1 5 0 0 0 0  PHQ- 9 Score 5 4 14  - 4 - -    Fall Risk Fall Risk  12/08/2020 10/25/2019 03/14/2019 03/07/2018 01/12/2017  Falls in the past year? 0 0 0 0 Yes  Number falls in past yr: 0 0 0 - 1  Injury with Fall? 0 0 0 - No  Risk for fall due to : No Fall Risks No Fall Risks - - -  Follow up Falls prevention discussed Falls evaluation completed - - Falls evaluation completed    FALL RISK PREVENTION PERTAINING TO THE HOME:  Any stairs in or around the home? Yes  If so, are there any without handrails? No  Home free of loose throw rugs in walkways, pet beds, electrical cords, etc? Yes  Adequate lighting in your home to reduce risk of falls? Yes   ASSISTIVE DEVICES UTILIZED TO PREVENT FALLS:  Life alert? No  Use of a cane, walker or w/c? No  Grab bars in the bathroom? No  Shower chair or bench in shower? No  Elevated toilet seat or a handicapped toilet? No   TIMED UP AND GO:  Was the test performed? Yes .  Length of time to ambulate 10 feet: 3 sec.   Gait steady and fast without use of assistive device  Cognitive Function: Normal cognitive status assessed by direct observation by this Nurse Health Advisor. No abnormalities found.           Immunizations Immunization History  Administered Date(s) Administered   Fluad Quad(high Dose 65+) 10/25/2019   Influenza, High Dose Seasonal PF 01/12/2017, 03/07/2018   Pneumococcal Conjugate-13 01/12/2017   Pneumococcal Polysaccharide-23 03/07/2018   Td 08/23/1995, 06/01/2019    TDAP status: Up to date  Flu Vaccine status: Declined, Education has been provided regarding the importance of this vaccine but patient still declined. Advised may receive this vaccine at local pharmacy or Health Dept. Aware to provide a copy of the vaccination record if obtained from local pharmacy or Health Dept. Verbalized acceptance and understanding.  Pneumococcal vaccine status: Up to date  Covid-19 vaccine status: Declined, Education has been provided regarding the importance of this vaccine but patient still declined. Advised may receive this vaccine at local pharmacy or Health Dept.or vaccine clinic. Aware to provide a copy of the vaccination record if obtained from local pharmacy or Health Dept. Verbalized acceptance and understanding.  Qualifies for Shingles Vaccine? No   Zostavax completed No   Shingrix Completed?: No.    Education has been provided regarding the importance of this vaccine. Patient has been advised to call insurance company to determine out of pocket expense if they have not yet received this vaccine. Advised may also receive vaccine at local pharmacy or Health Dept. Verbalized acceptance and understanding.  Screening Tests Health Maintenance  Topic Date Due   COVID-19 Vaccine (1) Never done   Zoster Vaccines- Shingrix (1 of 2) Never done   Fecal DNA (Cologuard)  02/08/2020   INFLUENZA VACCINE  08/25/2020   DEXA SCAN  02/22/2022   MAMMOGRAM  07/19/2022   TETANUS/TDAP  05/31/2029   Pneumonia Vaccine 43+ Years old  Completed   Hepatitis C Screening  Completed   HPV VACCINES  Aged Out    Health Maintenance  Health Maintenance Due  Topic Date Due   COVID-19 Vaccine (1)  Never done   Zoster Vaccines- Shingrix (1 of 2) Never done   Fecal DNA (Cologuard)  02/08/2020   INFLUENZA VACCINE  08/25/2020    Colorectal cancer screening: Type of screening: Cologuard. Completed 02/07/2017. Repeat every 3 years  Mammogram status: Completed 07/18/2020. Repeat every year  Bone Density status: Completed 02/22/2017. Results reflect: Bone density results: OSTEOPENIA. Repeat every 5 years.  Lung Cancer Screening: (Low Dose CT Chest recommended if Age 20-80 years, 30 pack-year currently smoking OR have quit w/in 15years.) does not qualify.    Additional Screening:  Hepatitis C Screening: does qualify; Completed 09/05/2020  Vision Screening: Recommended annual ophthalmology exams for early detection of glaucoma and other disorders of the eye. Is the patient up to date with their annual eye exam?  Yes  Who is the provider or what is the name of the office in which the patient attends annual eye exams? [facility]  Dental Screening: Recommended annual dental exams for proper oral hygiene  Community Resource Referral / Chronic Care Management: CRR required this visit?  No   CCM required this visit?  No      Plan:     I have personally reviewed and noted the following in the patient's chart:   Medical and social history Use of alcohol, tobacco or illicit drugs  Current medications and supplements including opioid prescriptions.  Functional ability and status Nutritional status Physical activity Advanced directives List of other physicians Hospitalizations, surgeries, and ER visits in previous 12 months Vitals Screenings to include cognitive, depression, and falls Referrals and appointments  In addition, I have reviewed and discussed with patient certain preventive protocols, quality metrics, and best practice recommendations. A written personalized care plan for preventive services as well as general preventive health recommendations were provided to  patient.     Hal Hope, LPN   19/14/7829   Nurse Notes: none

## 2020-12-24 ENCOUNTER — Telehealth: Payer: Self-pay | Admitting: *Deleted

## 2020-12-24 DIAGNOSIS — I340 Nonrheumatic mitral (valve) insufficiency: Secondary | ICD-10-CM

## 2020-12-24 DIAGNOSIS — I2721 Secondary pulmonary arterial hypertension: Secondary | ICD-10-CM

## 2020-12-24 NOTE — Telephone Encounter (Signed)
-----   Message from Dalia Heading sent at 12/24/2020 12:55 PM EST ----- Regarding: please change order This patient's echocardiogram order expires today.  Her echo is scheduled in January .

## 2020-12-24 NOTE — Telephone Encounter (Signed)
Pt's echo is scheduled 02/03/21.  Echo ordered at office visit 12/25/19 with Gillian Shields, NP.  Order has expired as of today 12/24/20.   New order placed for complete echo for pulmonary hypertension and non-rheumatic mitral valve regurgitation.  Will forward to scheduling. Echo will need to be rescheduled off of new order.

## 2021-01-06 ENCOUNTER — Telehealth: Payer: Self-pay

## 2021-01-06 NOTE — Telephone Encounter (Signed)
Letter has been sent to patient informing them that their sleep study has expired. Patient will need to call and schedule an office visit to re-evaluate the need for a sleep study.    

## 2021-01-20 ENCOUNTER — Other Ambulatory Visit: Payer: Self-pay | Admitting: Family Medicine

## 2021-01-20 DIAGNOSIS — J4520 Mild intermittent asthma, uncomplicated: Secondary | ICD-10-CM

## 2021-02-03 ENCOUNTER — Other Ambulatory Visit: Payer: Self-pay

## 2021-02-03 ENCOUNTER — Ambulatory Visit (INDEPENDENT_AMBULATORY_CARE_PROVIDER_SITE_OTHER): Payer: Commercial Managed Care - HMO

## 2021-02-03 DIAGNOSIS — I340 Nonrheumatic mitral (valve) insufficiency: Secondary | ICD-10-CM | POA: Diagnosis not present

## 2021-02-03 DIAGNOSIS — I2721 Secondary pulmonary arterial hypertension: Secondary | ICD-10-CM

## 2021-02-03 LAB — ECHOCARDIOGRAM COMPLETE
AR max vel: 2.77 cm2
AV Area VTI: 2.98 cm2
AV Area mean vel: 3.04 cm2
AV Mean grad: 6 mmHg
AV Peak grad: 10.1 mmHg
Ao pk vel: 1.59 m/s
Area-P 1/2: 3.74 cm2
Calc EF: 57.8 %
S' Lateral: 2.1 cm
Single Plane A2C EF: 55 %
Single Plane A4C EF: 60.2 %

## 2021-02-04 ENCOUNTER — Telehealth: Payer: Self-pay | Admitting: Emergency Medicine

## 2021-02-04 NOTE — Telephone Encounter (Signed)
Called patient to go over echo results. Pt verbalized understanding.   Overdue 6 month f/u scheduled with Dr. Kirke Corin for 02/24/21 at 2:20 pm.

## 2021-02-04 NOTE — Telephone Encounter (Signed)
-----   Message from Alver Sorrow, NP sent at 02/03/2021  7:22 PM EST ----- Echocardiogram shows normal heart pumping function.  Pressure in lungs mildly elevated. Mitral valve mild to moderately leaky. This is stable compared to previous.   Please assist to schedule overdue 6 mos f/u with Dr. Kirke Corin or Ward Givens, NP.

## 2021-02-05 ENCOUNTER — Telehealth: Payer: Self-pay

## 2021-02-05 NOTE — Telephone Encounter (Signed)
Patient returning call.

## 2021-02-05 NOTE — Telephone Encounter (Signed)
Called to give the patient echo results. Lmtcb. 

## 2021-02-05 NOTE — Telephone Encounter (Signed)
Patient made aware of echo results with verbalized understanding.  Iran Ouch, MD  02/05/2021 11:21 AM EST     Inform patient that echo showed normal LV systolic function but she does have some changes related to hypertension with abnormal diastolic function and mild to moderate mitral regurgitation.  This can be discussed further with her during her follow-up visit.

## 2021-02-16 ENCOUNTER — Encounter: Payer: Self-pay | Admitting: Family Medicine

## 2021-02-16 ENCOUNTER — Other Ambulatory Visit: Payer: Self-pay

## 2021-02-16 ENCOUNTER — Ambulatory Visit (INDEPENDENT_AMBULATORY_CARE_PROVIDER_SITE_OTHER): Payer: Medicare Other | Admitting: Family Medicine

## 2021-02-16 VITALS — BP 127/70 | HR 67 | Temp 96.6°F | Ht 67.0 in | Wt 206.0 lb

## 2021-02-16 DIAGNOSIS — N1831 Chronic kidney disease, stage 3a: Secondary | ICD-10-CM | POA: Diagnosis not present

## 2021-02-16 DIAGNOSIS — E66811 Obesity, class 1: Secondary | ICD-10-CM

## 2021-02-16 DIAGNOSIS — I2721 Secondary pulmonary arterial hypertension: Secondary | ICD-10-CM

## 2021-02-16 DIAGNOSIS — Z1211 Encounter for screening for malignant neoplasm of colon: Secondary | ICD-10-CM | POA: Diagnosis not present

## 2021-02-16 DIAGNOSIS — Z0000 Encounter for general adult medical examination without abnormal findings: Secondary | ICD-10-CM | POA: Diagnosis not present

## 2021-02-16 DIAGNOSIS — D692 Other nonthrombocytopenic purpura: Secondary | ICD-10-CM | POA: Diagnosis not present

## 2021-02-16 DIAGNOSIS — E669 Obesity, unspecified: Secondary | ICD-10-CM

## 2021-02-16 DIAGNOSIS — E782 Mixed hyperlipidemia: Secondary | ICD-10-CM | POA: Diagnosis not present

## 2021-02-16 DIAGNOSIS — I10 Essential (primary) hypertension: Secondary | ICD-10-CM | POA: Diagnosis not present

## 2021-02-16 DIAGNOSIS — Z6832 Body mass index (BMI) 32.0-32.9, adult: Secondary | ICD-10-CM

## 2021-02-16 DIAGNOSIS — F411 Generalized anxiety disorder: Secondary | ICD-10-CM

## 2021-02-16 NOTE — Progress Notes (Signed)
Complete physical exam   Patient: Whitney Stuart   DOB: [birth year]   71 y.o. Female  MRN: [id number] Visit Date: 02/16/2021  Today's healthcare provider: Lavon Paganini, MD   Chief Complaint  Patient presents with   Annual Exam   Subjective    Whitney Stuart is a 71 y.o. female who presents today for a complete physical exam.  She reports consuming a general diet. Home exercise routine includes walking 3000-6000 steps per day. She generally feels fairly well. She reports sleeping poorly. She does not have additional problems to discuss today.   Has had intermittent dyspnea since husband passed away  Past Medical History:  Diagnosis Date   Arthritis    Asthma    Asthma    Chest pain    a. 11/2019 Cor CTA: Calcium score equals 0.  Normal coronary arteries.   Diastolic dysfunction    a. 11/2019 Echo: EF 60-65%.  Grade 1 diastolic dysfunction.  No regional wall motion abnormalities.  RVSP 47 mmHg.  Moderately dilated left atrium.  Mild to moderate MR.   GERD (gastroesophageal reflux disease)    History of cervical cancer    Hyperlipidemia    Hypertension    Mitral regurgitation    PAH (pulmonary artery hypertension) ([place])    a. 11/2019 Echo: RVSP 88mHg.   Sleep-disordered breathing    Past Surgical History:  Procedure Laterality Date   ABDOMINAL HYSTERECTOMY  1999   total   HAND SURGERY Bilateral 2003   carpal tunnel   LIPOMA EXCISION     Social History   Socioeconomic History   Marital status: Married    Spouse name: ATim Wilhide  Number of children: 7   Years of education: Not on file   Highest education level: GED or equivalent  Occupational History   Occupation: Retired  Tobacco Use   Smoking status: Former    Packs/day: 0.50    Years: 6.00    Pack years: 3.00    Types: Cigarettes    Quit date: 01/24/1997    Years since quitting: 24.0   Smokeless tobacco: Never  Vaping Use   Vaping Use: Never used  Substance and Sexual Activity    Alcohol use: No   Drug use: No   Sexual activity: Yes    Birth control/protection: Surgical  Other Topics Concern   Not on file  Social History Narrative   Not on file   Social Determinants of Health   Financial Resource Strain: Low Risk    Difficulty of Paying Living Expenses: Not hard at all  Food Insecurity: No Food Insecurity   Worried About RCharity fundraiserin the Last Year: Never true   [place] the Last Year: Never true  Transportation Needs: No Transportation Needs   Lack of Transportation (Medical): No   Lack of Transportation (Non-Medical): No  Physical Activity: Insufficiently Active   Days of Exercise per Week: 6 days   Minutes of Exercise per Session: 20 min  Stress: No Stress Concern Present   Feeling of Stress : Only a little  Social Connections: Moderately Isolated   Frequency of Communication with Friends and Family: More than three times a week   Frequency of Social Gatherings with Friends and Family: More than three times a week   Attends Religious Services: More than 4 times per year   Active Member of [employer] Organizations: No   Attends CArchivistMeetings: Never  Marital Status: Widowed  Human resources officer Violence: Not At Risk   Fear of Current or Ex-Partner: No   Emotionally Abused: No   Physically Abused: No   Sexually Abused: No   Family Status  Relation Name Status   Mother  Deceased at age 23       stroke   Father  Deceased at age 22       kidney disease heavy smoker   Sister  Comptroller   Sister  Deceased   Sister  Alive   Brother  Deceased at age 50       lung cancer   Brother  Alive   Brother  Alive   Brother  Alive   Brother  Deceased   Brother  Deceased   Brother  Deceased   Brother  Deceased   Mat Aunt  (Not Specified)   Neg Hx  (Not Specified)   Family History  Problem Relation Age of Onset   Heart disease Mother    Stroke Mother    Congestive Heart Failure Mother    Hypertension Mother     Hypertension Father    Kidney disease Father    Heart murmur Sister    Hypertension Sister    Ovarian cancer Sister        ovarian   Hypertension Sister    Hypertension Sister    Lung cancer Brother    Hypertension Brother    Hypertension Brother    Sleep apnea Brother    Hypertension Brother    Heart disease Brother    Hypertension Brother    Hypertension Brother    Hypertension Brother    Hypertension Brother    Hypertension Brother    Mental illness Maternal Aunt    Colon cancer Neg Hx    Breast cancer Neg Hx    No Known Allergies  Patient Care Team: Jilliana Burkes, Dionne Bucy, MD as PCP - General (Family Medicine) Wellington Hampshire, MD as PCP - Cardiology (Cardiology) Marchia Meiers, MD as Consulting Physician (Ophthalmology)   Medications: Outpatient Medications Prior to Visit  Medication Sig   albuterol (VENTOLIN HFA) 108 (90 Base) MCG/ACT inhaler TAKE 2 PUFFS BY MOUTH EVERY 6 HOURS AS NEEDED FOR WHEEZE OR SHORTNESS OF BREATH   amLODipine (NORVASC) 10 MG tablet Take 1 tablet (10 mg total) by mouth daily.   aspirin 81 MG tablet Take 81 mg by mouth daily.   atorvastatin (LIPITOR) 10 MG tablet Take 1 tablet (10 mg total) by mouth daily.   BREO ELLIPTA 200-25 MCG/ACT AEPB TAKE 1 PUFF BY MOUTH EVERY DAY   Calcium Carb-Cholecalciferol (CALCIUM 600+D3 PO) Take by mouth daily.   clonazePAM (KLONOPIN) 0.5 MG tablet Take 0.5-1 tablets (0.25-0.5 mg total) by mouth 2 (two) times daily as needed for anxiety.   cyclobenzaprine (FLEXERIL) 5 MG tablet TAKE 1 TABLET BY MOUTH AT BEDTIME AS NEEDED FOR MUSCLE SPASMS.   lisinopril (ZESTRIL) 10 MG tablet Take 1 tablet (10 mg total) by mouth daily.   loratadine (CLARITIN) 10 MG tablet Take 10 mg by mouth daily.   ondansetron (ZOFRAN) 4 MG tablet Take 1 tablet (4 mg total) by mouth every 8 (eight) hours as needed for nausea or vomiting.   sertraline (ZOLOFT) 50 MG tablet Take 1 tablet (50 mg total) by mouth every other day.   No  facility-administered medications prior to visit.    Review of Systems  Constitutional: Negative.   HENT: Negative.    Eyes: Negative.   Respiratory:  Positive for  shortness of breath. Negative for chest tightness.   Gastrointestinal: Negative.   Endocrine: Negative.   Genitourinary: Negative.   Musculoskeletal:  Positive for arthralgias.  Skin: Negative.   Neurological: Negative.   Hematological: Negative.   Psychiatric/Behavioral: Negative.    All other systems reviewed and are negative.    Objective    BP 127/70 (BP Location: Right Arm, Patient Position: Sitting, Cuff Size: Large)    Pulse 67    Temp (!) 96.6 F (35.9 C) (Temporal)    Ht 5' 7"  (1.702 m)    Wt 206 lb (93.4 kg)    SpO2 100%    BMI 32.26 kg/m    Physical Exam Vitals reviewed.  Constitutional:      General: She is not in acute distress.    Appearance: Normal appearance. She is not ill-appearing or toxic-appearing.  HENT:     Head: Normocephalic and atraumatic.     Right Ear: External ear normal.     Left Ear: External ear normal.     Nose: Nose normal.     Mouth/Throat:     Mouth: Mucous membranes are moist.     Pharynx: Oropharynx is clear. No oropharyngeal exudate or posterior oropharyngeal erythema.  Eyes:     General: No scleral icterus.    Extraocular Movements: Extraocular movements intact.     Conjunctiva/sclera: Conjunctivae normal.     Pupils: Pupils are equal, round, and reactive to light.  Cardiovascular:     Rate and Rhythm: Normal rate and regular rhythm.     Pulses: Normal pulses.     Heart sounds: Normal heart sounds. No murmur heard.   No friction rub. No gallop.  Pulmonary:     Effort: Pulmonary effort is normal. No respiratory distress.     Breath sounds: Normal breath sounds. No wheezing or rhonchi.  Chest:     Chest wall: No tenderness.  Abdominal:     General: Abdomen is flat. There is no distension.     Palpations: Abdomen is soft.     Tenderness: There is no abdominal  tenderness.  Musculoskeletal:        General: Normal range of motion.     Cervical back: Normal range of motion and neck supple.     Right lower leg: No edema.     Left lower leg: No edema.  Skin:    General: Skin is warm and dry.     Capillary Refill: Capillary refill takes less than 2 seconds.     Findings: No lesion or rash.  Neurological:     General: No focal deficit present.     Mental Status: She is alert and oriented to person, place, and time. Mental status is at baseline.  Psychiatric:        Mood and Affect: Mood normal.        Behavior: Behavior normal.     Last depression screening scores PHQ 2/9 Scores 02/16/2021 07/17/2020 10/25/2019  PHQ - 2 Score 0 0 1  PHQ- 9 Score 4 5 4    Last fall risk screening Fall Risk  02/16/2021  Falls in the past year? 0  Number falls in past yr: 0  Injury with Fall? 0  Risk for fall due to : No Fall Risks  Follow up Falls evaluation completed   Last Audit-C alcohol use screening Alcohol Use Disorder Test (AUDIT) 02/16/2021  1. How often do you have a drink containing alcohol? 0  2. How many drinks containing alcohol do you have  on a typical day when you are drinking? 0  3. How often do you have six or more drinks on one occasion? 0  AUDIT-C Score 0  Alcohol Brief Interventions/Follow-up -   A score of 3 or more in women, and 4 or more in men indicates increased risk for alcohol abuse, EXCEPT if all of the points are from question 1   Results for orders placed or performed in visit on 02/16/21  Comprehensive metabolic panel  Result Value Ref Range   Glucose 90 70 - 99 mg/dL   BUN 17 8 - 27 mg/dL   Creatinine, Ser 1.05 (H) 0.57 - 1.00 mg/dL   eGFR 58 (L) >59 mL/min/1.73   BUN/Creatinine Ratio 16 12 - 28   Sodium 142 134 - 144 mmol/L   Potassium 5.1 3.5 - 5.2 mmol/L   Chloride 108 (H) 96 - 106 mmol/L   CO2 23 20 - 29 mmol/L   Calcium 9.9 8.7 - 10.3 mg/dL   Total Protein 7.6 6.0 - 8.5 g/dL   Albumin 4.7 3.8 - 4.8 g/dL    Globulin, Total 2.9 1.5 - 4.5 g/dL   Albumin/Globulin Ratio 1.6 1.2 - 2.2   Bilirubin Total 0.3 0.0 - 1.2 mg/dL   Alkaline Phosphatase 72 44 - 121 IU/L   AST 9 0 - 40 IU/L   ALT 13 0 - 32 IU/L  Lipid panel  Result Value Ref Range   Cholesterol, Total 150 100 - 199 mg/dL   Triglycerides 75 0 - 149 mg/dL   HDL 59 >39 mg/dL   VLDL Cholesterol Cal 15 5 - 40 mg/dL   LDL Chol Calc (NIH) 76 0 - 99 mg/dL   Chol/HDL Ratio 2.5 0.0 - 4.4 ratio    Assessment & Plan    Routine Health Maintenance and Physical Exam  Exercise Activities and Dietary recommendations  Goals      Exercise 150 minutes per week (moderate activity)     Lose weight, eat healthy & exerise        Immunization History  Administered Date(s) Administered   Fluad Quad(high Dose 65+) 10/25/2019   Influenza, High Dose Seasonal PF 01/12/2017, 03/07/2018   Pneumococcal Conjugate-13 01/12/2017   Pneumococcal Polysaccharide-23 03/07/2018   Td 08/23/1995, 06/01/2019    Health Maintenance  Topic Date Due   COVID-19 Vaccine (1) Never done   Zoster Vaccines- Shingrix (1 of 2) Never done   Fecal DNA (Cologuard)  02/08/2020   INFLUENZA VACCINE  04/24/2021 (Originally 08/25/2020)   DEXA SCAN  02/22/2022   MAMMOGRAM  07/19/2022   TETANUS/TDAP  05/31/2029   Pneumonia Vaccine 31+ Years old  Completed   Hepatitis C Screening  Completed   HPV VACCINES  Aged Out    Discussed health benefits of physical activity, and encouraged her to engage in regular exercise appropriate for her age and condition.  Problem List Items Addressed This Visit       Cardiovascular and Mediastinum   Essential (primary) hypertension    Well controlled Continue current medications Recheck metabolic panel F/u in 6 months      Relevant Orders   Comprehensive metabolic panel (Completed)   Senile purpura ([place])    Chronic, stable      PAH (pulmonary artery hypertension) ([place])    Had Echo on 02/03/21 Followed by cardiology         Genitourinary   Chronic kidney disease, stage 3a ([place])    Stable, reviewed last metabolic panel Recheck metabolic panel  Relevant Orders   Comprehensive metabolic panel (Completed)     Other   Class 1 obesity with serious comorbidity and body mass index (BMI) of 32.0 to 32.9 in adult    Discussed importance of healthy weight management Discussed diet and exercise      Hyperlipidemia    Well controlled, review last lipid panel Continue atorvastatin 70m daily Recheck lipid panel      Relevant Orders   Lipid panel (Completed)   GAD (generalized anxiety disorder)    Chronic, stable, well-controlled Continue Zoloft 511mdaily Encouraged therapy      Other Visit Diagnoses     Encounter for annual physical exam    -  Primary   Screening for colon cancer       Relevant Orders   Cologuard         Return in about 6 months (around 08/16/2021) for chronic disease f/u.     ElNelva NayMedical Student 02/17/2021, 2:00 PM   Patient seen along with MS3 student ElNelva NayI personally evaluated this patient along with the student, and verified all aspects of the history, physical exam, and medical decision making as documented by the student. I agree with the student's documentation and have made all necessary edits.  Kaitelyn Jamison, AnDionne BucyMD, MPH [facility]

## 2021-02-16 NOTE — Assessment & Plan Note (Signed)
Discussed importance of healthy weight management Discussed diet and exercise  

## 2021-02-16 NOTE — Assessment & Plan Note (Signed)
Well controlled, review last lipid panel Continue atorvastatin 10mg  daily Recheck lipid panel

## 2021-02-16 NOTE — Assessment & Plan Note (Signed)
Well controlled Continue current medications Recheck metabolic panel F/u in 6 months  

## 2021-02-16 NOTE — Assessment & Plan Note (Signed)
Had Echo on 02/03/21 Followed by cardiology

## 2021-02-16 NOTE — Patient Instructions (Signed)
   The CDC recommends two doses of Shingrix (the shingles vaccine) separated by 2 to 6 months for adults age 70 years and older. I recommend checking with your insurance plan regarding coverage for this vaccine.   

## 2021-02-16 NOTE — Assessment & Plan Note (Signed)
Stable, reviewed last metabolic panel Recheck metabolic panel

## 2021-02-16 NOTE — Assessment & Plan Note (Signed)
Chronic, stable, well-controlled Continue Zoloft 50mg  daily Encouraged therapy

## 2021-02-16 NOTE — Assessment & Plan Note (Signed)
Chronic, stable 

## 2021-02-17 LAB — COMPREHENSIVE METABOLIC PANEL
ALT: 13 IU/L (ref 0–32)
AST: 9 IU/L (ref 0–40)
Albumin/Globulin Ratio: 1.6 (ref 1.2–2.2)
Albumin: 4.7 g/dL (ref 3.8–4.8)
Alkaline Phosphatase: 72 IU/L (ref 44–121)
BUN/Creatinine Ratio: 16 (ref 12–28)
BUN: 17 mg/dL (ref 8–27)
Bilirubin Total: 0.3 mg/dL (ref 0.0–1.2)
CO2: 23 mmol/L (ref 20–29)
Calcium: 9.9 mg/dL (ref 8.7–10.3)
Chloride: 108 mmol/L — ABNORMAL HIGH (ref 96–106)
Creatinine, Ser: 1.05 mg/dL — ABNORMAL HIGH (ref 0.57–1.00)
Globulin, Total: 2.9 g/dL (ref 1.5–4.5)
Glucose: 90 mg/dL (ref 70–99)
Potassium: 5.1 mmol/L (ref 3.5–5.2)
Sodium: 142 mmol/L (ref 134–144)
Total Protein: 7.6 g/dL (ref 6.0–8.5)
eGFR: 58 mL/min/{1.73_m2} — ABNORMAL LOW (ref >59)

## 2021-02-17 LAB — LIPID PANEL
Chol/HDL Ratio: 2.5 ratio (ref 0.0–4.4)
Cholesterol, Total: 150 mg/dL (ref 100–199)
HDL: 59 mg/dL (ref >39)
LDL Chol Calc (NIH): 76 mg/dL (ref 0–99)
Triglycerides: 75 mg/dL (ref 0–149)
VLDL Cholesterol Cal: 15 mg/dL (ref 5–40)

## 2021-02-24 ENCOUNTER — Other Ambulatory Visit: Payer: Self-pay

## 2021-02-24 ENCOUNTER — Encounter: Payer: Self-pay | Admitting: Cardiovascular Disease

## 2021-02-24 ENCOUNTER — Ambulatory Visit (INDEPENDENT_AMBULATORY_CARE_PROVIDER_SITE_OTHER): Payer: Medicare Other | Admitting: Cardiovascular Disease

## 2021-02-24 VITALS — BP 144/70 | HR 64 | Ht 67.0 in | Wt 207.5 lb

## 2021-02-24 DIAGNOSIS — R0609 Other forms of dyspnea: Secondary | ICD-10-CM | POA: Diagnosis not present

## 2021-02-24 DIAGNOSIS — I10 Essential (primary) hypertension: Secondary | ICD-10-CM | POA: Diagnosis not present

## 2021-02-24 DIAGNOSIS — I340 Nonrheumatic mitral (valve) insufficiency: Secondary | ICD-10-CM

## 2021-02-24 DIAGNOSIS — E785 Hyperlipidemia, unspecified: Secondary | ICD-10-CM

## 2021-02-24 NOTE — Patient Instructions (Signed)

## 2021-02-24 NOTE — Progress Notes (Signed)
Cardiology Office Note   Date:  02/24/2021   ID:  Whitney LateCynthia D Auletta, DOB [birth year], MRN [id number]  PCP:  Erasmo DownerBacigalupo, Angela M, MD  Cardiologist:   Lorine BearsMuhammad Zaeden Lastinger, MD   Chief Complaint  Patient presents with   Other    OD 6 month f/u c/o sob. Meds reviewed verbally with pt.      History of Present Illness: Whitney Stuart is a 71 y.o. female who is here today for follow-up visit regarding dyspnea on exertion.  She has known history of essential hypertension, hyperlipidemia and secondhand smoking.  She is a lifelong non-smoker but has been exposed to secondhand smoking from her husband who smoked for many years.  She was evaluated in October 2021 for chest pain and shortness of breath.  Echocardiogram showed normal LV systolic function, grade 1 diastolic dysfunction, mild to moderate mitral regurgitation and mild to moderate pulmonary hypertension with peak systolic pressure of 47 mmHg.  Cardiac CTA showed a calcium score of 0 with normal coronary arteries. Home sleep study in June 2022 was normal. She has been doing reasonably well and reports stable exertional dyspnea with no chest pain. She had a repeat echocardiogram few weeks ago which showed normal LV systolic function, grade 1 diastolic dysfunction.  Mild pulmonary hypertension and mild to moderate mitral regurgitation.   Past Medical History:  Diagnosis Date   Arthritis    Asthma    Asthma    Chest pain    a. 11/2019 Cor CTA: Calcium score equals 0.  Normal coronary arteries.   Diastolic dysfunction    a. 11/2019 Echo: EF 60-65%.  Grade 1 diastolic dysfunction.  No regional wall motion abnormalities.  RVSP 47 mmHg.  Moderately dilated left atrium.  Mild to moderate MR.   GERD (gastroesophageal reflux disease)    History of cervical cancer    Hyperlipidemia    Hypertension    Mitral regurgitation    PAH (pulmonary artery hypertension) ([facility])    a. 11/2019 Echo: RVSP 47mmHg.   Sleep-disordered breathing     Past  Surgical History:  Procedure Laterality Date   ABDOMINAL HYSTERECTOMY  1999   total   HAND SURGERY Bilateral 2003   carpal tunnel   LIPOMA EXCISION       Current Outpatient Medications  Medication Sig Dispense Refill   albuterol (VENTOLIN HFA) 108 (90 Base) MCG/ACT inhaler TAKE 2 PUFFS BY MOUTH EVERY 6 HOURS AS NEEDED FOR WHEEZE OR SHORTNESS OF BREATH 8.5 each 3   amLODipine (NORVASC) 10 MG tablet Take 1 tablet (10 mg total) by mouth daily. 90 tablet 3   aspirin 81 MG tablet Take 81 mg by mouth daily.     atorvastatin (LIPITOR) 10 MG tablet Take 1 tablet (10 mg total) by mouth daily. 90 tablet 3   BREO ELLIPTA 200-25 MCG/ACT AEPB TAKE 1 PUFF BY MOUTH EVERY DAY 180 each 3   Calcium Carb-Cholecalciferol (CALCIUM 600+D3 PO) Take by mouth daily.     clonazePAM (KLONOPIN) 0.5 MG tablet Take 0.5-1 tablets (0.25-0.5 mg total) by mouth 2 (two) times daily as needed for anxiety. 15 tablet 0   cyclobenzaprine (FLEXERIL) 5 MG tablet TAKE 1 TABLET BY MOUTH AT BEDTIME AS NEEDED FOR MUSCLE SPASMS. 30 tablet 1   lisinopril (ZESTRIL) 10 MG tablet Take 1 tablet (10 mg total) by mouth daily. 90 tablet 3   loratadine (CLARITIN) 10 MG tablet Take 10 mg by mouth daily.     ondansetron (ZOFRAN) 4 MG tablet Take  1 tablet (4 mg total) by mouth every 8 (eight) hours as needed for nausea or vomiting. 20 tablet 0   sertraline (ZOLOFT) 50 MG tablet Take 1 tablet (50 mg total) by mouth every other day. 45 tablet 2   No current facility-administered medications for this visit.    Allergies:   Patient has no known allergies.    Social History:  The patient  reports that she quit smoking about 24 years ago. Her smoking use included cigarettes. She has a 3.00 pack-year smoking history. She has never used smokeless tobacco. She reports that she does not drink alcohol and does not use drugs.   Family History:  The patient's family history includes Congestive Heart Failure in her mother; Heart disease in her brother  and mother; Heart murmur in her sister; Hypertension in her brother, brother, brother, brother, brother, brother, brother, brother, father, mother, sister, sister, and sister; Kidney disease in her father; Lung cancer in her brother; Mental illness in her maternal aunt; Ovarian cancer in her sister; Sleep apnea in her brother; Stroke in her mother.    ROS:  Please see the history of present illness.   Otherwise, review of systems are positive for none.   All other systems are reviewed and negative.    PHYSICAL EXAM: VS:  BP (!) 144/70 (BP Location: Left Arm, Patient Position: Sitting, Cuff Size: Normal)    Pulse 64    Ht 5\' 7"  (1.702 m)    Wt 207 lb 8 oz (94.1 kg)    SpO2 98%    BMI 32.50 kg/m  , BMI Body mass index is 32.5 kg/m. GEN: Well nourished, well developed, in no acute distress  HEENT: normal  Neck: no JVD, carotid bruits, or masses Cardiac: RRR; no  rubs, or gallops.  1/ 6 systolic murmur in the aortic area.  Trace bilateral leg edema. Respiratory:  clear to auscultation bilaterally, normal work of breathing GI: soft, nontender, nondistended, + BS MS: no deformity or atrophy  Skin: warm and dry, no rash Neuro:  Strength and sensation are intact Psych: euthymic mood, full affect   EKG:  EKG is ordered today. The ekg ordered today demonstrates normal sinus rhythm with no significant ST or T wave changes.   Recent Labs: 02/16/2021: ALT 13; BUN 17; Creatinine, Ser 1.05; Potassium 5.1; Sodium 142    Lipid Panel    Component Value Date/Time   CHOL 150 02/16/2021 1517   TRIG 75 02/16/2021 1517   HDL 59 02/16/2021 1517   CHOLHDL 2.5 02/16/2021 1517   LDLCALC 76 02/16/2021 1517      Wt Readings from Last 3 Encounters:  02/24/21 207 lb 8 oz (94.1 kg)  02/16/21 206 lb (93.4 kg)  12/08/20 207 lb 12.8 oz (94.3 kg)       PAD Screen 11/22/2019  Previous PAD dx? No  Previous surgical procedure? No  Pain with walking? No  Feet/toe relief with dangling? No  Painful,  non-healing ulcers? No  Extremities discolored? No      ASSESSMENT AND PLAN:  1.  Exertional dyspnea : Suspect that her symptoms are multifactorial.  Cardiac testing so far has been mostly nonrevealing.  Symptoms are overall stable.  No evidence of volume overload.  2.  Essential hypertension: Blood pressure is reasonably controlled on current medications.  3.  Hyperlipidemia: Currently on small dose atorvastatin 10 mg daily.    4.  Mild to moderate mitral regurgitation: I do not hear mitral regurgitation murmur by exam today.  Recommend a follow-up echocardiogram in 2 years from now.    Disposition:   FU with me in 12 month  Signed,  Lorine Bears, MD  02/24/2021 2:31 PM    [facility]

## 2021-03-03 DIAGNOSIS — Z1211 Encounter for screening for malignant neoplasm of colon: Secondary | ICD-10-CM | POA: Diagnosis not present

## 2021-03-10 LAB — COLOGUARD: COLOGUARD: NEGATIVE

## 2021-03-16 ENCOUNTER — Telehealth: Payer: Self-pay | Admitting: Family Medicine

## 2021-03-16 DIAGNOSIS — E782 Mixed hyperlipidemia: Secondary | ICD-10-CM

## 2021-03-16 NOTE — Telephone Encounter (Signed)
Ok to switch to [employer] 5mg  daily

## 2021-03-16 NOTE — Telephone Encounter (Signed)
Pt is requesting new medication to replace atorvastatin (LIPITOR) 10 MG tablet. Pt stated she does not like the side affects of the medication one being memory. Pt stated she has been forgetting things and she has been on this medication for a long time.   Please advise.

## 2021-03-17 MED ORDER — ROSUVASTATIN CALCIUM 5 MG PO TABS: 5.0000 mg | ORAL_TABLET | Freq: Every day | ORAL | 1 refills | Status: DC

## 2021-03-17 NOTE — Telephone Encounter (Signed)
Patient advised.

## 2021-06-21 ENCOUNTER — Other Ambulatory Visit: Payer: Self-pay | Admitting: Family Medicine

## 2021-07-12 ENCOUNTER — Other Ambulatory Visit: Payer: Self-pay | Admitting: Family Medicine

## 2021-07-12 DIAGNOSIS — I10 Essential (primary) hypertension: Secondary | ICD-10-CM

## 2021-08-17 ENCOUNTER — Encounter: Payer: Self-pay | Admitting: Family Medicine

## 2021-08-17 ENCOUNTER — Ambulatory Visit (INDEPENDENT_AMBULATORY_CARE_PROVIDER_SITE_OTHER): Payer: Medicare Other | Admitting: Family Medicine

## 2021-08-17 ENCOUNTER — Ambulatory Visit: Payer: Medicare Other | Admitting: Family Medicine

## 2021-08-17 VITALS — BP 122/67 | HR 67 | Temp 97.8°F | Resp 16 | Ht 67.0 in | Wt 207.7 lb

## 2021-08-17 DIAGNOSIS — N189 Chronic kidney disease, unspecified: Secondary | ICD-10-CM

## 2021-08-17 DIAGNOSIS — I10 Essential (primary) hypertension: Secondary | ICD-10-CM

## 2021-08-17 DIAGNOSIS — J4520 Mild intermittent asthma, uncomplicated: Secondary | ICD-10-CM | POA: Diagnosis not present

## 2021-08-17 DIAGNOSIS — N1831 Chronic kidney disease, stage 3a: Secondary | ICD-10-CM | POA: Diagnosis not present

## 2021-08-17 DIAGNOSIS — R5382 Chronic fatigue, unspecified: Secondary | ICD-10-CM | POA: Diagnosis not present

## 2021-08-17 DIAGNOSIS — I2721 Secondary pulmonary arterial hypertension: Secondary | ICD-10-CM

## 2021-08-17 DIAGNOSIS — E782 Mixed hyperlipidemia: Secondary | ICD-10-CM | POA: Diagnosis not present

## 2021-08-17 DIAGNOSIS — R0609 Other forms of dyspnea: Secondary | ICD-10-CM | POA: Diagnosis not present

## 2021-08-17 MED ORDER — LISINOPRIL 10 MG PO TABS: 10.0000 mg | ORAL_TABLET | Freq: Every day | ORAL | 1 refills | Status: DC

## 2021-08-17 MED ORDER — ALBUTEROL SULFATE HFA 108 (90 BASE) MCG/ACT IN AERS: 2.0000 | INHALATION_SPRAY | Freq: Four times a day (QID) | RESPIRATORY_TRACT | 3 refills | Status: DC | PRN

## 2021-08-17 MED ORDER — AMLODIPINE BESYLATE 10 MG PO TABS: 10.0000 mg | ORAL_TABLET | Freq: Every day | ORAL | 1 refills | Status: DC

## 2021-08-17 MED ORDER — CYCLOBENZAPRINE HCL 5 MG PO TABS
5.0000 mg | ORAL_TABLET | Freq: Every day | ORAL | 1 refills | Status: DC
Start: 2021-08-17 — End: 2022-02-10

## 2021-08-17 MED ORDER — CLONAZEPAM 0.5 MG PO TABS: 0.2500 mg | ORAL_TABLET | Freq: Two times a day (BID) | ORAL | 0 refills | Status: DC | PRN

## 2021-08-17 MED ORDER — ROSUVASTATIN CALCIUM 5 MG PO TABS: ORAL_TABLET | ORAL | 1 refills | Status: DC

## 2021-08-17 NOTE — Progress Notes (Unsigned)
Established patient visit  I,Joseline E Rosas,acting as a scribe for Gwyneth Sprout, FNP.,have documented all relevant documentation on the behalf of Gwyneth Sprout, FNP,as directed by  Gwyneth Sprout, FNP while in the presence of Gwyneth Sprout, FNP.   Patient: Whitney Stuart   DOB: Jan 02, 1951   71 y.o. Female  MRN: [id number] Visit Date: 08/17/2021  Today's healthcare provider: Gwyneth Sprout, FNP  Introduced to nurse practitioner role and practice setting.  All questions answered.  Discussed provider/patient relationship and expectations.   Chief Complaint  Patient presents with   follow-up HTN   Subjective    HPI  Hypertension, follow-up  BP Readings from Last 3 Encounters:  08/17/21 122/67  02/24/21 (!) 144/70  02/16/21 127/70   Wt Readings from Last 3 Encounters:  08/17/21 207 lb 11.2 oz (94.2 kg)  02/24/21 207 lb 8 oz (94.1 kg)  02/16/21 206 lb (93.4 kg)     She was last seen for hypertension 6 months ago.  BP at that visit was 127/70. Management since that visit includes Continue current medications.  She reports excellent compliance with treatment. She is not having side effects.  She is following a Regular diet. She is exercising.Yard work. She does not smoke.   Outside blood pressures are not being checked regularly. Symptoms: No chest pain No chest pressure  No palpitations No syncope  No dyspnea No orthopnea  No paroxysmal nocturnal dyspnea No lower extremity edema   Pertinent labs Lab Results  Component Value Date   CHOL 160 08/17/2021   HDL 60 08/17/2021   LDLCALC 86 08/17/2021   TRIG 72 08/17/2021   CHOLHDL 2.7 08/17/2021   Lab Results  Component Value Date   NA 141 08/17/2021   K 5.1 08/17/2021   CREATININE 1.45 (H) 08/17/2021   EGFR 39 (L) 08/17/2021   GLUCOSE 99 08/17/2021   TSH 0.891 08/17/2021     The 10-year ASCVD risk score (Arnett DK, et al., 2019) is:  10.6%  ---------------------------------------------------------------------------------------------------  Follow up for GAD and Depression  The patient was last seen for this 6 months ago. Changes made at last visit include Continue current medication. Encouraged therapy.  She reports excellent compliance with treatment. She feels that condition is Improved. She is not having side effects.     08/17/2021    1:06 PM 10/25/2019   10:54 AM 09/18/2019    9:13 AM  GAD 7 : Generalized Anxiety Score  Nervous, Anxious, on Edge 0 0 3  Control/stop worrying 0 0 3  Worry too much - different things 0 0 2  Trouble relaxing _0 Restless 0 0 2  Easily annoyed or irritable 0 0 0  Afraid - awful might happen 0 0 0  Total GAD 7 Score _1 Anxiety Difficulty Not difficult at all Not difficult at all Very difficult        08/17/2021    1:07 PM  Depression screen PHQ 2/9  Decreased Interest 2  Down, Depressed, Hopeless 0  PHQ - 2 Score 2  Altered sleeping 3  Tired, decreased energy 3  Feeling bad or failure about yourself  0  Trouble concentrating 0  Moving slowly or fidgety/restless 0  Suicidal thoughts 0  PHQ-9 Score 8  Difficult doing work/chores Not difficult at all    -----------------------------------------------------------------------------------------   Medications: Outpatient Medications Prior to Visit  Medication Sig   aspirin 81 MG tablet Take 81 mg  by mouth daily.   BREO ELLIPTA 200-25 MCG/ACT AEPB TAKE 1 PUFF BY MOUTH EVERY DAY   Calcium Carb-Cholecalciferol (CALCIUM 600+D3 PO) Take by mouth daily.   loratadine (CLARITIN) 10 MG tablet Take 10 mg by mouth daily.   ondansetron (ZOFRAN) 4 MG tablet Take 1 tablet (4 mg total) by mouth every 8 (eight) hours as needed for nausea or vomiting.   sertraline (ZOLOFT) 50 MG tablet TAKE 1 TABLET (50 MG TOTAL) BY MOUTH EVERY OTHER DAY.   [DISCONTINUED] albuterol (VENTOLIN HFA) 108 (90 Base) MCG/ACT inhaler TAKE 2 PUFFS BY  MOUTH EVERY 6 HOURS AS NEEDED FOR WHEEZE OR SHORTNESS OF BREATH   [DISCONTINUED] amLODipine (NORVASC) 10 MG tablet TAKE 1 TABLET BY MOUTH EVERY DAY   [DISCONTINUED] clonazePAM (KLONOPIN) 0.5 MG tablet Take 0.5-1 tablets (0.25-0.5 mg total) by mouth 2 (two) times daily as needed for anxiety.   [DISCONTINUED] cyclobenzaprine (FLEXERIL) 5 MG tablet TAKE 1 TABLET BY MOUTH AT BEDTIME AS NEEDED FOR MUSCLE SPASMS.   [DISCONTINUED] lisinopril (ZESTRIL) 10 MG tablet TAKE 1 TABLET BY MOUTH EVERY DAY   [DISCONTINUED] rosuvastatin (CRESTOR) 5 MG tablet Take 1 tablet (5 mg total) by mouth daily.   No facility-administered medications prior to visit.    Review of Systems  Last CBC Lab Results  Component Value Date   WBC 8.1 08/17/2021   HGB 13.5 08/17/2021   HCT 38.9 08/17/2021   MCV 93 08/17/2021   MCH 32.1 08/17/2021   RDW 12.4 08/17/2021   PLT 372 62/95/2841   Last metabolic panel Lab Results  Component Value Date   GLUCOSE 99 08/17/2021   NA 141 08/17/2021   K 5.1 08/17/2021   CL 104 08/17/2021   CO2 20 08/17/2021   BUN 20 08/17/2021   CREATININE 1.45 (H) 08/17/2021   EGFR 39 (L) 08/17/2021   CALCIUM 10.6 (H) 08/17/2021   PROT 7.8 08/17/2021   ALBUMIN 4.5 08/17/2021   LABGLOB 3.3 08/17/2021   AGRATIO 1.4 08/17/2021   BILITOT 0.5 08/17/2021   ALKPHOS 74 08/17/2021   AST 9 08/17/2021   ALT 15 08/17/2021   ANIONGAP 11 11/29/2019   Last lipids Lab Results  Component Value Date   CHOL 160 08/17/2021   HDL 60 08/17/2021   LDLCALC 86 08/17/2021   TRIG 72 08/17/2021   CHOLHDL 2.7 08/17/2021   Last thyroid functions Lab Results  Component Value Date   TSH 0.891 08/17/2021   Last vitamin D Lab Results  Component Value Date   VD25OH 42.2 08/17/2021   Last vitamin B12 and Folate Lab Results  Component Value Date   VITAMINB12 1,319 (H) 08/17/2021   FOLATE >20.0 08/17/2021       Objective    BP 122/67 (BP Location: Right Arm, Patient Position: Sitting, Cuff Size:  Normal)   Pulse 67   Temp 97.8 F (36.6 C) (Oral)   Resp 16   Ht 5' 7" (1.702 m)   Wt 207 lb 11.2 oz (94.2 kg)   BMI 32.53 kg/m  BP Readings from Last 3 Encounters:  08/17/21 122/67  02/24/21 (!) 144/70  02/16/21 127/70   Wt Readings from Last 3 Encounters:  08/17/21 207 lb 11.2 oz (94.2 kg)  02/24/21 207 lb 8 oz (94.1 kg)  02/16/21 206 lb (93.4 kg)   SpO2 Readings from Last 3 Encounters:  02/24/21 98%  02/16/21 100%  07/17/20 98%      Physical Exam Vitals and nursing note reviewed.  Constitutional:      General:  She is not in acute distress.    Appearance: Normal appearance. She is obese. She is not ill-appearing, toxic-appearing or diaphoretic.  HENT:     Head: Normocephalic and atraumatic.  Cardiovascular:     Rate and Rhythm: Normal rate and regular rhythm.     Pulses: Normal pulses.     Heart sounds: Normal heart sounds. No murmur heard.    No friction rub. No gallop.  Pulmonary:     Effort: Pulmonary effort is normal. No respiratory distress.     Breath sounds: Normal breath sounds. No stridor. No wheezing, rhonchi or rales.  Chest:     Chest wall: No tenderness.  Abdominal:     General: Bowel sounds are normal.     Palpations: Abdomen is soft.  Musculoskeletal:        General: No swelling, tenderness, deformity or signs of injury. Normal range of motion.     Right lower leg: No edema.     Left lower leg: No edema.  Skin:    General: Skin is warm and dry.     Capillary Refill: Capillary refill takes less than 2 seconds.     Coloration: Skin is not jaundiced or pale.     Findings: No bruising, erythema, lesion or rash.  Neurological:     General: No focal deficit present.     Mental Status: She is alert and oriented to person, place, and time. Mental status is at baseline.     Cranial Nerves: No cranial nerve deficit.     Sensory: No sensory deficit.     Motor: No weakness.     Coordination: Coordination normal.  Psychiatric:        Mood and Affect:  Mood normal.        Behavior: Behavior normal.        Thought Content: Thought content normal.        Judgment: Judgment normal.      Results for orders placed or performed in visit on 08/17/21  Comprehensive metabolic panel  Result Value Ref Range   Glucose 99 70 - 99 mg/dL   BUN 20 8 - 27 mg/dL   Creatinine, Ser 1.45 (H) 0.57 - 1.00 mg/dL   eGFR 39 (L) >59 mL/min/1.73   BUN/Creatinine Ratio 14 12 - 28   Sodium 141 134 - 144 mmol/L   Potassium 5.1 3.5 - 5.2 mmol/L   Chloride 104 96 - 106 mmol/L   CO2 20 20 - 29 mmol/L   Calcium 10.6 (H) 8.7 - 10.3 mg/dL   Total Protein 7.8 6.0 - 8.5 g/dL   Albumin 4.5 3.9 - 4.9 g/dL   Globulin, Total 3.3 1.5 - 4.5 g/dL   Albumin/Globulin Ratio 1.4 1.2 - 2.2   Bilirubin Total 0.5 0.0 - 1.2 mg/dL   Alkaline Phosphatase 74 44 - 121 IU/L   AST 9 0 - 40 IU/L   ALT 15 0 - 32 IU/L  Lipid panel  Result Value Ref Range   Cholesterol, Total 160 100 - 199 mg/dL   Triglycerides 72 0 - 149 mg/dL   HDL 60 >39 mg/dL   VLDL Cholesterol Cal 14 5 - 40 mg/dL   LDL Chol Calc (NIH) 86 0 - 99 mg/dL   Chol/HDL Ratio 2.7 0.0 - 4.4 ratio  TSH + free T4  Result Value Ref Range   TSH 0.891 0.450 - 4.500 uIU/mL   Free T4 1.06 0.82 - 1.77 ng/dL  CBC with Differential/Platelet  Result Value Ref Range  WBC 8.1 3.4 - 10.8 x10E3/uL   RBC 4.20 3.77 - 5.28 x10E6/uL   Hemoglobin 13.5 11.1 - 15.9 g/dL   Hematocrit 38.9 34.0 - 46.6 %   MCV 93 79 - 97 fL   MCH 32.1 26.6 - 33.0 pg   MCHC 34.7 31.5 - 35.7 g/dL   RDW 12.4 11.7 - 15.4 %   Platelets 372 150 - 450 x10E3/uL   Neutrophils 51 Not Estab. %   Lymphs 37 Not Estab. %   Monocytes 9 Not Estab. %   Eos 2 Not Estab. %   Basos 1 Not Estab. %   Neutrophils Absolute 4.1 1.4 - 7.0 x10E3/uL   Lymphocytes Absolute 3.0 0.7 - 3.1 x10E3/uL   Monocytes Absolute 0.7 0.1 - 0.9 x10E3/uL   EOS (ABSOLUTE) 0.1 0.0 - 0.4 x10E3/uL   Basophils Absolute 0.1 0.0 - 0.2 x10E3/uL   Immature Granulocytes 0 Not Estab. %   Immature  Grans (Abs) 0.0 0.0 - 0.1 x10E3/uL  Vitamin D (25 hydroxy)  Result Value Ref Range   Vit D, 25-Hydroxy 42.2 30.0 - 100.0 ng/mL  B12 and Folate Panel  Result Value Ref Range   Vitamin B-12 1,319 (H) 232 - 1,245 pg/mL   Folate >20.0 >3.0 ng/mL  Iron, TIBC and Ferritin Panel  Result Value Ref Range   Total Iron Binding Capacity 323 250 - 450 ug/dL   UIBC 241 118 - 369 ug/dL   Iron 82 27 - 139 ug/dL   Iron Saturation 25 15 - 55 %   Ferritin 130 15 - 150 ng/mL    Assessment & Plan     Problem List Items Addressed This Visit       Cardiovascular and Mediastinum   Essential (primary) hypertension - Primary    Chronic, stable Continue norvasc 10, lisinopril 10      Relevant Medications   amLODipine (NORVASC) 10 MG tablet   lisinopril (ZESTRIL) 10 MG tablet   rosuvastatin (CRESTOR) 5 MG tablet   Other Relevant Orders   Comprehensive metabolic panel (Completed)   Ambulatory referral to Nephrology   PAH (pulmonary artery hypertension) (HCC)    Chronic, presumed to worsened given complaints of fatigue and DOE Encourage f/u with cardiology Will check labs for external sources       Relevant Medications   amLODipine (NORVASC) 10 MG tablet   lisinopril (ZESTRIL) 10 MG tablet   rosuvastatin (CRESTOR) 5 MG tablet     Respiratory   Asthma, exogenous    Chronic, complaints of DOE LCTAB Encourage f/u with pulm for repeat PFTS Request for PRN inhaler refill Continue breo daily 1 puff      Relevant Medications   albuterol (VENTOLIN HFA) 108 (90 Base) MCG/ACT inhaler     Genitourinary   Chronic kidney disease    Acute, new diagnosis encourage f/u with nephrology      Relevant Orders   Ambulatory referral to Nephrology     Other   Chronic fatigue    Chronic, unknown cause Encourage f/u with pulm and cards Labs negative for cause/effect outside of elevated creatinine- follow up placed for referral with nephrology       Relevant Orders   Comprehensive metabolic  panel (Completed)   Lipid panel (Completed)   TSH + free T4 (Completed)   CBC with Differential/Platelet (Completed)   Vitamin D (25 hydroxy) (Completed)   B12 and Folate Panel (Completed)   Iron, TIBC and Ferritin Panel (Completed)   DOE (dyspnea on exertion)  Acute on chronic, encourage f/u with cards and pulm In no acute distress Vitals stable Normal iron panel Kidney function decline noted in past 6 months- referral placed       Relevant Orders   TSH + free T4 (Completed)   CBC with Differential/Platelet (Completed)   Vitamin D (25 hydroxy) (Completed)   B12 and Folate Panel (Completed)   Iron, TIBC and Ferritin Panel (Completed)   Hyperlipidemia    Chronic, stable LDL at 86 recommend diet low in saturated fat and regular exercise - 30 min at least 5 times per week Stroke/MI risk remains elevated at 11% in 10 years On Crestor at 5 mg with complaints of SE- recommend 3 week wash out. Encourage f/u after wash out  The 10-year ASCVD risk score (Arnett DK, et al., 2019) is: 10.6%   Values used to calculate the score:     Age: 85 years     Sex: Female     Is Non-Hispanic African American: No     Diabetic: No     Tobacco smoker: No     Systolic Blood Pressure: 287 mmHg     Is BP treated: Yes     HDL Cholesterol: 60 mg/dL     Total Cholesterol: 160 mg/dL       Relevant Medications   amLODipine (NORVASC) 10 MG tablet   lisinopril (ZESTRIL) 10 MG tablet   rosuvastatin (CRESTOR) 5 MG tablet   Other Relevant Orders   Lipid panel (Completed)     No follow-ups on file.      Vonna Kotyk, FNP, have reviewed all documentation for this visit. The documentation on 08/18/21 for the exam, diagnosis, procedures, and orders are all accurate and complete.    Gwyneth Sprout, [place] [phone] (phone) [phone] (fax)  [place]

## 2021-08-18 ENCOUNTER — Encounter: Payer: Self-pay | Admitting: Family Medicine

## 2021-08-18 DIAGNOSIS — R5382 Chronic fatigue, unspecified: Secondary | ICD-10-CM | POA: Insufficient documentation

## 2021-08-18 DIAGNOSIS — N189 Chronic kidney disease, unspecified: Secondary | ICD-10-CM | POA: Insufficient documentation

## 2021-08-18 LAB — CBC WITH DIFFERENTIAL/PLATELET
Basophils Absolute: 0.1 10*3/uL (ref 0.0–0.2)
Basos: 1 %
EOS (ABSOLUTE): 0.1 10*3/uL (ref 0.0–0.4)
Eos: 2 %
Hematocrit: 38.9 % (ref 34.0–46.6)
Hemoglobin: 13.5 g/dL (ref 11.1–15.9)
Immature Grans (Abs): 0 10*3/uL (ref 0.0–0.1)
Immature Granulocytes: 0 %
Lymphocytes Absolute: 3 10*3/uL (ref 0.7–3.1)
Lymphs: 37 %
MCH: 32.1 pg (ref 26.6–33.0)
MCHC: 34.7 g/dL (ref 31.5–35.7)
MCV: 93 fL (ref 79–97)
Monocytes Absolute: 0.7 10*3/uL (ref 0.1–0.9)
Monocytes: 9 %
Neutrophils Absolute: 4.1 10*3/uL (ref 1.4–7.0)
Neutrophils: 51 %
Platelets: 372 10*3/uL (ref 150–450)
RBC: 4.2 x10E6/uL (ref 3.77–5.28)
RDW: 12.4 % (ref 11.7–15.4)
WBC: 8.1 10*3/uL (ref 3.4–10.8)

## 2021-08-18 LAB — TSH+FREE T4
Free T4: 1.06 ng/dL (ref 0.82–1.77)
TSH: 0.891 u[IU]/mL (ref 0.450–4.500)

## 2021-08-18 LAB — COMPREHENSIVE METABOLIC PANEL
ALT: 15 IU/L (ref 0–32)
AST: 9 IU/L (ref 0–40)
Albumin/Globulin Ratio: 1.4 (ref 1.2–2.2)
Albumin: 4.5 g/dL (ref 3.9–4.9)
Alkaline Phosphatase: 74 IU/L (ref 44–121)
BUN/Creatinine Ratio: 14 (ref 12–28)
BUN: 20 mg/dL (ref 8–27)
Bilirubin Total: 0.5 mg/dL (ref 0.0–1.2)
CO2: 20 mmol/L (ref 20–29)
Calcium: 10.6 mg/dL — ABNORMAL HIGH (ref 8.7–10.3)
Chloride: 104 mmol/L (ref 96–106)
Creatinine, Ser: 1.45 mg/dL — ABNORMAL HIGH (ref 0.57–1.00)
Globulin, Total: 3.3 g/dL (ref 1.5–4.5)
Glucose: 99 mg/dL (ref 70–99)
Potassium: 5.1 mmol/L (ref 3.5–5.2)
Sodium: 141 mmol/L (ref 134–144)
Total Protein: 7.8 g/dL (ref 6.0–8.5)
eGFR: 39 mL/min/{1.73_m2} — ABNORMAL LOW (ref >59)

## 2021-08-18 LAB — LIPID PANEL
Chol/HDL Ratio: 2.7 ratio (ref 0.0–4.4)
Cholesterol, Total: 160 mg/dL (ref 100–199)
HDL: 60 mg/dL (ref >39)
LDL Chol Calc (NIH): 86 mg/dL (ref 0–99)
Triglycerides: 72 mg/dL (ref 0–149)
VLDL Cholesterol Cal: 14 mg/dL (ref 5–40)

## 2021-08-18 LAB — IRON,TIBC AND FERRITIN PANEL
Ferritin: 130 ng/mL (ref 15–150)
Iron Saturation: 25 % (ref 15–55)
Iron: 82 ug/dL (ref 27–139)
Total Iron Binding Capacity: 323 ug/dL (ref 250–450)
UIBC: 241 ug/dL (ref 118–369)

## 2021-08-18 LAB — B12 AND FOLATE PANEL
Folate: >20 ng/mL (ref >3.0)
Vitamin B-12: 1319 pg/mL — ABNORMAL HIGH (ref 232–1245)

## 2021-08-18 LAB — VITAMIN D 25 HYDROXY (VIT D DEFICIENCY, FRACTURES): Vit D, 25-Hydroxy: 42.2 ng/mL (ref 30.0–100.0)

## 2021-08-18 NOTE — Assessment & Plan Note (Signed)
Acute, new diagnosis encourage f/u with nephrology

## 2021-08-18 NOTE — Assessment & Plan Note (Signed)
Chronic, unknown cause Encourage f/u with pulm and cards Labs negative for cause/effect outside of elevated creatinine- follow up placed for referral with nephrology

## 2021-08-18 NOTE — Assessment & Plan Note (Signed)
Acute on chronic, encourage f/u with cards and pulm In no acute distress Vitals stable Normal iron panel Kidney function decline noted in past 6 months- referral placed

## 2021-08-18 NOTE — Assessment & Plan Note (Signed)
Chronic, presumed to worsened given complaints of fatigue and DOE Encourage f/u with cardiology Will check labs for external sources

## 2021-08-18 NOTE — Assessment & Plan Note (Signed)
Chronic, complaints of DOE LCTAB Encourage f/u with pulm for repeat PFTS Request for PRN inhaler refill Continue breo daily 1 puff

## 2021-08-18 NOTE — Assessment & Plan Note (Signed)
Chronic, stable LDL at 86 recommend diet low in saturated fat and regular exercise - 30 min at least 5 times per week Stroke/MI risk remains elevated at 11% in 10 years On Crestor at 5 mg with complaints of SE- recommend 3 week wash out. Encourage f/u after wash out  The 10-year ASCVD risk score (Arnett DK, et al., 2019) is: 10.6%   Values used to calculate the score:     Age: 70 years     Sex: Female     Is Non-Hispanic African American: No     Diabetic: No     Tobacco smoker: No     Systolic Blood Pressure: 122 mmHg     Is BP treated: Yes     HDL Cholesterol: 60 mg/dL     Total Cholesterol: 160 mg/dL

## 2021-08-18 NOTE — Assessment & Plan Note (Signed)
Chronic, stable Continue norvasc 10, lisinopril 10

## 2021-08-27 ENCOUNTER — Other Ambulatory Visit: Payer: Self-pay | Admitting: Nephrology

## 2021-08-27 DIAGNOSIS — N1832 Chronic kidney disease, stage 3b: Secondary | ICD-10-CM | POA: Diagnosis not present

## 2021-08-27 DIAGNOSIS — N281 Cyst of kidney, acquired: Secondary | ICD-10-CM | POA: Diagnosis not present

## 2021-08-27 DIAGNOSIS — I10 Essential (primary) hypertension: Secondary | ICD-10-CM | POA: Diagnosis not present

## 2021-08-27 DIAGNOSIS — J4530 Mild persistent asthma, uncomplicated: Secondary | ICD-10-CM | POA: Diagnosis not present

## 2021-08-27 DIAGNOSIS — K219 Gastro-esophageal reflux disease without esophagitis: Secondary | ICD-10-CM | POA: Diagnosis not present

## 2021-08-27 DIAGNOSIS — E785 Hyperlipidemia, unspecified: Secondary | ICD-10-CM | POA: Diagnosis not present

## 2021-08-27 DIAGNOSIS — N130 Hydronephrosis with ureteropelvic junction obstruction: Secondary | ICD-10-CM | POA: Diagnosis not present

## 2021-09-03 ENCOUNTER — Ambulatory Visit
Admission: RE | Admit: 2021-09-03 | Discharge: 2021-09-03 | Disposition: A | Discharge status: Home / Self Care | Payer: Medicare Other | Source: Ambulatory Visit | Attending: Nephrology | Admitting: Nephrology

## 2021-09-03 DIAGNOSIS — N1832 Chronic kidney disease, stage 3b: Secondary | ICD-10-CM | POA: Insufficient documentation

## 2021-09-03 DIAGNOSIS — N281 Cyst of kidney, acquired: Secondary | ICD-10-CM | POA: Diagnosis not present

## 2021-09-03 DIAGNOSIS — N1330 Unspecified hydronephrosis: Secondary | ICD-10-CM | POA: Diagnosis not present

## 2021-09-03 DIAGNOSIS — N189 Chronic kidney disease, unspecified: Secondary | ICD-10-CM | POA: Diagnosis not present

## 2021-09-07 ENCOUNTER — Other Ambulatory Visit: Payer: Self-pay | Admitting: Family Medicine

## 2021-09-07 DIAGNOSIS — E782 Mixed hyperlipidemia: Secondary | ICD-10-CM

## 2021-09-25 ENCOUNTER — Other Ambulatory Visit: Payer: Self-pay | Admitting: Family Medicine

## 2021-09-25 DIAGNOSIS — E782 Mixed hyperlipidemia: Secondary | ICD-10-CM

## 2021-09-25 NOTE — Telephone Encounter (Signed)
Pharmacy states they never received rx for Crestor that says hold for 3 weeks. It did not have a received receipt and the pharm was requesting an old rx to be filled.  Pt called to make sure she did hold the Crestor for 3 weeks. Pt said she did hold it and restarted it a few days ago and her symptoms are back.

## 2021-09-25 NOTE — Telephone Encounter (Signed)
New rx available, having symptoms and not going to take. Requested Prescriptions  Pending Prescriptions Disp Refills  . rosuvastatin (CRESTOR) 5 MG tablet [Pharmacy Med Name: ROSUVASTATIN CALCIUM 5 MG TAB] 90 tablet 1    Sig: TAKE 1 TABLET (5 MG TOTAL) BY MOUTH DAILY. STOP ATORVASTATIN     Cardiovascular:  Antilipid - Statins 2 Failed - 09/25/2021  4:52 PM      Failed - Cr in normal range and within 360 days    Creatinine, Ser  Date Value Ref Range Status  08/17/2021 1.45 (H) 0.57 - 1.00 mg/dL Final         Failed - Lipid Panel in normal range within the last 12 months    Cholesterol, Total  Date Value Ref Range Status  08/17/2021 160 100 - 199 mg/dL Final   LDL Chol Calc (NIH)  Date Value Ref Range Status  08/17/2021 86 0 - 99 mg/dL Final   HDL  Date Value Ref Range Status  08/17/2021 60 >39 mg/dL Final   Triglycerides  Date Value Ref Range Status  08/17/2021 72 0 - 149 mg/dL Final         Passed - Patient is not pregnant      Passed - Valid encounter within last 12 months    Recent Outpatient Visits          1 month ago Essential (primary) hypertension   [facility] Jacky Kindle, FNP   7 months ago Encounter for annual physical exam   [facility], Marzella Schlein, [place]   1 year ago GAD (generalized anxiety disorder)   [facility], Marzella Schlein, [place]   1 year ago Complicated grief   [facility], Marzella Schlein, [place]   1 year ago Mild intermittent extrinsic asthma without complication   [facility] [place], Alessandra Bevels, [place]

## 2021-10-09 ENCOUNTER — Other Ambulatory Visit: Payer: Self-pay | Admitting: Family Medicine

## 2021-10-09 DIAGNOSIS — E782 Mixed hyperlipidemia: Secondary | ICD-10-CM

## 2021-10-13 DIAGNOSIS — I10 Essential (primary) hypertension: Secondary | ICD-10-CM | POA: Diagnosis not present

## 2021-10-13 DIAGNOSIS — N130 Hydronephrosis with ureteropelvic junction obstruction: Secondary | ICD-10-CM | POA: Diagnosis not present

## 2021-10-13 DIAGNOSIS — N281 Cyst of kidney, acquired: Secondary | ICD-10-CM | POA: Diagnosis not present

## 2021-10-13 DIAGNOSIS — N1832 Chronic kidney disease, stage 3b: Secondary | ICD-10-CM | POA: Diagnosis not present

## 2021-10-21 DIAGNOSIS — E785 Hyperlipidemia, unspecified: Secondary | ICD-10-CM | POA: Diagnosis not present

## 2021-10-21 DIAGNOSIS — N1832 Chronic kidney disease, stage 3b: Secondary | ICD-10-CM | POA: Diagnosis not present

## 2021-10-21 DIAGNOSIS — N200 Calculus of kidney: Secondary | ICD-10-CM | POA: Diagnosis not present

## 2021-10-21 DIAGNOSIS — I10 Essential (primary) hypertension: Secondary | ICD-10-CM | POA: Diagnosis not present

## 2021-10-21 DIAGNOSIS — K219 Gastro-esophageal reflux disease without esophagitis: Secondary | ICD-10-CM | POA: Diagnosis not present

## 2021-10-21 DIAGNOSIS — N1339 Other hydronephrosis: Secondary | ICD-10-CM | POA: Diagnosis not present

## 2021-10-21 DIAGNOSIS — N281 Cyst of kidney, acquired: Secondary | ICD-10-CM | POA: Diagnosis not present

## 2021-10-21 DIAGNOSIS — N130 Hydronephrosis with ureteropelvic junction obstruction: Secondary | ICD-10-CM | POA: Diagnosis not present

## 2021-10-21 DIAGNOSIS — J4530 Mild persistent asthma, uncomplicated: Secondary | ICD-10-CM | POA: Diagnosis not present

## 2021-11-03 NOTE — Progress Notes (Signed)
11/04/2021 1:24 PM   Whitney Stuart [birth year] [id number]  Referring provider: Lyla Son, MD [address] [place],  [place] [address]  Chief Complaint  Patient presents with   New Patient (Initial Visit)   Hydronephrosis    HPI: 71 year old female referred for further evaluation of incidental hydronephrosis.  She being followed by nephrology, Dr. Lanora Manis and recently noted to have increase in her creatinine up to 1.7 from baseline around 1.1.  She notes that during this time, she was drinking daily protein drinks and had a significant diet change.  For further evaluation of this change, she underwent a renal ultrasound demonstrating duplicated right collecting system with a 7 mm nonobstructing stone as well as mild left hydronephrosis unchanged from CT scan in July 2019 thought to be a mild congenital UPJ obstruction.  She reports that she has good urine output, has gone back to her previous diet, denies any flank pain, hematuria, or recurrent urinary tract infections.  She does have some mild back pain across her entire low back at times with prolonged standing but otherwise no complaints.  She was seen and evaluated by urology back in 2019 for an isolated episode of left flank pain and had a noncontrast CT scan at the time with outlined description as above.  Notably, this was felt to represent a left mild congenital UPJ obstruction which was also noted back in 2010 on ultrasound.   PMH: Past Medical History:  Diagnosis Date   Arthritis    Asthma    Asthma    Chest pain    a. 11/2019 Cor CTA: Calcium score equals 0.  Normal coronary arteries.   Diastolic dysfunction    a. 11/2019 Echo: EF 60-65%.  Grade 1 diastolic dysfunction.  No regional wall motion abnormalities.  RVSP 47 mmHg.  Moderately dilated left atrium.  Mild to moderate MR.   GERD (gastroesophageal reflux disease)    History of cervical cancer    Hyperlipidemia    Hypertension     Mitral regurgitation    PAH (pulmonary artery hypertension) ([place])    a. 11/2019 Echo: RVSP 3mHg.   Sleep-disordered breathing     Surgical History: Past Surgical History:  Procedure Laterality Date   ABDOMINAL HYSTERECTOMY  1999   total   HAND SURGERY Bilateral 2003   carpal tunnel   LIPOMA EXCISION      Home Medications:  Allergies as of 11/04/2021   No Known Allergies      Medication List        Accurate as of November 04, 2021 11:59 PM. If you have any questions, ask your nurse or doctor.          STOP taking these medications    CALCIUM 600+D3 PO Stopped by: AHollice Espy MD       TAKE these medications    albuterol 108 (90 Base) MCG/ACT inhaler Commonly known as: VENTOLIN HFA Inhale 2 puffs into the lungs every 6 (six) hours as needed for wheezing or shortness of breath.   amLODipine 10 MG tablet Commonly known as: NORVASC Take 1 tablet (10 mg total) by mouth daily.   aspirin 81 MG tablet Take 81 mg by mouth daily.   Breo Ellipta 200-25 MCG/ACT Aepb Generic drug: fluticasone furoate-vilanterol TAKE 1 PUFF BY MOUTH EVERY DAY   clonazePAM 0.5 MG tablet Commonly known as: KLONOPIN Take 0.5-1 tablets (0.25-0.5 mg total) by mouth 2 (two) times daily as needed for anxiety.   cyclobenzaprine 5 MG  tablet Commonly known as: FLEXERIL Take 1 tablet (5 mg total) by mouth at bedtime.   lisinopril 10 MG tablet Commonly known as: ZESTRIL Take 1 tablet (10 mg total) by mouth daily.   loratadine 10 MG tablet Commonly known as: CLARITIN Take 10 mg by mouth daily.   ondansetron 4 MG tablet Commonly known as: Zofran Take 1 tablet (4 mg total) by mouth every 8 (eight) hours as needed for nausea or vomiting.   rosuvastatin 5 MG tablet Commonly known as: CRESTOR TAKE 1 TABLET (5 MG TOTAL) BY MOUTH DAILY. STOP ATORVASTATIN   sertraline 50 MG tablet Commonly known as: ZOLOFT TAKE 1 TABLET (50 MG TOTAL) BY MOUTH EVERY OTHER DAY.        Allergies:  No Known Allergies  Family History: Family History  Problem Relation Age of Onset   Heart disease Mother    Stroke Mother    Congestive Heart Failure Mother    Hypertension Mother    Hypertension Father    Kidney disease Father    Heart murmur Sister    Hypertension Sister    Ovarian cancer Sister        ovarian   Hypertension Sister    Hypertension Sister    Lung cancer Brother    Hypertension Brother    Hypertension Brother    Sleep apnea Brother    Hypertension Brother    Heart disease Brother    Hypertension Brother    Hypertension Brother    Hypertension Brother    Hypertension Brother    Hypertension Brother    Mental illness Maternal Aunt    Colon cancer Neg Hx    Breast cancer Neg Hx     Social History:  reports that she quit smoking about 24 years ago. Her smoking use included cigarettes. She has a 3.00 pack-year smoking history. She has never used smokeless tobacco. She reports that she does not drink alcohol and does not use drugs.   Physical Exam: BP 124/78   Pulse 96   Ht _0  (1.702 m)   Wt 196 lb (88.9 kg)   BMI 30.70 kg/m   Constitutional:  Alert and oriented, No acute distress.  Calm by her daughter today. HEENT: [place] AT, moist mucus membranes.  Trachea midline, no masses. Cardiovascular: No clubbing, cyanosis, or edema. Respiratory: Normal respiratory effort, no increased work of breathing. Neurologic: Grossly intact, no focal deficits, moving all 4 extremities. Psychiatric: Normal mood and affect.  Laboratory Data: Lab Results  Component Value Date   WBC 8.1 08/17/2021   HGB 13.5 08/17/2021   HCT 38.9 08/17/2021   MCV 93 08/17/2021   PLT 372 08/17/2021    Lab Results  Component Value Date   CREATININE 1.27 (H) 11/04/2021     Urinalysis Results for orders placed or performed in visit on 11/04/21  Microscopic Examination   Urine  Result Value Ref Range   WBC, UA 11-30 (A) 0 - 5 /hpf   RBC, Urine 0-2 0 - 2 /hpf   Epithelial Cells  (non renal) 0-10 0 - 10 /hpf   Bacteria, UA Moderate (A) None seen/Few  Urinalysis, Complete  Result Value Ref Range   Specific Gravity, UA 1.020 1.005 - 1.030   pH, UA 5.0 5.0 - 7.5   Color, UA Yellow Yellow   Appearance Ur Hazy (A) Clear   Leukocytes,UA 2+ (A) Negative   Protein,UA Negative Negative/Trace   Glucose, UA Negative Negative   Ketones, UA Negative Negative   RBC,  UA Negative Negative   Bilirubin, UA Negative Negative   Urobilinogen, Ur 0.2 0.2 - 1.0 mg/dL   Nitrite, UA Negative Negative   Microscopic Examination See below:   Basic metabolic panel  Result Value Ref Range   Glucose 91 70 - 99 mg/dL   BUN 14 8 - 27 mg/dL   Creatinine, Ser 1.27 (H) 0.57 - 1.00 mg/dL   eGFR 45 (L) >59 mL/min/1.73   BUN/Creatinine Ratio 11 (L) 12 - 28   Sodium 138 134 - 144 mmol/L   Potassium 5.1 3.5 - 5.2 mmol/L   Chloride 103 96 - 106 mmol/L   CO2 21 20 - 29 mmol/L   Calcium 9.7 8.7 - 10.3 mg/dL     Pertinent Imaging: Renal ultrasound images from 09/03/2021 were personally reviewed and compared to her previous CT scan back in 07/2017 as well as abdominal ultrasound in 2010.  Assessment & Plan:    1. Hydronephrosis with ureteropelvic junction (UPJ) obstruction Mild left hydronephrosis consistent with mild left congenital UPJ obstruction, she is asymptomatic from this and I suspect that her rising creatinine is unrelated to this  We repeated labs today, creatinine is trending down which is reassuring  If her creatinine continues to worsen, could consider Lasix renal ultrasound however in discussing definitive options for UPJ obstruction including pyeloplasty or stent, she is not particularly interested in either.  Again, given that it appears to be relatively mild and stable, low threshold to recommend intervention.  She and her daughter are agreeable with this plan. - Urinalysis, Complete - Basic metabolic panel  2. Stage 2 chronic kidney disease As above, labs rechecked  today  3. Duplicated collecting system Incidental on the right, normal anatomic variant  4. Kidney stones [place] and all nonobstructing stone, unchanged from 2019 she is asymptomatic and never had a kidney stone event  I would recommend continue observation for this, return if she becomes symptomatic  Given stability, no concern for metabolically active stone disease   Return if symptoms worsen or fail to improve.  Hollice Espy, MD  [facility] [address] [place], [place] [address] [phone]

## 2021-11-04 ENCOUNTER — Encounter: Payer: Self-pay | Admitting: Urology

## 2021-11-04 ENCOUNTER — Ambulatory Visit (INDEPENDENT_AMBULATORY_CARE_PROVIDER_SITE_OTHER): Payer: Medicare Other | Admitting: Urology

## 2021-11-04 VITALS — BP 124/78 | HR 96 | Ht 67.0 in | Wt 196.0 lb

## 2021-11-04 DIAGNOSIS — Q625 Duplication of ureter: Secondary | ICD-10-CM

## 2021-11-04 DIAGNOSIS — N1330 Unspecified hydronephrosis: Secondary | ICD-10-CM | POA: Diagnosis not present

## 2021-11-04 DIAGNOSIS — N182 Chronic kidney disease, stage 2 (mild): Secondary | ICD-10-CM

## 2021-11-04 DIAGNOSIS — Z87442 Personal history of urinary calculi: Secondary | ICD-10-CM | POA: Diagnosis not present

## 2021-11-04 DIAGNOSIS — Q6211 Congenital occlusion of ureteropelvic junction: Secondary | ICD-10-CM

## 2021-11-04 DIAGNOSIS — N200 Calculus of kidney: Secondary | ICD-10-CM

## 2021-11-05 ENCOUNTER — Telehealth: Payer: Self-pay

## 2021-11-05 LAB — BASIC METABOLIC PANEL
BUN/Creatinine Ratio: 11 — ABNORMAL LOW (ref 12–28)
BUN: 14 mg/dL (ref 8–27)
CO2: 21 mmol/L (ref 20–29)
Calcium: 9.7 mg/dL (ref 8.7–10.3)
Chloride: 103 mmol/L (ref 96–106)
Creatinine, Ser: 1.27 mg/dL — ABNORMAL HIGH (ref 0.57–1.00)
Glucose: 91 mg/dL (ref 70–99)
Potassium: 5.1 mmol/L (ref 3.5–5.2)
Sodium: 138 mmol/L (ref 134–144)
eGFR: 45 mL/min/{1.73_m2} — ABNORMAL LOW (ref >59)

## 2021-11-05 LAB — MICROSCOPIC EXAMINATION

## 2021-11-05 LAB — URINALYSIS, COMPLETE
Bilirubin, UA: NEGATIVE
Glucose, UA: NEGATIVE
Ketones, UA: NEGATIVE
Nitrite, UA: NEGATIVE
Protein,UA: NEGATIVE
RBC, UA: NEGATIVE
Specific Gravity, UA: 1.02 (ref 1.005–1.030)
Urobilinogen, Ur: 0.2 mg/dL (ref 0.2–1.0)
pH, UA: 5 (ref 5.0–7.5)

## 2021-11-05 NOTE — Telephone Encounter (Signed)
Left detailed message with results

## 2021-11-05 NOTE — Telephone Encounter (Signed)
-----   Message from Hollice Espy, MD sent at 11/05/2021  7:47 AM EDT ----- Your creatinine is improving, great news.   Hollice Espy, MD

## 2021-12-09 ENCOUNTER — Ambulatory Visit (INDEPENDENT_AMBULATORY_CARE_PROVIDER_SITE_OTHER): Payer: Medicare Other

## 2021-12-09 VITALS — BP 144/82 | Ht 67.0 in | Wt 192.5 lb

## 2021-12-09 DIAGNOSIS — Z0000 Encounter for general adult medical examination without abnormal findings: Secondary | ICD-10-CM | POA: Diagnosis not present

## 2021-12-09 NOTE — Progress Notes (Signed)
Subjective:   Whitney Stuart is a 71 y.o. female who presents for Medicare Annual (Subsequent) preventive examination.  Review of Systems     Cardiac Risk Factors include: advanced age (>76men, >52 women);dyslipidemia;hypertension     Objective:    Today's Vitals   12/09/21 1410  BP: (!) 144/82  Weight: 192 lb 8 oz (87.3 kg)  Height: 5\' 7"  (1.702 m)   Body mass index is 30.15 kg/m.     12/09/2021    2:15 PM 12/08/2020    2:34 PM 03/14/2019    9:41 AM 03/07/2018    1:30 PM 08/24/2017    3:38 PM  Advanced Directives  Does Patient Have a Medical Advance Directive? No No No No No  Would patient like information on creating a medical advance directive? No - Patient declined No - Patient declined No - Patient declined No - Patient declined No - Patient declined    Current Medications (verified) Outpatient Encounter Medications as of 12/09/2021  Medication Sig   albuterol (VENTOLIN HFA) 108 (90 Base) MCG/ACT inhaler Inhale 2 puffs into the lungs every 6 (six) hours as needed for wheezing or shortness of breath.   amLODipine (NORVASC) 10 MG tablet Take 1 tablet (10 mg total) by mouth daily.   aspirin 81 MG tablet Take 81 mg by mouth daily.   BREO ELLIPTA 200-25 MCG/ACT AEPB TAKE 1 PUFF BY MOUTH EVERY DAY   clonazePAM (KLONOPIN) 0.5 MG tablet Take 0.5-1 tablets (0.25-0.5 mg total) by mouth 2 (two) times daily as needed for anxiety.   cyclobenzaprine (FLEXERIL) 5 MG tablet Take 1 tablet (5 mg total) by mouth at bedtime.   lisinopril (ZESTRIL) 10 MG tablet Take 1 tablet (10 mg total) by mouth daily.   loratadine (CLARITIN) 10 MG tablet Take 10 mg by mouth daily.   ondansetron (ZOFRAN) 4 MG tablet Take 1 tablet (4 mg total) by mouth every 8 (eight) hours as needed for nausea or vomiting.   rosuvastatin (CRESTOR) 5 MG tablet TAKE 1 TABLET (5 MG TOTAL) BY MOUTH DAILY. STOP ATORVASTATIN   sertraline (ZOLOFT) 50 MG tablet TAKE 1 TABLET (50 MG TOTAL) BY MOUTH EVERY OTHER DAY.   No  facility-administered encounter medications on file as of 12/09/2021.    Allergies (verified) Patient has no known allergies.   History: Past Medical History:  Diagnosis Date   Arthritis    Asthma    Asthma    Chest pain    a. 11/2019 Cor CTA: Calcium score equals 0.  Normal coronary arteries.   Diastolic dysfunction    a. 11/2019 Echo: EF 60-65%.  Grade 1 diastolic dysfunction.  No regional wall motion abnormalities.  RVSP 47 mmHg.  Moderately dilated left atrium.  Mild to moderate MR.   GERD (gastroesophageal reflux disease)    History of cervical cancer    Hyperlipidemia    Hypertension    Mitral regurgitation    PAH (pulmonary artery hypertension) ([place])    a. 11/2019 Echo: RVSP 15mmHg.   Sleep-disordered breathing    Past Surgical History:  Procedure Laterality Date   ABDOMINAL HYSTERECTOMY  1999   total   HAND SURGERY Bilateral 2003   carpal tunnel   LIPOMA EXCISION     Family History  Problem Relation Age of Onset   Heart disease Mother    Stroke Mother    Congestive Heart Failure Mother    Hypertension Mother    Hypertension Father    Kidney disease Father    Heart  murmur Sister    Hypertension Sister    Ovarian cancer Sister        ovarian   Hypertension Sister    Hypertension Sister    Lung cancer Brother    Hypertension Brother    Hypertension Brother    Sleep apnea Brother    Hypertension Brother    Heart disease Brother    Hypertension Brother    Hypertension Brother    Hypertension Brother    Hypertension Brother    Hypertension Brother    Mental illness Maternal Aunt    Colon cancer Neg Hx    Breast cancer Neg Hx    Social History   Socioeconomic History   Marital status: Married    Spouse name: Porsche Santell   Number of children: 7   Years of education: Not on file   Highest education level: GED or equivalent  Occupational History   Occupation: Retired  Tobacco Use   Smoking status: Former    Packs/day: 0.50    Years: 6.00     Total pack years: 3.00    Types: Cigarettes    Quit date: 01/24/1997    Years since quitting: 24.8   Smokeless tobacco: Never  Vaping Use   Vaping Use: Never used  Substance and Sexual Activity   Alcohol use: No   Drug use: No   Sexual activity: Yes    Birth control/protection: Surgical  Other Topics Concern   Not on file  Social History Narrative   Not on file   Social Determinants of Health   Financial Resource Strain: Low Risk  (12/09/2021)   Overall Financial Resource Strain (CARDIA)    Difficulty of Paying Living Expenses: Not hard at all  Food Insecurity: No Food Insecurity (12/09/2021)   Hunger Vital Sign    Worried About Running Out of Food in the Last Year: Never true    [place] in the Last Year: Never true  Transportation Needs: No Transportation Needs (12/09/2021)   PRAPARE - Hydrologist (Medical): No    Lack of Transportation (Non-Medical): No  Physical Activity: Insufficiently Active (12/09/2021)   Exercise Vital Sign    Days of Exercise per Week: 2 days    Minutes of Exercise per Session: 20 min  Stress: No Stress Concern Present (12/09/2021)   [employer]    Feeling of Stress : Not at all  Social Connections: Socially Isolated (12/09/2021)   Social Connection and Isolation Panel [NHANES]    Frequency of Communication with Friends and Family: More than three times a week    Frequency of Social Gatherings with Friends and Family: Never    Attends Religious Services: Never    Marine scientist or Organizations: No    Attends Archivist Meetings: Never    Marital Status: Widowed    Tobacco Counseling Counseling given: Not Answered   Clinical Intake:  Pre-visit preparation completed: Yes  Pain : No/denies pain     Nutritional Risks: None Diabetes: No  How often do you need to have someone help you when you read instructions,  pamphlets, or other written materials from your doctor or pharmacy?: 1 - Never  Diabetic?no  Interpreter Needed?: No  Information entered by :: Kirke Shaggy, LPN   Activities of Daily Living    12/09/2021    2:16 PM 02/16/2021    2:08 PM  In your present state of health,  do you have any difficulty performing the following activities:  Hearing? 0 0  Vision? 0 0  Difficulty concentrating or making decisions? 0 0  Walking or climbing stairs? 0 0  Dressing or bathing? 0 0  Doing errands, shopping? 0 0  Preparing Food and eating ? N   Using the Toilet? N   In the past six months, have you accidently leaked urine? N   Do you have problems with loss of bowel control? N   Managing your Medications? N   Managing your Finances? N   Housekeeping or managing your Housekeeping? N     Patient Care Team: Virginia Crews, MD as PCP - General (Family Medicine) Wellington Hampshire, MD as PCP - Cardiology (Cardiology) Marchia Meiers, MD (Inactive) as Consulting Physician (Ophthalmology)  Indicate any recent Medical Services you may have received from other than [facility] providers in the past year (date may be approximate).     Assessment:   This is a routine wellness examination for Adrean.  Hearing/Vision screen Hearing Screening - Comments:: No aids Vision Screening - Comments:: Wears glasses- [facility]  Dietary issues and exercise activities discussed: Current Exercise Habits: Home exercise routine, Type of exercise: walking, Time (Minutes): 20, Frequency (Times/Week): 2, Weekly Exercise (Minutes/Week): 40, Intensity: Mild   Goals Addressed             This Visit's Progress    DIET - EAT MORE FRUITS AND VEGETABLES         Depression Screen    12/09/2021    2:12 PM 08/17/2021    1:07 PM 02/16/2021    2:08 PM 07/17/2020    1:18 PM 10/25/2019   10:42 AM 09/18/2019    9:15 AM 03/14/2019    9:39 AM  PHQ 2/9 Scores  PHQ - 2 Score 0 2 0 0 1 5 0  PHQ- 9 Score 0 8 4 5 4 14       Fall Risk    12/09/2021    2:15 PM 08/17/2021    1:09 PM 02/16/2021    2:07 PM 12/08/2020    2:36 PM 10/25/2019   10:43 AM  Fall Risk   Falls in the past year? 0 0 0 0 0  Number falls in past yr: 0 0 0 0 0  Injury with Fall? 0 0 0 0 0  Risk for fall due to : No Fall Risks  No Fall Risks No Fall Risks No Fall Risks  Follow up Falls prevention discussed;Falls evaluation completed  Falls evaluation completed Falls prevention discussed Falls evaluation completed    FALL RISK PREVENTION PERTAINING TO THE HOME:  Any stairs in or around the home? Yes  If so, are there any without handrails? No  Home free of loose throw rugs in walkways, pet beds, electrical cords, etc? Yes  Adequate lighting in your home to reduce risk of falls? Yes   ASSISTIVE DEVICES UTILIZED TO PREVENT FALLS:  Life alert? No  Use of a cane, walker or w/c? No  Grab bars in the bathroom? No  Shower chair or bench in shower? No  Elevated toilet seat or a handicapped toilet? No   TIMED UP AND GO:  Was the test performed? Yes .  Length of time to ambulate 10 feet: 4 sec.   Gait steady and fast without use of assistive device  Cognitive Function:        12/09/2021    2:20 PM  6CIT Screen  What Year? 0 points  What month? 0 points  What time? 0 points  Count back from 20 0 points  Months in reverse 2 points  Repeat phrase 4 points  Total Score 6 points    Immunizations Immunization History  Administered Date(s) Administered   Fluad Quad(high Dose 65+) 10/25/2019   Influenza, High Dose Seasonal PF 01/12/2017, 03/07/2018   Pneumococcal Conjugate-13 01/12/2017   Pneumococcal Polysaccharide-23 03/07/2018   Td 08/23/1995, 06/01/2019    TDAP status: Up to date  Flu Vaccine status: Declined, Education has been provided regarding the importance of this vaccine but patient still declined. Advised may receive this vaccine at local pharmacy or Health Dept. Aware to provide a copy of the vaccination  record if obtained from local pharmacy or Health Dept. Verbalized acceptance and understanding.  Pneumococcal vaccine status: Up to date  Covid-19 vaccine status: Declined, Education has been provided regarding the importance of this vaccine but patient still declined. Advised may receive this vaccine at local pharmacy or Health Dept.or vaccine clinic. Aware to provide a copy of the vaccination record if obtained from local pharmacy or Health Dept. Verbalized acceptance and understanding.  Qualifies for Shingles Vaccine? Yes   Zostavax completed No   Shingrix Completed?: No.    Education has been provided regarding the importance of this vaccine. Patient has been advised to call insurance company to determine out of pocket expense if they have not yet received this vaccine. Advised may also receive vaccine at local pharmacy or Health Dept. Verbalized acceptance and understanding.  Screening Tests Health Maintenance  Topic Date Due   COVID-19 Vaccine (1) Never done   Zoster Vaccines- Shingrix (1 of 2) Never done   MAMMOGRAM  07/18/2021   INFLUENZA VACCINE  08/25/2021   DEXA SCAN  02/22/2022   Medicare Annual Wellness (AWV)  12/10/2022   Fecal DNA (Cologuard)  03/03/2024   TETANUS/TDAP  05/31/2029   Pneumonia Vaccine 64+ Years old  Completed   Hepatitis C Screening  Completed   HPV VACCINES  Aged Out    Health Maintenance  Health Maintenance Due  Topic Date Due   COVID-19 Vaccine (1) Never done   Zoster Vaccines- Shingrix (1 of 2) Never done   MAMMOGRAM  07/18/2021   INFLUENZA VACCINE  08/25/2021    Colorectal cancer screening: Type of screening: Cologuard. Completed 03/10/21. Repeat every 3 years  Mammogram status: Completed 07/18/20. Repeat every year- declined referral  Bone Density status: Completed 02/22/17. Results reflect: Bone density results: OSTEOPENIA. Repeat every 5 years.  Lung Cancer Screening: (Low Dose CT Chest recommended if Age 79-80 years, 30 pack-year  currently smoking OR have quit w/in 15years.) does not qualify.    Additional Screening:  Hepatitis C Screening: does qualify; Completed 09/05/20  Vision Screening: Recommended annual ophthalmology exams for early detection of glaucoma and other disorders of the eye. Is the patient up to date with their annual eye exam?  Yes  Who is the provider or what is the name of the office in which the patient attends annual eye exams? [facility]  If pt is not established with a provider, would they like to be referred to a provider to establish care? No .   Dental Screening: Recommended annual dental exams for proper oral hygiene  Community Resource Referral / Chronic Care Management: CRR required this visit?  No   CCM required this visit?  No      Plan:     I have personally reviewed and noted the following in the patient's chart:  Medical and social history Use of alcohol, tobacco or illicit drugs  Current medications and supplements including opioid prescriptions. Patient is not currently taking opioid prescriptions. Functional ability and status Nutritional status Physical activity Advanced directives List of other physicians Hospitalizations, surgeries, and ER visits in previous 12 months Vitals Screenings to include cognitive, depression, and falls Referrals and appointments  In addition, I have reviewed and discussed with patient certain preventive protocols, quality metrics, and best practice recommendations. A written personalized care plan for preventive services as well as general preventive health recommendations were provided to patient.     Dionisio David, LPN   [id number]   Nurse Notes: none

## 2021-12-09 NOTE — Patient Instructions (Signed)
Ms. Schmuck , Thank you for taking time to come for your Medicare Wellness Visit. I appreciate your ongoing commitment to your health goals. Please review the following plan we discussed and let me know if I can assist you in the future.   Screening recommendations/referrals: Colonoscopy: Cologuard 03/10/21 Mammogram: 07/18/20, declined referral for this year Bone Density: 02/22/17 Recommended yearly ophthalmology/optometry visit for glaucoma screening and checkup Recommended yearly dental visit for hygiene and checkup  Vaccinations: Influenza vaccine: n/d Pneumococcal vaccine: 03/07/18 Tdap vaccine: 06/01/19 Shingles vaccine: n/d   Covid-19:n/d  Advanced directives: no  Conditions/risks identified: none  Next appointment: Follow up in one year for your annual wellness visit 12/13/22 @ 2 pm in person   Preventive Care 65 Years and Older, Female Preventive care refers to lifestyle choices and visits with your health care provider that can promote health and wellness. What does preventive care include? A yearly physical exam. This is also called an annual well check. Dental exams once or twice a year. Routine eye exams. Ask your health care provider how often you should have your eyes checked. Personal lifestyle choices, including: Daily care of your teeth and gums. Regular physical activity. Eating a healthy diet. Avoiding tobacco and drug use. Limiting alcohol use. Practicing safe sex. Taking low-dose aspirin every day. Taking vitamin and mineral supplements as recommended by your health care provider. What happens during an annual well check? The services and screenings done by your health care provider during your annual well check will depend on your age, overall health, lifestyle risk factors, and family history of disease. Counseling  Your health care provider may ask you questions about your: Alcohol use. Tobacco use. Drug use. Emotional well-being. Home and relationship  well-being. Sexual activity. Eating habits. History of falls. Memory and ability to understand (cognition). Work and work Astronomer. Reproductive health. Screening  You may have the following tests or measurements: Height, weight, and BMI. Blood pressure. Lipid and cholesterol levels. These may be checked every 5 years, or more frequently if you are over 76 years old. Skin check. Lung cancer screening. You may have this screening every year starting at age 5 if you have a 30-pack-year history of smoking and currently smoke or have quit within the past 15 years. Fecal occult blood test (FOBT) of the stool. You may have this test every year starting at age 66. Flexible sigmoidoscopy or colonoscopy. You may have a sigmoidoscopy every 5 years or a colonoscopy every 10 years starting at age 16. Hepatitis C blood test. Hepatitis B blood test. Sexually transmitted disease (STD) testing. Diabetes screening. This is done by checking your blood sugar (glucose) after you have not eaten for a while (fasting). You may have this done every 1-3 years. Bone density scan. This is done to screen for osteoporosis. You may have this done starting at age 35. Mammogram. This may be done every 1-2 years. Talk to your health care provider about how often you should have regular mammograms. Talk with your health care provider about your test results, treatment options, and if necessary, the need for more tests. Vaccines  Your health care provider may recommend certain vaccines, such as: Influenza vaccine. This is recommended every year. Tetanus, diphtheria, and acellular pertussis (Tdap, Td) vaccine. You may need a Td booster every 10 years. Zoster vaccine. You may need this after age 1. Pneumococcal 13-valent conjugate (PCV13) vaccine. One dose is recommended after age 16. Pneumococcal polysaccharide (PPSV23) vaccine. One dose is recommended after age 88. Talk  to your health care provider about which  screenings and vaccines you need and how often you need them. This information is not intended to replace advice given to you by your health care provider. Make sure you discuss any questions you have with your health care provider. Document Released: 02/07/2015 Document Revised: 10/01/2015 Document Reviewed: 11/12/2014 Elsevier Interactive Patient Education  2017 [place] Prevention in the Home Falls can cause injuries. They can happen to people of all ages. There are many things you can do to make your home safe and to help prevent falls. What can I do on the outside of my home? Regularly fix the edges of walkways and driveways and fix any cracks. Remove anything that might make you trip as you walk through a door, such as a raised step or threshold. Trim any bushes or trees on the path to your home. Use bright outdoor lighting. Clear any walking paths of anything that might make someone trip, such as rocks or tools. Regularly check to see if handrails are loose or broken. Make sure that both sides of any steps have handrails. Any raised decks and porches should have guardrails on the edges. Have any leaves, snow, or ice cleared regularly. Use sand or salt on walking paths during winter. Clean up any spills in your garage right away. This includes oil or grease spills. What can I do in the bathroom? Use night lights. Install grab bars by the toilet and in the tub and shower. Do not use towel bars as grab bars. Use non-skid mats or decals in the tub or shower. If you need to sit down in the shower, use a plastic, non-slip stool. Keep the floor dry. Clean up any water that spills on the floor as soon as it happens. Remove soap buildup in the tub or shower regularly. Attach bath mats securely with double-sided non-slip rug tape. Do not have throw rugs and other things on the floor that can make you trip. What can I do in the bedroom? Use night lights. Make sure that you have a  light by your bed that is easy to reach. Do not use any sheets or blankets that are too big for your bed. They should not hang down onto the floor. Have a firm chair that has side arms. You can use this for support while you get dressed. Do not have throw rugs and other things on the floor that can make you trip. What can I do in the kitchen? Clean up any spills right away. Avoid walking on wet floors. Keep items that you use a lot in easy-to-reach places. If you need to reach something above you, use a strong step stool that has a grab bar. Keep electrical cords out of the way. Do not use floor polish or wax that makes floors slippery. If you must use wax, use non-skid floor wax. Do not have throw rugs and other things on the floor that can make you trip. What can I do with my stairs? Do not leave any items on the stairs. Make sure that there are handrails on both sides of the stairs and use them. Fix handrails that are broken or loose. Make sure that handrails are as long as the stairways. Check any carpeting to make sure that it is firmly attached to the stairs. Fix any carpet that is loose or worn. Avoid having throw rugs at the top or bottom of the stairs. If you do have throw rugs,  attach them to the floor with carpet tape. Make sure that you have a light switch at the top of the stairs and the bottom of the stairs. If you do not have them, ask someone to add them for you. What else can I do to help prevent falls? Wear shoes that: Do not have high heels. Have rubber bottoms. Are comfortable and fit you well. Are closed at the toe. Do not wear sandals. If you use a stepladder: Make sure that it is fully opened. Do not climb a closed stepladder. Make sure that both sides of the stepladder are locked into place. Ask someone to hold it for you, if possible. Clearly mark and make sure that you can see: Any grab bars or handrails. First and last steps. Where the edge of each step  is. Use tools that help you move around (mobility aids) if they are needed. These include: Canes. Walkers. Scooters. Crutches. Turn on the lights when you go into a dark area. Replace any light bulbs as soon as they burn out. Set up your furniture so you have a clear path. Avoid moving your furniture around. If any of your floors are uneven, fix them. If there are any pets around you, be aware of where they are. Review your medicines with your doctor. Some medicines can make you feel dizzy. This can increase your chance of falling. Ask your doctor what other things that you can do to help prevent falls. This information is not intended to replace advice given to you by your health care provider. Make sure you discuss any questions you have with your health care provider. Document Released: 11/07/2008 Document Revised: 06/19/2015 Document Reviewed: 02/15/2014 Elsevier Interactive Patient Education  2017 [employer].

## 2021-12-15 DIAGNOSIS — E785 Hyperlipidemia, unspecified: Secondary | ICD-10-CM | POA: Diagnosis not present

## 2021-12-15 DIAGNOSIS — N130 Hydronephrosis with ureteropelvic junction obstruction: Secondary | ICD-10-CM | POA: Diagnosis not present

## 2021-12-15 DIAGNOSIS — J4530 Mild persistent asthma, uncomplicated: Secondary | ICD-10-CM | POA: Diagnosis not present

## 2021-12-15 DIAGNOSIS — N281 Cyst of kidney, acquired: Secondary | ICD-10-CM | POA: Diagnosis not present

## 2021-12-15 DIAGNOSIS — I10 Essential (primary) hypertension: Secondary | ICD-10-CM | POA: Diagnosis not present

## 2021-12-23 ENCOUNTER — Other Ambulatory Visit: Payer: Self-pay | Admitting: Family Medicine

## 2021-12-23 DIAGNOSIS — N281 Cyst of kidney, acquired: Secondary | ICD-10-CM | POA: Diagnosis not present

## 2021-12-23 DIAGNOSIS — N130 Hydronephrosis with ureteropelvic junction obstruction: Secondary | ICD-10-CM | POA: Diagnosis not present

## 2021-12-23 DIAGNOSIS — J4530 Mild persistent asthma, uncomplicated: Secondary | ICD-10-CM | POA: Diagnosis not present

## 2021-12-23 DIAGNOSIS — E785 Hyperlipidemia, unspecified: Secondary | ICD-10-CM | POA: Diagnosis not present

## 2021-12-23 DIAGNOSIS — K219 Gastro-esophageal reflux disease without esophagitis: Secondary | ICD-10-CM | POA: Diagnosis not present

## 2021-12-23 DIAGNOSIS — N1832 Chronic kidney disease, stage 3b: Secondary | ICD-10-CM | POA: Diagnosis not present

## 2021-12-23 DIAGNOSIS — I10 Essential (primary) hypertension: Secondary | ICD-10-CM | POA: Diagnosis not present

## 2021-12-23 DIAGNOSIS — N200 Calculus of kidney: Secondary | ICD-10-CM | POA: Diagnosis not present

## 2021-12-25 MED ORDER — CLONAZEPAM 0.5 MG PO TABS: 0.2500 mg | ORAL_TABLET | Freq: Two times a day (BID) | ORAL | 0 refills | Status: DC | PRN

## 2022-01-23 ENCOUNTER — Other Ambulatory Visit: Payer: Self-pay | Admitting: Family Medicine

## 2022-01-23 DIAGNOSIS — J4520 Mild intermittent asthma, uncomplicated: Secondary | ICD-10-CM

## 2022-01-24 NOTE — Telephone Encounter (Signed)
Requested Prescriptions  Pending Prescriptions Disp Refills   fluticasone furoate-vilanterol (BREO ELLIPTA) 200-25 MCG/ACT AEPB [Pharmacy Med Name: BREO ELLIPTA 200-25 MCG INHALR] 180 each 3    Sig: INHALE 1 PUFF BY MOUTH EVERY DAY     Pulmonology:  Combination Products Passed - 01/23/2022  9:14 AM      Passed - Valid encounter within last 12 months    Recent Outpatient Visits           5 months ago Essential (primary) hypertension   [facility] Jacky Kindle, FNP   11 months ago Encounter for annual physical exam   [facility], Marzella Schlein, MD   1 year ago GAD (generalized anxiety disorder)   [facility], Marzella Schlein, MD   2 years ago Complicated grief   [facility], Marzella Schlein, MD   2 years ago Mild intermittent extrinsic asthma without complication   [facility] [place], Alessandra Bevels, [place]       Future Appointments             In 3 months Kirke Corin, Chelsea Aus, MD [facility]

## 2022-02-10 ENCOUNTER — Other Ambulatory Visit: Payer: Self-pay | Admitting: Family Medicine

## 2022-03-18 IMAGING — MG MM DIGITAL SCREENING BILAT W/ TOMO AND CAD
8 series · 8 of 24 positions shown · non-contrast
Comparison: Previous exam(s).

CLINICAL DATA: Screening.

EXAM:
DIGITAL SCREENING BILATERAL MAMMOGRAM WITH TOMOSYNTHESIS AND CAD
TECHNIQUE: Bilateral screening digital craniocaudal and mediolateral oblique
mammograms were obtained. Bilateral screening digital breast
tomosynthesis was performed. The images were evaluated with
computer-aided detection.

[L CC synth-2D]
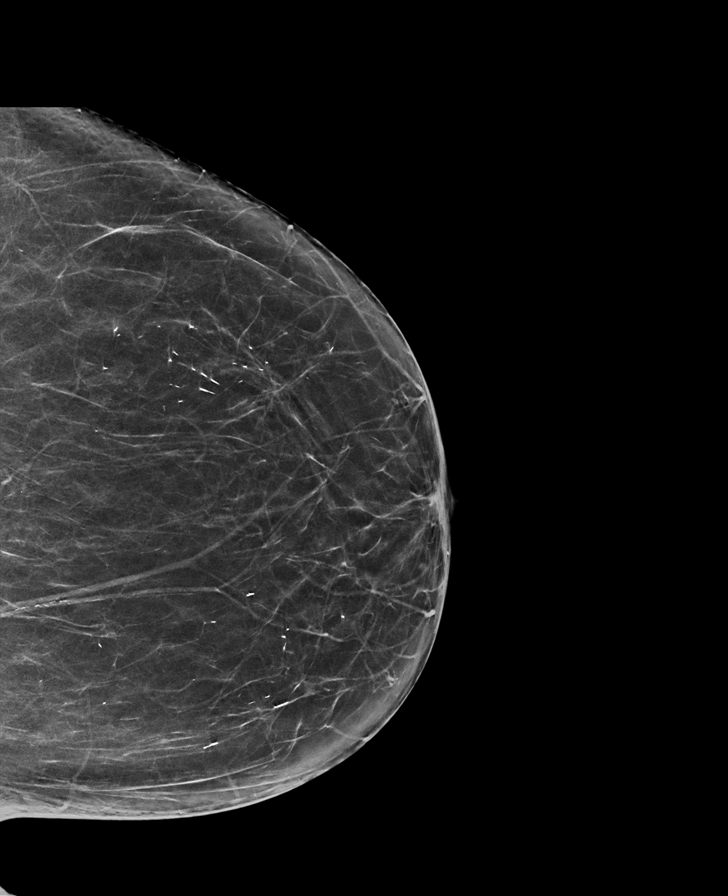

[R CC synth-2D]
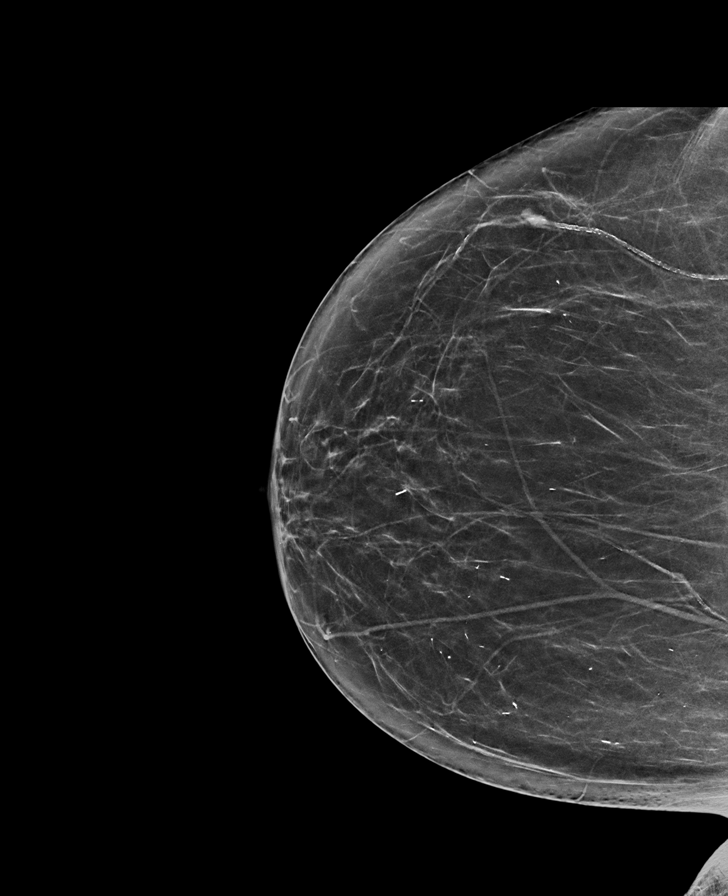

[R MLO synth-2D]
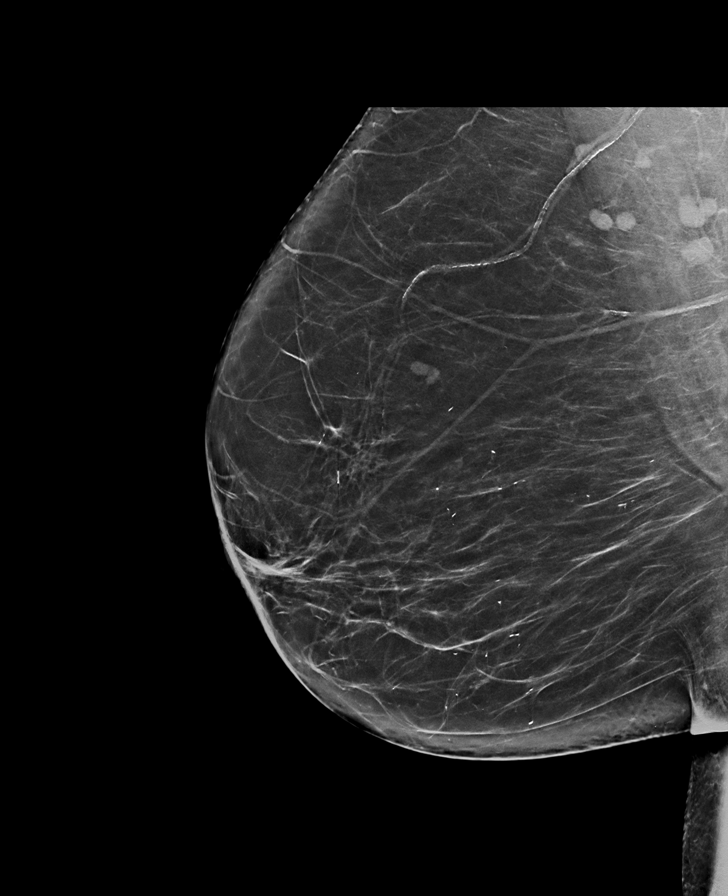

[L MLO synth-2D]
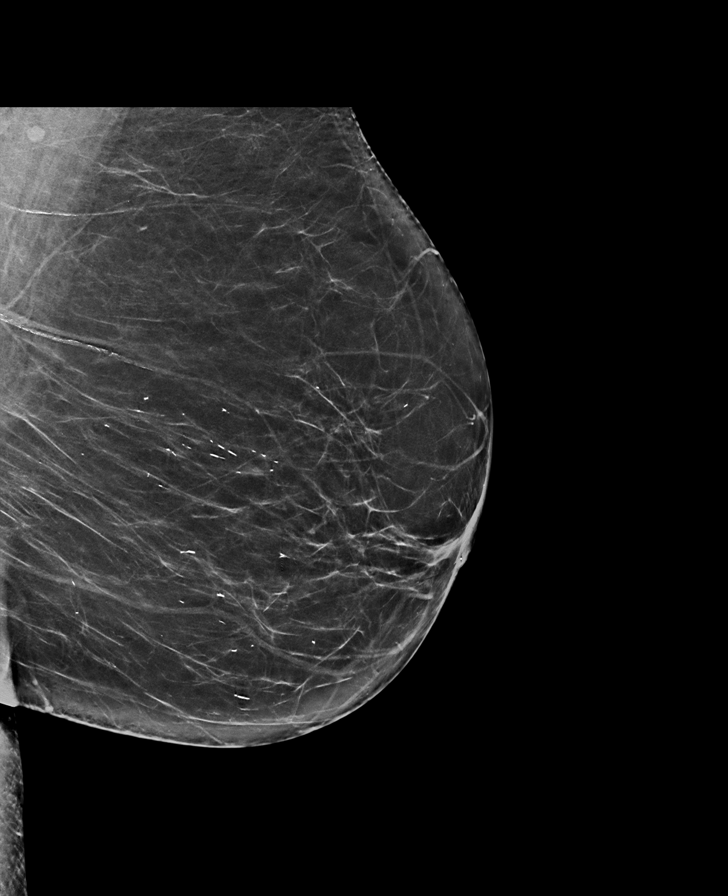

[R MLO tomo · tomo slice 38/75.0]
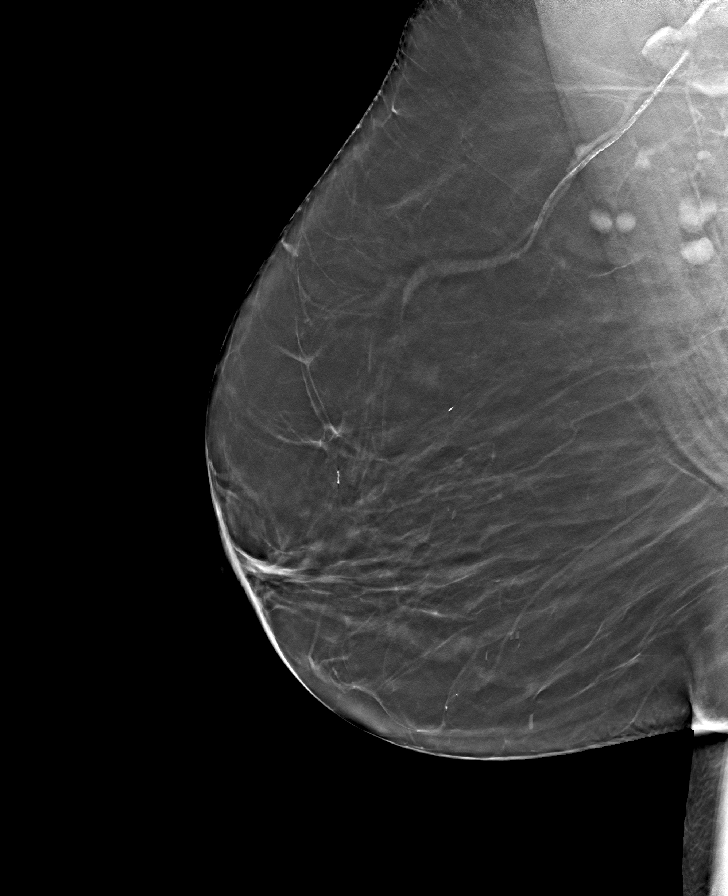

[L MLO tomo · tomo slice 37/73.0]
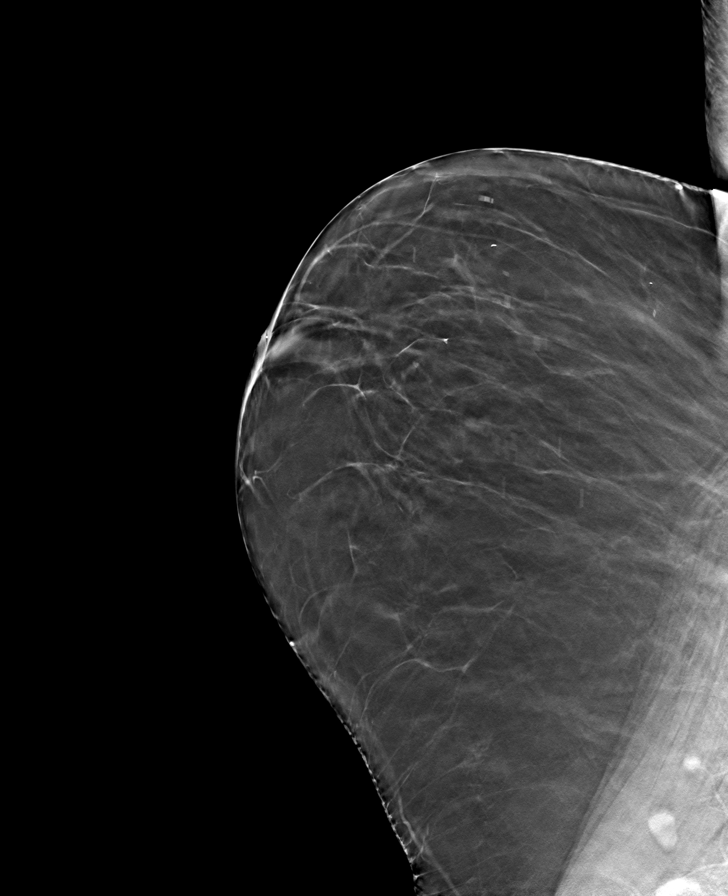

[R CC tomo · tomo slice 33/66.0]
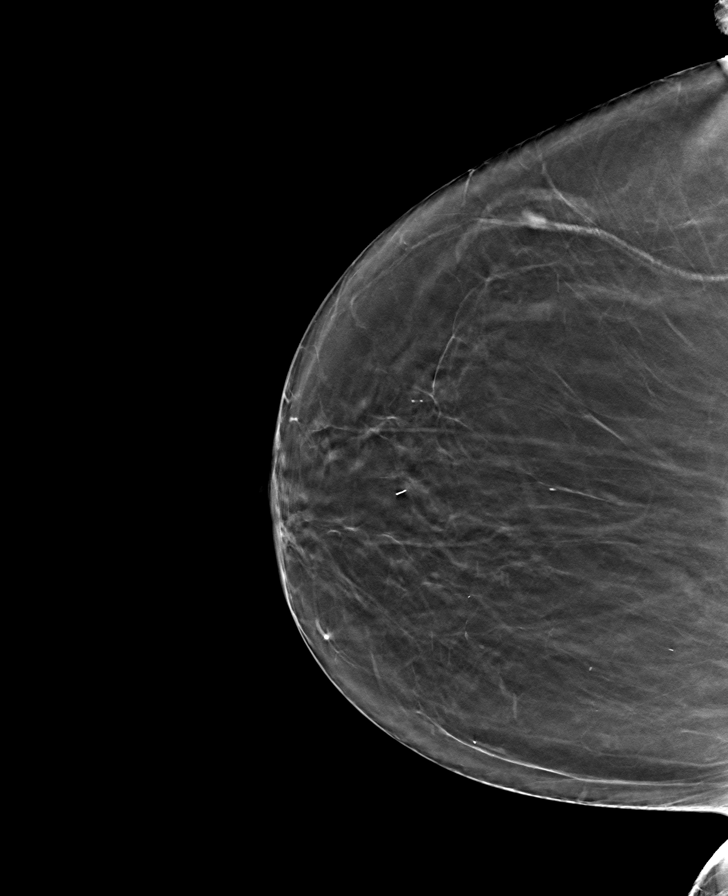

[L CC tomo · tomo slice 33/66.0]
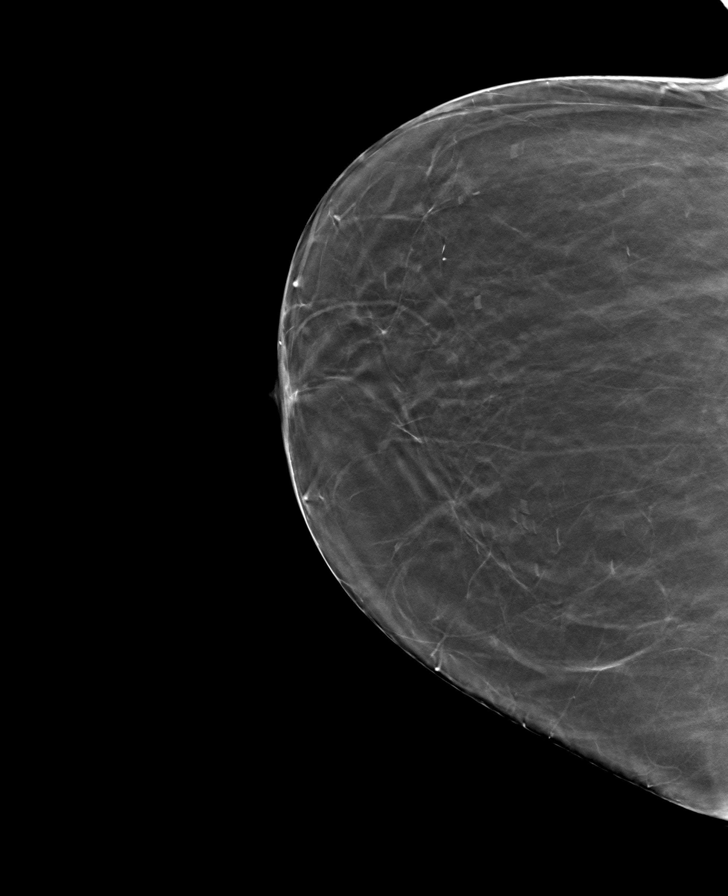

[8 of 24 positions shown; findings below may reference images not displayed]

ACR Breast Density Category b: There are scattered areas of
fibroglandular density.
FINDINGS: There are no findings suspicious for malignancy.
IMPRESSION: No mammographic evidence of malignancy. A result letter of this
screening mammogram will be mailed directly to the patient.

RECOMMENDATION:
Screening mammogram in one year. (Code:51-O-LD2)

BI-RADS CATEGORY  1: Negative.

## 2022-03-24 ENCOUNTER — Other Ambulatory Visit: Payer: Self-pay | Admitting: Family Medicine

## 2022-03-24 DIAGNOSIS — I10 Essential (primary) hypertension: Secondary | ICD-10-CM

## 2022-04-05 ENCOUNTER — Ambulatory Visit: Payer: Self-pay

## 2022-04-05 NOTE — Telephone Encounter (Signed)
  Chief Complaint: right thigh to knee pain (medial side) Symptoms: moderate pain that comes and goes, difficulty lifting leg, occasional SOB,back pain Frequency: 1 month Pertinent Negatives: Patient denies chest pain,  Disposition: [] ED /[] Urgent Care (no appt availability in office) / [x] Appointment(In office/virtual)/ []  [place] [facility]/ [] Home Care/ [] Refused Recommended Disposition /[] [place] [facility]/ []  Follow-up with PCP Additional Notes: appt wit PCP tomorrow- pt does not have transortation Reason for Disposition  [1] Thigh or calf pain AND [2] only 1 side AND [3] present > 1 hour (Exception: Chronic unchanged pain.)  Answer Assessment - Initial Assessment Questions 1. ONSET: "When did the pain start?"      Right leg medial  2. LOCATION: "Where is the pain located?"      Walking shoots to knee from upper thight 3. PAIN: "How bad is the pain?"    (Scale 1-10; or mild, moderate, severe)   -  MILD (1-3): doesn't interfere with normal activities    -  MODERATE (4-7): interferes with normal activities (e.g., work or school) or awakens from sleep, limping    -  SEVERE (8-10): excruciating pain, unable to do any normal activities, unable to walk     Moderate  4. WORK OR EXERCISE: "Has there been any recent work or exercise that involved this part of the body?"      Standing  5. CAUSE: "What do you think is causing the leg pain?"     Unsure  6. OTHER SYMPTOMS: "Do you have any other symptoms?" (e.g., chest pain, back pain, breathing difficulty, swelling, rash, fever, numbness, weakness)     Lifting hurts it - occasional SOB  7. PREGNANCY: "Is there any chance you are pregnant?" "When was your last menstrual period?"     N/a  Protocols used: Leg Pain-A-AH

## 2022-04-05 NOTE — Progress Notes (Unsigned)
I,Sulibeya S Dimas,acting as a scribe for Lavon Paganini, MD.,have documented all relevant documentation on the behalf of Lavon Paganini, MD,as directed by  Lavon Paganini, MD while in the presence of Lavon Paganini, MD.   Established patient visit   Patient: Whitney Stuart   DOB: February 06, 1950   72 y.o. Female  MRN: [id number] Visit Date: 04/06/2022  Today's healthcare provider: Lavon Paganini, MD   Chief Complaint  Patient presents with   Leg Pain   Anxiety   Subjective    Leg Pain  Incident onset: 1 month. There was no injury mechanism. Pain location: right thigh to knee pain (medial side) The pain is moderate. The pain has been Constant since onset. Associated symptoms include an inability to bear weight, muscle weakness, numbness and tingling. Pertinent negatives include no loss of motion or loss of sensation. The symptoms are aggravated by movement and weight bearing. She has tried heat, ice, NSAIDs and acetaminophen for the symptoms. The treatment provided no relief.   started after carrying an air conditioner   Anxiety, Follow-up  She was last seen for anxiety 1 years ago. Changes made at last visit include no changes.   She reports excellent compliance with treatment. She reports excellent tolerance of treatment. She is not having side effects.   She feels her anxiety is fluctuate and Unchanged since last visit. She reports shortness of breath is worse with anxiety. She reports pain in leg is causing more anxiety.  She reports taking clonazepam 0.5 mg 3-4 times a week. And takes sertraline daily.   GAD-7 Results    04/06/2022    1:33 PM 08/17/2021    1:06 PM 10/25/2019   10:54 AM  GAD-7 Generalized Anxiety Disorder Screening Tool  1. Feeling Nervous, Anxious, or on Edge 3 0 0  2. Not Being Able to Stop or Control Worrying 0 0 0  3. Worrying Too Much About Different Things 0 0 0  4. Trouble Relaxing '1 1 1  '$ 5. Being So Restless it's Hard To Sit Still  0 0 0  6. Becoming Easily Annoyed or Irritable 0 0 0  7. Feeling Afraid As If Something Awful Might Happen 0 0 0  Total GAD-7 Score '4 1 1  '$ Difficulty At Work, Home, or Getting  Along With Others? Not difficult at all Not difficult at all Not difficult at all    PHQ-9 Scores    04/06/2022    1:33 PM 12/09/2021    2:12 PM 08/17/2021    1:07 PM  PHQ9 SCORE ONLY  PHQ-9 Total Score 8 0 8    ---------------------------------------------------------------------------------------------------   Medications: Outpatient Medications Prior to Visit  Medication Sig   albuterol (VENTOLIN HFA) 108 (90 Base) MCG/ACT inhaler Inhale 2 puffs into the lungs every 6 (six) hours as needed for wheezing or shortness of breath.   amLODipine (NORVASC) 10 MG tablet TAKE 1 TABLET BY MOUTH EVERY DAY   aspirin 81 MG tablet Take 81 mg by mouth daily.   cyclobenzaprine (FLEXERIL) 5 MG tablet TAKE 1 TABLET BY MOUTH EVERYDAY AT BEDTIME   fluticasone furoate-vilanterol (BREO ELLIPTA) 200-25 MCG/ACT AEPB INHALE 1 PUFF BY MOUTH EVERY DAY   lisinopril (ZESTRIL) 10 MG tablet Take 1 tablet (10 mg total) by mouth daily.   loratadine (CLARITIN) 10 MG tablet Take 10 mg by mouth daily.   ondansetron (ZOFRAN) 4 MG tablet Take 1 tablet (4 mg total) by mouth every 8 (eight) hours as needed for nausea or vomiting.  rosuvastatin (CRESTOR) 5 MG tablet TAKE 1 TABLET (5 MG TOTAL) BY MOUTH DAILY. STOP ATORVASTATIN   [DISCONTINUED] clonazePAM (KLONOPIN) 0.5 MG tablet Take 0.5-1 tablets (0.25-0.5 mg total) by mouth 2 (two) times daily as needed for anxiety.   [DISCONTINUED] sertraline (ZOLOFT) 50 MG tablet Take 1 tablet (50 mg total) by mouth every other day. Please schedule office visit before any future refills   No facility-administered medications prior to visit.    Review of Systems  Constitutional:  Positive for fatigue. Negative for chills and fever.  HENT:  Negative for congestion.   Respiratory:  Positive for shortness of  breath. Negative for cough and wheezing.   Gastrointestinal:  Negative for abdominal pain, nausea and vomiting.  Neurological:  Positive for tingling, weakness and numbness.  Psychiatric/Behavioral:  The patient is nervous/anxious.        Objective    BP 113/71 (BP Location: Left Arm, Patient Position: Sitting, Cuff Size: Large)   Pulse 71   Temp 98 F (36.7 C) (Oral)   Resp 12   Wt 193 lb (87.5 kg)   SpO2 99%   BMI 30.23 kg/m    Physical Exam Constitutional:      General: She is not in acute distress.    Appearance: Normal appearance.  HENT:     Head: Normocephalic.  Cardiovascular:     Rate and Rhythm: Normal rate.  Pulmonary:     Effort: Pulmonary effort is normal. No respiratory distress.  Musculoskeletal:     Right lower leg: No edema.     Left lower leg: No edema.     Comments: No TTP over R thigh. Pain with hip flexion. No TTP over bony landmarks of R knee or hip  Skin:    General: Skin is warm and dry.     Findings: No rash.  Neurological:     Mental Status: She is alert and oriented to person, place, and time. Mental status is at baseline.       No results found for any visits on 04/06/22.  Assessment & Plan     Problem List Items Addressed This Visit       Other   GAD (generalized anxiety disorder) - Primary    Chronic In general, well controlled Exacerbated by pain and limitations in movement currently Continue zoloft at current dose Ok to continue klonopin very sparingly (Rx for #15 typically lasts her at least 3 months)      Relevant Medications   sertraline (ZOLOFT) 50 MG tablet   Pain of right thigh    New problem Seems to have quad strain after lifting something heavy Discussed rest, ice Ok to use NSAID sparingly (caution with CKD) If not improving, consider PT vs sports med referral      Other Visit Diagnoses     Postmenopausal       Relevant Orders   DG Bone Density   Breast cancer screening by mammogram       Relevant  Orders   MM 3D SCREENING MAMMOGRAM BILATERAL BREAST        Return in about 2 months (around 06/06/2022) for CPE.      I, Lavon Paganini, MD, have reviewed all documentation for this visit. The documentation on 04/06/22 for the exam, diagnosis, procedures, and orders are all accurate and complete.   Bacigalupo, Dionne Bucy, MD, MPH [facility]

## 2022-04-06 ENCOUNTER — Ambulatory Visit (INDEPENDENT_AMBULATORY_CARE_PROVIDER_SITE_OTHER): Payer: 59 | Admitting: Family Medicine

## 2022-04-06 ENCOUNTER — Other Ambulatory Visit: Payer: Self-pay | Admitting: Family Medicine

## 2022-04-06 ENCOUNTER — Encounter: Payer: Self-pay | Admitting: Family Medicine

## 2022-04-06 VITALS — BP 113/71 | HR 71 | Temp 98.0°F | Resp 12 | Wt 193.0 lb

## 2022-04-06 DIAGNOSIS — Z780 Asymptomatic menopausal state: Secondary | ICD-10-CM

## 2022-04-06 DIAGNOSIS — F411 Generalized anxiety disorder: Secondary | ICD-10-CM | POA: Diagnosis not present

## 2022-04-06 DIAGNOSIS — Z1231 Encounter for screening mammogram for malignant neoplasm of breast: Secondary | ICD-10-CM | POA: Diagnosis not present

## 2022-04-06 DIAGNOSIS — M79651 Pain in right thigh: Secondary | ICD-10-CM | POA: Diagnosis not present

## 2022-04-06 MED ORDER — SERTRALINE HCL 50 MG PO TABS: 50.0000 mg | ORAL_TABLET | ORAL | 3 refills | Status: DC

## 2022-04-06 MED ORDER — MELOXICAM 7.5 MG PO TABS: 7.5000 mg | ORAL_TABLET | Freq: Every day | ORAL | 0 refills | Status: DC | PRN

## 2022-04-06 MED ORDER — CLONAZEPAM 0.5 MG PO TABS: 0.2500 mg | ORAL_TABLET | Freq: Two times a day (BID) | ORAL | 0 refills | Status: DC | PRN

## 2022-04-06 NOTE — Assessment & Plan Note (Signed)
New problem Seems to have quad strain after lifting something heavy Discussed rest, ice Ok to use NSAID sparingly (caution with CKD) If not improving, consider PT vs sports med referral

## 2022-04-06 NOTE — Assessment & Plan Note (Signed)
Chronic In general, well controlled Exacerbated by pain and limitations in movement currently Continue zoloft at current dose Ok to continue klonopin very sparingly (Rx for #15 typically lasts her at least 3 months)

## 2022-04-11 ENCOUNTER — Other Ambulatory Visit: Payer: Self-pay | Admitting: Family Medicine

## 2022-04-11 ENCOUNTER — Other Ambulatory Visit: Payer: Self-pay | Admitting: Physician Assistant

## 2022-04-11 DIAGNOSIS — I10 Essential (primary) hypertension: Secondary | ICD-10-CM

## 2022-04-11 DIAGNOSIS — E782 Mixed hyperlipidemia: Secondary | ICD-10-CM

## 2022-04-12 NOTE — Telephone Encounter (Signed)
Requested Prescriptions  Pending Prescriptions Disp Refills   rosuvastatin (CRESTOR) 5 MG tablet [Pharmacy Med Name: ROSUVASTATIN CALCIUM 5 MG TAB] 90 tablet 0    Sig: TAKE 1 TABLET (5 MG TOTAL) BY MOUTH DAILY. STOP ATORVASTATIN     Cardiovascular:  Antilipid - Statins 2 Failed - 04/11/2022  8:44 AM      Failed - Cr in normal range and within 360 days    Creatinine, Ser  Date Value Ref Range Status  11/04/2021 1.27 (H) 0.57 - 1.00 mg/dL Final         Failed - Lipid Panel in normal range within the last 12 months    Cholesterol, Total  Date Value Ref Range Status  08/17/2021 160 100 - 199 mg/dL Final   LDL Chol Calc (NIH)  Date Value Ref Range Status  08/17/2021 86 0 - 99 mg/dL Final   HDL  Date Value Ref Range Status  08/17/2021 60 >39 mg/dL Final   Triglycerides  Date Value Ref Range Status  08/17/2021 72 0 - 149 mg/dL Final         Passed - Patient is not pregnant      Passed - Valid encounter within last 12 months    Recent Outpatient Visits           6 days ago GAD (generalized anxiety disorder)   [place], Dionne Bucy, MD   7 months ago Essential (primary) hypertension   [place] Tally Joe T, FNP   1 year ago Encounter for annual physical exam   [place], Dionne Bucy, MD   1 year ago GAD (generalized anxiety disorder)   [place], Dionne Bucy, MD   2 years ago Complicated grief   [place], Dionne Bucy, MD       Future Appointments             In 1 month Arida, Mertie Clause, MD [place] [facility] [place]   In 3 months Bacigalupo, Dionne Bucy, MD [facility], [place]

## 2022-04-17 ENCOUNTER — Other Ambulatory Visit: Payer: Self-pay | Admitting: Family Medicine

## 2022-04-19 NOTE — Telephone Encounter (Signed)
Requested medications are due for refill today.  Provider to determine  Requested medications are on the active medications list.  yes  Last refill. 02/10/2022 #90 [id number]  Future visit scheduled.   yes  Notes to clinic.  Refill not delegated.    Requested Prescriptions  Pending Prescriptions Disp Refills   cyclobenzaprine (FLEXERIL) 5 MG tablet [Pharmacy Med Name: CYCLOBENZAPRINE 5 MG TABLET] 90 tablet 0    Sig: TAKE 1 TABLET BY MOUTH EVERYDAY AT BEDTIME     Not Delegated - Analgesics:  Muscle Relaxants Failed - 04/17/2022  2:34 PM      Failed - This refill cannot be delegated      Passed - Valid encounter within last 6 months    Recent Outpatient Visits           1 week ago GAD (generalized anxiety disorder)   [place], Dionne Bucy, MD   8 months ago Essential (primary) hypertension   [place] Tally Joe T, FNP   1 year ago Encounter for annual physical exam   [place] Bacigalupo, Dionne Bucy, MD   1 year ago GAD (generalized anxiety disorder)   [place] [place], Dionne Bucy, MD   2 years ago Complicated grief   [place], Dionne Bucy, MD       Future Appointments             In 1 month Arida, Mertie Clause, MD [place] [facility] [place]   In 3 months Bacigalupo, Dionne Bucy, MD [facility] [place]

## 2022-04-22 ENCOUNTER — Other Ambulatory Visit: Payer: Self-pay | Admitting: Family Medicine

## 2022-04-27 ENCOUNTER — Other Ambulatory Visit: Payer: Self-pay | Admitting: Family Medicine

## 2022-04-27 DIAGNOSIS — J4520 Mild intermittent asthma, uncomplicated: Secondary | ICD-10-CM

## 2022-05-11 ENCOUNTER — Ambulatory Visit
Admission: RE | Admit: 2022-05-11 | Discharge: 2022-05-11 | Disposition: A | Discharge status: Home / Self Care | Payer: 59 | Source: Ambulatory Visit | Attending: Family Medicine | Admitting: Family Medicine

## 2022-05-11 DIAGNOSIS — Z780 Asymptomatic menopausal state: Secondary | ICD-10-CM

## 2022-05-11 DIAGNOSIS — Z1231 Encounter for screening mammogram for malignant neoplasm of breast: Secondary | ICD-10-CM | POA: Diagnosis not present

## 2022-05-11 DIAGNOSIS — M85852 Other specified disorders of bone density and structure, left thigh: Secondary | ICD-10-CM | POA: Diagnosis not present

## 2022-05-12 ENCOUNTER — Ambulatory Visit: Payer: Self-pay

## 2022-05-12 DIAGNOSIS — M79651 Pain in right thigh: Secondary | ICD-10-CM

## 2022-05-12 NOTE — Telephone Encounter (Signed)
Patient currently taking meloxicam (MOBIC) 7.5 MG tablet, for a month but it is not working, especially at night. Patient had seen dr March 12 regarding this matter  Please advise     Chief Complaint: Right thigh pain that goes down to knee. Hurts worse with certalin movements. Keeps her up at night. Meloxicam not helping. Asking for advice. Symptoms: Above Frequency: 2 months ago Pertinent Negatives: Patient denies  Disposition: ED /[] Urgent Care (no appt availability in office) / Appointment(In office/virtual)/  [place] [facility]/ Home Care/ Refused Recommended Disposition /[] [place]/  Follow-up with PCP Additional Notes: Please advise pt.  Answer Assessment - Initial Assessment Questions 1. ONSET: "When did the pain start?"      2 months ago 2. LOCATION: "Where is the pain located?"      Right thigh down to knee 3. PAIN: "How bad is the pain?"    (Scale 1-10; or mild, moderate, severe)   -  MILD (1-3): doesn't interfere with normal activities    -  MODERATE (4-7): interferes with normal activities (e.g., work or school) or awakens from sleep, limping    -  SEVERE (8-10): excruciating pain, unable to do any normal activities, unable to walk     Can be severe 4. WORK OR EXERCISE: "Has there been any recent work or exercise that involved this part of the body?"      No 5. CAUSE: "What do you think is causing the leg pain?"     Unsure 6. OTHER SYMPTOMS: "Do you have any other symptoms?" (e.g., chest pain, back pain, breathing difficulty, swelling, rash, fever, numbness, weakness)     No 7. PREGNANCY: "Is there any chance you are pregnant?" "When was your last menstrual period?"     No  Protocols used: Leg Pain-A-AH

## 2022-05-13 NOTE — Telephone Encounter (Signed)
Ok to refer to Ortho.  

## 2022-05-13 NOTE — Addendum Note (Signed)
Addended by: Lily Kocher on: 05/13/2022 08:18 AM   Modules accepted: Orders

## 2022-05-13 NOTE — Telephone Encounter (Signed)
Patient advised verbalized understanding 

## 2022-05-18 DIAGNOSIS — M79651 Pain in right thigh: Secondary | ICD-10-CM | POA: Diagnosis not present

## 2022-05-18 DIAGNOSIS — M1611 Unilateral primary osteoarthritis, right hip: Secondary | ICD-10-CM | POA: Diagnosis not present

## 2022-05-18 DIAGNOSIS — M25551 Pain in right hip: Secondary | ICD-10-CM | POA: Diagnosis not present

## 2022-05-20 ENCOUNTER — Encounter: Payer: Self-pay | Admitting: Cardiovascular Disease

## 2022-05-20 ENCOUNTER — Other Ambulatory Visit: Payer: Self-pay | Admitting: Family Medicine

## 2022-05-20 ENCOUNTER — Ambulatory Visit: Payer: 59

## 2022-05-20 ENCOUNTER — Ambulatory Visit: Payer: 59 | Attending: Cardiovascular Disease | Admitting: Cardiovascular Disease

## 2022-05-20 VITALS — BP 102/62 | HR 150 | Ht 67.0 in | Wt 189.2 lb

## 2022-05-20 DIAGNOSIS — I480 Paroxysmal atrial fibrillation: Secondary | ICD-10-CM

## 2022-05-20 DIAGNOSIS — I10 Essential (primary) hypertension: Secondary | ICD-10-CM | POA: Diagnosis not present

## 2022-05-20 DIAGNOSIS — E785 Hyperlipidemia, unspecified: Secondary | ICD-10-CM

## 2022-05-20 DIAGNOSIS — I340 Nonrheumatic mitral (valve) insufficiency: Secondary | ICD-10-CM

## 2022-05-20 DIAGNOSIS — J4520 Mild intermittent asthma, uncomplicated: Secondary | ICD-10-CM

## 2022-05-20 DIAGNOSIS — E782 Mixed hyperlipidemia: Secondary | ICD-10-CM

## 2022-05-20 LAB — ECHOCARDIOGRAM COMPLETE: Weight: 3028 oz

## 2022-05-20 MED ORDER — APIXABAN 5 MG PO TABS: 5.0000 mg | ORAL_TABLET | Freq: Two times a day (BID) | ORAL | 11 refills | Status: DC

## 2022-05-20 MED ORDER — METOPROLOL TARTRATE 25 MG PO TABS: 25.0000 mg | ORAL_TABLET | Freq: Two times a day (BID) | ORAL | 3 refills | Status: DC

## 2022-05-20 NOTE — Patient Instructions (Signed)
Medication Instructions:  STOP the Amlodipine STOP the Aspirin  START Metoprolol Tartrate 25 mg twice daily START Eliquis 5 mg twice daily *If you need a refill on your cardiac medications before your next appointment, please call your pharmacy*   Lab Work: None ordered If you have labs (blood work) drawn today and your tests are completely normal, you will receive your results only by: MyChart Message (if you have MyChart) OR A paper copy in the mail If you have any lab test that is abnormal or we need to change your treatment, we will call you to review the results.   Testing/Procedures: Your physician has requested that you have an echocardiogram. Echocardiography is a painless test that uses sound waves to create images of your heart. It provides your doctor with information about the size and shape of your heart and how well your heart's chambers and valves are working.   You may receive an ultrasound enhancing agent through an IV if needed to better visualize your heart during the echo. This procedure takes approximately one hour.  There are no restrictions for this procedure.  This will take place at [address] [address] [facility], [place] [address]    Follow-Up: At [facility], you and your health needs are our priority.  As part of our continuing mission to provide you with exceptional heart care, we have created designated Provider Care Teams.  These Care Teams include your primary Cardiologist (physician) and Advanced Practice Providers (APPs -  Physician Assistants and Nurse Practitioners) who all work together to provide you with the care you need, when you need it.  We recommend signing up for the patient portal called "MyChart".  Sign up information is provided on this After Visit Summary.  MyChart is used to connect with patients for Virtual Visits (Telemedicine).  Patients are able to view lab/test results, encounter notes, upcoming  appointments, etc.  Non-urgent messages can be sent to your provider as well.   To learn more about what you can do with MyChart, go to [email or web address].    Your next appointment:   1 month(s)  Provider:   You will see one of the following Advanced Practice Providers on your designated Care Team:   Nicolasa Ducking, NP Eula Listen, PA-C Cadence Fransico Michael, PA-C Charlsie Quest, NP

## 2022-05-20 NOTE — Progress Notes (Signed)
Cardiology Office Note   Date:  05/20/2022   ID:  Whitney Stuart, DOB [birth year], MRN [id number]  PCP:  Erasmo Downer, MD  Cardiologist:   Lorine Bears, MD   Chief Complaint  Patient presents with   Follow-up    12 month f/u c/o sob and rapid heart beat since starting Prednisone. Meds reviewed verbally with pt.      History of Present Illness: Whitney Stuart is a 72 y.o. female who is here today for follow-up visit regarding mitral regurgitation and recent tachycardia.    She has known history of essential hypertension, hyperlipidemia and secondhand smoking.  She is a lifelong non-smoker but has been exposed to secondhand smoking from her husband who smoked for many years.  She was evaluated in October 2021 for chest pain and shortness of breath.  Echocardiogram showed normal LV systolic function, grade 1 diastolic dysfunction, mild to moderate mitral regurgitation and mild to moderate pulmonary hypertension with peak systolic pressure of 47 mmHg.  Cardiac CTA showed a calcium score of 0 with normal coronary arteries. Home sleep study in June 2022 was normal.  She was recently started on steroids for right hip arthritis.  Shortly after that, she started having palpitations and tachycardia with increased shortness of breath.  She is noted to be in atrial fibrillation with RVR today.  No prior history of atrial fibrillation and no history of stroke.  She denies chest pain.   Past Medical History:  Diagnosis Date   Arthritis    Asthma    Asthma    Chest pain    a. 11/2019 Cor CTA: Calcium score equals 0.  Normal coronary arteries.   Diastolic dysfunction    a. 11/2019 Echo: EF 60-65%.  Grade 1 diastolic dysfunction.  No regional wall motion abnormalities.  RVSP 47 mmHg.  Moderately dilated left atrium.  Mild to moderate MR.   GERD (gastroesophageal reflux disease)    History of cervical cancer    Hyperlipidemia    Hypertension    Mitral regurgitation    PAH  (pulmonary artery hypertension)    a. 11/2019 Echo: RVSP .   Sleep-disordered breathing     Past Surgical History:  Procedure Laterality Date   ABDOMINAL HYSTERECTOMY  1999   total   HAND SURGERY Bilateral 2003   carpal tunnel   LIPOMA EXCISION       Current Outpatient Medications  Medication Sig Dispense Refill   albuterol (VENTOLIN HFA) 108 (90 Base) MCG/ACT inhaler Inhale 2 puffs into the lungs every 6 (six) hours as needed for wheezing or shortness of breath. 8.5 each 3   amLODipine (NORVASC) 10 MG tablet TAKE 1 TABLET BY MOUTH EVERY DAY 90 tablet 1   aspirin 81 MG tablet Take 81 mg by mouth daily.     celecoxib (CELEBREX) 200 MG capsule Take 200 mg by mouth daily.     clonazePAM (KLONOPIN) 0.5 MG tablet Take 0.5-1 tablets (0.25-0.5 mg total) by mouth 2 (two) times daily as needed for anxiety. 15 tablet 0   cyclobenzaprine (FLEXERIL) 5 MG tablet TAKE 1 TABLET BY MOUTH EVERYDAY AT BEDTIME 30 tablet 0   fluticasone furoate-vilanterol (BREO ELLIPTA) 200-25 MCG/ACT AEPB INHALE 1 PUFF BY MOUTH EVERY DAY 180 each 0   lisinopril (ZESTRIL) 10 MG tablet TAKE 1 TABLET BY MOUTH EVERY DAY 90 tablet 1   loratadine (CLARITIN) 10 MG tablet Take 10 mg by mouth daily.     methylPREDNISolone (MEDROL DOSEPAK) 4 MG TBPK  tablet Take 4 mg by mouth as directed.     rosuvastatin (CRESTOR) 5 MG tablet TAKE 1 TABLET (5 MG TOTAL) BY MOUTH DAILY. STOP ATORVASTATIN 90 tablet 0   sertraline (ZOLOFT) 50 MG tablet Take 1 tablet (50 mg total) by mouth every other day. 45 tablet 3   ondansetron (ZOFRAN) 4 MG tablet Take 1 tablet (4 mg total) by mouth every 8 (eight) hours as needed for nausea or vomiting. (Patient not taking: Reported on 05/20/2022) 20 tablet 0   No current facility-administered medications for this visit.    Allergies:   Patient has no known allergies.    Social History:  The patient  reports that she quit smoking about 25 years ago. Her smoking use included cigarettes. She has a 3.00  pack-year smoking history. She has never used smokeless tobacco. She reports that she does not drink alcohol and does not use drugs.   Family History:  The patient's family history includes Congestive Heart Failure in her mother; Heart disease in her brother and mother; Heart murmur in her sister; Hypertension in her brother, brother, brother, brother, brother, brother, brother, brother, father, mother, sister, sister, and sister; Kidney disease in her father; Lung cancer in her brother; Mental illness in her maternal aunt; Ovarian cancer in her sister; Sleep apnea in her brother; Stroke in her mother.    ROS:  Please see the history of present illness.   Otherwise, review of systems are positive for none.   All other systems are reviewed and negative.    PHYSICAL EXAM: VS:  BP 102/62 (BP Location: Left Arm, Patient Position: Sitting, Cuff Size: Large)   Pulse (!) 150   Ht  (1.702 m)   Wt 189 lb 4 oz (85.8 kg)   SpO2 98%   BMI 29.64 kg/m  , BMI Body mass index is 29.64 kg/m. GEN: Well nourished, well developed, in no acute distress  HEENT: normal  Neck: no JVD, carotid bruits, or masses Cardiac: Irregularly irregular and tachycardic; no  rubs, or gallops.  1/ 6 systolic murmur in the aortic area.  Trace bilateral leg edema. Respiratory:  clear to auscultation bilaterally, normal work of breathing GI: soft, nontender, nondistended, + BS MS: no deformity or atrophy  Skin: warm and dry, no rash Neuro:  Strength and sensation are intact Psych: euthymic mood, full affect   EKG:  EKG is ordered today. The ekg ordered today demonstrates atrial fibrillation with ventricular rate 150 bpm.  Nonspecific ST changes.   Recent Labs: 08/17/2021: ALT 15; Hemoglobin 13.5; Platelets 372; TSH 0.891 11/04/2021: BUN 14; Creatinine, Ser 1.27; Potassium 5.1; Sodium 138    Lipid Panel    Component Value Date/Time   CHOL 160 08/17/2021 1330   TRIG 72 08/17/2021 1330   HDL 60 08/17/2021 1330    CHOLHDL 2.7 08/17/2021 1330   LDLCALC 86 08/17/2021 1330      Wt Readings from Last 3 Encounters:  05/20/22 189 lb 4 oz (85.8 kg)  04/06/22 193 lb (87.5 kg)  12/09/21 192 lb 8 oz (87.3 kg)          11/22/2019   11:21 AM  PAD Screen  Previous PAD dx? No  Previous surgical procedure? No  Pain with walking? No  Feet/toe relief with dangling? No  Painful, non-healing ulcers? No  Extremities discolored? No      ASSESSMENT AND PLAN:  1.  Newly diagnosed atrial fibrillation with rapid ventricular response: Her symptoms include palpitations and increased shortness of  breath.  CHA2DS2-VASc score is 4.  Recommend stopping aspirin and starting Eliquis 5 mg twice daily.  I added metoprolol tartrate 25 mg twice daily for rate control.  If she remains in atrial fibrillation upon follow-up, recommend proceeding with cardioversion.  2.  Essential hypertension: Due to starting metoprolol and blood pressure being on the low side, I elected to discontinue amlodipine today.  3.  Hyperlipidemia: Currently on small dose atorvastatin 10 mg daily.    4.  Mild to moderate mitral regurgitation: Due to atrial fibrillation, I requested a follow-up echocardiogram    Disposition:   FU with APP in 1 month.  Signed,  Lorine Bears, MD  05/20/2022 9:25 AM    [facility]

## 2022-05-20 NOTE — Telephone Encounter (Signed)
Requested medications are due for refill today.  Provider to decide  Requested medications are on the active medications list.  yes  Last refill. 04/19/2022 #30 0 rf  Future visit scheduled.   yes  Notes to clinic.  Refill not delegated.    Requested Prescriptions  Pending Prescriptions Disp Refills   cyclobenzaprine (FLEXERIL) 5 MG tablet [Pharmacy Med Name: CYCLOBENZAPRINE 5 MG TABLET] 30 tablet 0    Sig: TAKE 1 TABLET BY MOUTH EVERYDAY AT BEDTIME     Not Delegated - Analgesics:  Muscle Relaxants Failed - 05/20/2022 12:10 PM      Failed - This refill cannot be delegated      Passed - Valid encounter within last 6 months    Recent Outpatient Visits           1 month ago GAD (generalized anxiety disorder)   [place] [facility], Marzella Schlein, MD   9 months ago Essential (primary) hypertension   [place] [facility] Merita Norton T, FNP   1 year ago Encounter for annual physical exam   [place] [facility] Beryle Flock, Marzella Schlein, MD   1 year ago GAD (generalized anxiety disorder)   [place] [facility] Beryle Flock, Marzella Schlein, MD   2 years ago Complicated grief   [place] [facility], Marzella Schlein, MD       Future Appointments             In 1 month Brion Aliment, Dois Davenport, NP [facility] [place]   In 2 months Bacigalupo, Marzella Schlein, MD [facility]

## 2022-06-01 ENCOUNTER — Ambulatory Visit: Payer: 59 | Attending: Cardiovascular Disease

## 2022-06-01 DIAGNOSIS — I340 Nonrheumatic mitral (valve) insufficiency: Secondary | ICD-10-CM | POA: Diagnosis not present

## 2022-06-01 LAB — ECHOCARDIOGRAM COMPLETE
AR max vel: 2.98 cm2
AV Area VTI: 2.92 cm2
AV Area mean vel: 2.88 cm2
AV Mean grad: 3 mmHg
AV Peak grad: 4.7 mmHg
Ao pk vel: 1.08 m/s
Area-P 1/2: 4.15 cm2
Calc EF: 64.4 %
S' Lateral: 2.6 cm
Single Plane A2C EF: 63 %
Single Plane A4C EF: 65.3 %

## 2022-06-08 ENCOUNTER — Telehealth: Payer: Self-pay | Admitting: Cardiovascular Disease

## 2022-06-08 NOTE — Telephone Encounter (Signed)
Pt c/o medication issue:  1. Name of Medication: metoprolol tartrate (LOPRESSOR) 25 MG tablet   2. How are you currently taking this medication (dosage and times per day)?   Take 1 tablet (25 mg total) by mouth 2 (two) times daily.    3. Are you having a reaction (difficulty breathing--STAT)? No  4. What is your medication issue? Pt would like to know if provider wants her to continue taking medication. Please advise

## 2022-06-08 NOTE — Telephone Encounter (Signed)
Patient has been made aware to keep on the Metoprolol. She has a follow up on 5/30.

## 2022-06-09 DIAGNOSIS — M25551 Pain in right hip: Secondary | ICD-10-CM | POA: Diagnosis not present

## 2022-06-22 DIAGNOSIS — M25551 Pain in right hip: Secondary | ICD-10-CM | POA: Diagnosis not present

## 2022-06-23 DIAGNOSIS — I10 Essential (primary) hypertension: Secondary | ICD-10-CM | POA: Diagnosis not present

## 2022-06-23 DIAGNOSIS — N281 Cyst of kidney, acquired: Secondary | ICD-10-CM | POA: Diagnosis not present

## 2022-06-23 DIAGNOSIS — N130 Hydronephrosis with ureteropelvic junction obstruction: Secondary | ICD-10-CM | POA: Diagnosis not present

## 2022-06-23 DIAGNOSIS — N200 Calculus of kidney: Secondary | ICD-10-CM | POA: Diagnosis not present

## 2022-06-23 DIAGNOSIS — J4530 Mild persistent asthma, uncomplicated: Secondary | ICD-10-CM | POA: Diagnosis not present

## 2022-06-24 ENCOUNTER — Encounter: Payer: Self-pay | Admitting: Nurse Practitioner

## 2022-06-24 ENCOUNTER — Ambulatory Visit: Payer: 59

## 2022-06-24 ENCOUNTER — Ambulatory Visit: Payer: 59 | Attending: Nurse Practitioner | Admitting: Nurse Practitioner

## 2022-06-24 VITALS — BP 156/86 | HR 44 | Ht 64.0 in | Wt 188.4 lb

## 2022-06-24 DIAGNOSIS — I10 Essential (primary) hypertension: Secondary | ICD-10-CM | POA: Diagnosis not present

## 2022-06-24 DIAGNOSIS — I2721 Secondary pulmonary arterial hypertension: Secondary | ICD-10-CM | POA: Diagnosis not present

## 2022-06-24 DIAGNOSIS — I340 Nonrheumatic mitral (valve) insufficiency: Secondary | ICD-10-CM

## 2022-06-24 DIAGNOSIS — I4819 Other persistent atrial fibrillation: Secondary | ICD-10-CM | POA: Diagnosis not present

## 2022-06-24 DIAGNOSIS — E785 Hyperlipidemia, unspecified: Secondary | ICD-10-CM | POA: Diagnosis not present

## 2022-06-24 DIAGNOSIS — R001 Bradycardia, unspecified: Secondary | ICD-10-CM

## 2022-06-24 MED ORDER — METOPROLOL TARTRATE 25 MG PO TABS: 12.5000 mg | ORAL_TABLET | Freq: Two times a day (BID) | ORAL | 3 refills | Status: DC

## 2022-06-24 NOTE — Patient Instructions (Signed)
Medication Instructions:  DECREASE the Metoprolol to 12.5 mg twice daily (half a tablet)  RESUME the Lisinopril 10 mg once daily  *If you need a refill on your cardiac medications before your next appointment, please call your pharmacy*   Lab Work: Lab orders have been placed for a TSH, CBC and BMET If you have labs (blood work) drawn today and your tests are completely normal, you will receive your results only by: MyChart Message (if you have MyChart) OR A paper copy in the mail If you have any lab test that is abnormal or we need to change your treatment, we will call you to review the results.   Testing/Procedures: None ordered   Follow-Up: At [facility], you and your health needs are our priority.  As part of our continuing mission to provide you with exceptional heart care, we have created designated Provider Care Teams.  These Care Teams include your primary Cardiologist (physician) and Advanced Practice Providers (APPs -  Physician Assistants and Nurse Practitioners) who all work together to provide you with the care you need, when you need it.  We recommend signing up for the patient portal called "MyChart".  Sign up information is provided on this After Visit Summary.  MyChart is used to connect with patients for Virtual Visits (Telemedicine).  Patients are able to view lab/test results, encounter notes, upcoming appointments, etc.  Non-urgent messages can be sent to your provider as well.   To learn more about what you can do with MyChart, go to [email or web address].    Your next appointment:   1 month(s)  Provider:   You may see Lorine Bears, MD or one of the following Advanced Practice Providers on your designated Care Team:   Nicolasa Ducking, NP  Other Instructions ZIO XT- Long Term Monitor Instructions  Your physician has requested you wear a ZIO patch monitor for 7 days.  This is a single patch monitor. [employer] supplies one patch monitor per  enrollment. Additional stickers are not available. Please do not apply patch if you will be having a Nuclear Stress Test,  Echocardiogram, Cardiac CT, MRI, or Chest Xray during the period you would be wearing the  monitor. The patch cannot be worn during these tests. You cannot remove and re-apply the  ZIO XT patch monitor.  Your ZIO patch monitor will be mailed 3 day USPS to your address on file. It may take 3-5 days  to receive your monitor after you have been enrolled.  Once you have received your monitor, please review the enclosed instructions. Your monitor  has already been registered assigning a specific monitor serial # to you.  Billing and Patient Assistance Program Information  We have supplied [employer] with any of your insurance information on file for billing purposes. [employer] offers a sliding scale Patient Assistance Program for patients that do not have  insurance, or whose insurance does not completely cover the cost of the ZIO monitor.  You must apply for the Patient Assistance Program to qualify for this discounted rate.  To apply, please call [employer] at [phone], select option 4, select option 2, ask to apply for  Patient Assistance Program. Meredeth Ide will ask your household income, and how many people  are in your household. They will quote your out-of-pocket cost based on that information.  [employer] will also be able to set up a 34-month, interest-free payment plan if needed.  Applying the monitor   Shave hair from upper left chest.  Hold abrader disc  by orange tab. Rub abrader in 40 strokes over the upper left chest as  indicated in your monitor instructions.  Clean area with 4 enclosed alcohol pads. Let dry.  Apply patch as indicated in monitor instructions. Patch will be placed under collarbone on left  side of chest with arrow pointing upward.  Rub patch adhesive wings for 2 minutes. Remove white label marked "1". Remove the white  label marked "2". Rub patch  adhesive wings for 2 additional minutes.  While looking in a mirror, press and release button in center of patch. A small green light will  flash 3-4 times. This will be your only indicator that the monitor has been turned on.  Do not shower for the first 24 hours. You may shower after the first 24 hours.  Press the button if you feel a symptom. You will hear a small click. Record Date, Time and  Symptom in the Patient Logbook.  When you are ready to remove the patch, follow instructions on the last 2 pages of Patient  Logbook. Stick patch monitor onto the last page of Patient Logbook.  Place Patient Logbook in the blue and white box. Use locking tab on box and tape box closed  securely. The blue and white box has prepaid postage on it. Please place it in the mailbox as  soon as possible. Your physician should have your test results approximately 7 days after the  monitor has been mailed back to [place].  Call [facility] Customer Care at [phone] if you have questions regarding  your ZIO XT patch monitor. Call them immediately if you see an orange light blinking on your  monitor.  If your monitor falls off in less than 4 days, contact our Monitor department at [phone].  If your monitor becomes loose or falls off after 4 days call [employer] at [phone] for  suggestions on securing your monitor

## 2022-06-24 NOTE — Progress Notes (Signed)
Office Visit    Patient Name: Whitney Stuart Date of Encounter: 06/24/2022  Primary Care Provider:  Erasmo Downer, MD Primary Cardiologist:  Lorine Bears, MD  Chief Complaint    72 year old female with a history of moderate mitral regurgitation, hypertension, hyperlipidemia, pulmonary hypertension, palpitations, and secondhand smoke exposure, who presents for follow-up related to recently diagnosed atrial fibrillation.  Past Medical History    Past Medical History:  Diagnosis Date   Arthritis    Asthma    Asthma    Chest pain    a. 11/2019 Cor CTA: Calcium score equals 0.  Normal coronary arteries.   Diastolic dysfunction    a. 11/2019 Echo: EF 60-65%.  Grade 1 diastolic dysfunction; b. 12/2021 Echo: EF 60-65%, GrI DD; c. 05/2022 Echo: EF 60-65%, no rwma. Nl RV fxn. RVSP 43.14mmHg. Sev dil LA. Mod MR.   GERD (gastroesophageal reflux disease)    History of cervical cancer    Hyperlipidemia    Hypertension    Mitral regurgitation    a. 12/22023 Echo: Mild-mod MR; b. 05/2022 Echo: Mod MR.   PAH (pulmonary artery hypertension) (HCC)    a. 11/2019 Echo: RVSP ; b .12/2021 Echo: RVSP 39.92mmHg; c. 05/2022 Echo: RVSP 43.70mmHg.   Persistent atrial fibrillation (HCC)    a. Dx 04/2022-->Eliquis (CHA2DS2VASc = 3).   Sleep-disordered breathing    Past Surgical History:  Procedure Laterality Date   ABDOMINAL HYSTERECTOMY  1999   total   HAND SURGERY Bilateral 2003   carpal tunnel   LIPOMA EXCISION      Allergies  No Known Allergies  History of Present Illness    72 year old female with above past medical history including moderate mitral regurgitation, hypertension, hyperlipidemia, pulmonary hypertension, palpitations, recently diagnosed atrial fibrillation, secondhand smoke exposure.  She was previously evaluated in October 2021 with chest pain and dyspnea.  Echo at that time showed normal LV function with grade 1 diastolic dysfunction, mild to moderate MR, and  mild to moderate pulmonary hypertension.  Coronary CT angiogram showed calcium score of 0, coronary arteries.  Home sleep study in June 2022 was normal.  Whitney Stuart was recently seen in April 2024 by Dr. Kirke Corin with complaints of palpitations and tachycardia associated with dyspnea that started shortly after receiving steroids for right hip arthritis.  She was noted to be in rapid atrial fibrillation with rate of 150 bpm.  Aspirin was discontinued and she was placed on Eliquis 5 mg twice daily.  Metoprolol 25 mg twice daily was added with recommendation for follow-up today.  In the interim, an echocardiogram was performed that showed an EF of 60 to 65% with normal RV function, RVSP of 33.9 mmHg, severely dilated left atrium, and moderate mitral regurgitation.  Today, pt and patient's daughter note that following 2 doses of metoprolol, heart rates improved into the 60s-80s, and blood pressures were trending in the 90 range.  In that setting, her previous home dose of lisinopril has been on hold.  They also had to hold metoprolol for period of time.  She has not noticed any change in her level of fatigue and notes ongoing, chronic dyspnea on exertion.  She is in sinus rhythm today and in fact, is bradycardic at 44 bpm.  She took metoprolol 25 mg x 1 this morning.  She is now hypertensive at 154/84.  She denies chest pain, palpitations, PND, orthopnea, dizziness, syncope, edema, or early satiety.  Home Medications    Current Outpatient Medications  Medication Sig  Dispense Refill   albuterol (VENTOLIN HFA) 108 (90 Base) MCG/ACT inhaler Inhale 2 puffs into the lungs every 6 (six) hours as needed for wheezing or shortness of breath. 8.5 each 3   apixaban (ELIQUIS) 5 MG TABS tablet Take 1 tablet (5 mg total) by mouth 2 (two) times daily. 60 tablet 11   celecoxib (CELEBREX) 200 MG capsule Take 200 mg by mouth daily.     clonazePAM (KLONOPIN) 0.5 MG tablet Take 0.5-1 tablets (0.25-0.5 mg total) by mouth 2 (two)  times daily as needed for anxiety. 15 tablet 0   cyclobenzaprine (FLEXERIL) 5 MG tablet TAKE 1 TABLET BY MOUTH EVERYDAY AT BEDTIME 30 tablet 0   fluticasone furoate-vilanterol (BREO ELLIPTA) 200-25 MCG/ACT AEPB INHALE 1 PUFF BY MOUTH EVERY DAY 180 each 0   lisinopril (ZESTRIL) 10 MG tablet Take 10 mg by mouth daily.     loratadine (CLARITIN) 10 MG tablet Take 10 mg by mouth daily.     methylPREDNISolone (MEDROL DOSEPAK) 4 MG TBPK tablet Take 4 mg by mouth as directed.     ondansetron (ZOFRAN) 4 MG tablet Take 1 tablet (4 mg total) by mouth every 8 (eight) hours as needed for nausea or vomiting. 20 tablet 0   rosuvastatin (CRESTOR) 5 MG tablet TAKE 1 TABLET (5 MG TOTAL) BY MOUTH DAILY. STOP ATORVASTATIN 90 tablet 0   sertraline (ZOLOFT) 50 MG tablet Take 1 tablet (50 mg total) by mouth every other day. 45 tablet 3   metoprolol tartrate (LOPRESSOR) 25 MG tablet Take 0.5 tablets (12.5 mg total) by mouth 2 (two) times daily. 90 tablet 3   No current facility-administered medications for this visit.     Review of Systems    Ongoing fatigue and chronic dyspnea on exertion.  She denies chest pain, palpitations, PND, orthopnea, dizziness, syncope, edema, or early satiety.  All other systems reviewed and are otherwise negative except as noted above.    Physical Exam    VS:  BP (!) 156/86 (BP Location: Left Arm, Patient Position: Sitting)   Pulse (!) 44   Ht 5\' 4"  (1.626 m)   Wt 188 lb 6.4 oz (85.5 kg)   SpO2 96%   BMI 32.34 kg/m  , BMI Body mass index is 32.34 kg/m.     Vitals:   06/24/22 1411 06/24/22 1412  BP: (!) 154/84 (!) 156/86  Pulse: (!) 44   SpO2: 96%     GEN: Well nourished, well developed, in no acute distress. HEENT: normal. Neck: Supple, no JVD, carotid bruits, or masses. Cardiac: RRR, bradycardic, no murmurs, rubs, or gallops. No clubbing, cyanosis, edema.  Radials 2+/PT 2+ and equal bilaterally.  Respiratory:  Respirations regular and unlabored, clear to auscultation  bilaterally. GI: Soft, nontender, nondistended, BS + x 4. MS: no deformity or atrophy. Skin: warm and dry, no rash. Neuro:  Strength and sensation are intact. Psych: Normal affect.  Accessory Clinical Findings    ECG personally reviewed by me today -sinus bradycardia, 44- no acute changes.  Lab Results  Component Value Date   WBC 8.1 08/17/2021   HGB 13.5 08/17/2021   HCT 38.9 08/17/2021   MCV 93 08/17/2021   PLT 372 08/17/2021   Lab Results  Component Value Date   CREATININE 1.27 (H) 11/04/2021   BUN 14 11/04/2021   NA 138 11/04/2021   K 5.1 11/04/2021   CL 103 11/04/2021   CO2 21 11/04/2021   Lab Results  Component Value Date   ALT 15 08/17/2021  AST 9 08/17/2021   ALKPHOS 74 08/17/2021   BILITOT 0.5 08/17/2021   Lab Results  Component Value Date   CHOL 160 08/17/2021   HDL 60 08/17/2021   LDLCALC 86 08/17/2021   TRIG 72 08/17/2021   CHOLHDL 2.7 08/17/2021     Assessment & Plan    1.  Persistent atrial fibrillation/sinus bradycardia: Patient recently evaluated with fatigue and noted to be in rapid atrial fibrillation at 150 bpm.  She was placed metoprolol 25 mg twice daily.  Per patient and family, after just 2 doses, her blood pressures dropped and heart rate started trending in the 60s to 80s.  She is in sinus rhythm/bradycardia today at 44 bpm.  She is currently asymptomatic.  I have asked her to reduce her metoprolol to 12.5 mg twice daily with hold parameters for heart rate of less than 50.  I will follow-up with labs including CBC, basic metabolic panel, and TSH.  I am placing a 7-day ZIO monitor to trend her heart rates and potential A-fib burden.  Continue Eliquis.  2.  Essential hypertension: Blood pressure higher all of his previous doses of amlodipine and lisinopril, and now only on very low-dose metoprolol.  Resuming lisinopril 10 mg daily, which she was on prior to her last visit.  3.  Hyperlipidemia: LDL was 86 last July on low-dose atorvastatin  therapy.  4.  Moderate mitral regurgitation: History of mild to moderate MR with moderate MR noted on recent echo, with normal LV function.  Rhythm strip during echo shows sinus rhythm.  Plan to follow-up echo next year.  5.  Dyspnea exertion/fatigue/pulmonary hypertension: This is chronic with normal LV function, normal RV function, and known pulmonary hypertension with recent echo showing stable RVSP of 43.9 mmHg.  Previously underwent coronary CTA which showed normal coronary arteries and a calcium score of 0.  6.  Disposition: Follow-up lab work and 7-day ZIO monitor.  Follow-up in clinic in 1 month or sooner if necessary.   Nicolasa Ducking, NP 06/24/2022, 5:26 PM

## 2022-06-27 DIAGNOSIS — I4819 Other persistent atrial fibrillation: Secondary | ICD-10-CM

## 2022-06-28 DIAGNOSIS — I129 Hypertensive chronic kidney disease with stage 1 through stage 4 chronic kidney disease, or unspecified chronic kidney disease: Secondary | ICD-10-CM | POA: Diagnosis not present

## 2022-06-28 DIAGNOSIS — I10 Essential (primary) hypertension: Secondary | ICD-10-CM | POA: Diagnosis not present

## 2022-06-28 DIAGNOSIS — N1831 Chronic kidney disease, stage 3a: Secondary | ICD-10-CM | POA: Diagnosis not present

## 2022-07-12 DIAGNOSIS — I4819 Other persistent atrial fibrillation: Secondary | ICD-10-CM | POA: Diagnosis not present

## 2022-07-16 DIAGNOSIS — M25551 Pain in right hip: Secondary | ICD-10-CM | POA: Diagnosis not present

## 2022-07-20 ENCOUNTER — Encounter: Payer: 59 | Admitting: Family Medicine

## 2022-07-23 DIAGNOSIS — M25551 Pain in right hip: Secondary | ICD-10-CM | POA: Diagnosis not present

## 2022-07-30 DIAGNOSIS — M25551 Pain in right hip: Secondary | ICD-10-CM | POA: Diagnosis not present

## 2022-08-04 ENCOUNTER — Encounter: Payer: Self-pay | Admitting: Nurse Practitioner

## 2022-08-04 ENCOUNTER — Ambulatory Visit: Payer: 59 | Attending: Nurse Practitioner | Admitting: Nurse Practitioner

## 2022-08-04 VITALS — BP 122/64 | HR 56 | Ht 67.0 in | Wt 186.4 lb

## 2022-08-04 DIAGNOSIS — I10 Essential (primary) hypertension: Secondary | ICD-10-CM | POA: Diagnosis not present

## 2022-08-04 DIAGNOSIS — R0609 Other forms of dyspnea: Secondary | ICD-10-CM | POA: Diagnosis not present

## 2022-08-04 DIAGNOSIS — E785 Hyperlipidemia, unspecified: Secondary | ICD-10-CM | POA: Diagnosis not present

## 2022-08-04 DIAGNOSIS — I2721 Secondary pulmonary arterial hypertension: Secondary | ICD-10-CM

## 2022-08-04 DIAGNOSIS — I340 Nonrheumatic mitral (valve) insufficiency: Secondary | ICD-10-CM

## 2022-08-04 DIAGNOSIS — I4819 Other persistent atrial fibrillation: Secondary | ICD-10-CM

## 2022-08-04 NOTE — Patient Instructions (Signed)
Medication Instructions:  No changes *If you need a refill on your cardiac medications before your next appointment, please call your pharmacy*   Lab Work: None ordered If you have labs (blood work) drawn today and your tests are completely normal, you will receive your results only by: MyChart Message (if you have MyChart) OR A paper copy in the mail If you have any lab test that is abnormal or we need to change your treatment, we will call you to review the results.   Testing/Procedures: None ordered   Follow-Up: At [facility], you and your health needs are our priority.  As part of our continuing mission to provide you with exceptional heart care, we have created designated Provider Care Teams.  These Care Teams include your primary Cardiologist (physician) and Advanced Practice Providers (APPs -  Physician Assistants and Nurse Practitioners) who all work together to provide you with the care you need, when you need it.  We recommend signing up for the patient portal called "MyChart".  Sign up information is provided on this After Visit Summary.  MyChart is used to connect with patients for Virtual Visits (Telemedicine).  Patients are able to view lab/test results, encounter notes, upcoming appointments, etc.  Non-urgent messages can be sent to your provider as well.   To learn more about what you can do with MyChart, go to [email or web address].    Your next appointment:   3 month(s)  Provider:   You may see Lorine Bears, MD or one of the following Advanced Practice Providers on your designated Care Team:   Nicolasa Ducking, NP

## 2022-08-04 NOTE — Progress Notes (Signed)
Office Visit    Patient Name: Whitney Stuart Date of Encounter: 08/04/2022  Primary Care Provider:  Erasmo Downer, MD Primary Cardiologist:  Whitney Bears, MD  Chief Complaint    72 y.o. y/o female with a history of atrial fibrillation, moderate mitral regurgitation, hypertension, hyperlipidemia, pulmonary hypertension, palpitations, and secondhand smoke exposure, who presents for follow-up related to atrial fibrillation.  Past Medical History    Past Medical History:  Diagnosis Date   Arthritis    Asthma    Asthma    Chest pain    a. 11/2019 Cor CTA: Calcium score equals 0.  Normal coronary arteries.   Diastolic dysfunction    a. 11/2019 Echo: EF 60-65%.  Grade 1 diastolic dysfunction; b. 12/2021 Echo: EF 60-65%, GrI DD; c. 05/2022 Echo: EF 60-65%, no rwma. Nl RV fxn. RVSP 43.14mmHg. Sev dil LA. Mod MR.   GERD (gastroesophageal reflux disease)    History of cervical cancer    Hyperlipidemia    Hypertension    Mitral regurgitation    a. 12/22023 Echo: Mild-mod MR; b. 05/2022 Echo: Mod MR.   PAH (pulmonary artery hypertension) (HCC)    a. 11/2019 Echo: RVSP ; b .12/2021 Echo: RVSP 39.80mmHg; c. 05/2022 Echo: RVSP 43.72mmHg.   Persistent atrial fibrillation (HCC)    a. Dx 04/2022-->Eliquis (CHA2DS2VASc = 3); b. 06/2022 Zio: predominantly sinus rhythm at a rate of 66 bpm (range 44-154), 2 brief runs of SVT (fastest 154 bpm x 20 beats) and otherwise rare PACs and PVCs.   Sleep-disordered breathing    Past Surgical History:  Procedure Laterality Date   ABDOMINAL HYSTERECTOMY  1999   total   HAND SURGERY Bilateral 2003   carpal tunnel   LIPOMA EXCISION      Allergies  No Known Allergies  History of Present Illness      72 y.o. y/o female with the above past medical history including atrial fibrillation, moderate mitral regurgitation, hypertension, hyperlipidemia, pulmonary hypertension, palpitations, and secondhand smoke exposure.  She was previously evaluated  for chest pain and dyspnea in October 2021 with echocardiogram showing normal LV function grade 1 diastolic dysfunction, MILD to moderate MR, and mild to moderate pulmonary hypertension.  Coronary CT angiogram showed calcium score of 0 and normal coronary arteries.  Home sleep study in June 2022 was normal.  In April 2024, she was seen in clinic with complaints of palpitations and tachycardia associated with dyspnea and was found to be in rapid atrial fibrillation at 140 bpm.  She was placed on Eliquis 5 mg twice daily and aspirin was discontinued.  Metoprolol 25 mg twice daily was added to her regimen.  Echocardiogram was performed and showed an EF of 60 to 65% with normal RV function, RVSP of 33.9 mmHg, severely dilated left atrium, and moderate mitral regurgitation.  At office follow-up on Jun 24, 2022, she was in sinus rhythm and was bradycardic at 44 bpm after taking 25 mg metoprolol in the morning.  Metoprolol was reduced to 12.5 mg twice daily and a ZIO monitor was placed, which showed predominantly sinus rhythm at a rate of 66 bpm (range 44-154), 2 brief runs of SVT (fastest 154 bpm x 20 beats) and otherwise rare PACs and PVCs.   Since her last visit, Whitney Stuart has felt well.  She has chronic, stable DOE.  She notes slight increases in HRs w/ activity though rates come down quickly w/ rest.  She denies palpitations, chest pain, PND, orthopnea, n, v, dizziness, syncope,  edema, or early satiety.  Home Medications    Current Outpatient Medications  Medication Sig Dispense Refill   albuterol (VENTOLIN HFA) 108 (90 Base) MCG/ACT inhaler Inhale 2 puffs into the lungs every 6 (six) hours as needed for wheezing or shortness of breath. 8.5 each 3   apixaban (ELIQUIS) 5 MG TABS tablet Take 1 tablet (5 mg total) by mouth 2 (two) times daily. 60 tablet 11   celecoxib (CELEBREX) 200 MG capsule Take 200 mg by mouth daily. PRN     clonazePAM (KLONOPIN) 0.5 MG tablet Take 0.5-1 tablets (0.25-0.5 mg total) by  mouth 2 (two) times daily as needed for anxiety. 15 tablet 0   cyclobenzaprine (FLEXERIL) 5 MG tablet TAKE 1 TABLET BY MOUTH EVERYDAY AT BEDTIME 30 tablet 0   fluticasone furoate-vilanterol (BREO ELLIPTA) 200-25 MCG/ACT AEPB INHALE 1 PUFF BY MOUTH EVERY DAY 180 each 0   lisinopril (ZESTRIL) 10 MG tablet Take 10 mg by mouth daily.     loratadine (CLARITIN) 10 MG tablet Take 10 mg by mouth daily.     metoprolol tartrate (LOPRESSOR) 25 MG tablet Take 0.5 tablets (12.5 mg total) by mouth 2 (two) times daily. 90 tablet 3   ondansetron (ZOFRAN) 4 MG tablet Take 1 tablet (4 mg total) by mouth every 8 (eight) hours as needed for nausea or vomiting. 20 tablet 0   rosuvastatin (CRESTOR) 5 MG tablet TAKE 1 TABLET (5 MG TOTAL) BY MOUTH DAILY. STOP ATORVASTATIN 90 tablet 0   sertraline (ZOLOFT) 50 MG tablet Take 1 tablet (50 mg total) by mouth every other day. 45 tablet 3   methylPREDNISolone (MEDROL DOSEPAK) 4 MG TBPK tablet Take 4 mg by mouth as directed. (Patient not taking: Reported on 08/04/2022)     No current facility-administered medications for this visit.     Review of Systems    Chronic, stable DOE.  She denies chest pain, palpitations, pnd, orthopnea, n, v, dizziness, syncope, edema, weight gain, or early satiety.  All other systems reviewed and are otherwise negative except as noted above.    Physical Exam    VS:  BP 122/64 (BP Location: Left Arm, Patient Position: Sitting, Cuff Size: Normal)   Pulse (!) 56   Ht 5\' 7"  (1.702 m)   Wt 186 lb 6.4 oz (84.6 kg)   SpO2 96%   BMI 29.19 kg/m  , BMI Body mass index is 29.19 kg/m.     GEN: Well nourished, well developed, in no acute distress. HEENT: normal. Neck: Supple, no JVD, carotid bruits, or masses. Cardiac: RRR, no murmurs, rubs, or gallops. No clubbing, cyanosis, edema.  Radials 2+/PT 2+ and equal bilaterally.  Respiratory:  Respirations regular and unlabored, clear to auscultation bilaterally. GI: Soft, nontender, nondistended, BS +  x 4. MS: no deformity or atrophy. Skin: warm and dry, no rash. Neuro:  Strength and sensation are intact. Psych: Normal affect.  Accessory Clinical Findings    ECG personally reviewed by me today - EKG Interpretation Date/Time:  Wednesday August 04 2022 13:25:27 EDT Ventricular Rate:  56 PR Interval:  148 QRS Duration:  78 QT Interval:  426 QTC Calculation: 411 R Axis:   20  Text Interpretation: Sinus bradycardia When compared with ECG of 24-Feb-2021 14:03, No significant change was found Confirmed by Nicolasa Ducking [phone]) on 08/04/2022 1:38:44 PM   - no acute changes.  Labs dated Jun 23, 2022 from [facility]:  Hemoglobin 11.3, hematocrit 34.1, WBC 6.8, platelets 394 Sodium 139, potassium 5.3, chloride 106, CO2  25, BUN 21, creatinine 1.21, glucose 91  Lab Results  Component Value Date   ALT 15 08/17/2021   AST 9 08/17/2021   ALKPHOS 74 08/17/2021   BILITOT 0.5 08/17/2021   Lab Results  Component Value Date   CHOL 160 08/17/2021   HDL 60 08/17/2021   LDLCALC 86 08/17/2021   TRIG 72 08/17/2021   CHOLHDL 2.7 08/17/2021    Lab Results  Component Value Date   TSH 0.891 08/17/2021    Assessment & Plan    1.  Persistent Atrial fibrillation/sinus bradycardia: Evaluated earlier this year in the setting of fatigue and palpitations and found to be in rapid atrial fibrillation.  She subsequently converted to sinus rhythm at home on low-dose metoprolol 25 BID and had a heart rate of 44 bpm clinic follow-up last month, prompting reduction in metoprolol 12.5 mg twice daily.  She subsequently wore a 7-day ZIO monitor which showed an average heart rate of 66 bpm with 2 brief runs of SVT but no recurrent atrial fibrillation or prolonged pauses.  She is in sinus rhythm today at 56 bpm and feels well.  She has also been doing well at home.  Continue current dose of metoprolol and Eliquis.  Recent lab work through nephrology on May 29 showed stable renal function and H&H.  We did  discuss that in the setting of any recurrent atrial fibrillation, she will require electrophysiology evaluation as she is likely to develop tachybradycardia syndrome with any advance of AV nodal blocking agents.  2.  Essential hypertension: Blood pressure well-controlled today at 122/64 on low-dose beta-blocker therapy.  3.  Hyperlipidemia: LDL of 86 last July.  She remains on low-dose atorvastatin therapy.  4.  Moderate mitral regurgitation: History of mild-moderate MR with moderate MR noted on recent echo with normal LV function.  Plan to follow-up echo in 2025.  5.  Dyspnea on exertion/fatigue/pulmonary hypertension: Chronic dyspnea on exertion with normal LV and RV function, RVSP of 43.9 mmHg on recent echo, and previously normal coronary arteries with calcium score of 0 on CTA.  She notes stable symptoms today in the absence of chest pain.  6.  Disposition: Follow-up in clinic in 3 months or sooner if necessary.  Informed Consent    Nicolasa Ducking, NP 08/04/2022, 2:02 PM

## 2022-08-06 ENCOUNTER — Other Ambulatory Visit: Payer: Self-pay | Admitting: Family Medicine

## 2022-08-06 DIAGNOSIS — E782 Mixed hyperlipidemia: Secondary | ICD-10-CM

## 2022-08-06 DIAGNOSIS — M25551 Pain in right hip: Secondary | ICD-10-CM | POA: Diagnosis not present

## 2022-08-09 DIAGNOSIS — M25551 Pain in right hip: Secondary | ICD-10-CM | POA: Diagnosis not present

## 2022-08-18 DIAGNOSIS — M25551 Pain in right hip: Secondary | ICD-10-CM | POA: Diagnosis not present

## 2022-08-20 ENCOUNTER — Encounter: Payer: Self-pay | Admitting: Family Medicine

## 2022-08-20 ENCOUNTER — Ambulatory Visit (INDEPENDENT_AMBULATORY_CARE_PROVIDER_SITE_OTHER): Payer: 59 | Admitting: Family Medicine

## 2022-08-20 VITALS — BP 126/59 | HR 60 | Temp 98.2°F | Resp 12 | Ht 67.0 in | Wt 188.6 lb

## 2022-08-20 DIAGNOSIS — Z0000 Encounter for general adult medical examination without abnormal findings: Secondary | ICD-10-CM | POA: Diagnosis not present

## 2022-08-20 DIAGNOSIS — I10 Essential (primary) hypertension: Secondary | ICD-10-CM | POA: Diagnosis not present

## 2022-08-20 DIAGNOSIS — E782 Mixed hyperlipidemia: Secondary | ICD-10-CM | POA: Diagnosis not present

## 2022-08-20 DIAGNOSIS — E669 Obesity, unspecified: Secondary | ICD-10-CM | POA: Diagnosis not present

## 2022-08-20 DIAGNOSIS — R5382 Chronic fatigue, unspecified: Secondary | ICD-10-CM

## 2022-08-20 DIAGNOSIS — F411 Generalized anxiety disorder: Secondary | ICD-10-CM

## 2022-08-20 DIAGNOSIS — N1831 Chronic kidney disease, stage 3a: Secondary | ICD-10-CM

## 2022-08-20 DIAGNOSIS — Z6832 Body mass index (BMI) 32.0-32.9, adult: Secondary | ICD-10-CM

## 2022-08-20 NOTE — Progress Notes (Signed)
Complete physical exam  Patient: Whitney Stuart   DOB: January 10, 1951   72 y.o. Female  MRN: [id number]  Subjective:    Chief Complaint  Patient presents with   Medicare Wellness    Whitney Stuart is a 72 y.o. female who presents today for a complete physical exam. She reports consuming a general diet.  She generally feels well. She reports sleeping well. She does not have additional problems to discuss today.   Discussed the use of AI scribe software for clinical note transcription with the patient, who gave verbal consent to proceed.  History of Present Illness   The patient presents for a routine physical exam and wellness check. They report frustration due to an inability to perform desired activities secondary to a leg injury sustained approximately three to four months ago while lifting an air conditioner. Despite ongoing physical therapy, they still experience difficulty with certain movements, such as crossing their leg, which impacts daily activities like putting on a sock. They express reluctance towards potential hip replacement surgery, a possibility mentioned during therapy.  The patient's anxiety is reportedly well-managed with Zoloft and intermittent use of clonazepam. They also report chronic fatigue, despite taking calcium and vitamin D3 supplements. They have a history of high calcium levels, which led to a reduction in their supplement dosage. They also report a recent episode of atrial fibrillation triggered by steroid use for leg pain, which was monitored by a cardiologist.        Most recent fall risk assessment:    08/20/2022   11:06 AM  Fall Risk   Falls in the past year? 0  Number falls in past yr: 0  Injury with Fall? 0  Risk for fall due to : No Fall Risks  Follow up Falls evaluation completed     Most recent depression screenings:    08/20/2022   11:06 AM 04/06/2022    1:33 PM  PHQ 2/9 Scores  PHQ - 2 Score 2 2  PHQ- 9 Score 8 8        Patient  Care Team: Erasmo Downer, MD as PCP - General (Family Medicine) Iran Ouch, MD as PCP - Cardiology (Cardiology) Elliot Cousin, MD (Inactive) as Consulting Physician (Ophthalmology)   Outpatient Medications Prior to Visit  Medication Sig   albuterol (VENTOLIN HFA) 108 (90 Base) MCG/ACT inhaler Inhale 2 puffs into the lungs every 6 (six) hours as needed for wheezing or shortness of breath.   apixaban (ELIQUIS) 5 MG TABS tablet Take 1 tablet (5 mg total) by mouth 2 (two) times daily.   celecoxib (CELEBREX) 200 MG capsule Take 200 mg by mouth daily. PRN   clonazePAM (KLONOPIN) 0.5 MG tablet Take 0.5-1 tablets (0.25-0.5 mg total) by mouth 2 (two) times daily as needed for anxiety.   cyclobenzaprine (FLEXERIL) 5 MG tablet TAKE 1 TABLET BY MOUTH EVERYDAY AT BEDTIME   fluticasone furoate-vilanterol (BREO ELLIPTA) 200-25 MCG/ACT AEPB INHALE 1 PUFF BY MOUTH EVERY DAY   lisinopril (ZESTRIL) 10 MG tablet Take 10 mg by mouth daily.   loratadine (CLARITIN) 10 MG tablet Take 10 mg by mouth daily.   metoprolol tartrate (LOPRESSOR) 25 MG tablet Take 0.5 tablets (12.5 mg total) by mouth 2 (two) times daily.   rosuvastatin (CRESTOR) 5 MG tablet TAKE 1 TABLET (5 MG TOTAL) BY MOUTH DAILY. STOP ATORVASTATIN   sertraline (ZOLOFT) 50 MG tablet Take 1 tablet (50 mg total) by mouth every other day. (Patient taking differently: Take 50 mg  by mouth daily.)   methylPREDNISolone (MEDROL DOSEPAK) 4 MG TBPK tablet Take 4 mg by mouth as directed.   [DISCONTINUED] ondansetron (ZOFRAN) 4 MG tablet Take 1 tablet (4 mg total) by mouth every 8 (eight) hours as needed for nausea or vomiting.   No facility-administered medications prior to visit.    ROS per HPI     Objective:     BP (!) 126/59 (BP Location: Left Arm, Patient Position: Sitting, Cuff Size: Large)   Pulse 60   Temp 98.2 F (36.8 C) (Temporal)   Resp 12   Ht 5\' 7"  (1.702 m)   Wt 188 lb 9.6 oz (85.5 kg)   SpO2 96%   BMI 29.54 kg/m     Physical Exam Vitals reviewed.  Constitutional:      General: She is not in acute distress.    Appearance: Normal appearance. She is well-developed. She is not diaphoretic.  HENT:     Head: Normocephalic and atraumatic.     Right Ear: Tympanic membrane, ear canal and external ear normal.     Left Ear: Tympanic membrane, ear canal and external ear normal.     Nose: Nose normal.     Mouth/Throat:     Mouth: Mucous membranes are moist.     Pharynx: Oropharynx is clear. No oropharyngeal exudate.  Eyes:     General: No scleral icterus.    Conjunctiva/sclera: Conjunctivae normal.     Pupils: Pupils are equal, round, and reactive to light.  Neck:     Thyroid: No thyromegaly.  Cardiovascular:     Rate and Rhythm: Normal rate and regular rhythm.     Heart sounds: Normal heart sounds. No murmur heard. Pulmonary:     Effort: Pulmonary effort is normal. No respiratory distress.     Breath sounds: Normal breath sounds. No wheezing or rales.  Abdominal:     General: There is no distension.     Palpations: Abdomen is soft.     Tenderness: There is no abdominal tenderness.  Musculoskeletal:        General: No deformity.     Cervical back: Neck supple.     Right lower leg: No edema.     Left lower leg: No edema.  Lymphadenopathy:     Cervical: No cervical adenopathy.  Skin:    General: Skin is warm and dry.     Findings: No rash.  Neurological:     Mental Status: She is alert and oriented to person, place, and time. Mental status is at baseline.     Gait: Gait normal.  Psychiatric:        Mood and Affect: Mood normal.        Behavior: Behavior normal.        Thought Content: Thought content normal.      No results found for any visits on 08/20/22.     Assessment & Plan:    Routine Health Maintenance and Physical Exam  Immunization History  Administered Date(s) Administered   Fluad Quad(high Dose 65+) 10/25/2019   Influenza, High Dose Seasonal PF 01/12/2017, 03/07/2018    Pneumococcal Conjugate-13 01/12/2017   Pneumococcal Polysaccharide-23 03/07/2018   Td 08/23/1995, 06/01/2019    Health Maintenance  Topic Date Due   Zoster Vaccines- Shingrix (1 of 2) Never done   COVID-19 Vaccine (1 - 2023-24 season) Never done   INFLUENZA VACCINE  08/26/2022   Medicare Annual Wellness (AWV)  12/10/2022   MAMMOGRAM  05/11/2023   Fecal DNA (Cologuard)  03/03/2024   DEXA SCAN  05/11/2027   DTaP/Tdap/Td (3 - Tdap) 05/31/2029   Pneumonia Vaccine 18+ Years old  Completed   Hepatitis C Screening  Completed   HPV VACCINES  Aged Out    Discussed health benefits of physical activity, and encouraged her to engage in regular exercise appropriate for her age and condition.  Problem List Items Addressed This Visit       Cardiovascular and Mediastinum   Essential (primary) hypertension   Relevant Orders   CBC with Differential/Platelet   Comprehensive metabolic panel     Genitourinary   Chronic kidney disease, stage 3a (HCC)   Relevant Orders   CBC with Differential/Platelet   VITAMIN D 25 Hydroxy (Vit-D Deficiency, Fractures)   B12   Parathyroid hormone, intact (no Ca)   Urine Microalbumin w/creat. ratio     Other   Class 1 obesity with serious comorbidity and body mass index (BMI) of 32.0 to 32.9 in adult   Relevant Orders   TSH   CBC with Differential/Platelet   Comprehensive metabolic panel   Lipid panel   VITAMIN D 25 Hydroxy (Vit-D Deficiency, Fractures)   B12   Parathyroid hormone, intact (no Ca)   Urine Microalbumin w/creat. ratio   Hyperlipidemia   Relevant Orders   Comprehensive metabolic panel   Lipid panel   GAD (generalized anxiety disorder)   Relevant Orders   TSH   Chronic fatigue   Relevant Orders   TSH   VITAMIN D 25 Hydroxy (Vit-D Deficiency, Fractures)   B12   Other Visit Diagnoses     Encounter for annual physical exam    -  Primary   Relevant Orders   TSH   CBC with Differential/Platelet   Comprehensive metabolic panel    Lipid panel   VITAMIN D 25 Hydroxy (Vit-D Deficiency, Fractures)   B12   Parathyroid hormone, intact (no Ca)   Urine Microalbumin w/creat. ratio           Lower Extremity Injury: Limited mobility and difficulty with activities of daily living due to an injury sustained while lifting an air conditioner 4 months ago. Currently undergoing physical therapy at [facility]. -Continue physical therapy. -Consider referral back to orthopedics if no improvement in mobility.  Anxiety: Stable on Zoloft and intermittent Clonazepam. -Continue current regimen.  General Health Maintenance: Up to date on most screenings and vaccines. -Complete labs today for kidney and liver function, cholesterol, blood counts, and vitamin B12. -Continue Calcium and Vitamin D3 supplement once daily. -Schedule follow-up appointment in 6 months.       Return in about 6 months (around 02/20/2023) for chronic disease f/u.     Shirlee Latch, MD

## 2022-08-23 DIAGNOSIS — M25551 Pain in right hip: Secondary | ICD-10-CM | POA: Diagnosis not present

## 2022-08-23 DIAGNOSIS — R5382 Chronic fatigue, unspecified: Secondary | ICD-10-CM | POA: Diagnosis not present

## 2022-08-23 DIAGNOSIS — Z0000 Encounter for general adult medical examination without abnormal findings: Secondary | ICD-10-CM | POA: Diagnosis not present

## 2022-08-30 DIAGNOSIS — M25551 Pain in right hip: Secondary | ICD-10-CM | POA: Diagnosis not present

## 2022-09-13 DIAGNOSIS — M25551 Pain in right hip: Secondary | ICD-10-CM | POA: Diagnosis not present

## 2022-09-20 DIAGNOSIS — M25551 Pain in right hip: Secondary | ICD-10-CM | POA: Diagnosis not present

## 2022-09-21 ENCOUNTER — Other Ambulatory Visit: Payer: Self-pay | Admitting: Family Medicine

## 2022-09-21 DIAGNOSIS — I10 Essential (primary) hypertension: Secondary | ICD-10-CM

## 2022-09-27 ENCOUNTER — Other Ambulatory Visit: Payer: Self-pay | Admitting: Family Medicine

## 2022-09-27 DIAGNOSIS — J4520 Mild intermittent asthma, uncomplicated: Secondary | ICD-10-CM

## 2022-09-29 DIAGNOSIS — M25551 Pain in right hip: Secondary | ICD-10-CM | POA: Diagnosis not present

## 2022-10-11 DIAGNOSIS — M25551 Pain in right hip: Secondary | ICD-10-CM | POA: Diagnosis not present

## 2022-10-20 DIAGNOSIS — M25551 Pain in right hip: Secondary | ICD-10-CM | POA: Diagnosis not present

## 2022-10-21 DIAGNOSIS — M1611 Unilateral primary osteoarthritis, right hip: Secondary | ICD-10-CM | POA: Diagnosis not present

## 2022-10-21 DIAGNOSIS — M545 Low back pain, unspecified: Secondary | ICD-10-CM | POA: Diagnosis not present

## 2022-10-21 DIAGNOSIS — M25551 Pain in right hip: Secondary | ICD-10-CM | POA: Diagnosis not present

## 2022-10-25 DIAGNOSIS — M25551 Pain in right hip: Secondary | ICD-10-CM | POA: Diagnosis not present

## 2022-11-02 DIAGNOSIS — M25551 Pain in right hip: Secondary | ICD-10-CM | POA: Diagnosis not present

## 2022-11-04 ENCOUNTER — Ambulatory Visit: Payer: 59 | Admitting: Cardiovascular Disease

## 2022-11-10 ENCOUNTER — Encounter: Payer: Self-pay | Admitting: Nurse Practitioner

## 2022-11-10 ENCOUNTER — Ambulatory Visit: Payer: 59 | Attending: Cardiovascular Disease | Admitting: Nurse Practitioner

## 2022-11-10 VITALS — BP 122/72 | HR 53 | Ht 67.0 in | Wt 190.6 lb

## 2022-11-10 DIAGNOSIS — R001 Bradycardia, unspecified: Secondary | ICD-10-CM | POA: Diagnosis not present

## 2022-11-10 DIAGNOSIS — I2721 Secondary pulmonary arterial hypertension: Secondary | ICD-10-CM | POA: Diagnosis not present

## 2022-11-10 DIAGNOSIS — I10 Essential (primary) hypertension: Secondary | ICD-10-CM

## 2022-11-10 DIAGNOSIS — R0609 Other forms of dyspnea: Secondary | ICD-10-CM | POA: Diagnosis not present

## 2022-11-10 DIAGNOSIS — I4819 Other persistent atrial fibrillation: Secondary | ICD-10-CM

## 2022-11-10 DIAGNOSIS — E785 Hyperlipidemia, unspecified: Secondary | ICD-10-CM

## 2022-11-10 DIAGNOSIS — I340 Nonrheumatic mitral (valve) insufficiency: Secondary | ICD-10-CM | POA: Diagnosis not present

## 2022-11-10 NOTE — Patient Instructions (Signed)
Medication Instructions:  No changes *If you need a refill on your cardiac medications before your next appointment, please call your pharmacy*   Lab Work: None ordered If you have labs (blood work) drawn today and your tests are completely normal, you will receive your results only by: MyChart Message (if you have MyChart) OR A paper copy in the mail If you have any lab test that is abnormal or we need to change your treatment, we will call you to review the results.   Testing/Procedures: None ordered   Follow-Up: At [facility], you and your health needs are our priority.  As part of our continuing mission to provide you with exceptional heart care, we have created designated Provider Care Teams.  These Care Teams include your primary Cardiologist (physician) and Advanced Practice Providers (APPs -  Physician Assistants and Nurse Practitioners) who all work together to provide you with the care you need, when you need it.  We recommend signing up for the patient portal called "MyChart".  Sign up information is provided on this After Visit Summary.  MyChart is used to connect with patients for Virtual Visits (Telemedicine).  Patients are able to view lab/test results, encounter notes, upcoming appointments, etc.  Non-urgent messages can be sent to your provider as well.   To learn more about what you can do with MyChart, go to [email or web address].    Your next appointment:   3 month(s)  Provider:   You may see Lorine Bears, MD or one of the following Advanced Practice Providers on your designated Care Team:   Nicolasa Ducking, NP

## 2022-11-10 NOTE — Progress Notes (Signed)
Office Visit    Patient Name: Whitney Stuart Date of Encounter: 11/10/2022  Primary Care Provider:  Erasmo Downer, MD Primary Cardiologist:  Lorine Bears, MD  Chief Complaint    72 y.o. female with a history of atrial fibrillation, moderate mitral regurgitation, hypertension, hyperlipidemia, pulmonary hypertension, palpitations, and secondhand smoke exposure, who presents for follow-up related to atrial fibrillation.   Past Medical History  Subjective   Past Medical History:  Diagnosis Date   Arthritis    Asthma    Asthma    Chest pain    a. 11/2019 Cor CTA: Calcium score equals 0.  Normal coronary arteries.   Diastolic dysfunction    a. 11/2019 Echo: EF 60-65%.  Grade 1 diastolic dysfunction; b. 12/2021 Echo: EF 60-65%, GrI DD; c. 05/2022 Echo: EF 60-65%, no rwma. Nl RV fxn. RVSP 43.54mmHg. Sev dil LA. Mod MR.   GERD (gastroesophageal reflux disease)    History of cervical cancer    Hyperlipidemia    Hypertension    Mitral regurgitation    a. 12/22023 Echo: Mild-mod MR; b. 05/2022 Echo: Mod MR.   PAH (pulmonary artery hypertension) (HCC)    a. 11/2019 Echo: RVSP ; b .12/2021 Echo: RVSP 39.54mmHg; c. 05/2022 Echo: RVSP 43.104mmHg.   Persistent atrial fibrillation (HCC)    a. Dx 04/2022-->Eliquis (CHA2DS2VASc = 3); b. 06/2022 Zio: predominantly sinus rhythm at a rate of 66 bpm (range 44-154), 2 brief runs of SVT (fastest 154 bpm x 20 beats) and otherwise rare PACs and PVCs.   Sleep-disordered breathing    Past Surgical History:  Procedure Laterality Date   ABDOMINAL HYSTERECTOMY  1999   total   HAND SURGERY Bilateral 2003   carpal tunnel   LIPOMA EXCISION      Allergies  No Known Allergies    History of Present Illness      72 y.o. y/o female with the above past medical history including atrial fibrillation, moderate mitral regurgitation, hypertension, hyperlipidemia, pulmonary hypertension, palpitations, and secondhand smoke exposure.  She was  previously evaluated for chest pain and dyspnea in October 2021 with echocardiogram showing normal LV function grade 1 diastolic dysfunction, MILD to moderate MR, and mild to moderate pulmonary hypertension.  Coronary CT angiogram showed calcium score of 0 and normal coronary arteries.  Home sleep study in June 2022 was normal.  In April 2024, she was seen in clinic with complaints of palpitations and tachycardia associated with dyspnea and was found to be in rapid atrial fibrillation at 140 bpm.  She was placed on Eliquis 5 mg twice daily and aspirin was discontinued.  Metoprolol 25 mg twice daily was added to her regimen.  Echocardiogram was performed and showed an EF of 60 to 65% with normal RV function, RVSP of 33.9 mmHg, severely dilated left atrium, and moderate mitral regurgitation.  At office follow-up on Jun 24, 2022, she was in sinus but was bradycardic at 44 bpm after taking 25 mg metoprolol bid.  Metoprolol was reduced to 12.5 mg twice daily and a ZIO monitor was placed, which showed predominantly sinus rhythm at a rate of 66 bpm (range 44-154), 2 brief runs of SVT (fastest 154 bpm x 20 beats) and otherwise rare PACs and PVCs.    Ms. Klich was last seen in cardiology clinic in July 2024, at which time she was doing well and heart rate was stable.  Over the last 3 months, she has been participating in physical therapy for hip pain, and has tolerated  reasonably well.  She has noted occasional spells, about twice/month, where she awakens feeling "nervous in [her] chest," assoc w/ dyspnea, lasting a few seconds to minutes, and resolving spontaneously.  She has never checked her heart rate during 1 of those episodes.  On other occasions, after awakening, she might feel somewhat weak.  Prior to taking her medication, she will check her heart rate and blood pressure about 3 hours after awakening and has documented heart rates in the 40s, though rate seem to come up very quickly, while she is checking on a  pulse ox.  She has not had any presyncope or syncope.  She denies chest pain, palpitations, PND, orthopnea, dizziness, syncope, edema, or early satiety.  She has some degree of chronic, stable dyspnea on exertion when she walks to her mailbox. Objective  Home Medications    Current Outpatient Medications  Medication Sig Dispense Refill   albuterol (VENTOLIN HFA) 108 (90 Base) MCG/ACT inhaler Inhale 2 puffs into the lungs every 6 (six) hours as needed for wheezing or shortness of breath. 8.5 each 3   apixaban (ELIQUIS) 5 MG TABS tablet Take 1 tablet (5 mg total) by mouth 2 (two) times daily. 60 tablet 11   BREO ELLIPTA 200-25 MCG/ACT AEPB INHALE 1 PUFF BY MOUTH EVERY DAY 180 each 0   celecoxib (CELEBREX) 200 MG capsule Take 200 mg by mouth daily. PRN     clonazePAM (KLONOPIN) 0.5 MG tablet Take 0.5-1 tablets (0.25-0.5 mg total) by mouth 2 (two) times daily as needed for anxiety. 15 tablet 0   cyclobenzaprine (FLEXERIL) 5 MG tablet TAKE 1 TABLET BY MOUTH EVERYDAY AT BEDTIME 30 tablet 0   lisinopril (ZESTRIL) 10 MG tablet Take 10 mg by mouth daily.     loratadine (CLARITIN) 10 MG tablet Take 10 mg by mouth daily.     metoprolol tartrate (LOPRESSOR) 25 MG tablet Take 0.5 tablets (12.5 mg total) by mouth 2 (two) times daily. 90 tablet 3   rosuvastatin (CRESTOR) 5 MG tablet TAKE 1 TABLET (5 MG TOTAL) BY MOUTH DAILY. STOP ATORVASTATIN 90 tablet 3   sertraline (ZOLOFT) 50 MG tablet Take 1 tablet (50 mg total) by mouth every other day. (Patient taking differently: Take 50 mg by mouth daily.) 45 tablet 3   No current facility-administered medications for this visit.     Physical Exam    VS:  BP 122/72 (BP Location: Left Arm, Patient Position: Sitting, Cuff Size: Normal)   Pulse (!) 53   Ht 5\' 7"  (1.702 m)   Wt 190 lb 9.6 oz (86.5 kg)   SpO2 98%   BMI 29.85 kg/m  , BMI Body mass index is 29.85 kg/m.       GEN: Well nourished, well developed, in no acute distress. HEENT: normal. Neck: Supple,  no JVD, carotid bruits, or masses. Cardiac: RRR, bradycardic, no murmurs, rubs, or gallops. No clubbing, cyanosis, edema.  Radials 2+/PT 2+ and equal bilaterally.  Respiratory:  Respirations regular and unlabored, clear to auscultation bilaterally. GI: Soft, nontender, nondistended, BS + x 4. MS: no deformity or atrophy. Skin: warm and dry, no rash. Neuro:  Strength and sensation are intact. Psych: Normal affect.  Accessory Clinical Findings    ECG personally reviewed by me today - EKG Interpretation Date/Time:  Wednesday November 10 2022 14:47:53 EDT Ventricular Rate:  53 PR Interval:  158 QRS Duration:  80 QT Interval:  432 QTC Calculation: 405 R Axis:   59  Text Interpretation: Sinus bradycardia Early repolarization  Confirmed by Nicolasa Ducking [phone]) on 11/10/2022 2:56:35 PM - no acute changes.  Lab Results  Component Value Date   WBC 7.0 08/23/2022   HGB 12.2 08/23/2022   HCT 37.7 08/23/2022   MCV 95 08/23/2022   PLT 354 08/23/2022   Lab Results  Component Value Date   CREATININE 1.30 (H) 08/23/2022   BUN 23 08/23/2022   NA 141 08/23/2022   K 5.2 08/23/2022   CL 106 08/23/2022   CO2 23 08/23/2022   Lab Results  Component Value Date   ALT 13 08/23/2022   AST 10 08/23/2022   ALKPHOS 70 08/23/2022   BILITOT 0.4 08/23/2022   Lab Results  Component Value Date   CHOL 191 08/23/2022   HDL 75 08/23/2022   LDLCALC 100 (H) 08/23/2022   TRIG 87 08/23/2022   CHOLHDL 2.5 08/23/2022    Lab Results  Component Value Date   TSH 1.380 08/23/2022       Assessment & Plan    1.  Persistent atrial fibrillation/sinus bradycardia: Diagnosed with atrial fibrillation earlier this year.  She did not tolerate metoprolol 25 mg twice daily and generally has been doing well on 12.5 twice daily, though about twice a month, will awaken with a sensation of nervousness in her chest and weakness.  She has never checked her heart rate during those episodes.  On other occasions,  during spells of weakness in the mornings, she has checked her blood pressure prior to her a.m. medications and documented heart rates in the 40s and blood pressures in the low 100s.  When that occurs, she does not take her metoprolol.  Symptoms and objective findings typically past within seconds to minutes.  We discussed potentially repeating event monitoring today with question of bradycardia or tachyarrhythmias occurring during the night however, she wishes to defer at this time and reiterates that the symptoms do not occur that frequently.  We also discussed potentially changing her from metoprolol to tartrate 12.5 twice daily to Toprol-XL 12.5 daily.  At this time, she wishes to continue current treatment which is reasonable.  She does have a smart watch and I asked her to check her heart rate and if possible obtain a 1-lead ECG during the spells, and transmit via MyChart.  Continue Eliquis.  2.  Primary hypertension: Blood pressure initially elevated at 152/80 however on repeat, she dropped to 122/72.  She remains on beta-blocker and ACE inhibitor therapy with relatively stable renal function electrolytes and July.  3.  Hyperlipidemia: LDL was 100 in July, which is up from last year.  Encouraged her to get more frequent exercise-30 minutes daily at least 5 days a week.  She remains on rosuvastatin therapy.  4.  Moderate mitral regurgitation: Noted on echo earlier this year with normal LV function.  Will plan to follow-up echo in 1 to 2 years.  5.  Pulmonary hypertension/dyspnea on exertion: Some degree of chronic dyspnea with echo earlier this year showing stable RVSP of 43.9 mmHg.  Prior coronary CTA showed normal coronary arteries with calcium score 0.  She is euvolemic on examination with stable heart rate and blood pressure.  Encouraged regular exercise in the setting of probable deconditioning.  Prior normal sleep study in 2022.  6.  Disposition: Follow-up in 3 months or sooner if  necessary.  Nicolasa Ducking, NP 11/10/2022, 3:19 PM

## 2022-11-16 ENCOUNTER — Telehealth: Payer: Self-pay | Admitting: Pharmacist

## 2022-11-16 DIAGNOSIS — M25551 Pain in right hip: Secondary | ICD-10-CM | POA: Diagnosis not present

## 2022-11-16 NOTE — Progress Notes (Signed)
11/16/2022  Patient ID: Whitney Stuart, female   DOB: 1950-05-19, 72 y.o.   MRN: [id number]  Pharmacy Quality Measure Review  This patient is appearing on a report for being at risk of failing the adherence measure for cholesterol (statin) medications this calendar year.   Medication: Rosuvastatin 5mg  Last fill date: 10/29/22 for 90 day supply  Insurance report was not up to date. No action needed at this time.     Marlowe Aschoff, PharmD [facility] Phone Number: [phone]

## 2022-11-24 DIAGNOSIS — M25551 Pain in right hip: Secondary | ICD-10-CM | POA: Diagnosis not present

## 2022-12-01 DIAGNOSIS — M25551 Pain in right hip: Secondary | ICD-10-CM | POA: Diagnosis not present

## 2022-12-27 DIAGNOSIS — I129 Hypertensive chronic kidney disease with stage 1 through stage 4 chronic kidney disease, or unspecified chronic kidney disease: Secondary | ICD-10-CM | POA: Diagnosis not present

## 2022-12-27 DIAGNOSIS — I10 Essential (primary) hypertension: Secondary | ICD-10-CM | POA: Diagnosis not present

## 2022-12-27 DIAGNOSIS — N1831 Chronic kidney disease, stage 3a: Secondary | ICD-10-CM | POA: Diagnosis not present

## 2022-12-28 ENCOUNTER — Ambulatory Visit: Payer: 59

## 2022-12-28 DIAGNOSIS — Z0000 Encounter for general adult medical examination without abnormal findings: Secondary | ICD-10-CM | POA: Diagnosis not present

## 2022-12-28 NOTE — Patient Instructions (Addendum)
Whitney Stuart , Thank you for taking time to come for your Medicare Wellness Visit. I appreciate your ongoing commitment to your health goals. Please review the following plan we discussed and let me know if I can assist you in the future.   Referrals/Orders/Follow-Ups/Clinician Recommendations: none  This is a list of the screening recommended for you and due dates:  Health Maintenance  Topic Date Due   Zoster (Shingles) Vaccine (1 of 2) Never done   COVID-19 Vaccine (1 - 2023-24 season) Never done   Flu Shot  04/25/2023*   Mammogram  05/11/2023   Medicare Annual Wellness Visit  12/28/2023   Cologuard (Stool DNA test)  03/03/2024   DEXA scan (bone density measurement)  05/11/2027   DTaP/Tdap/Td vaccine (3 - Tdap) 05/31/2029   Pneumonia Vaccine  Completed   Hepatitis C Screening  Completed   HPV Vaccine  Aged Out  *Topic was postponed. The date shown is not the original due date.    Advanced directives: (ACP Link)Information on Advanced Care Planning can be found at [facility] of [facility] Directives Advance Health Care Directives ([email or web address]   Next Medicare Annual Wellness Visit scheduled for next year: Yes   01/03/24 @ 3:10 pm by video

## 2022-12-28 NOTE — Progress Notes (Signed)
Subjective:   Whitney Stuart is a 72 y.o. female who presents for Medicare Annual (Subsequent) preventive examination.  Visit Complete: Virtual I connected with  Whitney Stuart on 12/28/22 by a audio enabled telemedicine application and verified that I am speaking with the correct person using two identifiers.  Patient Location: Home  Provider Location: Office/Clinic  I discussed the limitations of evaluation and management by telemedicine. The patient expressed understanding and agreed to proceed.  Vital Signs: Because this visit was a virtual/telehealth visit, some criteria may be missing or patient reported. Any vitals not documented were not able to be obtained and vitals that have been documented are patient reported.   Cardiac Risk Factors include: advanced age (>85men, >24 women);dyslipidemia;hypertension     Objective:    Today's Vitals   12/28/22 1531  PainSc: 5    There is no height or weight on file to calculate BMI.     12/28/2022    3:37 PM 12/09/2021    2:15 PM 12/08/2020    2:34 PM 03/14/2019    9:41 AM 03/07/2018    1:30 PM 08/24/2017    3:38 PM  Advanced Directives  Does Patient Have a Medical Advance Directive? No No No No No No  Would patient like information on creating a medical advance directive? No - Patient declined No - Patient declined No - Patient declined No - Patient declined No - Patient declined No - Patient declined    Current Medications (verified) Outpatient Encounter Medications as of 12/28/2022  Medication Sig   albuterol (VENTOLIN HFA) 108 (90 Base) MCG/ACT inhaler Inhale 2 puffs into the lungs every 6 (six) hours as needed for wheezing or shortness of breath.   apixaban (ELIQUIS) 5 MG TABS tablet Take 1 tablet (5 mg total) by mouth 2 (two) times daily.   BREO ELLIPTA 200-25 MCG/ACT AEPB INHALE 1 PUFF BY MOUTH EVERY DAY   celecoxib (CELEBREX) 200 MG capsule Take 200 mg by mouth daily. PRN   clonazePAM (KLONOPIN) 0.5 MG tablet Take  0.5-1 tablets (0.25-0.5 mg total) by mouth 2 (two) times daily as needed for anxiety.   cyclobenzaprine (FLEXERIL) 5 MG tablet TAKE 1 TABLET BY MOUTH EVERYDAY AT BEDTIME   lisinopril (ZESTRIL) 10 MG tablet Take 10 mg by mouth daily.   loratadine (CLARITIN) 10 MG tablet Take 10 mg by mouth daily.   metoprolol tartrate (LOPRESSOR) 25 MG tablet Take 0.5 tablets (12.5 mg total) by mouth 2 (two) times daily.   rosuvastatin (CRESTOR) 5 MG tablet TAKE 1 TABLET (5 MG TOTAL) BY MOUTH DAILY. STOP ATORVASTATIN   sertraline (ZOLOFT) 50 MG tablet Take 1 tablet (50 mg total) by mouth every other day. (Patient taking differently: Take 50 mg by mouth daily.)   No facility-administered encounter medications on file as of 12/28/2022.    Allergies (verified) Patient has no known allergies.   History: Past Medical History:  Diagnosis Date   Arthritis    Asthma    Asthma    Chest pain    a. 11/2019 Cor CTA: Calcium score equals 0.  Normal coronary arteries.   Diastolic dysfunction    a. 11/2019 Echo: EF 60-65%.  Grade 1 diastolic dysfunction; b. 12/2021 Echo: EF 60-65%, GrI DD; c. 05/2022 Echo: EF 60-65%, no rwma. Nl RV fxn. RVSP 43.61mmHg. Sev dil LA. Mod MR.   GERD (gastroesophageal reflux disease)    History of cervical cancer    Hyperlipidemia    Hypertension    Mitral regurgitation  a. 12/22023 Echo: Mild-mod MR; b. 05/2022 Echo: Mod MR.   PAH (pulmonary artery hypertension) (HCC)    a. 11/2019 Echo: RVSP ; b .12/2021 Echo: RVSP 39.23mmHg; c. 05/2022 Echo: RVSP 43.51mmHg.   Persistent atrial fibrillation (HCC)    a. Dx 04/2022-->Eliquis (CHA2DS2VASc = 3); b. 06/2022 Zio: predominantly sinus rhythm at a rate of 66 bpm (range 44-154), 2 brief runs of SVT (fastest 154 bpm x 20 beats) and otherwise rare PACs and PVCs.   Sleep-disordered breathing    Past Surgical History:  Procedure Laterality Date   ABDOMINAL HYSTERECTOMY  1999   total   HAND SURGERY Bilateral 2003   carpal tunnel   LIPOMA  EXCISION     Family History  Problem Relation Age of Onset   Heart disease Mother    Stroke Mother    Congestive Heart Failure Mother    Hypertension Mother    Hypertension Father    Kidney disease Father    Heart murmur Sister    Hypertension Sister    Ovarian cancer Sister        ovarian   Hypertension Sister    Hypertension Sister    Lung cancer Brother    Hypertension Brother    Hypertension Brother    Sleep apnea Brother    Hypertension Brother    Heart disease Brother    Hypertension Brother    Hypertension Brother    Hypertension Brother    Hypertension Brother    Hypertension Brother    Mental illness Maternal Aunt    Colon cancer Neg Hx    Breast cancer Neg Hx    Social History   Socioeconomic History   Marital status: Married    Spouse name: Whitney Stuart   Number of children: 7   Years of education: Not on file   Highest education level: GED or equivalent  Occupational History   Occupation: Retired  Tobacco Use   Smoking status: Former    Current packs/day: 0.00    Average packs/day: 0.5 packs/day for 6.0 years (3.0 ttl pk-yrs)    Types: Cigarettes    Start date: 01/25/1991    Quit date: 01/24/1997    Years since quitting: 25.9   Smokeless tobacco: Never  Vaping Use   Vaping status: Never Used  Substance and Sexual Activity   Alcohol use: No   Drug use: No   Sexual activity: Yes    Birth control/protection: Surgical  Other Topics Concern   Not on file  Social History Narrative   Not on file   Social Determinants of Health   Financial Resource Strain: Low Risk  (12/28/2022)   Overall Financial Resource Strain (CARDIA)    Difficulty of Paying Living Expenses: Not hard at all  Food Insecurity: No Food Insecurity (12/28/2022)   Hunger Vital Sign    Worried About Running Out of Food in the Last Year: Never true    Ran Out of Food in the Last Year: Never true  Transportation Needs: No Transportation Needs (12/28/2022)   PRAPARE -  Administrator, Civil Service (Medical): No    Lack of Transportation (Non-Medical): No  Physical Activity: Insufficiently Active (12/28/2022)   Exercise Vital Sign    Days of Exercise per Week: 3 days    Minutes of Exercise per Session: 30 min  Stress: No Stress Concern Present (12/28/2022)   [employer] of Occupational Health - Occupational Stress Questionnaire    Feeling of Stress : Not at all  Social Connections: Socially Isolated (12/28/2022)   Social Connection and Isolation Panel [NHANES]    Frequency of Communication with Friends and Family: More than three times a week    Frequency of Social Gatherings with Friends and Family: Never    Attends Religious Services: Never    Database administrator or Organizations: No    Attends Banker Meetings: Never    Marital Status: Widowed    Tobacco Counseling Counseling given: Not Answered   Clinical Intake:  Pre-visit preparation completed: Yes  Pain : 0-10 Pain Score: 5  Pain Type: Chronic pain Pain Location: Back Pain Orientation: Mid Pain Radiating Towards: to side Pain Descriptors / Indicators: Aching, Burning Pain Onset: More than a month ago Pain Frequency: Intermittent Pain Relieving Factors: tylenol  Pain Relieving Factors: tylenol  Nutritional Status: BMI 25 -29 Overweight Nutritional Risks: None Diabetes: No  How often do you need to have someone help you when you read instructions, pamphlets, or other written materials from your doctor or pharmacy?: 1 - Never  Interpreter Needed?: No  Information entered by :: Kennedy Bucker, LPN   Activities of Daily Living    12/28/2022    3:38 PM 08/20/2022   11:06 AM  In your present state of health, do you have any difficulty performing the following activities:  Hearing? 0 0  Vision? 0 1  Difficulty concentrating or making decisions? 0 0  Walking or climbing stairs? 0 0  Dressing or bathing? 0 0  Doing errands, shopping? 0 0   Preparing Food and eating ? N   Using the Toilet? N   In the past six months, have you accidently leaked urine? N   Do you have problems with loss of bowel control? N   Managing your Medications? N   Managing your Finances? N   Housekeeping or managing your Housekeeping? N     Patient Care Team: Erasmo Downer, MD as PCP - General (Family Medicine) Iran Ouch, MD as PCP - Cardiology (Cardiology) Elliot Cousin, MD (Inactive) as Consulting Physician (Ophthalmology)  Indicate any recent Medical Services you may have received from other than [facility] providers in the past year (date may be approximate).     Assessment:   This is a routine wellness examination for Whitney Stuart.  Hearing/Vision screen Hearing Screening - Comments:: No aids Vision Screening - Comments:: Wears glasses- [facility]   Goals Addressed             This Visit's Progress    DIET - INCREASE WATER INTAKE         Depression Screen    12/28/2022    3:35 PM 08/20/2022   11:06 AM 04/06/2022    1:33 PM 12/09/2021    2:12 PM 08/17/2021    1:07 PM 02/16/2021    2:08 PM 07/17/2020    1:18 PM  PHQ 2/9 Scores  PHQ - 2 Score 2 2 2  0 2 0 0  PHQ- 9 Score 2 8 8  0 8 4 5     Fall Risk    12/28/2022    3:38 PM 08/20/2022   11:06 AM 04/06/2022    1:33 PM 12/09/2021    2:15 PM 08/17/2021    1:09 PM  Fall Risk   Falls in the past year? 0 0 0 0 0  Number falls in past yr: 0 0 0 0 0  Injury with Fall? 0 0 0 0 0  Risk for fall due to : No Fall Risks  No Fall Risks No Fall Risks No Fall Risks   Follow up Falls prevention discussed;Falls evaluation completed Falls evaluation completed Falls evaluation completed Falls prevention discussed;Falls evaluation completed     MEDICARE RISK AT HOME: Medicare Risk at Home Any stairs in or around the home?: Yes If so, are there any without handrails?: No Home free of loose throw rugs in walkways, pet beds, electrical cords, etc?: Yes Adequate lighting in your home to  reduce risk of falls?: Yes Life alert?: No Use of a cane, walker or w/c?: No Grab bars in the bathroom?: No Shower chair or bench in shower?: No Elevated toilet seat or a handicapped toilet?: No  TIMED UP AND GO:  Was the test performed?  No    Cognitive Function:        12/28/2022    3:39 PM 12/09/2021    2:20 PM  6CIT Screen  What Year? 0 points 0 points  What month? 0 points 0 points  What time? 0 points 0 points  Count back from 20 0 points 0 points  Months in reverse 0 points 2 points  Repeat phrase 0 points 4 points  Total Score 0 points 6 points    Immunizations Immunization History  Administered Date(s) Administered   Fluad Quad(high Dose 65+) 10/25/2019   Influenza, High Dose Seasonal PF 01/12/2017, 03/07/2018   Pneumococcal Conjugate-13 01/12/2017   Pneumococcal Polysaccharide-23 03/07/2018   Td 08/23/1995, 06/01/2019    TDAP status: Up to date  Flu Vaccine status: Declined, Education has been provided regarding the importance of this vaccine but patient still declined. Advised may receive this vaccine at local pharmacy or Health Dept. Aware to provide a copy of the vaccination record if obtained from local pharmacy or Health Dept. Verbalized acceptance and understanding.  Pneumococcal vaccine status: Up to date  Covid-19 vaccine status: Declined, Education has been provided regarding the importance of this vaccine but patient still declined. Advised may receive this vaccine at local pharmacy or Health Dept.or vaccine clinic. Aware to provide a copy of the vaccination record if obtained from local pharmacy or Health Dept. Verbalized acceptance and understanding.  Qualifies for Shingles Vaccine? Yes   Zostavax completed No   Shingrix Completed?: No.    Education has been provided regarding the importance of this vaccine. Patient has been advised to call insurance company to determine out of pocket expense if they have not yet received this vaccine. Advised may  also receive vaccine at local pharmacy or Health Dept. Verbalized acceptance and understanding.  Screening Tests Health Maintenance  Topic Date Due   Zoster Vaccines- Shingrix (1 of 2) Never done   COVID-19 Vaccine (1 - 2023-24 season) Never done   INFLUENZA VACCINE  04/25/2023 (Originally 08/26/2022)   MAMMOGRAM  05/11/2023   Medicare Annual Wellness (AWV)  12/28/2023   Fecal DNA (Cologuard)  03/03/2024   DEXA SCAN  05/11/2027   DTaP/Tdap/Td (3 - Tdap) 05/31/2029   Pneumonia Vaccine 78+ Years old  Completed   Hepatitis C Screening  Completed   HPV VACCINES  Aged Out    Health Maintenance  Health Maintenance Due  Topic Date Due   Zoster Vaccines- Shingrix (1 of 2) Never done   COVID-19 Vaccine (1 - 2023-24 season) Never done    Colorectal cancer screening: Type of screening: Cologuard. Completed 03/03/21. Repeat every 3 years  Mammogram status: Completed 05/11/22. Repeat every year  Bone Density status: Completed 05/11/22. Results reflect: Bone density results: OSTEOPENIA. Repeat every 5 years.  Lung Cancer Screening: (Low Dose CT Chest recommended if Age 26-80 years, 20 pack-year currently smoking OR have quit w/in 15years.) does not qualify.    Additional Screening:  Hepatitis C Screening: does qualify; Completed 09/05/20  Vision Screening: Recommended annual ophthalmology exams for early detection of glaucoma and other disorders of the eye. Is the patient up to date with their annual eye exam?  Yes  Who is the provider or what is the name of the office in which the patient attends annual eye exams? [facility] If pt is not established with a provider, would they like to be referred to a provider to establish care? No .   Dental Screening: Recommended annual dental exams for proper oral hygiene   Community Resource Referral / Chronic Care Management: CRR required this visit?  No   CCM required this visit?  No     Plan:     I have personally reviewed and noted the  following in the patient's chart:   Medical and social history Use of alcohol, tobacco or illicit drugs  Current medications and supplements including opioid prescriptions. Patient is not currently taking opioid prescriptions. Functional ability and status Nutritional status Physical activity Advanced directives List of other physicians Hospitalizations, surgeries, and ER visits in previous 12 months Vitals Screenings to include cognitive, depression, and falls Referrals and appointments  In addition, I have reviewed and discussed with patient certain preventive protocols, quality metrics, and best practice recommendations. A written personalized care plan for preventive services as well as general preventive health recommendations were provided to patient.     Hal Hope, LPN   32/02/252   After Visit Summary: (MyChart) Due to this being a telephonic visit, the after visit summary with patients personalized plan was offered to patient via MyChart   Nurse Notes: none

## 2022-12-29 DIAGNOSIS — I10 Essential (primary) hypertension: Secondary | ICD-10-CM | POA: Diagnosis not present

## 2022-12-29 DIAGNOSIS — N1831 Chronic kidney disease, stage 3a: Secondary | ICD-10-CM | POA: Diagnosis not present

## 2022-12-29 DIAGNOSIS — I129 Hypertensive chronic kidney disease with stage 1 through stage 4 chronic kidney disease, or unspecified chronic kidney disease: Secondary | ICD-10-CM | POA: Diagnosis not present

## 2022-12-31 DIAGNOSIS — M25551 Pain in right hip: Secondary | ICD-10-CM | POA: Diagnosis not present

## 2023-01-13 ENCOUNTER — Other Ambulatory Visit: Payer: Self-pay | Admitting: Family Medicine

## 2023-01-13 DIAGNOSIS — I10 Essential (primary) hypertension: Secondary | ICD-10-CM

## 2023-01-13 NOTE — Telephone Encounter (Signed)
Please advise 

## 2023-02-07 ENCOUNTER — Ambulatory Visit: Payer: Self-pay

## 2023-02-07 NOTE — Telephone Encounter (Signed)
 Chief Complaint: Animal bite Symptoms: rat bite on left hand x 2  Frequency: 1 episode yesterday  Pertinent Negatives: Patient denies fever, redness, swelling, warm to touch, any signs of infection  Disposition: [] ED /[] Urgent Care (no appt availability in office) / [] Appointment(In office/virtual)/ []  [facility]/ [] Home Care/ [] Refused Recommended Disposition /[] [facility]/ [x]  Follow-up with PCP Additional Notes: Patient states there was a rat in the house and she picked it up to throw it outside and the rat bite her left hand two times. Patient stated once on a knuckle and once near the 5th digit. Patient states she washed the hand well and raised it with alcohol for about 5 to 10 minutes to cleanse the area. Patient denies fever, redness, pain, swelling, warm to touch and any other signs of infection. Care advice was given and patient was offered an appointment tomorrow with PCP due to it not being a pet. Patient declined the appointment stating she has a home health nurse coming out tomorrow and will have her assess the area if she thinks the patient needs an appointment she will call back.   Reason for Disposition  [1] Puncture wound or small cut AND [2] on hands or genitals  (Exception: Puncture  from small pet such as gerbil, mouse, hamster, puppy or turtle.)  Answer Assessment - Initial Assessment Questions 1. ANIMAL: What type of animal caused the bite? Is the injury from a bite or a claw? If the animal is a dog or a cat, ask: Was it a pet or a stray? Was it acting ill or behaving strangely?     Rat 2. LOCATION: Where is the bite located?      Left hand knuckle and a finger 3. SIZE: How big is the bite? What does it look like?      Small, it looks okay  4. ONSET: When did the bite happen? (Minutes or hours ago)      Yesterday  5. CIRCUMSTANCES: Tell me how this happened.      I pick up the rat to throw it out of the house and it bite me twice   6. TETANUS: When was your last tetanus booster?     It has been least than 10 years  7. RABIES VACCINE: For dog or cat bites, ask: Do you know if the pet is vaccinated against rabies?  (e.g., yes, no, overdue for rabies shot, unknown)     It was a Rat  8. PREGNANCY: Is there any chance you are pregnant? When was your last menstrual period?     No  Protocols used: Animal Bite-A-AH

## 2023-02-08 ENCOUNTER — Ambulatory Visit: Payer: 59 | Admitting: Family Medicine

## 2023-02-08 NOTE — Telephone Encounter (Signed)
 Pt advised. Reports she is waiting on a nurse to come out this afternoon

## 2023-02-08 NOTE — Telephone Encounter (Signed)
 Pt called regarding where to go for rabies shot. Pt has nurse coming out this afternoon to evaluate need for rabies shot, and coordinate care if needed.

## 2023-02-08 NOTE — Telephone Encounter (Signed)
 She may need rabies treatment given that it was a wild animal/rodent (this is done through the ED). Also needs to make sure TDAP is up to date.

## 2023-02-10 ENCOUNTER — Ambulatory Visit: Payer: 59 | Attending: Cardiovascular Disease | Admitting: Cardiovascular Disease

## 2023-02-10 VITALS — BP 130/78 | HR 51 | Ht 67.0 in | Wt 191.0 lb

## 2023-02-10 DIAGNOSIS — I10 Essential (primary) hypertension: Secondary | ICD-10-CM

## 2023-02-10 DIAGNOSIS — E785 Hyperlipidemia, unspecified: Secondary | ICD-10-CM | POA: Diagnosis not present

## 2023-02-10 DIAGNOSIS — I340 Nonrheumatic mitral (valve) insufficiency: Secondary | ICD-10-CM

## 2023-02-10 DIAGNOSIS — I480 Paroxysmal atrial fibrillation: Secondary | ICD-10-CM | POA: Diagnosis not present

## 2023-02-10 NOTE — Progress Notes (Signed)
Cardiology Office Note   Date:  02/10/2023   ID:  Whitney Stuart, DOB [birth year], MRN [id number]  PCP:  Erasmo Downer, MD  Cardiologist:   Lorine Bears, MD   No chief complaint on file.     History of Present Illness: Whitney Stuart is a 73 y.o. female who is here today for follow-up visit regarding mitral regurgitation and paroxysmal atrial fibrillation  She has known history of essential hypertension, hyperlipidemia and secondhand smoking.  She is a lifelong non-smoker but has been exposed to secondhand smoking from her husband who smoked for many years.  She was evaluated in October 2021 for chest pain and shortness of breath.  Echocardiogram showed normal LV systolic function, grade 1 diastolic dysfunction, mild to moderate mitral regurgitation and mild to moderate pulmonary hypertension with peak systolic pressure of 47 mmHg.  Cardiac CTA showed a calcium score of 0 with normal coronary arteries. Home sleep study in June 2022 was normal.  She was found to have A-fib with RVR in April 2024.  She was placed on anticoagulation with Eliquis and metoprolol for rate control.  Metoprolol was subsequently decreased after she converted to sinus rhythm with bradycardia. She had a syncopal episode in December when she stood up quickly at night after she took Flexeril.  No recurrent symptoms.  She is not dizzy now.   Past Medical History:  Diagnosis Date   Arthritis    Asthma    Asthma    Chest pain    a. 11/2019 Cor CTA: Calcium score equals 0.  Normal coronary arteries.   Diastolic dysfunction    a. 11/2019 Echo: EF 60-65%.  Grade 1 diastolic dysfunction; b. 12/2021 Echo: EF 60-65%, GrI DD; c. 05/2022 Echo: EF 60-65%, no rwma. Nl RV fxn. RVSP 43.61mmHg. Sev dil LA. Mod MR.   GERD (gastroesophageal reflux disease)    History of cervical cancer    Hyperlipidemia    Hypertension    Mitral regurgitation    a. 12/22023 Echo: Mild-mod MR; b. 05/2022 Echo: Mod MR.   PAH  (pulmonary artery hypertension) (HCC)    a. 11/2019 Echo: RVSP ; b .12/2021 Echo: RVSP 39.39mmHg; c. 05/2022 Echo: RVSP 43.79mmHg.   Persistent atrial fibrillation (HCC)    a. Dx 04/2022-->Eliquis (CHA2DS2VASc = 3); b. 06/2022 Zio: predominantly sinus rhythm at a rate of 66 bpm (range 44-154), 2 brief runs of SVT (fastest 154 bpm x 20 beats) and otherwise rare PACs and PVCs.   Sleep-disordered breathing     Past Surgical History:  Procedure Laterality Date   ABDOMINAL HYSTERECTOMY  1999   total   HAND SURGERY Bilateral 2003   carpal tunnel   LIPOMA EXCISION       Current Outpatient Medications  Medication Sig Dispense Refill   albuterol (VENTOLIN HFA) 108 (90 Base) MCG/ACT inhaler Inhale 2 puffs into the lungs every 6 (six) hours as needed for wheezing or shortness of breath. 8.5 each 3   apixaban (ELIQUIS) 5 MG TABS tablet Take 1 tablet (5 mg total) by mouth 2 (two) times daily. 60 tablet 11   BREO ELLIPTA 200-25 MCG/ACT AEPB INHALE 1 PUFF BY MOUTH EVERY DAY 180 each 0   celecoxib (CELEBREX) 200 MG capsule Take 200 mg by mouth daily. PRN     clonazePAM (KLONOPIN) 0.5 MG tablet Take 0.5-1 tablets (0.25-0.5 mg total) by mouth 2 (two) times daily as needed for anxiety. 15 tablet 0   cyclobenzaprine (FLEXERIL) 5 MG tablet TAKE 1  TABLET BY MOUTH EVERYDAY AT BEDTIME 30 tablet 0   lisinopril (ZESTRIL) 10 MG tablet Take 10 mg by mouth daily.     loratadine (CLARITIN) 10 MG tablet Take 10 mg by mouth daily.     metoprolol tartrate (LOPRESSOR) 25 MG tablet Take 0.5 tablets (12.5 mg total) by mouth 2 (two) times daily. 90 tablet 3   rosuvastatin (CRESTOR) 5 MG tablet TAKE 1 TABLET (5 MG TOTAL) BY MOUTH DAILY. STOP ATORVASTATIN 90 tablet 3   sertraline (ZOLOFT) 50 MG tablet Take 1 tablet (50 mg total) by mouth every other day. (Patient taking differently: Take 50 mg by mouth daily.) 45 tablet 3   No current facility-administered medications for this visit.    Allergies:   Patient has no known  allergies.    Social History:  The patient  reports that she quit smoking about 26 years ago. Her smoking use included cigarettes. She started smoking about 32 years ago. She has a 3 pack-year smoking history. She has never used smokeless tobacco. She reports that she does not drink alcohol and does not use drugs.   Family History:  The patient's family history includes Congestive Heart Failure in her mother; Heart disease in her brother and mother; Heart murmur in her sister; Hypertension in her brother, brother, brother, brother, brother, brother, brother, brother, father, mother, sister, sister, and sister; Kidney disease in her father; Lung cancer in her brother; Mental illness in her maternal aunt; Ovarian cancer in her sister; Sleep apnea in her brother; Stroke in her mother.    ROS:  Please see the history of present illness.   Otherwise, review of systems are positive for none.   All other systems are reviewed and negative.    PHYSICAL EXAM: VS:  BP 130/78 (BP Location: Left Arm, Patient Position: Sitting)   Pulse (!) 51   Ht 5\' 7"  (1.702 m)   Wt 191 lb (86.6 kg)   SpO2 95%   BMI 29.91 kg/m  , BMI Body mass index is 29.91 kg/m. GEN: Well nourished, well developed, in no acute distress  HEENT: normal  Neck: no JVD, carotid bruits, or masses Cardiac: Regular rate and rhythm and mildly bradycardic; no  rubs, or gallops.  1/ 6 systolic murmur in the aortic area.  Trace bilateral leg edema. Respiratory:  clear to auscultation bilaterally, normal work of breathing GI: soft, nontender, nondistended, + BS MS: no deformity or atrophy  Skin: warm and dry, no rash Neuro:  Strength and sensation are intact Psych: euthymic mood, full affect   EKG:  EKG is ordered today. The ekg ordered today demonstrates : Sinus bradycardia Low voltage QRS When compared with ECG of 10-Nov-2022 14:47, No significant change was found    Recent Labs: 08/23/2022: ALT 13; BUN 23; Creatinine, Ser 1.30;  Hemoglobin 12.2; Platelets 354; Potassium 5.2; Sodium 141; TSH 1.380    Lipid Panel    Component Value Date/Time   CHOL 191 08/23/2022 1303   TRIG 87 08/23/2022 1303   HDL 75 08/23/2022 1303   CHOLHDL 2.5 08/23/2022 1303   LDLCALC 100 (H) 08/23/2022 1303      Wt Readings from Last 3 Encounters:  02/10/23 191 lb (86.6 kg)  11/10/22 190 lb 9.6 oz (86.5 kg)  08/20/22 188 lb 9.6 oz (85.5 kg)          11/22/2019   11:21 AM  PAD Screen  Previous PAD dx? No  Previous surgical procedure? No  Pain with walking? No  Feet/toe  relief with dangling? No  Painful, non-healing ulcers? No  Extremities discolored? No      ASSESSMENT AND PLAN:  1.  Paroxysmal atrial fibrillation: [place] Vascor is 4.  Continue anticoagulation with Eliquis 5 mg twice daily.  She does have underlying chronic kidney disease but her creatinine is below 1.5.  Continue small dose metoprolol 12.5 mg twice daily.  2.  Essential hypertension: Blood pressure is well-controlled on current medications.  3.  Hyperlipidemia: Currently on small dose atorvastatin 10 mg daily.  Most recent lipid profile showed an LDL of 100.  4.  Mitral regurgitation: Most recent echocardiogram in May 2024 showed moderate regurgitation.  Recommend repeat echocardiogram in 2026.    Disposition:   FU in 6 months  Signed,  Lorine Bears, MD  02/10/2023 3:24 PM    [facility]

## 2023-02-10 NOTE — Patient Instructions (Signed)

## 2023-02-21 ENCOUNTER — Ambulatory Visit: Payer: 59 | Admitting: Family Medicine

## 2023-02-21 ENCOUNTER — Ambulatory Visit (INDEPENDENT_AMBULATORY_CARE_PROVIDER_SITE_OTHER): Payer: 59 | Admitting: Family Medicine

## 2023-02-21 VITALS — BP 117/80 | HR 55 | Ht 67.0 in | Wt 192.0 lb

## 2023-02-21 DIAGNOSIS — N1831 Chronic kidney disease, stage 3a: Secondary | ICD-10-CM | POA: Diagnosis not present

## 2023-02-21 DIAGNOSIS — I10 Essential (primary) hypertension: Secondary | ICD-10-CM | POA: Diagnosis not present

## 2023-02-21 DIAGNOSIS — E782 Mixed hyperlipidemia: Secondary | ICD-10-CM | POA: Diagnosis not present

## 2023-02-21 DIAGNOSIS — D692 Other nonthrombocytopenic purpura: Secondary | ICD-10-CM

## 2023-02-21 DIAGNOSIS — E66811 Obesity, class 1: Secondary | ICD-10-CM

## 2023-02-21 DIAGNOSIS — I2721 Secondary pulmonary arterial hypertension: Secondary | ICD-10-CM

## 2023-02-21 DIAGNOSIS — Z23 Encounter for immunization: Secondary | ICD-10-CM

## 2023-02-21 DIAGNOSIS — F411 Generalized anxiety disorder: Secondary | ICD-10-CM

## 2023-02-21 MED ORDER — CLONAZEPAM 0.5 MG PO TABS: 0.2500 mg | ORAL_TABLET | Freq: Two times a day (BID) | ORAL | 0 refills | Status: DC | PRN

## 2023-02-21 MED ORDER — SERTRALINE HCL 50 MG PO TABS: 50.0000 mg | ORAL_TABLET | Freq: Every day | ORAL | 1 refills | Status: DC

## 2023-02-21 MED ORDER — APIXABAN 5 MG PO TABS: 5.0000 mg | ORAL_TABLET | Freq: Two times a day (BID) | ORAL | 3 refills | Status: DC

## 2023-02-21 NOTE — Assessment & Plan Note (Signed)
Chronic and stable.

## 2023-02-21 NOTE — Progress Notes (Signed)
Established patient visit   Patient: Whitney Stuart   DOB: 05/29/1950   73 y.o. Female  MRN: [id number] Visit Date: 02/21/2023  Today's healthcare provider: Shirlee Latch, MD   Chief Complaint  Patient presents with   Follow-up    6 month for chronic disease f/u. Dizziness spell or off balance especially when standing, laying down. Pt fell in December 2024 and hit the head.   Subjective    HPI HPI     Follow-up    Additional comments: 6 month for chronic disease f/u. Dizziness spell or off balance especially when standing, laying down. Pt fell in December 2024 and hit the head.      Last edited by Shelly Bombard, CMA on 02/21/2023  8:50 AM.       Discussed the use of AI scribe software for clinical note transcription with the patient, who gave verbal consent to proceed.  History of Present Illness   The patient, with a history of hypertension and hyperlipidemia, presents for a six-month follow-up. They report a recent mouse bite, which they cleaned and has healed without complications. They also mention a fall in December, where they hit their head but did not lose consciousness or experience any subsequent issues. The patient's home blood pressure readings have been satisfactory, with occasional dips. The patient's anxiety is well-managed with daily Zoloft, and they request a stronger medication for occasional use. The patient also requests a refill of their Eliquis prescription.         Medications: Outpatient Medications Prior to Visit  Medication Sig   albuterol (VENTOLIN HFA) 108 (90 Base) MCG/ACT inhaler Inhale 2 puffs into the lungs every 6 (six) hours as needed for wheezing or shortness of breath.   BREO ELLIPTA 200-25 MCG/ACT AEPB INHALE 1 PUFF BY MOUTH EVERY DAY   celecoxib (CELEBREX) 200 MG capsule Take 200 mg by mouth daily. PRN   cyclobenzaprine (FLEXERIL) 5 MG tablet TAKE 1 TABLET BY MOUTH EVERYDAY AT BEDTIME   lisinopril (ZESTRIL) 10 MG tablet Take  10 mg by mouth daily.   loratadine (CLARITIN) 10 MG tablet Take 10 mg by mouth daily.   metoprolol tartrate (LOPRESSOR) 25 MG tablet Take 0.5 tablets (12.5 mg total) by mouth 2 (two) times daily.   rosuvastatin (CRESTOR) 5 MG tablet TAKE 1 TABLET (5 MG TOTAL) BY MOUTH DAILY. STOP ATORVASTATIN   [DISCONTINUED] apixaban (ELIQUIS) 5 MG TABS tablet Take 1 tablet (5 mg total) by mouth 2 (two) times daily.   [DISCONTINUED] clonazePAM (KLONOPIN) 0.5 MG tablet Take 0.5-1 tablets (0.25-0.5 mg total) by mouth 2 (two) times daily as needed for anxiety.   [DISCONTINUED] sertraline (ZOLOFT) 50 MG tablet Take 1 tablet (50 mg total) by mouth every other day. (Patient taking differently: Take 50 mg by mouth daily.)   No facility-administered medications prior to visit.    Review of Systems     Objective    BP 117/80 Comment: home reading  Pulse (!) 55   Ht 5\' 7"  (1.702 m)   Wt 192 lb (87.1 kg)   SpO2 97%   BMI 30.07 kg/m    Physical Exam Vitals reviewed.  Constitutional:      General: She is not in acute distress.    Appearance: Normal appearance. She is well-developed. She is not diaphoretic.  HENT:     Head: Normocephalic and atraumatic.  Eyes:     General: No scleral icterus.    Conjunctiva/sclera: Conjunctivae normal.  Neck:  Thyroid: No thyromegaly.  Cardiovascular:     Rate and Rhythm: Normal rate and regular rhythm.     Heart sounds: Normal heart sounds. No murmur heard. Pulmonary:     Effort: Pulmonary effort is normal. No respiratory distress.     Breath sounds: Normal breath sounds. No wheezing, rhonchi or rales.  Musculoskeletal:     Cervical back: Neck supple.     Right lower leg: No edema.     Left lower leg: No edema.  Lymphadenopathy:     Cervical: No cervical adenopathy.  Skin:    General: Skin is warm and dry.     Findings: No rash.  Neurological:     Mental Status: She is alert and oriented to person, place, and time. Mental status is at baseline.   Psychiatric:        Mood and Affect: Mood normal.        Behavior: Behavior normal.      No results found for any visits on 02/21/23.  Assessment & Plan     Problem List Items Addressed This Visit       Cardiovascular and Mediastinum   Essential (primary) hypertension - Primary   Relevant Medications   apixaban (ELIQUIS) 5 MG TABS tablet   Other Relevant Orders   Comprehensive metabolic panel   Senile purpura (HCC)   Relevant Medications   apixaban (ELIQUIS) 5 MG TABS tablet   PAH (pulmonary artery hypertension) (HCC)   Relevant Medications   apixaban (ELIQUIS) 5 MG TABS tablet     Genitourinary   Chronic kidney disease, stage 3a (HCC)   Relevant Orders   Comprehensive metabolic panel     Other   Obesity (BMI 30.0-34.9)   Hyperlipidemia   Relevant Medications   apixaban (ELIQUIS) 5 MG TABS tablet   Other Relevant Orders   Comprehensive metabolic panel   Lipid panel   GAD (generalized anxiety disorder)   Relevant Medications   sertraline (ZOLOFT) 50 MG tablet   Other Visit Diagnoses       Encounter for immunization       Relevant Orders   Flu Vaccine Trivalent High Dose (Fluad) (Completed)          General Health Maintenance Routine health maintenance visit. Up to date on most vaccinations. No history of diabetes despite erroneous chart entry. Discussed importance of annual flu vaccination and current flu season extending into May. - Administer flu shot today - Order labs for kidney and liver function - Schedule follow-up physical in six months  Follow-up - Schedule follow-up physical in six months.       Return in about 6 months (around 08/21/2023) for CPE.       Shirlee Latch, MD  [facility] [phone] (phone) [phone] (fax)  [facility]

## 2023-02-21 NOTE — Assessment & Plan Note (Signed)
Chronic hyperlipidemia, managed with Crestor 5 mg daily. No issues with medication adherence or side effects. Discussed importance of regular cholesterol monitoring. - Continue Crestor 5 mg daily - Order cholesterol panel

## 2023-02-21 NOTE — Assessment & Plan Note (Signed)
Discussed importance of healthy weight management Discussed diet and exercise

## 2023-02-21 NOTE — Assessment & Plan Note (Signed)
Chronic hypertension, managed with lisinopril 10 mg daily and metoprolol 12.5 mg twice daily. Home blood pressure readings generally within acceptable range (113-119 mmHg systolic), occasional lower readings (95 mmHg) appear to be outliers. No recent falls or syncopal episodes since December. Discussed importance of monitoring blood pressure at home and reporting significant changes. - Continue lisinopril 10 mg daily - Continue metoprolol 12.5 mg twice daily - Monitor blood pressure at home and report significant changes

## 2023-02-21 NOTE — Assessment & Plan Note (Signed)
Generalized anxiety disorder, managed with Zoloft 50 mg daily. Good control of symptoms with current regimen. Occasional use of Klonopin as needed for severe anxiety. Discussed importance of medication adherence and having a supply of as-needed medication for severe anxiety episodes. - Continue Zoloft 50 mg daily - Send refill for Klonopin for as-needed use

## 2023-02-21 NOTE — Assessment & Plan Note (Signed)
Recheck metabolic panel.

## 2023-02-22 ENCOUNTER — Encounter: Payer: Self-pay | Admitting: Family Medicine

## 2023-02-22 LAB — COMPREHENSIVE METABOLIC PANEL
ALT: 11 [IU]/L (ref 0–32)
AST: 7 [IU]/L (ref 0–40)
Albumin: 4.1 g/dL (ref 3.8–4.8)
Alkaline Phosphatase: 77 [IU]/L (ref 44–121)
BUN/Creatinine Ratio: 21 (ref 12–28)
BUN: 29 mg/dL — ABNORMAL HIGH (ref 8–27)
Bilirubin Total: <0.2 mg/dL (ref 0.0–1.2)
CO2: 20 mmol/L (ref 20–29)
Calcium: 8.9 mg/dL (ref 8.7–10.3)
Chloride: 106 mmol/L (ref 96–106)
Creatinine, Ser: 1.4 mg/dL — ABNORMAL HIGH (ref 0.57–1.00)
Globulin, Total: 2.6 g/dL (ref 1.5–4.5)
Glucose: 96 mg/dL (ref 70–99)
Potassium: 4.7 mmol/L (ref 3.5–5.2)
Sodium: 140 mmol/L (ref 134–144)
Total Protein: 6.7 g/dL (ref 6.0–8.5)
eGFR: 40 mL/min/{1.73_m2} — ABNORMAL LOW (ref >59)

## 2023-02-22 LAB — LIPID PANEL
Chol/HDL Ratio: 3 {ratio} (ref 0.0–4.4)
Cholesterol, Total: 161 mg/dL (ref 100–199)
HDL: 53 mg/dL (ref >39)
LDL Chol Calc (NIH): 86 mg/dL (ref 0–99)
Triglycerides: 122 mg/dL (ref 0–149)
VLDL Cholesterol Cal: 22 mg/dL (ref 5–40)

## 2023-03-16 ENCOUNTER — Other Ambulatory Visit: Payer: Self-pay | Admitting: *Deleted

## 2023-03-16 MED ORDER — METOPROLOL TARTRATE 25 MG PO TABS: 12.5000 mg | ORAL_TABLET | Freq: Two times a day (BID) | ORAL | 2 refills | Status: DC

## 2023-03-17 ENCOUNTER — Telehealth: Payer: Self-pay | Admitting: Family Medicine

## 2023-03-17 DIAGNOSIS — J4520 Mild intermittent asthma, uncomplicated: Secondary | ICD-10-CM

## 2023-03-17 DIAGNOSIS — E782 Mixed hyperlipidemia: Secondary | ICD-10-CM

## 2023-03-17 NOTE — Telephone Encounter (Signed)
[facility] faxed refill request for the following medications:   apixaban (ELIQUIS) 5 MG TABS tablet    clonazePAM (KLONOPIN) 0.5 MG tablet    rosuvastatin (CRESTOR) 5 MG tablet    amLODipine (NORVASC) 10 MG tablet    cyclobenzaprine (FLEXERIL) 5 MG tablet    methylPREDNISolone (MEDROL DOSEPAK) 4 MG TBPK tablet    sertraline (ZOLOFT) 50 MG tablet     BREO ELLIPTA 200-25 MCG/ACT AEPB     lisinopril (ZESTRIL) 10 MG tablet    meloxicam (MOBIC) 7.5 MG tablet     Please advise.

## 2023-03-18 MED ORDER — FLUTICASONE FUROATE-VILANTEROL 200-25 MCG/ACT IN AEPB
1.0000 | INHALATION_SPRAY | Freq: Every day | RESPIRATORY_TRACT | 0 refills | Status: DC
Start: 2023-03-18 — End: 2023-04-05

## 2023-03-18 NOTE — Addendum Note (Signed)
Addended by: Lorelei Pont D on: 03/18/2023 02:53 PM   Modules accepted: Orders

## 2023-03-18 NOTE — Telephone Encounter (Signed)
Breo refilled. Please review additional request.

## 2023-03-21 MED ORDER — CYCLOBENZAPRINE HCL 5 MG PO TABS: 5.0000 mg | ORAL_TABLET | Freq: Every day | ORAL | 2 refills | Status: DC

## 2023-03-21 MED ORDER — CLONAZEPAM 0.5 MG PO TABS
0.2500 mg | ORAL_TABLET | Freq: Two times a day (BID) | ORAL | 0 refills | Status: DC | PRN
Start: 2023-03-21 — End: 2023-04-07

## 2023-03-25 ENCOUNTER — Telehealth: Payer: Self-pay

## 2023-03-25 NOTE — Telephone Encounter (Signed)
 Copied from CRM [id number]. Topic: Clinical - Prescription Issue >> Mar 25, 2023 11:30 AM Payton Doughty wrote: Reason for CRM: Fredna Dow w/ [facility] called t ask that all pt's refills be sent to them.  Advised to have the pt call and let us know what sh needs at this time and if OK to send [facility].

## 2023-03-31 ENCOUNTER — Telehealth: Payer: Self-pay | Admitting: Family Medicine

## 2023-03-31 DIAGNOSIS — J4520 Mild intermittent asthma, uncomplicated: Secondary | ICD-10-CM

## 2023-03-31 DIAGNOSIS — E782 Mixed hyperlipidemia: Secondary | ICD-10-CM

## 2023-03-31 DIAGNOSIS — I10 Essential (primary) hypertension: Secondary | ICD-10-CM

## 2023-03-31 NOTE — Telephone Encounter (Signed)
 Received fax from [facility] requesting refill authorizations on the following:  Sertraline HCL  Breo Ellipta INH Lisinopril  Amlodipine Cyclobenzaprine  Methylpredsinolone tab

## 2023-03-31 NOTE — Telephone Encounter (Signed)
 [facility] faxed refill request for the following medications:   apixaban (ELIQUIS) 5 MG TABS tablet   clonazePAM (KLONOPIN) 0.5 MG tablet   rosuvastatin (CRESTOR) 5 MG tablet    Please advise

## 2023-04-04 NOTE — Telephone Encounter (Signed)
 Received another fax from [facility] requesting the following medications  Amlodipine  Cyclobenzaprine  Methyiprednisolone  fluticasone furoate-vilanterol (BREO ELLIPTA) 200-25 MCG/ACT AEPB   sertraline (ZOLOFT) 50 MG tablet    lisinopril (ZESTRIL) 10 MG tablet   apixaban (ELIQUIS) 5 MG TABS tablet    clonazePAM (KLONOPIN) 0.5 MG tablet    rosuvastatin (CRESTOR) 5 MG tablet   Meloxicam

## 2023-04-05 MED ORDER — APIXABAN 5 MG PO TABS: 5.0000 mg | ORAL_TABLET | Freq: Two times a day (BID) | ORAL | 3 refills | Status: DC

## 2023-04-05 MED ORDER — ROSUVASTATIN CALCIUM 5 MG PO TABS: ORAL_TABLET | ORAL | 3 refills | Status: DC

## 2023-04-05 MED ORDER — SERTRALINE HCL 50 MG PO TABS: 50.0000 mg | ORAL_TABLET | Freq: Every day | ORAL | 1 refills | Status: DC

## 2023-04-05 MED ORDER — FLUTICASONE FUROATE-VILANTEROL 200-25 MCG/ACT IN AEPB: 1.0000 | INHALATION_SPRAY | Freq: Every day | RESPIRATORY_TRACT | 0 refills | Status: DC

## 2023-04-05 NOTE — Addendum Note (Signed)
 Addended by: Lily Kocher on: 04/05/2023 08:47 AM   Modules accepted: Orders

## 2023-04-07 MED ORDER — CYCLOBENZAPRINE HCL 5 MG PO TABS: 5.0000 mg | ORAL_TABLET | Freq: Every day | ORAL | 2 refills | Status: DC

## 2023-04-07 MED ORDER — LISINOPRIL 10 MG PO TABS: 10.0000 mg | ORAL_TABLET | Freq: Every day | ORAL | 1 refills | Status: DC

## 2023-04-07 MED ORDER — CLONAZEPAM 0.5 MG PO TABS: 0.2500 mg | ORAL_TABLET | Freq: Two times a day (BID) | ORAL | 0 refills | Status: DC | PRN

## 2023-04-07 NOTE — Telephone Encounter (Signed)
 Amlodipine, methylpred, and meloxicam are not her current meds - do not send  Cyclobenzaprine and klonopin just sent a few weeks ago  Ok to fill Lisinopril #90 r1

## 2023-04-07 NOTE — Addendum Note (Signed)
 Addended by: Erasmo Downer on: 04/07/2023 04:00 PM   Modules accepted: Orders

## 2023-04-07 NOTE — Addendum Note (Signed)
 Addended by: Lily Kocher on: 04/07/2023 03:40 PM   Modules accepted: Orders

## 2023-04-07 NOTE — Telephone Encounter (Signed)
 Can the following medications (Cyclobenzaprine and klonopin) be sent via [facility] delivery? Previously sent to local pharmacy   Rx for Lisinopril sent

## 2023-05-20 ENCOUNTER — Ambulatory Visit: Payer: Self-pay

## 2023-05-20 NOTE — Telephone Encounter (Signed)
  Chief Complaint: off balance, tingling in fingers Symptoms: tingling, off balance Frequency: intermittent, chronic Pertinent Negatives: Patient denies chest pain, headache, vision changes Disposition: [] ED /[] Urgent Care (no appt availability in office) / [x] Appointment(In office/virtual)/ []  [facility] [facility]/ [] Home Care/ [] Refused Recommended Disposition /[] [place] [facility]/ []  Follow-up with PCP Additional Notes:  For about  two weeks she has been experiencing finger tingling constantly,  left hand all digits, right hand 3 digits. Feels off balance at times, but not dizzy or lightheaded, if she bends over she will feel like she will keep going until fall, feels drunk when walking up the stairs. Has not fallen. Acute visit advised, no acute visits available in practice until May 14th. Aware to await return call from office for scheduling. Educated on care advice as documented in protocol, patient verbalized understanding. Discussed reasons to call back.    Message from [place] F sent at 05/20/2023  1:02 PM EDT  Summary: Numbness   Copied From CRM [id number]. Reason for Triage: Patient is calling in because she has been experiencing numbness and tingling in her fingers. She also stated when she goes up the steps she gets a weird feeling. She said it's not dizziness, but she feels off balanced.      Reason for Disposition  [1] Numbness or tingling on both sides of body AND [2] is a new symptom present > 24 hours  Protocols used: Neurologic Deficit-A-AH

## 2023-05-20 NOTE — Telephone Encounter (Signed)
 Spoke with patient got her scheduled for appt on 4/29 @1 :20pm

## 2023-05-24 ENCOUNTER — Ambulatory Visit
Admission: RE | Admit: 2023-05-24 | Discharge: 2023-05-24 | Disposition: A | Discharge status: Home / Self Care | Source: Ambulatory Visit | Attending: Family Medicine | Admitting: Family Medicine

## 2023-05-24 ENCOUNTER — Encounter: Payer: Self-pay | Admitting: Family Medicine

## 2023-05-24 ENCOUNTER — Ambulatory Visit
Admission: RE | Admit: 2023-05-24 | Discharge: 2023-05-24 | Disposition: A | Discharge status: Home / Self Care | Attending: Family Medicine | Admitting: Family Medicine

## 2023-05-24 ENCOUNTER — Ambulatory Visit (INDEPENDENT_AMBULATORY_CARE_PROVIDER_SITE_OTHER): Admitting: Family Medicine

## 2023-05-24 VITALS — BP 125/73 | HR 59 | Ht 67.0 in | Wt 181.0 lb

## 2023-05-24 DIAGNOSIS — G629 Polyneuropathy, unspecified: Secondary | ICD-10-CM

## 2023-05-24 DIAGNOSIS — M47812 Spondylosis without myelopathy or radiculopathy, cervical region: Secondary | ICD-10-CM | POA: Diagnosis not present

## 2023-05-24 DIAGNOSIS — Z1231 Encounter for screening mammogram for malignant neoplasm of breast: Secondary | ICD-10-CM

## 2023-05-24 DIAGNOSIS — I951 Orthostatic hypotension: Secondary | ICD-10-CM

## 2023-05-24 DIAGNOSIS — R200 Anesthesia of skin: Secondary | ICD-10-CM | POA: Diagnosis not present

## 2023-05-24 DIAGNOSIS — M5412 Radiculopathy, cervical region: Secondary | ICD-10-CM

## 2023-05-24 DIAGNOSIS — M9931 Osseous stenosis of neural canal of cervical region: Secondary | ICD-10-CM | POA: Diagnosis not present

## 2023-05-24 DIAGNOSIS — R202 Paresthesia of skin: Secondary | ICD-10-CM | POA: Diagnosis not present

## 2023-05-24 MED ORDER — GABAPENTIN 300 MG PO CAPS: 300.0000 mg | ORAL_CAPSULE | Freq: Three times a day (TID) | ORAL | 3 refills | Status: DC

## 2023-05-24 NOTE — Progress Notes (Signed)
 Acute visit   Patient: Whitney Stuart   DOB: [birth year]   73 y.o. Female  MRN: [id number] PCP: Mazie Speed, MD   Chief Complaint  Patient presents with   Numbness    Pt is present due to hands and finger being numb for the last 2 weeks. Associated with being off balance. She reports being off balance has become worse as well and tingling. Reports it originally started in her laft hand and is now affecting both.    Subjective    Discussed the use of AI scribe software for clinical note transcription with the patient, who gave verbal consent to proceed.  History of Present Illness   Whitney Stuart is a 73 year old female with a history of carpal tunnel surgery and kidney disease who presents with numbness and tingling in her fingers and lightheadedness.  Numbness and tingling in her fingers have been present for three weeks, initially affecting two fingers and now involving all fingers on both hands. The numbness is constant, with no neck pain, leg numbness, or weakness, and grip strength is maintained. There is no recent trauma or medication changes. She has a history of carpal tunnel surgery on both wrists.  Lightheadedness has increased in frequency over the past few days, particularly noticeable when walking but not when sitting. She stays near objects to prevent falls. She occasionally takes clonazepam  and Flexeril , which can cause dizziness. She experiences shortness of breath with exertion and recalls needing assistance at the park due to lightheadedness.  Her medical history includes kidney disease, managed with nephrology care. She uses home therapy for occasional leg numbness, especially on the right side.        Review of Systems  Objective    BP 125/73 (BP Location: Left Arm, Patient Position: Sitting, Cuff Size: Large)   Pulse (!) 59   Ht 5\' 7"  (1.702 m)   Wt 181 lb (82.1 kg)   SpO2 100%   BMI 28.35 kg/m   Orthostatic Vitals for the past 48 hrs (Last  6 readings):  Patient Position BP Pulse BP Location Cuff Size Patient Position (if appropriate) BP- Standing at 0 minutes Pulse- Standing at 0 minutes BP- Sitting Pulse- Sitting BP- Lying Pulse- Lying  05/24/23 1314 Sitting 125/73 (!) 59 Left Arm Large -- -- -- -- -- -- --  05/24/23 1352 -- -- -- -- -- Orthostatic Vitals 121/63 67 129/79 56 140/77 54     Physical Exam Vitals reviewed.  Constitutional:      General: She is not in acute distress.    Appearance: Normal appearance. She is well-developed. She is not diaphoretic.  HENT:     Head: Normocephalic and atraumatic.  Eyes:     General: No scleral icterus.    Extraocular Movements: Extraocular movements intact.     Conjunctiva/sclera: Conjunctivae normal.     Pupils: Pupils are equal, round, and reactive to light.  Neck:     Thyroid : No thyromegaly.  Cardiovascular:     Rate and Rhythm: Normal rate and regular rhythm.     Pulses: Normal pulses.     Heart sounds: Normal heart sounds. No murmur heard. Pulmonary:     Effort: Pulmonary effort is normal. No respiratory distress.     Breath sounds: Normal breath sounds. No wheezing, rhonchi or rales.  Musculoskeletal:     Cervical back: Neck supple.     Right lower leg: No edema.     Left lower leg:  No edema.  Lymphadenopathy:     Cervical: No cervical adenopathy.  Skin:    General: Skin is warm and dry.     Findings: No rash.  Neurological:     Mental Status: She is alert and oriented to person, place, and time. Mental status is at baseline.     Cranial Nerves: Cranial nerves 2-12 are intact. No dysarthria or facial asymmetry.     Sensory: Sensory deficit (in b/l hands) present.     Motor: Motor function is intact. No weakness, tremor or abnormal muscle tone.     Coordination: Coordination is intact.     Deep Tendon Reflexes: Reflexes are normal and symmetric.  Psychiatric:        Mood and Affect: Mood normal.        Behavior: Behavior normal.       No results found  for any visits on 05/24/23.  Assessment & Plan     Problem List Items Addressed This Visit   None Visit Diagnoses       Neuropathy    -  Primary   Relevant Orders   DG Cervical Spine Complete   B12   TSH   CBC w/Diff/Platelet   Comprehensive metabolic panel with GFR     Cervical radiculopathy       Relevant Medications   gabapentin (NEURONTIN) 300 MG capsule   Other Relevant Orders   DG Cervical Spine Complete   B12   TSH   CBC w/Diff/Platelet   Comprehensive metabolic panel with GFR     Encounter for screening mammogram for malignant neoplasm of breast       Relevant Orders   MM 3D SCREENING MAMMOGRAM BILATERAL BREAST     Orthostasis              Lightheadedness with orthostatic hypotension Lightheadedness for three weeks, exacerbated by walking and positional changes. Orthostatic hypotension confirmed with a significant drop in blood pressure upon changing positions. Differential includes dehydration and electrolyte imbalance. No vertigo symptoms. - Encourage increased hydration and sodium intake - Consider electrolyte-rich drinks like Body Armor - Order blood tests including B12, thyroid , blood counts, kidney and liver function  Shortness of breath Shortness of breath during exertion, such as walking, for three weeks. No recent medication changes, chest pain, or palpitations. f/b cardiology.  Numbness and tingling in hands Bilateral numbness and tingling in fingers for three weeks, starting in two fingers and progressing to all fingers. No neck pain or arm weakness. Differential includes cervical spine issues such as a bulging disc. Previous carpal tunnel surgery noted. No recent trauma or medication changes. The bilateral nature without neck pain suggests a possible large bulging disc or other cervical spine issue. - Order cervical spine x-ray - Prescribe gabapentin 300 mg three times a day - Consider MRI if x-ray is inconclusive - Consider nerve conduction study if  needed  Chronic kidney disease Chronic kidney disease with protein intake management. Hydration and electrolyte balance are important, especially given the orthostatic hypotension. - Encourage balanced hydration and sodium intake       Meds ordered this encounter  Medications   gabapentin (NEURONTIN) 300 MG capsule    Sig: Take 1 capsule (300 mg total) by mouth 3 (three) times daily.    Dispense:  90 capsule    Refill:  3     Return in about 3 months (around 08/23/2023) for as scheduled.      Aden Agreste, MD  [facility]  [phone] (phone) [phone] (fax)  [facility]

## 2023-05-24 NOTE — Addendum Note (Signed)
 Addended by: Mazie Speed on: 05/24/2023 04:52 PM   Modules accepted: Level of Service

## 2023-05-25 ENCOUNTER — Encounter: Payer: Self-pay | Admitting: Family Medicine

## 2023-05-25 LAB — COMPREHENSIVE METABOLIC PANEL WITH GFR
ALT: 14 IU/L (ref 0–32)
AST: 9 IU/L (ref 0–40)
Albumin: 4.6 g/dL (ref 3.8–4.8)
Alkaline Phosphatase: 75 IU/L (ref 44–121)
BUN/Creatinine Ratio: 22 (ref 12–28)
BUN: 26 mg/dL (ref 8–27)
Bilirubin Total: 0.6 mg/dL (ref 0.0–1.2)
CO2: 22 mmol/L (ref 20–29)
Calcium: 10.1 mg/dL (ref 8.7–10.3)
Chloride: 101 mmol/L (ref 96–106)
Creatinine, Ser: 1.16 mg/dL — ABNORMAL HIGH (ref 0.57–1.00)
Globulin, Total: 3 g/dL (ref 1.5–4.5)
Glucose: 95 mg/dL (ref 70–99)
Potassium: 5.3 mmol/L — ABNORMAL HIGH (ref 3.5–5.2)
Sodium: 137 mmol/L (ref 134–144)
Total Protein: 7.6 g/dL (ref 6.0–8.5)
eGFR: 50 mL/min/{1.73_m2} — ABNORMAL LOW (ref >59)

## 2023-05-25 LAB — TSH: TSH: 0.773 u[IU]/mL (ref 0.450–4.500)

## 2023-05-25 LAB — CBC WITH DIFFERENTIAL/PLATELET
Basophils Absolute: 0.1 10*3/uL (ref 0.0–0.2)
Basos: 1 %
EOS (ABSOLUTE): 0.2 10*3/uL (ref 0.0–0.4)
Eos: 2 %
Hematocrit: 38.3 % (ref 34.0–46.6)
Hemoglobin: 12.6 g/dL (ref 11.1–15.9)
Immature Grans (Abs): 0 10*3/uL (ref 0.0–0.1)
Immature Granulocytes: 0 %
Lymphocytes Absolute: 2 10*3/uL (ref 0.7–3.1)
Lymphs: 22 %
MCH: 31.6 pg (ref 26.6–33.0)
MCHC: 32.9 g/dL (ref 31.5–35.7)
MCV: 96 fL (ref 79–97)
Monocytes Absolute: 0.8 10*3/uL (ref 0.1–0.9)
Monocytes: 9 %
Neutrophils Absolute: 6.1 10*3/uL (ref 1.4–7.0)
Neutrophils: 66 %
Platelets: 361 10*3/uL (ref 150–450)
RBC: 3.99 x10E6/uL (ref 3.77–5.28)
RDW: 11.9 % (ref 11.7–15.4)
WBC: 9.1 10*3/uL (ref 3.4–10.8)

## 2023-05-25 LAB — VITAMIN B12: Vitamin B-12: 861 pg/mL (ref 232–1245)

## 2023-05-26 NOTE — Progress Notes (Signed)
 Called pt and gave results as instructed by DR.B, pt demonstrated understanding of this.

## 2023-05-31 ENCOUNTER — Ambulatory Visit: Payer: Self-pay

## 2023-05-31 NOTE — Telephone Encounter (Signed)
 Decrease gabapentin  dose to 100mg  at bedtime #30 r0.  Lower dose and using only at bedtime should help with side effects and then we can slowly increase to help symptoms.  She can let us  know if she is not getting enough relief I na few weeks and we can increase it.

## 2023-05-31 NOTE — Telephone Encounter (Signed)
 Patient called and stated she is not tolerating the gabapentin  well. Patient states it causes her to feel woozy and drunk each time she takes it. Patient is asking if pcp can offer a different prescription. Please advise.  Copied from CRM [id number]. Topic: Clinical - Medication Question >> May 31, 2023 11:32 AM Valeri Gate H wrote: Reason for CRM: Patient says gabapentin  (NEURONTIN ) 300 MG capsule makes her feel drunk, she thinks the dose may be too much for her. Reason for Disposition  [1] Follow-up call from patient regarding patient's clinical status AND [2] information NON-URGENT  Answer Assessment - Initial Assessment Questions 1. REASON FOR CALL or QUESTION: "What is your reason for calling today?" or "How can I best help you?" or "What question do you have that I can help answer?"     Please see notes 2. CALLER: Document the source of call. (e.g., laboratory, patient).     Patient  Protocols used: PCP Call - No Triage-A-AH

## 2023-06-02 MED ORDER — GABAPENTIN 100 MG PO CAPS: 100.0000 mg | ORAL_CAPSULE | Freq: Every day | ORAL | 3 refills | Status: DC

## 2023-06-02 NOTE — Addendum Note (Signed)
 Addended by: Darrow End on: 06/02/2023 11:46 AM   Modules accepted: Orders

## 2023-06-02 NOTE — Telephone Encounter (Signed)
 Pt advised. Verbalized understanding. RX sent

## 2023-06-03 ENCOUNTER — Other Ambulatory Visit: Payer: Self-pay | Admitting: Family Medicine

## 2023-06-06 ENCOUNTER — Encounter: Payer: Self-pay | Admitting: Family Medicine

## 2023-06-07 ENCOUNTER — Other Ambulatory Visit: Payer: Self-pay

## 2023-06-07 ENCOUNTER — Ambulatory Visit: Payer: Self-pay

## 2023-06-07 DIAGNOSIS — M47812 Spondylosis without myelopathy or radiculopathy, cervical region: Secondary | ICD-10-CM

## 2023-06-07 DIAGNOSIS — M5412 Radiculopathy, cervical region: Secondary | ICD-10-CM

## 2023-06-08 ENCOUNTER — Other Ambulatory Visit: Payer: Self-pay | Admitting: Family Medicine

## 2023-06-08 DIAGNOSIS — J4520 Mild intermittent asthma, uncomplicated: Secondary | ICD-10-CM

## 2023-06-08 NOTE — Telephone Encounter (Unsigned)
 Copied from CRM [id number]. Topic: Clinical - Medication Refill >> Jun 08, 2023 11:37 AM Everette C wrote: Medication: albuterol  (VENTOLIN  HFA) 108 (90 Base) MCG/ACT inhaler [[id number]]   Has the patient contacted their pharmacy? Yes (Agent: If no, request that the patient contact the pharmacy for the refill. If patient does not wish to contact the pharmacy document the reason why and proceed with request.) (Agent: If yes, when and what did the pharmacy advise?)  This is the patient's preferred pharmacy:  [id number] - [place], [place] - [address] AT [facility] [address] [place] [address] Phone: [phone] Fax: [phone]   Is this the correct pharmacy for this prescription? Yes If no, delete pharmacy and type the correct one.   Has the prescription been filled recently? Yes  Is the patient out of the medication? Yes  Has the patient been seen for an appointment in the last year OR does the patient have an upcoming appointment? Yes  Can we respond through MyChart? No  Agent: Please be advised that Rx refills may take up to 3 business days. We ask that you follow-up with your pharmacy.

## 2023-06-09 ENCOUNTER — Other Ambulatory Visit: Payer: Self-pay | Admitting: Family Medicine

## 2023-06-09 MED ORDER — ALBUTEROL SULFATE HFA 108 (90 BASE) MCG/ACT IN AERS: 2.0000 | INHALATION_SPRAY | Freq: Four times a day (QID) | RESPIRATORY_TRACT | 2 refills | Status: DC | PRN

## 2023-06-09 NOTE — Telephone Encounter (Signed)
 Requested Prescriptions  Pending Prescriptions Disp Refills   albuterol  (VENTOLIN  HFA) 108 (90 Base) MCG/ACT inhaler 8.5 each 2    Sig: Inhale 2 puffs into the lungs every 6 (six) hours as needed for wheezing or shortness of breath.     Pulmonology:  Beta Agonists 2 Failed - 06/09/2023  3:20 PM      Failed - Valid encounter within last 12 months    Recent Outpatient Visits           2 weeks ago Neuropathy   [place] [facility] [place], Stan Eans, MD       Future Appointments             In 2 months Alvenia Aus, Tia Flowers, MD [facility] [place]   In 2 months Bacigalupo, Stan Eans, MD [facility]            Passed - Last BP in normal range    BP Readings from Last 1 Encounters:  05/24/23 125/73         Passed - Last Heart Rate in normal range    Pulse Readings from Last 1 Encounters:  05/24/23 (!) 59

## 2023-06-09 NOTE — Telephone Encounter (Signed)
 Copied from CRM [id number]. Topic: Clinical - Medication Refill >> Jun 09, 2023  1:15 PM Leory Rands wrote: Medication: gabapentin  (NEURONTIN ) 100 MG capsule [[id number]] Note state phone in but no pharmacy listed. Pt states that pharmacy has not receive  Has the patient contacted their pharmacy? Yes (Agent: If no, request that the patient contact the pharmacy for the refill. If patient does not wish to contact the pharmacy document the reason why and proceed with request.) (Agent: If yes, when and what did the pharmacy advise?)  This is the patient's preferred pharmacy:  [id number] - [place], [place] - [address] AT [facility] [address] [place] [address] Phone: [phone] Fax: [phone]  [id number] - [place], [place] - [address] [place] [place] [address] Phone: [phone] Fax: [phone]  Is this the correct pharmacy for this prescription? Yes If no, delete pharmacy and type the correct one.   Has the prescription been filled recently? Yes  Is the patient out of the medication? Yes  Has the patient been seen for an appointment in the last year OR does the patient have an upcoming appointment? Yes  Can we respond through MyChart? Yes  Agent: Please be advised that Rx refills may take up to 3 business days. We ask that you follow-up with your pharmacy.

## 2023-06-12 ENCOUNTER — Other Ambulatory Visit: Payer: Self-pay | Admitting: Family Medicine

## 2023-06-12 DIAGNOSIS — J4520 Mild intermittent asthma, uncomplicated: Secondary | ICD-10-CM

## 2023-06-13 MED ORDER — GABAPENTIN 100 MG PO CAPS: 100.0000 mg | ORAL_CAPSULE | Freq: Every day | ORAL | 3 refills | Status: DC

## 2023-06-13 NOTE — Telephone Encounter (Signed)
 Called pt //2 pharmacies listed. Called pt and [facility].   Requested Prescriptions  Pending Prescriptions Disp Refills   gabapentin  (NEURONTIN ) 100 MG capsule 30 capsule 3    Sig: Take 1 capsule (100 mg total) by mouth at bedtime.     Neurology: Anticonvulsants - gabapentin  Failed - 06/13/2023 10:19 AM      Failed - Cr in normal range and within 360 days    Creatinine, Ser  Date Value Ref Range Status  05/24/2023 1.16 (H) 0.57 - 1.00 mg/dL Final         Failed - Valid encounter within last 12 months    Recent Outpatient Visits           2 weeks ago Neuropathy   [place] [facility] [place], Stan Eans, MD       Future Appointments             In 1 month Alvenia Aus, Tia Flowers, MD [facility] [place]   In 2 months Bacigalupo, Stan Eans, MD [facility]            Passed - Completed PHQ-2 or PHQ-9 in the last 360 days

## 2023-06-15 ENCOUNTER — Other Ambulatory Visit: Payer: Self-pay | Admitting: Family Medicine

## 2023-06-15 DIAGNOSIS — I10 Essential (primary) hypertension: Secondary | ICD-10-CM

## 2023-06-27 DIAGNOSIS — I129 Hypertensive chronic kidney disease with stage 1 through stage 4 chronic kidney disease, or unspecified chronic kidney disease: Secondary | ICD-10-CM | POA: Diagnosis not present

## 2023-06-27 DIAGNOSIS — I10 Essential (primary) hypertension: Secondary | ICD-10-CM | POA: Diagnosis not present

## 2023-06-27 DIAGNOSIS — N1831 Chronic kidney disease, stage 3a: Secondary | ICD-10-CM | POA: Diagnosis not present

## 2023-06-27 NOTE — Progress Notes (Signed)
 Referring Physician:  Mazie Speed, MD [address] [place],  [place] [address]  Primary Physician:  Whitney Speed, MD  History of Present Illness: 06/28/2023 Whitney Stuart has a history of HTN, pulmonary artery HTN, asthma, CKD stage 3a, obesity, hyperlipidemia.   She notes a gradual decline in her mobility over last 4 months. She was very active and independent prior to this.   She has intermittent neck pain with no arm pain- this pain is tolerable. She has constant numbness/tingling in her hands x 4 weeks. She is dropping things and has weakness in her hands. She has difficulty walking with balance issues- this started when her hands went numb.   She has intermittent LBP that is worse with standing and walking. She has constant pain in right anterior thigh that is worse with standing and walking.  She notes involuntary "jumping" of left leg that is worse at night and prevents her from sleeping. This started about 2 weeks ago. This started after taking gabapentin - she stopped it yesterday.   She she has intermittent numbness and tingling in both legs. She has weakness in both legs.   History of carpal tunnel release bilaterally years ago.   She is on ELIQUIS . Started on neurontin  by PCP on 05/24/23- she stopped this. Was given steroids and had palpitations.   She does not smoke.   Bowel/Bladder Dysfunction: none  Conservative measures:  Physical therapy:  has not participated in PT, did for her right leg a few months ago Multimodal medical therapy including regular antiinflammatories:  celebrex, flexeril , gabapentin , Prednisone Injections:  no epidural steroid injections  Past Surgery: no spinal surgeries  The symptoms are causing a significant impact on the patient's life.   Review of Systems:  A 10 point review of systems is negative, except for the pertinent positives and negatives detailed in the HPI.  Past Medical History: Past Medical  History:  Diagnosis Date   Arthritis    Asthma    Asthma    Chest pain    a. 11/2019 Cor CTA: Calcium  score equals 0.  Normal coronary arteries.   Diastolic dysfunction    a. 11/2019 Echo: EF 60-65%.  Grade 1 diastolic dysfunction; b. 12/2021 Echo: EF 60-65%, GrI DD; c. 05/2022 Echo: EF 60-65%, no rwma. Nl RV fxn. RVSP 43.59mmHg. Sev dil LA. Mod MR.   GERD (gastroesophageal reflux disease)    History of cervical cancer    Hyperlipidemia    Hypertension    Mitral regurgitation    a. 12/22023 Echo: Mild-mod MR; b. 05/2022 Echo: Mod MR.   PAH (pulmonary artery hypertension) (HCC)    a. 11/2019 Echo: RVSP ; b .12/2021 Echo: RVSP 39.47mmHg; c. 05/2022 Echo: RVSP 43.36mmHg.   Persistent atrial fibrillation (HCC)    a. Dx 04/2022-->Eliquis  (CHA2DS2VASc = 3); b. 06/2022 Zio: predominantly sinus rhythm at a rate of 66 bpm (range 44-154), 2 brief runs of SVT (fastest 154 bpm x 20 beats) and otherwise rare PACs and PVCs.   Sleep-disordered breathing     Past Surgical History: Past Surgical History:  Procedure Laterality Date   ABDOMINAL HYSTERECTOMY  1999   total   HAND SURGERY Bilateral 2003   carpal tunnel   LIPOMA EXCISION      Allergies: Allergies as of 06/28/2023 - Review Complete 06/28/2023  Allergen Reaction Noted   Methylprednisolone Palpitations 12/29/2022    Medications: Outpatient Encounter Medications as of 06/28/2023  Medication Sig   albuterol  (VENTOLIN  HFA) 108 (90  Base) MCG/ACT inhaler Inhale 2 puffs into the lungs every 6 (six) hours as needed for wheezing or shortness of breath.   apixaban  (ELIQUIS ) 5 MG TABS tablet Take 1 tablet (5 mg total) by mouth 2 (two) times daily.   celecoxib (CELEBREX) 200 MG capsule Take 200 mg by mouth daily. PRN   clonazePAM  (KLONOPIN ) 0.5 MG tablet Take 0.5-1 tablets (0.25-0.5 mg total) by mouth 2 (two) times daily as needed for anxiety.   cyclobenzaprine  (FLEXERIL ) 5 MG tablet Take 1 tablet (5 mg total) by mouth at bedtime.   fluticasone   furoate-vilanterol (BREO ELLIPTA ) 200-25 MCG/ACT AEPB Inhale 1 puff into the lungs daily.   gabapentin  (NEURONTIN ) 100 MG capsule Take 1 capsule (100 mg total) by mouth at bedtime.   lisinopril  (ZESTRIL ) 10 MG tablet Take 1 tablet (10 mg total) by mouth daily.   loratadine (CLARITIN) 10 MG tablet Take 10 mg by mouth daily.   metoprolol  tartrate (LOPRESSOR ) 25 MG tablet Take 0.5 tablets (12.5 mg total) by mouth 2 (two) times daily.   rosuvastatin  (CRESTOR ) 5 MG tablet TAKE 1 TABLET (5 MG TOTAL) BY MOUTH DAILY. STOP ATORVASTATIN    sertraline  (ZOLOFT ) 50 MG tablet Take 1 tablet (50 mg total) by mouth daily.   No facility-administered encounter medications on file as of 06/28/2023.    Social History: Social History   Tobacco Use   Smoking status: Former    Current packs/day: 0.00    Average packs/day: 0.5 packs/day for 6.0 years (3.0 ttl pk-yrs)    Types: Cigarettes    Start date: 01/25/1991    Quit date: 01/24/1997    Years since quitting: 26.4   Smokeless tobacco: Never  Vaping Use   Vaping status: Never Used  Substance Use Topics   Alcohol use: No   Drug use: No    Family Medical History: Family History  Problem Relation Age of Onset   Heart disease Mother    Stroke Mother    Congestive Heart Failure Mother    Hypertension Mother    Hypertension Father    Kidney disease Father    Heart murmur Sister    Hypertension Sister    Ovarian cancer Sister        ovarian   Hypertension Sister    Hypertension Sister    Lung cancer Brother    Hypertension Brother    Hypertension Brother    Sleep apnea Brother    Hypertension Brother    Heart disease Brother    Hypertension Brother    Hypertension Brother    Hypertension Brother    Hypertension Brother    Hypertension Brother    Mental illness Maternal Aunt    Colon cancer Neg Hx    Breast cancer Neg Hx     Physical Examination: Vitals:   06/28/23 1356  BP: (!) 152/96    General: Patient is well developed, well  nourished, calm, collected, and in no apparent distress. Attention to examination is appropriate.  Respiratory: Patient is breathing without any difficulty.  NEUROLOGICAL:     Awake, alert, oriented to person, place, and time.  Speech is clear and fluent. Fund of knowledge is appropriate.   Cranial Nerves: Pupils equal round and reactive to light.  Facial tone is symmetric.    No posterior cervical tenderness.   No posterior lumbar tenderness.   No abnormal lesions on exposed skin.   Strength: Side Biceps Triceps Deltoid Interossei Grip Wrist Ext. Wrist Flex.  R 5 5 5 5 4 5  5  L 5 4 5 5 4 4 5    Side Iliopsoas Quads Hamstring PF DF EHL  R 5 5 5 5 5 5   L 5 5 5 5 5 5    She has intermittent involuntary jerking movements of left leg.   Reflexes are 2+ and symmetric at the biceps, brachioradialis and 3+ patella and achilles.   Hoffman's is present.   She has clonus is bilateral lower extremities.   Bilateral upper and lower extremity sensation is intact to light touch, but subjectively diminished from forearms down into hands and from knees into feet.   Gait is slow and unsteady.   Medical Decision Making  Imaging: Cervical xray dated 05/24/23:  FINDINGS: The cervical spine is visualized from C1-C6. There is straightening of the cervical lordosis. No high-grade spondylolisthesis. No definitive acute fracture. Severe intervertebral disc space height loss at C5-6 and C6-7. Moderate intervertebral disc space height loss at C4-5. Severe multilevel uncovertebral hypertrophy and facet arthropathy results in osseous neuroforaminal narrowing bilaterally. This is severe at RIGHT C4-5, bilateral C5-6 and LEFT C3-4. It is moderate at bilateral C6-7, LEFT C2-3, RIGHT C3-4, LEFT C4-5. No prevertebral soft tissue swelling. Visualized thorax is unremarkable.   IMPRESSION: Severe multilevel degenerative changes of the cervical spine with osseous neuroforaminal narrowing bilaterally.      Electronically Signed   By: Clancy Crimes M.D.   On: 06/05/2023 11:51  I have personally reviewed the images and agree with the above interpretation. I think she has slight slip C4-C5.   Assessment and Plan: Ms. Lavergne has noted a gradual decline in her mobility over last 4 months. She was very active and independent prior to this.   She has intermittent neck pain with no arm pain- this pain is tolerable. She has constant numbness/tingling in her hands x 4 weeks. She is dropping things and has weakness in her hands. She has difficulty walking with balance issues- this started when her hands went numb.   She has know cervical spondylosis and DDD C4-C7 with slight slip C4-C5.   She has intermittent LBP that is worse with standing and walking. She has constant pain in right anterior thigh that is worse with standing and walking.  She notes involuntary "jumping" of left leg that is worse at night and prevents her from sleeping x 2 weeks. She has intermittent numbness and tingling in both legs with weakness.   She is hyper reflexic in her lower extremities, she has unsteady gain, positive hoffmans, and clonus in lower extremities.   Symptoms are very suspicious for cervical and/or thoracic myelopathy.   Treatment options discussed with patient and following plan made:   - MRI of cervical and thoracic spine to evaluate for stenosis.  - MRI of lumbar spine to evaluate lumbar radiculopathy.  - Prescription for rollator to help with unsteady gait.  - She will let me know when MRI scans are done so we can call to get them read.  - Left leg involuntary movements not likely spine mediated. Could be from neurontin - if so, should improve. She just stopped taking neurontin  yesterday.  - Will schedule follow up visit to review MRI results once I get them back.   BP was elevated. No symptoms of chest pain, blurry vision, or headaches. She has chronic shortness of breath and it is unchanged. She  will recheck at home and call PCP if not improved. If she develops CP, worsening SOB, blurry vision, or headaches, then she will go to ED.  I spent a total of 45 minutes in face-to-face and non-face-to-face activities related to this patient's care today including review of outside records, review of imaging, review of symptoms, physical exam, discussion of differential diagnosis, discussion of treatment options, and documentation.   Thank you for involving me in the care of this patient.   Lucetta Russel PA-C Dept. of Neurosurgery

## 2023-06-28 ENCOUNTER — Ambulatory Visit (INDEPENDENT_AMBULATORY_CARE_PROVIDER_SITE_OTHER): Admitting: Orthopedic Surgery

## 2023-06-28 ENCOUNTER — Encounter: Payer: Self-pay | Admitting: Orthopedic Surgery

## 2023-06-28 VITALS — BP 164/92 | Ht 67.0 in | Wt 184.0 lb

## 2023-06-28 DIAGNOSIS — M545 Low back pain, unspecified: Secondary | ICD-10-CM

## 2023-06-28 DIAGNOSIS — R258 Other abnormal involuntary movements: Secondary | ICD-10-CM

## 2023-06-28 DIAGNOSIS — M5412 Radiculopathy, cervical region: Secondary | ICD-10-CM

## 2023-06-28 DIAGNOSIS — R2681 Unsteadiness on feet: Secondary | ICD-10-CM | POA: Diagnosis not present

## 2023-06-28 DIAGNOSIS — R202 Paresthesia of skin: Secondary | ICD-10-CM | POA: Diagnosis not present

## 2023-06-28 DIAGNOSIS — R292 Abnormal reflex: Secondary | ICD-10-CM

## 2023-06-28 DIAGNOSIS — R200 Anesthesia of skin: Secondary | ICD-10-CM

## 2023-06-28 DIAGNOSIS — M47812 Spondylosis without myelopathy or radiculopathy, cervical region: Secondary | ICD-10-CM | POA: Diagnosis not present

## 2023-06-28 DIAGNOSIS — M5416 Radiculopathy, lumbar region: Secondary | ICD-10-CM

## 2023-06-28 MED ORDER — ROLLATOR ULTRA-LIGHT MISC: 0 refills | Status: DC

## 2023-06-28 NOTE — Patient Instructions (Signed)
 It was so nice to see you today. Thank you so much for coming in.    I want to get an MRI of your neck, mid back, and lower back to look into things further. We will get this approved through your insurance and [facility] will call you to schedule the appointment. Ask about your patient responsibility. You do not need to pay this prior to getting MRI, they can bill you.   [facility] (building with the white pillars) is located off of [address]. The address is [address] [place], [place] [address].    Please let me know when you have the MRIs done so I can call to get them read quickly.   Once I have the results, we will call you to schedule a follow up visit with me to review them.   I printed a script for a rollator walker. You can try taking to your local pharmacy, but I'm not sure they will go through your insurance. May also try going to [facility]. They are at [address] in [place]. Their number is [phone].   Your blood pressure was elevated today. I want you to recheck it at home and follow up with your PCP if it remains high. If you have any chest pain, increased shortness of breath (from your normal), blurry vision, or headaches then you need to go to ED.    Please do not hesitate to call if you have any questions or concerns. You can also message me in MyChart.   Lucetta Russel PA-C [phone]     The physicians and staff at [facility] at [facility] are committed to providing excellent care. You may receive a survey asking for feedback about your experience at our office. We value you your feedback and appreciate you taking the time to to fill it out. The [facility] leadership team is also available to discuss your experience in person, feel free to contact us  [phone].

## 2023-06-29 ENCOUNTER — Encounter: Payer: Self-pay | Admitting: Emergency Medicine

## 2023-06-29 ENCOUNTER — Emergency Department

## 2023-06-29 ENCOUNTER — Other Ambulatory Visit: Payer: Self-pay

## 2023-06-29 ENCOUNTER — Observation Stay
Admission: EM | Admit: 2023-06-29 | Discharge: 2023-06-30 | Disposition: A | Discharge status: Home / Self Care | Attending: Internal Medicine | Admitting: Internal Medicine

## 2023-06-29 DIAGNOSIS — Z79899 Other long term (current) drug therapy: Secondary | ICD-10-CM | POA: Insufficient documentation

## 2023-06-29 DIAGNOSIS — I130 Hypertensive heart and chronic kidney disease with heart failure and stage 1 through stage 4 chronic kidney disease, or unspecified chronic kidney disease: Secondary | ICD-10-CM | POA: Diagnosis not present

## 2023-06-29 DIAGNOSIS — I214 Non-ST elevation (NSTEMI) myocardial infarction: Secondary | ICD-10-CM | POA: Diagnosis present

## 2023-06-29 DIAGNOSIS — G629 Polyneuropathy, unspecified: Secondary | ICD-10-CM | POA: Diagnosis not present

## 2023-06-29 DIAGNOSIS — I2489 Other forms of acute ischemic heart disease: Secondary | ICD-10-CM

## 2023-06-29 DIAGNOSIS — I5189 Other ill-defined heart diseases: Secondary | ICD-10-CM | POA: Diagnosis not present

## 2023-06-29 DIAGNOSIS — I5030 Unspecified diastolic (congestive) heart failure: Secondary | ICD-10-CM | POA: Insufficient documentation

## 2023-06-29 DIAGNOSIS — I21A1 Myocardial infarction type 2: Principal | ICD-10-CM | POA: Insufficient documentation

## 2023-06-29 DIAGNOSIS — I4891 Unspecified atrial fibrillation: Secondary | ICD-10-CM | POA: Diagnosis not present

## 2023-06-29 DIAGNOSIS — I10 Essential (primary) hypertension: Secondary | ICD-10-CM | POA: Diagnosis not present

## 2023-06-29 DIAGNOSIS — I517 Cardiomegaly: Secondary | ICD-10-CM | POA: Diagnosis not present

## 2023-06-29 DIAGNOSIS — Z7982 Long term (current) use of aspirin: Secondary | ICD-10-CM | POA: Insufficient documentation

## 2023-06-29 DIAGNOSIS — R0602 Shortness of breath: Secondary | ICD-10-CM | POA: Diagnosis not present

## 2023-06-29 DIAGNOSIS — Z7901 Long term (current) use of anticoagulants: Secondary | ICD-10-CM | POA: Insufficient documentation

## 2023-06-29 DIAGNOSIS — N1831 Chronic kidney disease, stage 3a: Secondary | ICD-10-CM | POA: Diagnosis present

## 2023-06-29 DIAGNOSIS — I4819 Other persistent atrial fibrillation: Secondary | ICD-10-CM | POA: Diagnosis not present

## 2023-06-29 DIAGNOSIS — R0989 Other specified symptoms and signs involving the circulatory and respiratory systems: Secondary | ICD-10-CM | POA: Diagnosis not present

## 2023-06-29 LAB — COMPREHENSIVE METABOLIC PANEL WITH GFR
ALT: 16 U/L (ref 0–44)
AST: 17 U/L (ref 15–41)
Albumin: 3.9 g/dL (ref 3.5–5.0)
Alkaline Phosphatase: 48 U/L (ref 38–126)
Anion gap: 7 (ref 5–15)
BUN: 20 mg/dL (ref 8–23)
CO2: 22 mmol/L (ref 22–32)
Calcium: 9.2 mg/dL (ref 8.9–10.3)
Chloride: 108 mmol/L (ref 98–111)
Creatinine, Ser: 1.2 mg/dL — ABNORMAL HIGH (ref 0.44–1.00)
GFR, Estimated: 48 mL/min — ABNORMAL LOW (ref >60)
Glucose, Bld: 121 mg/dL — ABNORMAL HIGH (ref 70–99)
Potassium: 4 mmol/L (ref 3.5–5.1)
Sodium: 137 mmol/L (ref 135–145)
Total Bilirubin: 0.8 mg/dL (ref 0.0–1.2)
Total Protein: 7 g/dL (ref 6.5–8.1)

## 2023-06-29 LAB — CBC WITH DIFFERENTIAL/PLATELET
Abs Immature Granulocytes: 0.02 10*3/uL (ref 0.00–0.07)
Basophils Absolute: 0.1 10*3/uL (ref 0.0–0.1)
Basophils Relative: 1 %
Eosinophils Absolute: 0.2 10*3/uL (ref 0.0–0.5)
Eosinophils Relative: 2 %
HCT: 33.3 % — ABNORMAL LOW (ref 36.0–46.0)
Hemoglobin: 11 g/dL — ABNORMAL LOW (ref 12.0–15.0)
Immature Granulocytes: 0 %
Lymphocytes Relative: 35 %
Lymphs Abs: 2.4 10*3/uL (ref 0.7–4.0)
MCH: 31.7 pg (ref 26.0–34.0)
MCHC: 33 g/dL (ref 30.0–36.0)
MCV: 96 fL (ref 80.0–100.0)
Monocytes Absolute: 0.6 10*3/uL (ref 0.1–1.0)
Monocytes Relative: 9 %
Neutro Abs: 3.6 10*3/uL (ref 1.7–7.7)
Neutrophils Relative %: 53 %
Platelets: 352 10*3/uL (ref 150–400)
RBC: 3.47 MIL/uL — ABNORMAL LOW (ref 3.87–5.11)
RDW: 12.5 % (ref 11.5–15.5)
WBC: 6.8 10*3/uL (ref 4.0–10.5)
nRBC: 0 % (ref 0.0–0.2)

## 2023-06-29 LAB — TROPONIN I (HIGH SENSITIVITY)
Troponin I (High Sensitivity): 29 ng/L — ABNORMAL HIGH (ref <18)
Troponin I (High Sensitivity): 282 ng/L (ref <18)
Troponin I (High Sensitivity): 1004 ng/L (ref <18)
Troponin I (High Sensitivity): 1626 ng/L (ref <18)
Troponin I (High Sensitivity): 1848 ng/L (ref <18)
Troponin I (High Sensitivity): 1798 ng/L

## 2023-06-29 LAB — APTT
aPTT: 33 s (ref 24–36)
aPTT: 114 s — ABNORMAL HIGH (ref 24–36)

## 2023-06-29 LAB — BRAIN NATRIURETIC PEPTIDE: B Natriuretic Peptide: 270.2 pg/mL — ABNORMAL HIGH (ref 0.0–100.0)

## 2023-06-29 LAB — PROTIME-INR
INR: 1.3 — ABNORMAL HIGH (ref 0.8–1.2)
Prothrombin Time: 16 s — ABNORMAL HIGH (ref 11.4–15.2)

## 2023-06-29 LAB — HEPARIN LEVEL (UNFRACTIONATED): Heparin Unfractionated: >1.1 [IU]/mL — ABNORMAL HIGH (ref 0.30–0.70)

## 2023-06-29 MED ORDER — ROSUVASTATIN CALCIUM 5 MG PO TABS
5.0000 mg | ORAL_TABLET | Freq: Every day | ORAL | Status: DC
  Administered 2023-06-29: 5 mg via ORAL
  Filled 2023-06-29 (×2): qty 1

## 2023-06-29 MED ORDER — AMIODARONE HCL 200 MG PO TABS: 200.0000 mg | ORAL_TABLET | Freq: Every day | ORAL | Status: DC

## 2023-06-29 MED ORDER — AMIODARONE HCL IN DEXTROSE 360-4.14 MG/200ML-% IV SOLN: 30.0000 mg/h | INTRAVENOUS | Status: DC

## 2023-06-29 MED ORDER — LACTATED RINGERS IV BOLUS
1000.0000 mL | Freq: Once | INTRAVENOUS | Status: AC
  Administered 2023-06-29: 1000 mL via INTRAVENOUS

## 2023-06-29 MED ORDER — ALBUTEROL SULFATE (2.5 MG/3ML) 0.083% IN NEBU: 3.0000 mL | INHALATION_SOLUTION | Freq: Four times a day (QID) | RESPIRATORY_TRACT | Status: DC | PRN

## 2023-06-29 MED ORDER — METOPROLOL TARTRATE 5 MG/5ML IV SOLN
5.0000 mg | Freq: Once | INTRAVENOUS | Status: AC
  Administered 2023-06-29: 5 mg via INTRAVENOUS
  Filled 2023-06-29: qty 5

## 2023-06-29 MED ORDER — SERTRALINE HCL 50 MG PO TABS
50.0000 mg | ORAL_TABLET | Freq: Every day | ORAL | Status: DC
  Administered 2023-06-29 – 2023-06-30 (×2): 50 mg via ORAL
  Filled 2023-06-29 (×2): qty 1

## 2023-06-29 MED ORDER — METOPROLOL TARTRATE 25 MG PO TABS
12.5000 mg | ORAL_TABLET | Freq: Once | ORAL | Status: AC
  Administered 2023-06-29: 12.5 mg via ORAL
  Filled 2023-06-29: qty 1

## 2023-06-29 MED ORDER — ASPIRIN 81 MG PO TBEC
81.0000 mg | DELAYED_RELEASE_TABLET | Freq: Every day | ORAL | Status: DC
  Administered 2023-06-30: 81 mg via ORAL
  Filled 2023-06-29: qty 1

## 2023-06-29 MED ORDER — ACETAMINOPHEN 325 MG PO TABS: 650.0000 mg | ORAL_TABLET | ORAL | Status: DC | PRN

## 2023-06-29 MED ORDER — CLONAZEPAM 0.5 MG PO TABS: 0.5000 mg | ORAL_TABLET | Freq: Two times a day (BID) | ORAL | Status: DC | PRN

## 2023-06-29 MED ORDER — AMIODARONE HCL IN DEXTROSE 360-4.14 MG/200ML-% IV SOLN
60.0000 mg/h | INTRAVENOUS | Status: DC
  Administered 2023-06-29: 60 mg/h via INTRAVENOUS
  Filled 2023-06-29: qty 200

## 2023-06-29 MED ORDER — FLUTICASONE FUROATE-VILANTEROL 200-25 MCG/ACT IN AEPB
1.0000 | INHALATION_SPRAY | Freq: Every day | RESPIRATORY_TRACT | Status: DC
  Filled 2023-06-29: qty 28

## 2023-06-29 MED ORDER — LORATADINE 10 MG PO TABS
10.0000 mg | ORAL_TABLET | Freq: Every day | ORAL | Status: DC
  Administered 2023-06-29 – 2023-06-30 (×2): 10 mg via ORAL
  Filled 2023-06-29 (×2): qty 1

## 2023-06-29 MED ORDER — NITROGLYCERIN 0.4 MG SL SUBL
0.4000 mg | SUBLINGUAL_TABLET | SUBLINGUAL | Status: DC | PRN
  Administered 2023-06-30 (×2): 0.4 mg via SUBLINGUAL
  Filled 2023-06-29: qty 1

## 2023-06-29 MED ORDER — ASPIRIN 325 MG PO TABS
325.0000 mg | ORAL_TABLET | Freq: Once | ORAL | Status: AC
  Administered 2023-06-29: 325 mg via ORAL
  Filled 2023-06-29: qty 1

## 2023-06-29 MED ORDER — HEPARIN BOLUS VIA INFUSION
4000.0000 [IU] | Freq: Once | INTRAVENOUS | Status: AC
  Administered 2023-06-29: 4000 [IU] via INTRAVENOUS
  Filled 2023-06-29: qty 4000

## 2023-06-29 MED ORDER — HEPARIN (PORCINE) 25000 UT/250ML-% IV SOLN: 1150.0000 [IU]/h | INTRAVENOUS | Status: DC

## 2023-06-29 MED ORDER — AMIODARONE HCL 200 MG PO TABS
400.0000 mg | ORAL_TABLET | Freq: Two times a day (BID) | ORAL | Status: DC
  Administered 2023-06-29 – 2023-06-30 (×3): 400 mg via ORAL
  Filled 2023-06-29 (×3): qty 2

## 2023-06-29 MED ORDER — ASPIRIN 81 MG PO CHEW
81.0000 mg | CHEWABLE_TABLET | ORAL | Status: AC
  Administered 2023-06-30: 81 mg via ORAL
  Filled 2023-06-29: qty 1

## 2023-06-29 MED ORDER — FAMOTIDINE 20 MG PO TABS
20.0000 mg | ORAL_TABLET | Freq: Every day | ORAL | Status: DC
  Administered 2023-06-29 – 2023-06-30 (×2): 20 mg via ORAL
  Filled 2023-06-29 (×2): qty 1

## 2023-06-29 MED ORDER — AMIODARONE HCL 200 MG PO TABS: 200.0000 mg | ORAL_TABLET | Freq: Two times a day (BID) | ORAL | Status: DC

## 2023-06-29 MED ORDER — SODIUM CHLORIDE 0.9 % IV SOLN: INTRAVENOUS | Status: DC

## 2023-06-29 MED ORDER — ONDANSETRON HCL 4 MG/2ML IJ SOLN: 4.0000 mg | Freq: Four times a day (QID) | INTRAMUSCULAR | Status: DC | PRN

## 2023-06-29 MED ORDER — HEPARIN (PORCINE) 25000 UT/250ML-% IV SOLN
1000.0000 [IU]/h | INTRAVENOUS | Status: DC
  Administered 2023-06-29: 1150 [IU]/h via INTRAVENOUS
  Filled 2023-06-29: qty 250

## 2023-06-29 MED ORDER — AMIODARONE LOAD VIA INFUSION
150.0000 mg | Freq: Once | INTRAVENOUS | Status: AC
  Administered 2023-06-29: 150 mg via INTRAVENOUS
  Filled 2023-06-29: qty 83.34

## 2023-06-29 MED ORDER — CYCLOBENZAPRINE HCL 10 MG PO TABS: 5.0000 mg | ORAL_TABLET | Freq: Every day | ORAL | Status: DC

## 2023-06-29 NOTE — Assessment & Plan Note (Signed)
-   Hold home lisinopril  for now

## 2023-06-29 NOTE — Consult Note (Addendum)
 Pharmacy Consult Note - Anticoagulation  Pharmacy Consult for heparin Indication: chest pain/ACS  PATIENT MEASUREMENTS: Height: 5\' 7"  (170.2 cm) Weight: 81.6 kg (180 lb) IBW/kg (Calculated) : 61.6 HEPARIN DW (KG): 78.4  VITAL SIGNS: Temp: 97.7 F (36.5 C) (06/04 1230) Temp Source: Oral (06/04 1230) BP: 130/66 (06/04 1240) Pulse Rate: 48 (06/04 1240)  Recent Labs    06/29/23 0723 06/29/23 0919 06/29/23 1422  HGB 11.0*  --   --   HCT 33.3*  --   --   PLT 352  --   --   APTT  --   --  33  LABPROT  --   --  16.0*  INR  --   --  1.3*  HEPARINUNFRC  --   --  >1.10*  CREATININE 1.20*  --   --   TROPONINIHS 29*   < > 1,626*   < > = values in this interval not displayed.    Estimated Creatinine Clearance: 46.6 mL/min (A) (by C-G formula based on SCr of 1.2 mg/dL (H)).  PAST MEDICAL HISTORY: Past Medical History:  Diagnosis Date   Arthritis    Asthma    Asthma    Chest pain    a. 11/2019 Cor CTA: Calcium  score equals 0.  Normal coronary arteries.   Diastolic dysfunction    a. 11/2019 Echo: EF 60-65%.  Grade 1 diastolic dysfunction; b. 12/2021 Echo: EF 60-65%, GrI DD; c. 05/2022 Echo: EF 60-65%, no rwma. Nl RV fxn. RVSP 43.25mmHg. Sev dil LA. Mod MR.   GERD (gastroesophageal reflux disease)    History of cervical cancer    Hyperlipidemia    Hypertension    Mitral regurgitation    a. 12/22023 Echo: Mild-mod MR; b. 05/2022 Echo: Mod MR.   PAH (pulmonary artery hypertension) (HCC)    a. 11/2019 Echo: RVSP ; b .12/2021 Echo: RVSP 39.70mmHg; c. 05/2022 Echo: RVSP 43.59mmHg.   Persistent atrial fibrillation (HCC)    a. Dx 04/2022-->Eliquis  (CHA2DS2VASc = 3); b. 06/2022 Zio: predominantly sinus rhythm at a rate of 66 bpm (range 44-154), 2 brief runs of SVT (fastest 154 bpm x 20 beats) and otherwise rare PACs and PVCs.   Sleep-disordered breathing     ASSESSMENT: 73 y.o. female with PMH HTN, CKD is presenting with SOB and elevated troponins, concerning for ACS. Patient is on  chronic anticoagulation with apixaban  per chart review. Last dose was yesterday evening at bedtime. Baseline antiXa is >1.10, reflecting false elevation from recent apixaban . Pharmacy has been consulted to initiate and manage heparin intravenous infusion.  Pertinent medications: Apixaban  5mg  twice daily Last dose 6/3 @ 2300  Goal(s) of therapy: Heparin level 0.3 - 0.7 units/mL aPTT 66 - 102 seconds Monitor platelets by anticoagulation protocol: Yes   Baseline anticoagulation labs: Recent Labs    06/29/23 0723 06/29/23 1422  APTT  --  33  INR  --  1.3*  HGB 11.0*  --   PLT 352  --     Date Time aPTT/HL Rate/Comment   PLAN: Bolus heparin 4000 units x 1 and start heparin infusion at 1150 units/hour Check aPTT 8 hours after start of infusion Continue to titrate by aPTT until heparin level and aPTT correlate and/or apixaban  washes out, then titrate by heparin level alone. Check heparin level with next AM labs. Continue to monitor CBC daily while on heparin infusion.   Will M. Alva Jewels, PharmD Clinical Pharmacist 06/29/2023 3:35 PM

## 2023-06-29 NOTE — Consult Note (Signed)
 Cardiology Consultation   Patient ID: [name] MRN: [id number]; DOB: [birth year]  Admit date: 06/29/2023 Date of Consult: 06/29/2023  PCP:  Mazie Speed, MD   [facility] Providers Cardiologist:  Antionette Kirks, MD        Patient Profile: Whitney Stuart is a 73 y.o. female with a hx of atrial fibrillation, moderate mitral regurgitation, hypertension, hyperlipidemia, pulmonary hypertension, palpitations, and secondhand smoke exposure who is being seen 06/29/2023 for the evaluation of chest pain, atrial fibrillation with RVR, and elevated troponin at the request of Dr. Alejo Amsler.  History of Present Illness:   Ms. [name] was previously evaluated for chest pain and dyspnea 10/2019.  Echo at that time showed normal LV function, G1 DD, mild to moderate MR, and mild to moderate pulmonary hypertension.  Coronary CTA showed calcium  score of 0 and normal coronary arteries.  Home sleep study in 06/2020 was normal.  She was seen in clinic 04/2022 with complaints of palpitations and tachycardia associated with dyspnea and was found to be in rapid atrial fibrillation at 140 bpm.  She was started on Eliquis  5 mg twice daily and aspirin was discontinued.  Metoprolol  25 mg twice daily was added to her regimen.  Echo at that time showed EF of 60 to 65% with normal RV function, RVSP of 33.9 mmHg, severely dilated left atrium, and moderate MR.  In office follow-up 05/2022, she was noted to be in sinus rhythm but bradycardic at 44 bpm.  Metoprolol  was subsequently reduced to 12.5 mg twice daily and see, later was ordered.  This showed predominantly sinus rhythm at a rate of 66 bpm, 2 brief runs of SVT, and otherwise rare PACs and PVCs.  She was most recently seen in our clinic 02/10/2023 and reported 1 episode of syncope in 12/2022.  This occurred after taking Flexeril  and standing up quickly in the evening.   Patient's daughter is at the bedside and assists with history.  Today, patient reports that  she was awoken early this morning around 5 AM with symptoms of headache, shortness of breath, diaphoresis, and chest tightness.  She got up to contact her daughter and let her know that she was not feeling well and subsequently started to feel lightheaded.  Upon arrival, her daughter found her to be "pale and clammy."  She denies any recent fever, nausea, vomiting, cough, and diarrhea.  She denies palpitations and feelings of her heart racing.  Yesterday evening when she went to sleep she was feeling fine.  Patient reports a similar episode which happened approximately 5 months ago that she attributed to an anxiety attack.  She reports that she was recently started on gabapentin  2 weeks ago and has been experiencing twitching of her legs bilaterally.  This was prescribed for symptoms of bilateral hand and arm numbness that has been ongoing for several months.    In the ED, heart rate 135 bpm with otherwise normal vital signs.  Labs notable for creatinine 1.20, which appears baseline for patient.  Hgb 11 and HCT 33.3.  Chest x-ray shows mild pulmonary vascular congestion without frank edema.  EKG shows atrial fibrillation with RVR, rate 157 bpm.  Initial troponin 29 climbing to 282, and 1004.  Patient was started on IV heparin, given IV and oral metoprolol , and started on IV amiodarone load via infusion with subsequent conversion to sinus rhythm.  Cardiology was asked to consult for further evaluation of A-fib with RVR, chest pain, and elevated troponin. At time of my  exam, patient's chest pain and shortness of breath have resolved. She reports feeling much better after returning to sinus rhythm.   Past Medical History:  Diagnosis Date   Arthritis    Asthma    Asthma    Chest pain    a. 11/2019 Cor CTA: Calcium  score equals 0.  Normal coronary arteries.   Diastolic dysfunction    a. 11/2019 Echo: EF 60-65%.  Grade 1 diastolic dysfunction; b. 12/2021 Echo: EF 60-65%, GrI DD; c. 05/2022 Echo: EF 60-65%, no  rwma. Nl RV fxn. RVSP 43.55mmHg. Sev dil LA. Mod MR.   GERD (gastroesophageal reflux disease)    History of cervical cancer    Hyperlipidemia    Hypertension    Mitral regurgitation    a. 12/22023 Echo: Mild-mod MR; b. 05/2022 Echo: Mod MR.   PAH (pulmonary artery hypertension) (HCC)    a. 11/2019 Echo: RVSP ; b .12/2021 Echo: RVSP 39.54mmHg; c. 05/2022 Echo: RVSP 43.60mmHg.   Persistent atrial fibrillation (HCC)    a. Dx 04/2022-->Eliquis  (CHA2DS2VASc = 3); b. 06/2022 Zio: predominantly sinus rhythm at a rate of 66 bpm (range 44-154), 2 brief runs of SVT (fastest 154 bpm x 20 beats) and otherwise rare PACs and PVCs.   Sleep-disordered breathing     Past Surgical History:  Procedure Laterality Date   ABDOMINAL HYSTERECTOMY  1999   total   HAND SURGERY Bilateral 2003   carpal tunnel   LIPOMA EXCISION         Scheduled Meds:  amiodarone  400 mg Oral BID   Followed by   Cecily Cohen ON 07/06/2023] amiodarone  200 mg Oral BID   Followed by   Cecily Cohen ON 07/13/2023] amiodarone  200 mg Oral Daily   [START ON 06/30/2023] aspirin EC  81 mg Oral Daily   aspirin  325 mg Oral Once   Continuous Infusions:  heparin     PRN Meds: acetaminophen , nitroGLYCERIN , ondansetron  (ZOFRAN ) IV  Allergies:    Allergies  Allergen Reactions   Methylprednisolone Palpitations    Social History:   Social History   Socioeconomic History   Marital status: Married    Spouse name: Torii Royse   Number of children: 7   Years of education: Not on file   Highest education level: GED or equivalent  Occupational History   Occupation: Retired  Tobacco Use   Smoking status: Former    Current packs/day: 0.00    Average packs/day: 0.5 packs/day for 6.0 years (3.0 ttl pk-yrs)    Types: Cigarettes    Start date: 01/25/1991    Quit date: 01/24/1997    Years since quitting: 26.4   Smokeless tobacco: Never  Vaping Use   Vaping status: Never Used  Substance and Sexual Activity   Alcohol use: No   Drug  use: No   Sexual activity: Yes    Birth control/protection: Surgical  Other Topics Concern   Not on file  Social History Narrative   Not on file   Social Drivers of Health   Financial Resource Strain: Low Risk  (12/28/2022)   Overall Financial Resource Strain (CARDIA)    Difficulty of Paying Living Expenses: Not hard at all  Food Insecurity: No Food Insecurity (12/28/2022)   Hunger Vital Sign    Worried About Running Out of Food in the Last Year: Never true    Ran Out of Food in the Last Year: Never true  Transportation Needs: No Transportation Needs (12/28/2022)   PRAPARE - Transportation  Lack of Transportation (Medical): No    Lack of Transportation (Non-Medical): No  Physical Activity: Insufficiently Active (12/28/2022)   Exercise Vital Sign    Days of Exercise per Week: 3 days    Minutes of Exercise per Session: 30 min  Stress: No Stress Concern Present (12/28/2022)   [employer] of Occupational Health - Occupational Stress Questionnaire    Feeling of Stress : Not at all  Social Connections: Socially Isolated (12/28/2022)   Social Connection and Isolation Panel [NHANES]    Frequency of Communication with Friends and Family: More than three times a week    Frequency of Social Gatherings with Friends and Family: Never    Attends Religious Services: Never    Database administrator or Organizations: No    Attends Banker Meetings: Never    Marital Status: Widowed  Intimate Partner Violence: Not At Risk (12/28/2022)   Humiliation, Afraid, Rape, and Kick questionnaire    Fear of Current or Ex-Partner: No    Emotionally Abused: No    Physically Abused: No    Sexually Abused: No    Family History:    Family History  Problem Relation Age of Onset   Heart disease Mother    Stroke Mother    Congestive Heart Failure Mother    Hypertension Mother    Hypertension Father    Kidney disease Father    Heart murmur Sister    Hypertension Sister    Ovarian cancer  Sister        ovarian   Hypertension Sister    Hypertension Sister    Lung cancer Brother    Hypertension Brother    Hypertension Brother    Sleep apnea Brother    Hypertension Brother    Heart disease Brother    Hypertension Brother    Hypertension Brother    Hypertension Brother    Hypertension Brother    Hypertension Brother    Mental illness Maternal Aunt    Congestive Heart Failure Daughter    Colon cancer Neg Hx    Breast cancer Neg Hx      ROS:  Please see the history of present illness.   Physical Exam/Data: Vitals:   06/29/23 1032 06/29/23 1100 06/29/23 1230 06/29/23 1240  BP: (!) 115/59 110/82  130/66  Pulse: 62   (!) 48  Resp:    13  Temp:   97.7 F (36.5 C)   TempSrc:   Oral   SpO2:    96%  Weight:      Height:       No intake or output data in the 24 hours ending 06/29/23 1515    06/29/2023    7:10 AM 06/28/2023    1:56 PM 05/24/2023    1:14 PM  Last 3 Weights  Weight (lbs) 180 lb 184 lb 181 lb  Weight (kg) 81.647 kg 83.462 kg 82.101 kg     Body mass index is 28.19 kg/m.  General:  Well nourished, well developed, in no acute distress HEENT: normal Neck: no JVD Vascular: No carotid bruits; Distal pulses 2+ bilaterally Cardiac:  normal S1, S2; RRR; no murmur  Lungs:  clear to auscultation bilaterally, no wheezing, rhonchi or rales  Abd: soft, nontender, no hepatomegaly  Ext: no edema Skin: warm and dry  Psych:  Normal affect   EKG:  The EKG was personally reviewed and demonstrates:  atrial fibrillation with RVR, rate 157 bpm with ST/T wave abnormalities likely rate related Telemetry:  Telemetry  was personally reviewed and demonstrates: Atrial fibrillation with rates up to 150s bpm with conversion to sinus rhythm at 0957 on 6/4  Relevant CV Studies:  06/01/2022 Echo complete 1. Left ventricular ejection fraction, by estimation, is 60 to 65%. The  left ventricle has normal function. The left ventricle has no regional  wall motion abnormalities.  There is mild asymmetric left ventricular  hypertrophy of the basal-septal segment.  Left ventricular diastolic parameters are indeterminate. The average left  ventricular global longitudinal strain is -10.2 %.   2. Right ventricular systolic function is normal. The right ventricular  size is normal. There is mildly elevated pulmonary artery systolic  pressure. The estimated right ventricular systolic pressure is 43.9 mmHg.   3. Left atrial size was severely dilated.   4. The mitral valve is normal in structure. Moderate mitral valve  regurgitation. No evidence of mitral stenosis.   5. The aortic valve is tricuspid. Aortic valve regurgitation is not  visualized. No aortic stenosis is present.   6. The inferior vena cava is normal in size with greater than 50%  respiratory variability, suggesting right atrial pressure of 3 mmHg.   Laboratory Data: High Sensitivity Troponin:   Recent Labs  Lab 06/29/23 0723 06/29/23 0919 06/29/23 1213  TROPONINIHS 29* 282* 1,004*     Chemistry Recent Labs  Lab 06/29/23 0723  NA 137  K 4.0  CL 108  CO2 22  GLUCOSE 121*  BUN 20  CREATININE 1.20*  CALCIUM  9.2  GFRNONAA 48*  ANIONGAP 7    Recent Labs  Lab 06/29/23 0723  PROT 7.0  ALBUMIN 3.9  AST 17  ALT 16  ALKPHOS 48  BILITOT 0.8   Lipids No results for input(s): "CHOL", "TRIG", "HDL", "LABVLDL", "LDLCALC", "CHOLHDL" in the last 168 hours.  Hematology Recent Labs  Lab 06/29/23 0723  WBC 6.8  RBC 3.47*  HGB 11.0*  HCT 33.3*  MCV 96.0  MCH 31.7  MCHC 33.0  RDW 12.5  PLT 352   Thyroid  No results for input(s): "TSH", "FREET4" in the last 168 hours.  BNP Recent Labs  Lab 06/29/23 0723  BNP 270.2*    DDimer No results for input(s): "DDIMER" in the last 168 hours.  Radiology/Studies:  [facility] Chest Port 1 View Result Date: 06/29/2023 IMPRESSION: 1. Borderline cardiomegaly. 2. Mild pulmonary vascular congestion without frank edema. Electronically signed by: Audree Leas  MD 06/29/2023 07:38 AM EDT RP Workstation: [id number]   Assessment and Plan: NSTEMI - Patient presenting after being awoken this morning with symptoms of headache, dyspnea, chest pressure, and lightheadedness - Prior coronary CTA 2021 with normal coronary arteries - Troponin 29 > 282 > 1004, continue to trend - Echo ordered - Elevated troponin could be supply/demand mismatch in the setting of A-fib RVR.  Chest pain has resolved with return to sinus rhythm.  However, ASC cannot be ruled out at this time. - Given patient had significant chest pain and troponin up to 1000, recommend proceeding with cardiac catheterization tomorrow - Continue IV heparin - Continue rosuvastatin , may need to increase dose pending cath results  Informed Consent   Shared Decision Making/Informed Consent The risks [stroke (1 in 1000), death (1 in 1000), kidney failure [usually temporary] (1 in 500), bleeding (1 in 200), allergic reaction [possibly serious] (1 in 200)], benefits (diagnostic support and management of coronary artery disease) and alternatives of a cardiac catheterization were discussed in detail with Ms. Elster and she is willing to proceed.     Atrial fibrillation  with RVR - On arrival, patient found to be in A-fib with RVR heart rate up to 150 bpm - Given IV and oral metoprolol  and subsequently started on IV amiodarone load via infusion in the ED with conversion to sinus rhythm this morning around 10 AM - Maintaining sinus rhythm at this time - Recommend oral amiodarone 400 mg twice daily, followed by 200 mg twice daily, followed by 200 mg daily in effort to prevent recurrence of atrial fibrillation - CHA2DS2-VASc Score = 4  - Continue IV heparin with plan to transition to PTA Eliquis  prior to discharge  Hyperlipidemia - Most recent lipid panel 01/2023 with LDL 86 - Continue rosuvastatin , consider increasing dose pending cath results  Risk Assessment/Risk Scores:    TIMI Risk Score for Unstable  Angina or Non-ST Elevation MI:   The patient's TIMI risk score is 3, which indicates a 13% risk of all cause mortality, new or recurrent myocardial infarction or need for urgent revascularization in the next 14 days.  CHA2DS2-VASc Score = 4   This indicates a 4.8% annual risk of stroke. The patient's score is based upon: CHF History: 0 HTN History: 1 Diabetes History: 0 Stroke History: 0 Vascular Disease History: 1 Age Score: 1 Gender Score: 1      For questions or updates, please contact [facility] Please consult [email or web address] for contact info under    Signed, Brodie Cannon, PA-C  06/29/2023 3:15 PM

## 2023-06-29 NOTE — ED Notes (Signed)
Patient up to use bathroom.

## 2023-06-29 NOTE — Assessment & Plan Note (Signed)
 Patient reports several week to months history of bilateral upper extremity numbness that began on the left before being present on the right, in addition to jerking spasms of her left lower extremity.  During this time, she is also experienced difficulty with ambulation.  She has been seeing neurosurgery for this with normal TSH, vitamin B12.  Cervical x-ray with severe C-spine disease.  Neurosurgery has ordered full spinal MRI, however given extensive nature of symptoms, I wonder if she may benefit from MRI of the brain to rule out any central process.

## 2023-06-29 NOTE — Assessment & Plan Note (Signed)
 Patient presenting with shortness of breath, found to be in atrial fibrillation with RVR, subsequently developing chest pain.  Troponins have increased, currently 1600.  Unclear at this time if this is type I versus type II NSTEMI.  Cardiology has evaluated and planning to take for cath tomorrow.  - Cardiology consulted; appreciate their recommendations - Heparin infusion per pharmacy dosing - S/p aspirin 324 mg once.  Follow with daily aspirin 81 mg - Echocardiogram - N.p.o. after midnight

## 2023-06-29 NOTE — H&P (Signed)
 History and Physical    Patient: Whitney Stuart WUJ:[id number] DOB: 01-11-1951 DOA: 06/29/2023 DOS: the patient was seen and examined on 06/29/2023 PCP: Mazie Speed, MD  Patient coming from: Home  Chief Complaint:  Chief Complaint  Patient presents with   Shortness of Breath   HPI: Whitney Stuart is a 73 y.o. female with medical history significant of Paroxysmal atrial fibrillation, mitral regurgitation, hypertension, hyperlipidemia, HFpEF, who presents to the ED due to shortness of breath.  Whitney Stuart states that she has been experiencing shortness of breath and a headache for several weeks now, however her symptoms became noticeably worse earlier today.  In addition, when she awoke today, she was clammy and her daughter noted that she was pale.  After arriving to the ED, she began to experience severe chest pressure.  She denies any recent illnesses, fever, nausea, vomiting, cough.  She notes intermittent left lower extremity tightness, but denies any lower extremity swelling.  ED course: On arrival to the ED, patient was normotensive at 120/72 with heart rate of 116.  She was saturating at 100% on room air.  She was afebrile and 97.8.  Initial workup notable for hemoglobin 11, creatinine 1.20, GFR 48, BNP 270, and troponin of 29.  Serial troponins demonstrated increased up to 1004.  Cardiology was consulted and patient was started on heparin.  Patient started on amiodarone and subsequently converted to sinus rhythm.  Chest x-ray with no active disease.  TRH contacted for admission.   Review of Systems: As mentioned in the history of present illness. All other systems reviewed and are negative.  Past Medical History:  Diagnosis Date   Arthritis    Asthma    Asthma    Chest pain    a. 11/2019 Cor CTA: Calcium  score equals 0.  Normal coronary arteries.   Diastolic dysfunction    a. 11/2019 Echo: EF 60-65%.  Grade 1 diastolic dysfunction; b. 12/2021 Echo: EF 60-65%, GrI DD; c.  05/2022 Echo: EF 60-65%, no rwma. Nl RV fxn. RVSP 43.46mmHg. Sev dil LA. Mod MR.   GERD (gastroesophageal reflux disease)    History of cervical cancer    Hyperlipidemia    Hypertension    Mitral regurgitation    a. 12/22023 Echo: Mild-mod MR; b. 05/2022 Echo: Mod MR.   PAH (pulmonary artery hypertension) (HCC)    a. 11/2019 Echo: RVSP ; b .12/2021 Echo: RVSP 39.21mmHg; c. 05/2022 Echo: RVSP 43.110mmHg.   Persistent atrial fibrillation (HCC)    a. Dx 04/2022-->Eliquis  (CHA2DS2VASc = 3); b. 06/2022 Zio: predominantly sinus rhythm at a rate of 66 bpm (range 44-154), 2 brief runs of SVT (fastest 154 bpm x 20 beats) and otherwise rare PACs and PVCs.   Sleep-disordered breathing    Past Surgical History:  Procedure Laterality Date   ABDOMINAL HYSTERECTOMY  1999   total   HAND SURGERY Bilateral 2003   carpal tunnel   LIPOMA EXCISION     Social History:  reports that she quit smoking about 26 years ago. Her smoking use included cigarettes. She started smoking about 32 years ago. She has a 3 pack-year smoking history. She has never used smokeless tobacco. She reports that she does not drink alcohol and does not use drugs.  Allergies  Allergen Reactions   Methylprednisolone Palpitations    Family History  Problem Relation Age of Onset   Heart disease Mother    Stroke Mother    Congestive Heart Failure Mother    Hypertension Mother  Hypertension Father    Kidney disease Father    Heart murmur Sister    Hypertension Sister    Ovarian cancer Sister        ovarian   Hypertension Sister    Hypertension Sister    Lung cancer Brother    Hypertension Brother    Hypertension Brother    Sleep apnea Brother    Hypertension Brother    Heart disease Brother    Hypertension Brother    Hypertension Brother    Hypertension Brother    Hypertension Brother    Hypertension Brother    Mental illness Maternal Aunt    Congestive Heart Failure Daughter    Colon cancer Neg Hx    Breast cancer  Neg Hx     Prior to Admission medications   Medication Sig Start Date End Date Taking? Authorizing Provider  albuterol  (VENTOLIN  HFA) 108 (90 Base) MCG/ACT inhaler Inhale 2 puffs into the lungs every 6 (six) hours as needed for wheezing or shortness of breath. 06/09/23  Yes Bacigalupo, Stan Eans, MD  apixaban  (ELIQUIS ) 5 MG TABS tablet Take 1 tablet (5 mg total) by mouth 2 (two) times daily. 04/05/23  Yes Bacigalupo, Angela M, MD  celecoxib (CELEBREX) 200 MG capsule Take 200 mg by mouth daily. PRN 05/18/22  Yes [provider]  clonazePAM  (KLONOPIN ) 0.5 MG tablet Take 0.5-1 tablets (0.25-0.5 mg total) by mouth 2 (two) times daily as needed for anxiety. 04/07/23  Yes Bacigalupo, Stan Eans, MD  cyclobenzaprine  (FLEXERIL ) 5 MG tablet Take 1 tablet (5 mg total) by mouth at bedtime. 04/07/23  Yes Bacigalupo, Angela M, MD  fluticasone  furoate-vilanterol (BREO ELLIPTA ) 200-25 MCG/ACT AEPB Inhale 1 puff into the lungs daily. 04/05/23  Yes Bacigalupo, Stan Eans, MD  lisinopril  (ZESTRIL ) 10 MG tablet Take 1 tablet (10 mg total) by mouth daily. 04/07/23  Yes Bacigalupo, Angela M, MD  loratadine (CLARITIN) 10 MG tablet Take 10 mg by mouth daily.   Yes [provider]  metoprolol  tartrate (LOPRESSOR ) 25 MG tablet Take 0.5 tablets (12.5 mg total) by mouth 2 (two) times daily. 03/16/23 03/10/24 Yes Wenona Hamilton, MD  rosuvastatin  (CRESTOR ) 5 MG tablet TAKE 1 TABLET (5 MG TOTAL) BY MOUTH DAILY. STOP ATORVASTATIN  04/05/23  Yes Bacigalupo, Stan Eans, MD  sertraline  (ZOLOFT ) 50 MG tablet Take 1 tablet (50 mg total) by mouth daily. 04/05/23  Yes Bacigalupo, Stan Eans, MD  gabapentin  (NEURONTIN ) 100 MG capsule Take 1 capsule (100 mg total) by mouth at bedtime. Patient not taking: Reported on 06/29/2023 06/13/23   Mazie Speed, MD  Misc. Devices (ROLLATOR ULTRA-LIGHT) MISC Lightweight rollator for gait stability to prevent falls. 06/28/23   Lucetta Russel, PA-C    Physical Exam: Vitals:   06/29/23 1032  06/29/23 1100 06/29/23 1230 06/29/23 1240  BP: (!) 115/59 110/82  130/66  Pulse: 62   (!) 48  Resp:    13  Temp:   97.7 F (36.5 C)   TempSrc:   Oral   SpO2:    96%  Weight:      Height:       Physical Exam Vitals and nursing note reviewed.  Constitutional:      General: She is not in acute distress.    Appearance: She is normal weight. She is not toxic-appearing.  HENT:     Head: Normocephalic and atraumatic.     Mouth/Throat:     Mouth: Mucous membranes are moist.     Pharynx: Oropharynx is clear.  Cardiovascular:     Rate and Rhythm: Normal rate and regular rhythm.  Pulmonary:     Effort: Pulmonary effort is normal. No tachypnea.     Breath sounds: Normal breath sounds.  Abdominal:     General: Bowel sounds are normal.     Palpations: Abdomen is soft.  Musculoskeletal:     Right lower leg: No edema.     Left lower leg: No edema.  Skin:    General: Skin is warm and dry.  Neurological:     Mental Status: She is alert and oriented to person, place, and time.     Motor: No weakness.  Psychiatric:        Mood and Affect: Mood normal.        Behavior: Behavior normal.    Data Reviewed: CBC with WBC of 6.8, hemoglobin of 11.0, platelets of 352 CMP with sodium of 137, potassium 4.0, bicarb 22, BUN 20, creatinine 0.20, AST 17, ALT 16, GFR 48 BNP 270 Troponin 29, uptrending, currently 1626 INR 1.3  Initial EKG personally reviewed.  Irregular tachycardia consistent with A-fib with RVR  DG Chest Port 1 View Result Date: 06/29/2023 EXAM: 1 VIEW XRAY OF THE CHEST 06/29/2023 07:25:18 AM COMPARISON: None available. CLINICAL HISTORY: Shortness of breath Pt also reports her bilateral hands and arms have been numb for several weeks. FINDINGS: LUNGS AND PLEURA: No focal pulmonary opacity. Mild pulmonary vascular congestion is present without frank edema. No pleural effusion. No pneumothorax. HEART AND MEDIASTINUM: Heart size is at the upper limits of normal. No acute abnormality of  the mediastinal silhouette. BONES AND SOFT TISSUES: No acute osseous abnormality. IMPRESSION: 1. Borderline cardiomegaly. 2. Mild pulmonary vascular congestion without frank edema. Electronically signed by: Audree Leas MD 06/29/2023 07:38 AM EDT RP Workstation: [id number]   Results are pending, will review when available.  Assessment and Plan:  * NSTEMI (non-ST elevated myocardial infarction) [facility]) Patient presenting with shortness of breath, found to be in atrial fibrillation with RVR, subsequently developing chest pain.  Troponins have increased, currently 1600.  Unclear at this time if this is type I versus type II NSTEMI.  Cardiology has evaluated and planning to take for cath tomorrow.  - Cardiology consulted; appreciate their recommendations - Heparin infusion per pharmacy dosing - S/p aspirin 324 mg once.  Follow with daily aspirin 81 mg - Echocardiogram - N.p.o. after midnight  Atrial fibrillation with RVR (HCC) Patient presenting with A-fib and RVR, with conversion to sinus rhythm after initiating amiodarone.  She has maintained sinus rhythm at this time.  - Hold home Eliquis  in light of heparin infusion - Continue amiodarone per cardiology recommendations - Holding metoprolol  at this time  Polyneuropathy Patient reports several week to months history of bilateral upper extremity numbness that began on the left before being present on the right, in addition to jerking spasms of her left lower extremity.  During this time, she is also experienced difficulty with ambulation.  She has been seeing neurosurgery for this with normal TSH, vitamin B12.  Cervical x-ray with severe C-spine disease.  Neurosurgery has ordered full spinal MRI, however given extensive nature of symptoms, I wonder if she may benefit from MRI of the brain to rule out any central process.  Diastolic dysfunction Per chart review, patient has a history of diastolic dysfunction. Last echocardiogram in May 2024  demonstrating LVEF of 60-65%.  She appears euvolemic at this time.  Chronic kidney disease, stage 3a (HCC) History of CKD stage IIIa with creatinine  ranging between 1.1 and 1.4, currently at baseline.  Essential (primary) hypertension - Hold home lisinopril  for now  Advance Care Planning:   Code Status: Full Code   Consults: Cardiology  Family Communication: Patient's daughters updated at bedside  Severity of Illness: The appropriate patient status for this patient is INPATIENT. Inpatient status is judged to be reasonable and necessary in order to provide the required intensity of service to ensure the patient's safety. The patient's presenting symptoms, physical exam findings, and initial radiographic and laboratory data in the context of their chronic comorbidities is felt to place them at high risk for further clinical deterioration. Furthermore, it is not anticipated that the patient will be medically stable for discharge from the hospital within 2 midnights of admission.   * I certify that at the point of admission it is my clinical judgment that the patient will require inpatient hospital care spanning beyond 2 midnights from the point of admission due to high intensity of service, high risk for further deterioration and high frequency of surveillance required.*  Author: Avi Body, MD 06/29/2023 4:09 PM  For on call review [email or web address].

## 2023-06-29 NOTE — Assessment & Plan Note (Signed)
 History of CKD stage IIIa with creatinine ranging between 1.1 and 1.4, currently at baseline.

## 2023-06-29 NOTE — Assessment & Plan Note (Signed)
 Patient presenting with A-fib and RVR, with conversion to sinus rhythm after initiating amiodarone.  She has maintained sinus rhythm at this time.  - Hold home Eliquis  in light of heparin infusion - Continue amiodarone per cardiology recommendations - Holding metoprolol  at this time

## 2023-06-29 NOTE — ED Triage Notes (Signed)
 Pt via POV from home. Pt c/o generalized weakness for the past couple of weeks, reports SOB and headache that started this AM. Pt also reports her bilateral hands and arms have been numb for several weeks. Denies pain. Denies hx of COPD/CHF. Denies sick contacts. Pt is A&Ox4 and NAD

## 2023-06-29 NOTE — Assessment & Plan Note (Signed)
 Per chart review, patient has a history of diastolic dysfunction. Last echocardiogram in May 2024 demonstrating LVEF of 60-65%.  She appears euvolemic at this time.

## 2023-06-29 NOTE — ED Notes (Signed)
EDP in room  

## 2023-06-29 NOTE — ED Notes (Signed)
 Troponin 282.

## 2023-06-29 NOTE — ED Provider Notes (Signed)
 [facility] Provider Note   Event Date/Time   First MD Initiated Contact with Patient 06/29/23 [phone]     (approximate) History  Shortness of Breath  HPI Whitney Stuart is a 73 y.o. female with a past medical history of paroxysmal atrial fibrillation on Eliquis , hypertension, COPD, CHF and hyperlipidemia who presents complaining of worsening fatigue, dyspnea on exertion, and headache that has been worsening over the past few weeks.  Patient also states that she has had bilateral hand and arm numbness for the last few weeks that has been intermittent.  Patient denies any exacerbating or relieving factors for the symptoms.  Patient reports her shortness of breath and headache have worsened started this morning. ROS: Patient currently denies any vision changes, tinnitus, difficulty speaking, facial droop, sore throat, chest pain, shortness of breath, abdominal pain, nausea/vomiting/diarrhea, dysuria, or weakness/numbness/paresthesias in any extremity   Physical Exam  Triage Vital Signs: ED Triage Vitals  Encounter Vitals Group     BP 06/29/23 0711 106/68     Systolic BP Percentile --      Diastolic BP Percentile --      Pulse Rate 06/29/23 0711 (!) 49     Resp 06/29/23 0711 16     Temp --      Temp src --      SpO2 06/29/23 0711 100 %     Weight 06/29/23 0710 180 lb (81.6 kg)     Height 06/29/23 0710 5\' 7"  (1.702 m)     Head Circumference --      Peak Flow --      Pain Score 06/29/23 0709 0     Pain Loc --      Pain Education --      Exclude from Growth Chart --    Most recent vital signs: Vitals:   06/29/23 1230 06/29/23 1240  BP:  130/66  Pulse:  (!) 48  Resp:  13  Temp: 97.7 F (36.5 C)   SpO2:  96%   General: Awake, oriented x4. CV:  Good peripheral perfusion. Resp:  Normal effort. Abd:  No distention. Other:  Elderly overweight Caucasian female resting comfortably in no acute distress ED Results / Procedures / Treatments  Labs (all labs  ordered are listed, but only abnormal results are displayed) Labs Reviewed  COMPREHENSIVE METABOLIC PANEL WITH GFR - Abnormal; Notable for the following components:      Result Value   Glucose, Bld 121 (*)    Creatinine, Ser 1.20 (*)    GFR, Estimated 48 (*)    All other components within normal limits  BRAIN NATRIURETIC PEPTIDE - Abnormal; Notable for the following components:   B Natriuretic Peptide 270.2 (*)    All other components within normal limits  CBC WITH DIFFERENTIAL/PLATELET - Abnormal; Notable for the following components:   RBC 3.47 (*)    Hemoglobin 11.0 (*)    HCT 33.3 (*)    All other components within normal limits  HEPARIN LEVEL (UNFRACTIONATED) - Abnormal; Notable for the following components:   Heparin Unfractionated >1.10 (*)    All other components within normal limits  PROTIME-INR - Abnormal; Notable for the following components:   Prothrombin Time 16.0 (*)    INR 1.3 (*)    All other components within normal limits  TROPONIN I (HIGH SENSITIVITY) - Abnormal; Notable for the following components:   Troponin I (High Sensitivity) 29 (*)    All other components within normal limits  TROPONIN I (HIGH  SENSITIVITY) - Abnormal; Notable for the following components:   Troponin I (High Sensitivity) 282 (*)    All other components within normal limits  TROPONIN I (HIGH SENSITIVITY) - Abnormal; Notable for the following components:   Troponin I (High Sensitivity) 1,004 (*)    All other components within normal limits  APTT  TROPONIN I (HIGH SENSITIVITY)   EKG ED ECG REPORT I, Charleen Conn, the attending physician, personally viewed and interpreted this ECG. Date: 06/29/2023 EKG Time: 0715 Rate: 157 Rhythm: Atrial fibrillation with rapid ventricular response QRS Axis: normal Intervals: normal ST/T Wave abnormalities: normal Narrative Interpretation: Atrial fibrillation with rapid ventricular response.  No evidence of acute ischemia RADIOLOGY ED MD  interpretation: Single view portable chest x-ray shows mild pulmonary vascular congestion with borderline cardiomegaly - All radiology independently interpreted and agree with radiology assessment Official radiology report(s): DG Chest Port 1 View Result Date: 06/29/2023 EXAM: 1 VIEW XRAY OF THE CHEST 06/29/2023 07:25:18 AM COMPARISON: None available. CLINICAL HISTORY: Shortness of breath Pt also reports her bilateral hands and arms have been numb for several weeks. FINDINGS: LUNGS AND PLEURA: No focal pulmonary opacity. Mild pulmonary vascular congestion is present without frank edema. No pleural effusion. No pneumothorax. HEART AND MEDIASTINUM: Heart size is at the upper limits of normal. No acute abnormality of the mediastinal silhouette. BONES AND SOFT TISSUES: No acute osseous abnormality. IMPRESSION: 1. Borderline cardiomegaly. 2. Mild pulmonary vascular congestion without frank edema. Electronically signed by: Audree Leas MD 06/29/2023 07:38 AM EDT RP Workstation: [id number]   PROCEDURES: Critical Care performed: Yes, see critical care procedure note(s) .1-3 Lead EKG Interpretation  Performed by: Charleen Conn, MD Authorized by: Charleen Conn, MD     Interpretation: abnormal     ECG rate:  51   ECG rate assessment: bradycardic     Rhythm: sinus bradycardia     Ectopy: none     Conduction: normal   CRITICAL CARE Performed by: Tiye Huwe K Hilaria Titsworth  Total critical care time: 31 minutes  Critical care time was exclusive of separately billable procedures and treating other patients.  Critical care was necessary to treat or prevent imminent or life-threatening deterioration.  Critical care was time spent personally by me on the following activities: development of treatment plan with patient and/or surrogate as well as nursing, discussions with consultants, evaluation of patient's response to treatment, examination of patient, obtaining history from patient or surrogate, ordering and  performing treatments and interventions, ordering and review of laboratory studies, ordering and review of radiographic studies, pulse oximetry and re-evaluation of patient's condition.  MEDICATIONS ORDERED IN ED: Medications  heparin ADULT infusion 100 units/mL (25000 units/250mL) (has no administration in time range)  amiodarone (PACERONE) tablet 400 mg (400 mg Oral Given 06/29/23 1427)    Followed by  amiodarone (PACERONE) tablet 200 mg (has no administration in time range)    Followed by  amiodarone (PACERONE) tablet 200 mg (has no administration in time range)  aspirin EC tablet 81 mg (has no administration in time range)  nitroGLYCERIN  (NITROSTAT ) SL tablet 0.4 mg (has no administration in time range)  acetaminophen  (TYLENOL ) tablet 650 mg (has no administration in time range)  ondansetron  (ZOFRAN ) injection 4 mg (has no administration in time range)  aspirin tablet 325 mg (has no administration in time range)  lactated ringers bolus 1,000 mL (0 mLs Intravenous Stopped 06/29/23 0830)  metoprolol  tartrate (LOPRESSOR ) injection 5 mg (5 mg Intravenous Given 06/29/23 0743)  amiodarone (NEXTERONE) 1.8 mg/mL load via  infusion 150 mg (150 mg Intravenous Bolus from Bag 06/29/23 0853)  metoprolol  tartrate (LOPRESSOR ) tablet 12.5 mg (12.5 mg Oral Given 06/29/23 1032)   IMPRESSION / MDM / ASSESSMENT AND PLAN / ED COURSE  I reviewed the triage vital signs and the nursing notes.                             The patient is on the cardiac monitor to evaluate for evidence of arrhythmia and/or significant heart rate changes. Patient's presentation is most consistent with acute presentation with potential threat to life or bodily function. 73 year old female who initially presents with A-fib RVR and converted after amiodarone administration Workup: ECG, CXR, CBC, CMP, Troponin Findings: ECG: No overt evidence of STEMI. No evidence of Brugada's sign, delta wave, epsilon wave, significantly prolonged QTc, or  malignant arrhythmia Troponin: 29-->282-->1004 Other Labs unremarkable for emergent problems. CXR: Without PTX, PNA, or widened mediastinum HEART Score: 5  Given History, Exam, and Workup concern for NSTEMI.  I have low suspicion for Pneumothorax, Pneumonia, Pulmonary Embolus, Tamponade, Aortic Dissection  Interventions: ASA 324or325mg  Heparin Bolus 60-70u/kg (max 5000) Heparin gtt about 12-15u/kg/hr (max 1000/hr) PRN analgesia with fentanyl, morphine PRN antiemetic therapy  Dispo: Admit   FINAL CLINICAL IMPRESSION(S) / ED DIAGNOSES   Final diagnoses:  Atrial fibrillation with rapid ventricular response (HCC)  NSTEMI (non-ST elevated myocardial infarction) (HCC)   Rx / DC Orders   ED Discharge Orders     None      Note:  This document was prepared using Dragon voice recognition software and may include unintentional dictation errors.   Ama Mcmaster K, MD 06/29/23 [phone]

## 2023-06-30 ENCOUNTER — Other Ambulatory Visit: Payer: Self-pay

## 2023-06-30 ENCOUNTER — Other Ambulatory Visit: Payer: Self-pay | Admitting: *Deleted

## 2023-06-30 ENCOUNTER — Encounter
Admission: EM | Disposition: A | Discharge status: Home / Self Care | Source: Home / Self Care | Attending: Emergency Medicine

## 2023-06-30 ENCOUNTER — Encounter: Payer: Self-pay | Admitting: Cardiovascular Disease

## 2023-06-30 DIAGNOSIS — G629 Polyneuropathy, unspecified: Secondary | ICD-10-CM | POA: Diagnosis not present

## 2023-06-30 DIAGNOSIS — I4891 Unspecified atrial fibrillation: Secondary | ICD-10-CM | POA: Diagnosis not present

## 2023-06-30 DIAGNOSIS — N1831 Chronic kidney disease, stage 3a: Secondary | ICD-10-CM | POA: Diagnosis not present

## 2023-06-30 DIAGNOSIS — I2489 Other forms of acute ischemic heart disease: Secondary | ICD-10-CM

## 2023-06-30 DIAGNOSIS — I21A1 Myocardial infarction type 2: Secondary | ICD-10-CM | POA: Diagnosis not present

## 2023-06-30 DIAGNOSIS — I214 Non-ST elevation (NSTEMI) myocardial infarction: Secondary | ICD-10-CM | POA: Diagnosis not present

## 2023-06-30 HISTORY — PX: LEFT HEART CATH AND CORONARY ANGIOGRAPHY: CATH118249

## 2023-06-30 LAB — CBC
HCT: 33.1 % — ABNORMAL LOW (ref 36.0–46.0)
Hemoglobin: 10.5 g/dL — ABNORMAL LOW (ref 12.0–15.0)
MCH: 31.8 pg (ref 26.0–34.0)
MCHC: 31.7 g/dL (ref 30.0–36.0)
MCV: 100.3 fL — ABNORMAL HIGH (ref 80.0–100.0)
Platelets: 253 10*3/uL (ref 150–400)
RBC: 3.3 MIL/uL — ABNORMAL LOW (ref 3.87–5.11)
RDW: 12.9 % (ref 11.5–15.5)
WBC: 8.7 10*3/uL (ref 4.0–10.5)
nRBC: 0 % (ref 0.0–0.2)

## 2023-06-30 LAB — BASIC METABOLIC PANEL WITH GFR
Anion gap: 8 (ref 5–15)
BUN: 18 mg/dL (ref 8–23)
CO2: 18 mmol/L — ABNORMAL LOW (ref 22–32)
Calcium: 8.6 mg/dL — ABNORMAL LOW (ref 8.9–10.3)
Chloride: 114 mmol/L — ABNORMAL HIGH (ref 98–111)
Creatinine, Ser: 1.12 mg/dL — ABNORMAL HIGH (ref 0.44–1.00)
GFR, Estimated: 52 mL/min — ABNORMAL LOW (ref >60)
Glucose, Bld: 90 mg/dL (ref 70–99)
Potassium: 4.1 mmol/L (ref 3.5–5.1)
Sodium: 140 mmol/L (ref 135–145)

## 2023-06-30 SURGERY — LEFT HEART CATH AND CORONARY ANGIOGRAPHY
Anesthesia: Moderate Sedation

## 2023-06-30 MED ORDER — VERAPAMIL HCL 2.5 MG/ML IV SOLN
INTRAVENOUS | Status: DC | PRN
  Administered 2023-06-30: 2.5 mg via INTRAVENOUS

## 2023-06-30 MED ORDER — AMIODARONE HCL 200 MG PO TABS
ORAL_TABLET | ORAL | 0 refills | Status: DC
Start: 2023-06-30 — End: 2023-07-23
  Filled 2023-06-30: qty 72, 44d supply, fill #0

## 2023-06-30 MED ORDER — HEPARIN SODIUM (PORCINE) 1000 UNIT/ML IJ SOLN
INTRAMUSCULAR | Status: DC | PRN
Start: 2023-06-30 — End: 2023-06-30
  Administered 2023-06-30: 4000 [IU] via INTRAVENOUS

## 2023-06-30 MED ORDER — VERAPAMIL HCL 2.5 MG/ML IV SOLN
INTRAVENOUS | Status: AC
  Filled 2023-06-30: qty 2

## 2023-06-30 MED ORDER — SODIUM CHLORIDE 0.9 % IV SOLN: INTRAVENOUS | Status: AC

## 2023-06-30 MED ORDER — APIXABAN 5 MG PO TABS: 5.0000 mg | ORAL_TABLET | Freq: Two times a day (BID) | ORAL | Status: AC

## 2023-06-30 MED ORDER — SODIUM CHLORIDE 0.9% FLUSH: 3.0000 mL | Freq: Two times a day (BID) | INTRAVENOUS | Status: DC

## 2023-06-30 MED ORDER — LIDOCAINE HCL 1 % IJ SOLN
INTRAMUSCULAR | Status: AC
  Filled 2023-06-30: qty 20

## 2023-06-30 MED ORDER — HEPARIN SODIUM (PORCINE) 1000 UNIT/ML IJ SOLN
INTRAMUSCULAR | Status: AC
  Filled 2023-06-30: qty 10

## 2023-06-30 MED ORDER — SODIUM CHLORIDE 0.9% FLUSH: 3.0000 mL | INTRAVENOUS | Status: DC | PRN

## 2023-06-30 MED ORDER — MIDAZOLAM HCL 2 MG/2ML IJ SOLN
INTRAMUSCULAR | Status: AC
  Filled 2023-06-30: qty 2

## 2023-06-30 MED ORDER — LIDOCAINE HCL (PF) 1 % IJ SOLN
INTRAMUSCULAR | Status: DC | PRN
Start: 2023-06-30 — End: 2023-06-30
  Administered 2023-06-30: 2 mL

## 2023-06-30 MED ORDER — MIDAZOLAM HCL 2 MG/2ML IJ SOLN
INTRAMUSCULAR | Status: DC | PRN
  Administered 2023-06-30: 1 mg via INTRAVENOUS

## 2023-06-30 MED ORDER — SODIUM CHLORIDE 0.9 % IV SOLN
250.0000 mL | INTRAVENOUS | Status: DC | PRN
Start: 2023-06-30 — End: 2023-06-30

## 2023-06-30 MED ORDER — FENTANYL CITRATE (PF) 100 MCG/2ML IJ SOLN
INTRAMUSCULAR | Status: DC | PRN
  Administered 2023-06-30: 25 ug via INTRAVENOUS

## 2023-06-30 MED ORDER — HEPARIN (PORCINE) IN NACL 1000-0.9 UT/500ML-% IV SOLN
INTRAVENOUS | Status: AC
  Filled 2023-06-30: qty 1000

## 2023-06-30 MED ORDER — NITROGLYCERIN 0.4 MG SL SUBL
0.4000 mg | SUBLINGUAL_TABLET | SUBLINGUAL | 3 refills | Status: DC | PRN
  Filled 2023-06-30: qty 25, 1d supply, fill #0

## 2023-06-30 MED ORDER — FENTANYL CITRATE (PF) 100 MCG/2ML IJ SOLN
INTRAMUSCULAR | Status: AC
  Filled 2023-06-30: qty 2

## 2023-06-30 MED ORDER — HEPARIN (PORCINE) IN NACL 2000-0.9 UNIT/L-% IV SOLN
INTRAVENOUS | Status: DC | PRN
Start: 2023-06-30 — End: 2023-06-30
  Administered 2023-06-30: 1000 mL

## 2023-06-30 MED ORDER — IOHEXOL 300 MG/ML  SOLN
INTRAMUSCULAR | Status: DC | PRN
Start: 2023-06-30 — End: 2023-06-30
  Administered 2023-06-30: 70 mL

## 2023-06-30 SURGICAL SUPPLY — 10 items
CATH INFINITI AMBI 5FR JK (CATHETERS) IMPLANT
CATH INFINITI JR4 5F (CATHETERS) IMPLANT
DEVICE RAD TR BAND REGULAR (VASCULAR PRODUCTS) IMPLANT
DRAPE BRACHIAL (DRAPES) IMPLANT
GLIDESHEATH SLEND SS 6F .021 (SHEATH) IMPLANT
GUIDEWIRE INQWIRE 1.5J.035X260 (WIRE) IMPLANT
PACK CARDIAC CATH (CUSTOM PROCEDURE TRAY) ×1 IMPLANT
SET ATX-X65L (MISCELLANEOUS) IMPLANT
STATION PROTECTION PRESSURIZED (MISCELLANEOUS) IMPLANT
WIRE HITORQ VERSACORE ST 145CM (WIRE) IMPLANT

## 2023-06-30 NOTE — Progress Notes (Signed)
   06/30/23 1045  Spiritual Encounters  Type of Visit Initial  Care provided to: Pt and family  Conversation partners present during encounter Nurse  Reason for visit Advance directives  OnCall Visit Yes   Chaplain responded to the [facility] entered in the EPIC system. Patient and family wanted an AD completed.  Chaplain explained the AD to the patient and family.  Chaplain assessed the patient and had a confidence that the patient understood the document.  Chaplain coordinated the notary (Patient Advocate, LuAnn) and witnesses.  Chaplain scanned the original document to [email or web address] and made 2 copies for the family and gave the original to the patient.  Rev. Rana M. Nolon Baxter, M.Div. Chaplain Resident  [facility]

## 2023-06-30 NOTE — Care Management CC44 (Signed)
 Condition Code 44 Documentation Completed  Patient Details  Name: Whitney Stuart MRN: [id number] Date of Birth: October 30, 1950   Condition Code 44 given:  Yes Patient signature on Condition Code 44 notice:  Yes Documentation of 2 MD's agreement:  Yes Code 44 added to claim:  Yes    Odilia Bennett, LCSW 06/30/2023, 1:19 PM

## 2023-06-30 NOTE — Consult Note (Signed)
 Pharmacy Consult Note - Anticoagulation  Pharmacy Consult for heparin Indication: chest pain/ACS  PATIENT MEASUREMENTS: Height: 5\' 7"  (170.2 cm) Weight: 83.3 kg (183 lb 11.2 oz) IBW/kg (Calculated) : 61.6 HEPARIN DW (KG): 78.4  VITAL SIGNS: Temp: 98.3 F (36.8 C) (06/05 0000) Temp Source: Oral (06/05 0000) BP: 117/84 (06/05 0000) Pulse Rate: 70 (06/05 0000)  Recent Labs    06/29/23 0723 06/29/23 0919 06/29/23 1422 06/29/23 1847 06/29/23 2254  HGB 11.0*  --   --   --   --   HCT 33.3*  --   --   --   --   PLT 352  --   --   --   --   APTT  --    < > 33  --  114*  LABPROT  --   --  16.0*  --   --   INR  --   --  1.3*  --   --   HEPARINUNFRC  --   --  >1.10*  --   --   CREATININE 1.20*  --   --   --   --   TROPONINIHS 29*   < > 1,626*   < > 1,798*   < > = values in this interval not displayed.    Estimated Creatinine Clearance: 47 mL/min (A) (by C-G formula based on SCr of 1.2 mg/dL (H)).  PAST MEDICAL HISTORY: Past Medical History:  Diagnosis Date   Arthritis    Asthma    Asthma    Chest pain    a. 11/2019 Cor CTA: Calcium  score equals 0.  Normal coronary arteries.   Diastolic dysfunction    a. 11/2019 Echo: EF 60-65%.  Grade 1 diastolic dysfunction; b. 12/2021 Echo: EF 60-65%, GrI DD; c. 05/2022 Echo: EF 60-65%, no rwma. Nl RV fxn. RVSP 43.40mmHg. Sev dil LA. Mod MR.   GERD (gastroesophageal reflux disease)    History of cervical cancer    Hyperlipidemia    Hypertension    Mitral regurgitation    a. 12/22023 Echo: Mild-mod MR; b. 05/2022 Echo: Mod MR.   PAH (pulmonary artery hypertension) (HCC)    a. 11/2019 Echo: RVSP ; b .12/2021 Echo: RVSP 39.79mmHg; c. 05/2022 Echo: RVSP 43.54mmHg.   Persistent atrial fibrillation (HCC)    a. Dx 04/2022-->Eliquis  (CHA2DS2VASc = 3); b. 06/2022 Zio: predominantly sinus rhythm at a rate of 66 bpm (range 44-154), 2 brief runs of SVT (fastest 154 bpm x 20 beats) and otherwise rare PACs and PVCs.   Sleep-disordered breathing      ASSESSMENT: 73 y.o. female with PMH HTN, CKD is presenting with SOB and elevated troponins, concerning for ACS. Patient is on chronic anticoagulation with apixaban  per chart review. Last dose was yesterday evening at bedtime. Baseline antiXa is >1.10, reflecting false elevation from recent apixaban . Pharmacy has been consulted to initiate and manage heparin intravenous infusion.  Pertinent medications: Apixaban  5mg  twice daily Last dose 6/3 @ 2300  Goal(s) of therapy: Heparin level 0.3 - 0.7 units/mL aPTT 66 - 102 seconds Monitor platelets by anticoagulation protocol: Yes   Baseline anticoagulation labs: Recent Labs    06/29/23 0723 06/29/23 1422 06/29/23 2254  APTT  --  33 114*  INR  --  1.3*  --   HGB 11.0*  --   --   PLT 352  --   --     Date Time aPTT/HL Rate/Comment 6/4       2254      114  SUPRAtherapeutic @ 1150    PLAN: 6/4:  aPTT @ 2254 = 114, SUPRAtherapeutic  - will decrease heparin drip rate to 1000 units/hr and recheck aPTT and HL on 6/5 @ 0900.  Continue to titrate by aPTT until heparin level and aPTT correlate and/or apixaban  washes out, then titrate by heparin level alone. Continue to monitor CBC daily while on heparin infusion.   Brynnlie Unterreiner D Clinical Pharmacist 06/30/2023 1:06 AM

## 2023-06-30 NOTE — Progress Notes (Signed)
 Rounding Note   Patient Name: Whitney Stuart Date of Encounter: 06/30/2023  [facility] Cardiologist: Antionette Kirks, MD   Subjective  Cardiac catheterization done this morning via the right radial artery showed normal coronary arteries, normal ejection fraction and normal left ventricular end-diastolic pressure.  She is maintaining in sinus rhythm.  Scheduled Meds:  [MAR Hold] amiodarone  400 mg Oral BID   Followed by   [UXL Hold] amiodarone  200 mg Oral BID   Followed by   [KGM Hold] amiodarone  200 mg Oral Daily   [MAR Hold] cyclobenzaprine   5 mg Oral QHS   [MAR Hold] famotidine  20 mg Oral Daily   [MAR Hold] fluticasone  furoate-vilanterol  1 puff Inhalation Daily   [MAR Hold] loratadine  10 mg Oral Daily   [MAR Hold] rosuvastatin   5 mg Oral Daily   [MAR Hold] sertraline   50 mg Oral Daily   sodium chloride  flush  3 mL Intravenous Q12H   Continuous Infusions:  sodium chloride  75 mL/hr at 06/30/23 1029   sodium chloride      PRN Meds: sodium chloride , [MAR Hold] acetaminophen , [MAR Hold] albuterol , [MAR Hold] clonazePAM , [MAR Hold] nitroGLYCERIN , [MAR Hold] ondansetron  (ZOFRAN ) IV, sodium chloride  flush   Vital Signs  Vitals:   06/30/23 1045 06/30/23 1100 06/30/23 1115 06/30/23 1130  BP: (!) 141/68 (!) 140/77 137/67 (!) 147/70  Pulse: (!) 59 61 60 61  Resp: 17 19 18 14   Temp:      TempSrc:      SpO2: 94% 97% 96% 98%  Weight:      Height:        Intake/Output Summary (Last 24 hours) at 06/30/2023 1137 Last data filed at 06/30/2023 0615 Gross per 24 hour  Intake 468.86 ml  Output --  Net 468.86 ml      06/29/2023    8:38 PM 06/29/2023    7:10 AM 06/28/2023    1:56 PM  Last 3 Weights  Weight (lbs) 183 lb 11.2 oz 180 lb 184 lb  Weight (kg) 83.326 kg 81.647 kg 83.462 kg      Telemetry Normal sinus rhythm- Personally Reviewed  ECG   - Personally Reviewed  Physical Exam  GEN: No acute distress.   Neck: No JVD Cardiac: RRR, no murmurs, rubs, or  gallops.  Respiratory: Clear to auscultation bilaterally. GI: Soft, nontender, non-distended  MS: No edema; No deformity. Neuro:  Nonfocal  Psych: Normal affect   Labs High Sensitivity Troponin:   Recent Labs  Lab 06/29/23 0919 06/29/23 1213 06/29/23 1422 06/29/23 1847 06/29/23 2254  TROPONINIHS 282* 1,004* 1,626* 1,848* 1,798*     Chemistry Recent Labs  Lab 06/29/23 0723 06/30/23 0512  NA 137 140  K 4.0 4.1  CL 108 114*  CO2 22 18*  GLUCOSE 121* 90  BUN 20 18  CREATININE 1.20* 1.12*  CALCIUM  9.2 8.6*  PROT 7.0  --   ALBUMIN 3.9  --   AST 17  --   ALT 16  --   ALKPHOS 48  --   BILITOT 0.8  --   GFRNONAA 48* 52*  ANIONGAP 7 8    Lipids No results for input(s): "CHOL", "TRIG", "HDL", "LABVLDL", "LDLCALC", "CHOLHDL" in the last 168 hours.  Hematology Recent Labs  Lab 06/29/23 0723 06/30/23 0512  WBC 6.8 8.7  RBC 3.47* 3.30*  HGB 11.0* 10.5*  HCT 33.3* 33.1*  MCV 96.0 100.3*  MCH 31.7 31.8  MCHC 33.0 31.7  RDW 12.5 12.9  PLT  352 253   Thyroid  No results for input(s): "TSH", "FREET4" in the last 168 hours.  BNP Recent Labs  Lab 06/29/23 0723  BNP 270.2*    DDimer No results for input(s): "DDIMER" in the last 168 hours.   Radiology  CARDIAC CATHETERIZATION Result Date: 06/30/2023   The left ventricular systolic function is normal.   LV end diastolic pressure is normal.   The left ventricular ejection fraction is 55-65% by visual estimate. 1.  Normal coronary arteries. 2.  Normal LV systolic function normal left ventricular end-diastolic pressure. Recommendations: Elevated troponin seems to be due to supply demand ischemia.  Continue treatment of atrial fibrillation. Eliquis   can be resumed tomorrow morning.   DG Chest Port 1 View Result Date: 06/29/2023 EXAM: 1 VIEW XRAY OF THE CHEST 06/29/2023 07:25:18 AM COMPARISON: None available. CLINICAL HISTORY: Shortness of breath Pt also reports her bilateral hands and arms have been numb for several weeks.  FINDINGS: LUNGS AND PLEURA: No focal pulmonary opacity. Mild pulmonary vascular congestion is present without frank edema. No pleural effusion. No pneumothorax. HEART AND MEDIASTINUM: Heart size is at the upper limits of normal. No acute abnormality of the mediastinal silhouette. BONES AND SOFT TISSUES: No acute osseous abnormality. IMPRESSION: 1. Borderline cardiomegaly. 2. Mild pulmonary vascular congestion without frank edema. Electronically signed by: Audree Leas MD 06/29/2023 07:38 AM EDT RP Workstation: [id number]    Cardiac Studies   Patient Profile   73 y.o. female with history of paroxysmal atrial fibrillation, moderate mitral regurgitation, essential hypertension and hyperlipidemia who presented with A-fib with RVR  Assessment & Plan  1.  Atrial fibrillation with rapid ventricular response: She converted to sinus rhythm with IV amiodarone.  Given severity of her episode and association with chest pain and elevated troponin, rhythm control is indicated.  She was on small dose metoprolol  which could not be uptitrated due to resting bradycardia.  I agree with starting her on oral amiodarone.  Eliquis  can be resumed tomorrow morning.  Will arrange for outpatient EP consultation to discuss alternatives to amiodarone or A-fib ablation.  2.  Elevated troponin: This is due to supply demand mismatch.  Cardiac catheterization done today shows normal coronary arteries.  The patient can be discharged home from a cardiac standpoint.  I discussed with Dr. Mason Sole.     For questions or updates, please contact [facility] Please consult [email or web address] for contact info under     Signed, Antionette Kirks, MD  06/30/2023, 11:37 AM

## 2023-06-30 NOTE — TOC CM/SW Note (Signed)
 Transition of Care [facility]) - Inpatient Brief Assessment   Patient Details  Name: Whitney Stuart MRN: [id number] Date of Birth: Mar 19, 1950  Transition of Care [facility]) CM/SW Contact:    Odilia Bennett, LCSW Phone Number: 06/30/2023, 1:20 PM   Clinical Narrative: Patient has orders to discharge home today. Chart reviewed. No TOC needs identified. CSW signing off.  Transition of Care Asessment: Insurance and Status: Insurance coverage has been reviewed Patient has primary care physician: Yes Home environment has been reviewed: Single family home Prior level of function:: Not documented Prior/Current Home Services: No current home services Social Drivers of Health Review: SDOH reviewed no interventions necessary Readmission risk has been reviewed: Yes Transition of care needs: no transition of care needs at this time

## 2023-06-30 NOTE — Care Management Obs Status (Signed)
 MEDICARE OBSERVATION STATUS NOTIFICATION   Patient Details  Name: Whitney Stuart MRN: [id number] Date of Birth: 10-Jun-1950   Medicare Observation Status Notification Given:  Yes    Odilia Bennett, LCSW 06/30/2023, 1:18 PM

## 2023-07-02 NOTE — Discharge Summary (Signed)
 Physician Discharge Summary   Patient: Whitney Stuart MRN: [id number] DOB: 1950-09-21  Admit date:     06/29/2023  Discharge date: 06/30/2023  Discharge Physician: Brenna Cam   PCP: Mazie Speed, MD   Recommendations at discharge:   Follow-up with outpatient providers as requested  Discharge Diagnoses: Principal Problem:   NSTEMI (non-ST elevated myocardial infarction) (HCC) Active Problems:   Atrial fibrillation with rapid ventricular response (HCC)   Polyneuropathy   Essential (primary) hypertension   Chronic kidney disease, stage 3a (HCC)   Diastolic dysfunction   Demand ischemia [facility])  Hospital Course: Assessment and Plan:  73 y.o. female with medical history significant of Paroxysmal atrial fibrillation, mitral regurgitation, hypertension, hyperlipidemia, HFpEF, who presents to the ED due to shortness of breath and found to have A-fib with RVR   *Elevated troponin due to supply/demand ischemia due to rapid A-fib-type II MI Patient presenting with shortness of breath, found to be in atrial fibrillation with RVR, subsequently developing chest pain.  Troponins have increased, and peaked at 1848.  Likely type II NSTEMI.   - Cardiac catheterization done on 6/5 showed normal coronary arteries, normal ejection fraction and normal left ventricular end-diastolic pressure.   Atrial fibrillation with RVR (HCC) Patient presenting with A-fib and RVR, with conversion to sinus rhythm after initiating amiodarone .  She has maintained sinus rhythm at this time. -Seen by cardiology.  Started on amiodarone  orally at discharge.  Eliquis  to be resumed tomorrow morning and plan for outpatient EP evaluation for possible ablation versus alternative to amiodarone    Polyneuropathy Patient reports several week to months history of bilateral upper extremity numbness that began on the left before being present on the right, in addition to jerking spasms of her left lower extremity.  During this  time, she is also experienced difficulty with ambulation.  She has been seeing neurosurgery for this with normal TSH, vitamin B12.  Cervical x-ray with severe C-spine disease.  Outpatient neurosurgery and neurology evaluation.  Patient is not interested in getting any further evaluation while in the hospital and prefers to go home   Diastolic dysfunction Per chart review, patient has a history of diastolic dysfunction. Last echocardiogram in May 2024 demonstrating LVEF of 60-65%.  She appears euvolemic at this time.   Chronic kidney disease, stage 3a (HCC) History of CKD stage IIIa with creatinine ranging between 1.1 and 1.4, currently at baseline.   Essential (primary) hypertension       Consultants: Cardiology Procedures performed: Cardiac cath on 6/5 Disposition: Home Diet recommendation:  Discharge Diet Orders (From admission, onward)     Start     Ordered   06/30/23 0000  Diet - low sodium heart healthy        06/30/23 1050           Carb modified diet DISCHARGE MEDICATION: Allergies as of 06/30/2023       Reactions   Methylprednisolone Palpitations        Medication List     STOP taking these medications    celecoxib 200 MG capsule Commonly known as: CELEBREX   gabapentin  100 MG capsule Commonly known as: NEURONTIN    loratadine  10 MG tablet Commonly known as: CLARITIN    metoprolol  tartrate 25 MG tablet Commonly known as: LOPRESSOR        TAKE these medications    albuterol  108 (90 Base) MCG/ACT inhaler Commonly known as: VENTOLIN  HFA Inhale 2 puffs into the lungs every 6 (six) hours as needed for wheezing or shortness of  breath.   amiodarone  200 MG tablet Commonly known as: PACERONE  Take 2 tablets (400 mg total) by mouth 2 (two) times daily for 7 days, THEN 1 tablet (200 mg total) 2 (two) times daily for 7 days, THEN 1 tablet (200 mg total) daily. Start taking on: June 30, 2023   apixaban  5 MG Tabs tablet Commonly known as: ELIQUIS  Take 1  tablet (5 mg total) by mouth 2 (two) times daily.   clonazePAM  0.5 MG tablet Commonly known as: KLONOPIN  Take 0.5-1 tablets (0.25-0.5 mg total) by mouth 2 (two) times daily as needed for anxiety.   cyclobenzaprine  5 MG tablet Commonly known as: FLEXERIL  Take 1 tablet (5 mg total) by mouth at bedtime.   fluticasone  furoate-vilanterol 200-25 MCG/ACT Aepb Commonly known as: Breo Ellipta  Inhale 1 puff into the lungs daily.   lisinopril  10 MG tablet Commonly known as: ZESTRIL  Take 1 tablet (10 mg total) by mouth daily.   nitroGLYCERIN  0.4 MG SL tablet Commonly known as: Nitrostat  Place 1 tablet (0.4 mg total) under the tongue every 5 (five) minutes as needed for chest pain.   Rollator Lexicographer for gait stability to prevent falls.   rosuvastatin  5 MG tablet Commonly known as: CRESTOR  TAKE 1 TABLET (5 MG TOTAL) BY MOUTH DAILY. STOP ATORVASTATIN    sertraline  50 MG tablet Commonly known as: ZOLOFT  Take 1 tablet (50 mg total) by mouth daily.        Follow-up Information     Bacigalupo, Stan Eans, MD. Schedule an appointment as soon as possible for a visit in 1 week(s).   Specialty: Family Medicine Why: [facility] Discharge F/UP Contact information: [address] [place] [address] [phone]                Discharge Exam: Cleavon Curls Weights   06/29/23 0710 06/29/23 2038  Weight: 81.6 kg 83.3 kg   Constitutional:      General: She is not in acute distress.    Appearance: She is normal weight. She is not toxic-appearing.  HENT:     Head: Normocephalic and atraumatic.     Mouth/Throat:     Mouth: Mucous membranes are moist.     Pharynx: Oropharynx is clear.  Cardiovascular:     Rate and Rhythm: Normal rate and regular rhythm.  Pulmonary:     Effort: Pulmonary effort is normal. No tachypnea.     Breath sounds: Normal breath sounds.  Abdominal:     General: Bowel sounds are normal.     Palpations: Abdomen is soft.   Musculoskeletal:     Right lower leg: No edema.     Left lower leg: No edema.  Skin:    General: Skin is warm and dry.  Neurological:     Mental Status: She is alert and oriented to person, place, and time.     Motor: No weakness.  Psychiatric:        Mood and Affect: Mood normal.        Behavior: Behavior normal.   Condition at discharge: good  The results of significant diagnostics from this hospitalization (including imaging, microbiology, ancillary and laboratory) are listed below for reference.   Imaging Studies: CARDIAC CATHETERIZATION Result Date: 06/30/2023   The left ventricular systolic function is normal.   LV end diastolic pressure is normal.   The left ventricular ejection fraction is 55-65% by visual estimate. 1.  Normal coronary arteries. 2.  Normal LV systolic function normal left ventricular end-diastolic pressure. Recommendations:  Elevated troponin seems to be due to supply demand ischemia.  Continue treatment of atrial fibrillation. Eliquis   can be resumed tomorrow morning.   DG Chest Port 1 View Result Date: 06/29/2023 EXAM: 1 VIEW XRAY OF THE CHEST 06/29/2023 07:25:18 AM COMPARISON: None available. CLINICAL HISTORY: Shortness of breath Pt also reports her bilateral hands and arms have been numb for several weeks. FINDINGS: LUNGS AND PLEURA: No focal pulmonary opacity. Mild pulmonary vascular congestion is present without frank edema. No pleural effusion. No pneumothorax. HEART AND MEDIASTINUM: Heart size is at the upper limits of normal. No acute abnormality of the mediastinal silhouette. BONES AND SOFT TISSUES: No acute osseous abnormality. IMPRESSION: 1. Borderline cardiomegaly. 2. Mild pulmonary vascular congestion without frank edema. Electronically signed by: Audree Leas MD 06/29/2023 07:38 AM EDT RP Workstation: [id number]    Microbiology: Results for orders placed or performed in visit on 11/04/21  Microscopic Examination     Status: Abnormal    Collection Time: 11/04/21  3:36 PM   Urine  Result Value Ref Range Status   WBC, UA 11-30 (A) 0 - 5 /hpf Final   RBC, Urine 0-2 0 - 2 /hpf Final   Epithelial Cells (non renal) 0-10 0 - 10 /hpf Final   Bacteria, UA Moderate (A) None seen/Few Final    Labs: CBC: Recent Labs  Lab 06/29/23 0723 06/30/23 0512  WBC 6.8 8.7  NEUTROABS 3.6  --   HGB 11.0* 10.5*  HCT 33.3* 33.1*  MCV 96.0 100.3*  PLT 352 253   Basic Metabolic Panel: Recent Labs  Lab 06/29/23 0723 06/30/23 0512  NA 137 140  K 4.0 4.1  CL 108 114*  CO2 22 18*  GLUCOSE 121* 90  BUN 20 18  CREATININE 1.20* 1.12*  CALCIUM  9.2 8.6*   Liver Function Tests: Recent Labs  Lab 06/29/23 0723  AST 17  ALT 16  ALKPHOS 48  BILITOT 0.8  PROT 7.0  ALBUMIN 3.9   CBG: No results for input(s): "GLUCAP" in the last 168 hours.  Discharge time spent: greater than 30 minutes.  Signed: Brenna Cam, MD [facility] 07/02/2023

## 2023-07-03 ENCOUNTER — Other Ambulatory Visit: Payer: Self-pay | Admitting: Family Medicine

## 2023-07-04 ENCOUNTER — Ambulatory Visit

## 2023-07-04 ENCOUNTER — Ambulatory Visit: Admission: RE | Admit: 2023-07-04 | Source: Ambulatory Visit

## 2023-07-06 DIAGNOSIS — I129 Hypertensive chronic kidney disease with stage 1 through stage 4 chronic kidney disease, or unspecified chronic kidney disease: Secondary | ICD-10-CM | POA: Diagnosis not present

## 2023-07-06 DIAGNOSIS — D631 Anemia in chronic kidney disease: Secondary | ICD-10-CM | POA: Diagnosis not present

## 2023-07-06 DIAGNOSIS — N1831 Chronic kidney disease, stage 3a: Secondary | ICD-10-CM | POA: Diagnosis not present

## 2023-07-06 DIAGNOSIS — I10 Essential (primary) hypertension: Secondary | ICD-10-CM | POA: Diagnosis not present

## 2023-07-08 ENCOUNTER — Other Ambulatory Visit: Payer: Self-pay | Admitting: Cardiovascular Disease

## 2023-07-09 ENCOUNTER — Ambulatory Visit
Admission: RE | Admit: 2023-07-09 | Discharge: 2023-07-09 | Disposition: A | Discharge status: Home / Self Care | Source: Ambulatory Visit | Attending: Orthopedic Surgery | Admitting: Orthopedic Surgery

## 2023-07-09 DIAGNOSIS — R292 Abnormal reflex: Secondary | ICD-10-CM | POA: Insufficient documentation

## 2023-07-09 DIAGNOSIS — M47812 Spondylosis without myelopathy or radiculopathy, cervical region: Secondary | ICD-10-CM

## 2023-07-09 DIAGNOSIS — M4802 Spinal stenosis, cervical region: Secondary | ICD-10-CM | POA: Diagnosis not present

## 2023-07-09 DIAGNOSIS — R200 Anesthesia of skin: Secondary | ICD-10-CM | POA: Diagnosis not present

## 2023-07-09 DIAGNOSIS — R2681 Unsteadiness on feet: Secondary | ICD-10-CM | POA: Diagnosis not present

## 2023-07-09 DIAGNOSIS — R258 Other abnormal involuntary movements: Secondary | ICD-10-CM | POA: Diagnosis not present

## 2023-07-09 DIAGNOSIS — M4314 Spondylolisthesis, thoracic region: Secondary | ICD-10-CM | POA: Diagnosis not present

## 2023-07-09 DIAGNOSIS — M5416 Radiculopathy, lumbar region: Secondary | ICD-10-CM | POA: Diagnosis not present

## 2023-07-09 DIAGNOSIS — R202 Paresthesia of skin: Secondary | ICD-10-CM | POA: Diagnosis not present

## 2023-07-09 DIAGNOSIS — M5412 Radiculopathy, cervical region: Secondary | ICD-10-CM | POA: Diagnosis not present

## 2023-07-09 DIAGNOSIS — M4316 Spondylolisthesis, lumbar region: Secondary | ICD-10-CM | POA: Diagnosis not present

## 2023-07-09 DIAGNOSIS — M5134 Other intervertebral disc degeneration, thoracic region: Secondary | ICD-10-CM | POA: Diagnosis not present

## 2023-07-09 DIAGNOSIS — G9589 Other specified diseases of spinal cord: Secondary | ICD-10-CM | POA: Diagnosis not present

## 2023-07-09 DIAGNOSIS — M4804 Spinal stenosis, thoracic region: Secondary | ICD-10-CM | POA: Diagnosis not present

## 2023-07-09 DIAGNOSIS — M5135 Other intervertebral disc degeneration, thoracolumbar region: Secondary | ICD-10-CM | POA: Diagnosis not present

## 2023-07-09 DIAGNOSIS — M51369 Other intervertebral disc degeneration, lumbar region without mention of lumbar back pain or lower extremity pain: Secondary | ICD-10-CM | POA: Diagnosis not present

## 2023-07-09 DIAGNOSIS — M4312 Spondylolisthesis, cervical region: Secondary | ICD-10-CM | POA: Diagnosis not present

## 2023-07-09 DIAGNOSIS — M5030 Other cervical disc degeneration, unspecified cervical region: Secondary | ICD-10-CM | POA: Diagnosis not present

## 2023-07-09 DIAGNOSIS — M48061 Spinal stenosis, lumbar region without neurogenic claudication: Secondary | ICD-10-CM | POA: Diagnosis not present

## 2023-07-11 ENCOUNTER — Ambulatory Visit (INDEPENDENT_AMBULATORY_CARE_PROVIDER_SITE_OTHER): Admitting: Family Medicine

## 2023-07-11 ENCOUNTER — Encounter: Payer: Self-pay | Admitting: Family Medicine

## 2023-07-11 VITALS — BP 109/55 | HR 63 | Temp 97.9°F | Resp 16 | Wt 181.0 lb

## 2023-07-11 DIAGNOSIS — I4891 Unspecified atrial fibrillation: Secondary | ICD-10-CM | POA: Diagnosis not present

## 2023-07-11 DIAGNOSIS — G629 Polyneuropathy, unspecified: Secondary | ICD-10-CM | POA: Diagnosis not present

## 2023-07-11 DIAGNOSIS — I214 Non-ST elevation (NSTEMI) myocardial infarction: Secondary | ICD-10-CM | POA: Diagnosis not present

## 2023-07-11 DIAGNOSIS — M5412 Radiculopathy, cervical region: Secondary | ICD-10-CM | POA: Diagnosis not present

## 2023-07-11 DIAGNOSIS — F411 Generalized anxiety disorder: Secondary | ICD-10-CM

## 2023-07-11 MED ORDER — SERTRALINE HCL 100 MG PO TABS: 100.0000 mg | ORAL_TABLET | Freq: Every day | ORAL | 1 refills | Status: AC

## 2023-07-11 NOTE — Assessment & Plan Note (Signed)
 Whitney Stuart has been having 2 months  of bilateral upper extremity numbness and jerking spasms of her left lower extremity which have worsened in the past few weeks. 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 Patient has just had MRI of entire spine and we discussed patients's exam findings today with Neurosurgery who want to see her in clinic on Wednesday 6/18. She is extremely weak in the upper extremities with left worse than right and she has hyperreflexia in the left lower extremity. -Neurosurgery calling radiology to expedite read on her scans -Will see neurosurgery in clinic on 6/18 -considered starting patient on steroids for neuroprotection but patient has been put into AFIB by methylprednisolone in the past so we will hold off until neurosurgery evaluates her

## 2023-07-11 NOTE — Assessment & Plan Note (Signed)
 Patient presented to the ER initially due to her weakness and neuropathic symptoms on 06/29/2023 and was found to be in Afib with RVR. They started her on amiodarone  and she was converted to sinus rhythm. She has been tolerating this medication well. Patient diagnosed with NSTEMI on 06/29/23. Cath did not show any abnormalities in her coronary arteries. She has an appointment to follow up with cardiology on 07/20/23. She has restarted her eliquis  following being on heparin  in the hospital. -Continue amiodarone  and eliquis  -Follow up with cardiology -Monitor LFTs, PFTs, and TFTs in the future

## 2023-07-11 NOTE — Assessment & Plan Note (Signed)
 Patient diagnosed with NSTEMI on 06/29/23. Cath did not show any abnormalities in her coronary arteries. She was in Afib with RVR which they started her on amiodarone  for. She has been tolerating this medication well. She has an appointment to follow up with cardiology on 07/20/23 -Continue amiodarone  -Follow up with cardiology -Monitor LFTs, PFTs, and TFTs in the future

## 2023-07-11 NOTE — Progress Notes (Signed)
 Established Patient Office Visit  Subjective   Patient ID: Whitney Stuart, female    DOB: 1951/01/06  Age: 73 y.o. MRN: [id number]  Chief Complaint  Patient presents with   Hospitalization Follow-up    Admit date: 06/29/2023 Discharge date: 06/30/2023 Dx: NSTEMI (non-ST elevated myocardial infarction)     Whitney Stuart is a 73 y/o female with history of recent NSTEMI, diastolic dysfunction, polyneuropathy, and CKD presenting today for hospital follow up. She presented to the ER on 06/29/23 where she was found to be in AFIB w/RVR. She ended up being started on amiodarone  which resolved her arrhythmia. She underwent cardiac catheterization which did not reveal any abnormalities in her coronary arteries. She was admitted overnight and was discharged on 06/30/2023. Since then she has followed up with nephrology for her CKD which has actually improved since her last visit. She was made an appointment with cardiology which she has on 07/20/23. She has tolerated the amiodarone  well so far. She had anxiety previously well managed on 50mg  of Sertraline  but since her NSTEMI she has been experiencing worsening anxiety. She denies any new onset shortness of breath or heart palpitations but does endorse occasional  chest tightness which is occurring when she is feeling anxious.  Of note, prior to her hospitalization she was being worked up for a polyneuropathy by neurosurgery. Around 2 months ago she had started to develop numbness and weakness in both of her arms as well as some spasticity in her lower extremities. This was initially more mild but has steadily progressed over the past few weeks. She started to need a walker which then progressed to a Rolator. In the past couple of days, her legs have been so unsteady and weak that she has been unable to walk and is brought in today being rolled in her chair by her daughter. She denies any changes to bowel or bladder dysfunction but she does endorse global weakness  which is worse on her left side. Her condition has worsened significantly over the past two months with more notable deterioration in the past few weeks.    Past Medical History:  Diagnosis Date   Arthritis    Asthma    Asthma    Chest pain    a. 11/2019 Cor CTA: Calcium  score equals 0.  Normal coronary arteries.   Diastolic dysfunction    a. 11/2019 Echo: EF 60-65%.  Grade 1 diastolic dysfunction; b. 12/2021 Echo: EF 60-65%, GrI DD; c. 05/2022 Echo: EF 60-65%, no rwma. Nl RV fxn. RVSP 43.35mmHg. Sev dil LA. Mod MR.   GERD (gastroesophageal reflux disease)    History of cervical cancer    Hyperlipidemia    Hypertension    Mitral regurgitation    a. 12/22023 Echo: Mild-mod MR; b. 05/2022 Echo: Mod MR.   PAH (pulmonary artery hypertension) (HCC)    a. 11/2019 Echo: RVSP ; b .12/2021 Echo: RVSP 39.3mmHg; c. 05/2022 Echo: RVSP 43.37mmHg.   Persistent atrial fibrillation (HCC)    a. Dx 04/2022-->Eliquis  (CHA2DS2VASc = 3); b. 06/2022 Zio: predominantly sinus rhythm at a rate of 66 bpm (range 44-154), 2 brief runs of SVT (fastest 154 bpm x 20 beats) and otherwise rare PACs and PVCs.   Sleep-disordered breathing    Past Surgical History:  Procedure Laterality Date   ABDOMINAL HYSTERECTOMY  1999   total   HAND SURGERY Bilateral 2003   carpal tunnel   LEFT HEART CATH AND CORONARY ANGIOGRAPHY N/A 06/30/2023   Procedure: LEFT HEART CATH AND  CORONARY ANGIOGRAPHY;  Surgeon: Wenona Hamilton, MD;  Location: [facility] INVASIVE CV LAB;  Service: Cardiovascular;  Laterality: N/A;   LIPOMA EXCISION       Objective:     BP (!) 109/55 (BP Location: Left Arm, Patient Position: Sitting, Cuff Size: Normal)   Pulse 63   Temp 97.9 F (36.6 C) (Oral)   Resp 16   Wt 181 lb (82.1 kg)   SpO2 100%   BMI 28.35 kg/m  BP Readings from Last 3 Encounters:  07/11/23 (!) 109/55  06/30/23 137/82  06/28/23 (!) 164/92   Wt Readings from Last 3 Encounters:  07/11/23 181 lb (82.1 kg)  06/29/23 183 lb 11.2 oz  (83.3 kg)  06/28/23 184 lb (83.5 kg)      Physical Exam Constitutional:      General: She is not in acute distress.    Appearance: Normal appearance.  HENT:     Head: Normocephalic and atraumatic.     Right Ear: External ear normal.     Left Ear: External ear normal.     Nose: Nose normal. No congestion.     Mouth/Throat:     Mouth: Mucous membranes are moist.     Pharynx: Oropharynx is clear. No oropharyngeal exudate.   Eyes:     General: No scleral icterus.    Extraocular Movements: Extraocular movements intact.     Conjunctiva/sclera: Conjunctivae normal.     Pupils: Pupils are equal, round, and reactive to light.    Cardiovascular:     Rate and Rhythm: Rhythm irregular.     Pulses: Normal pulses.     Heart sounds: No murmur heard.    No friction rub. No gallop.  Pulmonary:     Effort: Pulmonary effort is normal. No respiratory distress.     Breath sounds: Normal breath sounds. No wheezing.  Abdominal:     General: Abdomen is flat. There is no distension.     Palpations: Abdomen is soft.     Tenderness: There is no abdominal tenderness.   Musculoskeletal:        General: No swelling or tenderness.     Cervical back: Normal range of motion. No rigidity.     Right lower leg: No edema.     Left lower leg: No edema.     Comments: Left shoulder passive ROM limited by patient's weakness  Lymphadenopathy:     Cervical: No cervical adenopathy.   Skin:    General: Skin is warm and dry.     Coloration: Skin is not jaundiced or pale.     Findings: No bruising.   Neurological:     Mental Status: She is alert and oriented to person, place, and time.     Cranial Nerves: No cranial nerve deficit.     Sensory: Sensory deficit present.     Motor: Weakness present.     Coordination: Coordination abnormal.     Gait: Gait abnormal.     Deep Tendon Reflexes: Reflexes abnormal.     Comments: Patient found to be extremely weak on exam and unable to resist finger adduction and  shoulder abduction 2-3/5. Patient has noted hyperreflexia in bilateral achilles testing 3+. Patient has left leg twitching and jerking at rest. Numb and weak up on the entirety of both arms with left worse than right.  Psychiatric:        Thought Content: Thought content normal.        Judgment: Judgment normal.     Comments: Appears  anxious      No results found for any visits on 07/11/23.  Last CBC Lab Results  Component Value Date   WBC 8.7 06/30/2023   HGB 10.5 (L) 06/30/2023   HCT 33.1 (L) 06/30/2023   MCV 100.3 (H) 06/30/2023   MCH 31.8 06/30/2023   RDW 12.9 06/30/2023   PLT 253 06/30/2023   Last metabolic panel Lab Results  Component Value Date   GLUCOSE 90 06/30/2023   NA 140 06/30/2023   K 4.1 06/30/2023   CL 114 (H) 06/30/2023   CO2 18 (L) 06/30/2023   BUN 18 06/30/2023   CREATININE 1.12 (H) 06/30/2023   GFRNONAA 52 (L) 06/30/2023   CALCIUM  8.6 (L) 06/30/2023   PROT 7.0 06/29/2023   ALBUMIN 3.9 06/29/2023   LABGLOB 3.0 05/24/2023   AGRATIO 1.4 08/17/2021   BILITOT 0.8 06/29/2023   ALKPHOS 48 06/29/2023   AST 17 06/29/2023   ALT 16 06/29/2023   ANIONGAP 8 06/30/2023   Last lipids Lab Results  Component Value Date   CHOL 161 02/21/2023   HDL 53 02/21/2023   LDLCALC 86 02/21/2023   TRIG 122 02/21/2023   CHOLHDL 3.0 02/21/2023   Last hemoglobin A1c No results found for: HGBA1C Last thyroid  functions Lab Results  Component Value Date   TSH 0.773 05/24/2023   Last vitamin B12 and Folate Lab Results  Component Value Date   VITAMINB12 861 05/24/2023   FOLATE >20.0 08/17/2021      The ASCVD Risk score (Arnett DK, et al., 2019) failed to calculate for the following reasons:   Risk score cannot be calculated because patient has a medical history suggesting prior/existing ASCVD    Assessment & Plan:   Problem List Items Addressed This Visit       Cardiovascular and Mediastinum   NSTEMI (non-ST elevated myocardial infarction) (HCC) -  Primary   Patient diagnosed with NSTEMI on 06/29/23 2/2 demand ischemia. Cath did not show any abnormalities in her coronary arteries. She was in Afib with RVR which they started her on amiodarone  for. She has been tolerating this medication well. She has an appointment to follow up with cardiology on 07/20/23 -Continue amiodarone  -Follow up with cardiology -Monitor LFTs, PFTs, and TFTs in the future      Atrial fibrillation with rapid ventricular response [facility])   Patient presented to the ER initially due to her weakness and neuropathic symptoms on 06/29/2023 and was found to be in Afib with RVR. They started her on amiodarone  and she was converted to sinus rhythm. She has been tolerating this medication well. Patient diagnosed with NSTEMI on 06/29/23. Cath did not show any abnormalities in her coronary arteries. She has an appointment to follow up with cardiology on 07/20/23. She has restarted her eliquis  following being on heparin  in the hospital. -Continue amiodarone  and eliquis  -Follow up with cardiology -Monitor LFTs, PFTs, and TFTs in the future         Nervous and Auditory   Polyneuropathy   Whitney Stuart has been having 2 months  of bilateral upper extremity numbness and jerking spasms of her left lower extremity which have worsened in the past few weeks. 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 Patient has just had MRI of entire spine and we discussed patients's exam findings today with Neurosurgery who want to see her in clinic on Wednesday 6/18. She is extremely weak in the upper extremities with left worse than right  and she has hyperreflexia in the left lower extremity. -Neurosurgery calling radiology to expedite read on her scans -Will see neurosurgery in clinic on 6/18 -considered starting patient on steroids for neuroprotection but patient has been put into AFIB by methylprednisolone in the past so we will hold off until  neurosurgery evaluates her      Relevant Medications   sertraline  (ZOLOFT ) 100 MG tablet   Cervical radiculopathy   Navea has been having 2 months  of bilateral upper extremity numbness and jerking spasms of her left lower extremity which have worsened in the past few weeks. 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 Patient has just had MRI of entire spine and we discussed patients's exam findings today with Neurosurgery who want to see her in clinic on Wednesday 6/18. She is extremely weak in the upper extremities with left worse than right and she has hyperreflexia in the left lower extremity. -Neurosurgery calling radiology to expedite read on her scans -Will see neurosurgery in clinic on 6/18 -considered starting patient on steroids for neuroprotection but patient has been put into AFIB by methylprednisolone in the past so we will hold off until neurosurgery evaluates her      Relevant Medications   sertraline  (ZOLOFT ) 100 MG tablet     Other   GAD (generalized anxiety disorder)   Generalized anxiety disorder, previously managed with Zoloft  50 mg daily but worsened in the past few months with her NSTEMI and polyneuropathic concerns. She does have Klonopin  as needed for severe anxiety. We elected to increase Zoloft  to 100mg  to see if this can help alleviate her anxiety. Hopefully this improves as she receives treatment for her neuro symptoms. - Start Zoloft  100 mg daily -Klonopin  as needed      Relevant Medications   sertraline  (ZOLOFT ) 100 MG tablet    No follow-ups on file.    Whitney Stuart, Medical Student   Patient seen along with MS3 student, Luther Saltness. I personally evaluated this patient along with the student, and verified all aspects of the history, physical exam, and medical decision making as documented by the student. I agree with the student's documentation and have made all necessary  edits.  Whitney Stuart, Stan Eans, MD, MPH [facility]

## 2023-07-11 NOTE — Assessment & Plan Note (Signed)
 Generalized anxiety disorder, previously managed with Zoloft  50 mg daily but worsened in the past few months with her NSTEMI and polyneuropathic concerns. She does have Klonopin  as needed for severe anxiety. We elected to increase Zoloft  to 100mg  to see if this can help alleviate her anxiety. Hopefully this improves as she receives treatment for her neuro symptoms. - Start Zoloft  100 mg daily -Klonopin  as needed

## 2023-07-12 NOTE — Progress Notes (Signed)
 Referring Physician:  No referring provider defined for this encounter.  Primary Physician:  Mazie Speed, MD  History of Present Illness: 07/13/2023 Whitney Stuart has a history of HTN, pulmonary artery HTN, asthma, CKD stage 3a, obesity, hyperlipidemia.   Recent admission for NSTEMI on 06/29/23. Cath did not show any abnormalities in her coronary arteries. She also had afib with RVR.   Last seen by me on 06/28/23 for gradual decline in her mobility over last 4 months. She was very active and independent prior to this.   She's had progressive decline since her last visit with me.   She continues with constant numbness/tingling in her hands that is moving up to her forearms. She is dropping things and has weakness in her hands. She has difficulty walking with balance issues. Was able to walk with walker on Friday and now cannot.   She has intermittent LBP that is worse with standing and walking. She has intermittent pain in right anterior thigh that is worse with standing and walking.  She notes involuntary jumping of left leg that unchanged.    She she has intermittent numbness and tingling in both legs. She has weakness in both legs.   She needs 24 hour care by family and it is getting more difficult for them to take care of her.   History of carpal tunnel release bilaterally years ago.   She is on ELIQUIS .   She does not smoke.   Bowel/Bladder Dysfunction: none  Conservative measures:  Physical therapy:  has not participated in PT, did for her right leg a few months ago Multimodal medical therapy including regular antiinflammatories:  celebrex, flexeril , gabapentin , Prednisone Injections:  no epidural steroid injections  Past Surgery: no spinal surgeries  The symptoms are causing a significant impact on the patient's life.   Review of Systems:  A 10 point review of systems is negative, except for the pertinent positives and negatives detailed in the  HPI.  Past Medical History: Past Medical History:  Diagnosis Date   Arthritis    Asthma    Asthma    Chest pain    a. 11/2019 Cor CTA: Calcium  score equals 0.  Normal coronary arteries.   Diastolic dysfunction    a. 11/2019 Echo: EF 60-65%.  Grade 1 diastolic dysfunction; b. 12/2021 Echo: EF 60-65%, GrI DD; c. 05/2022 Echo: EF 60-65%, no rwma. Nl RV fxn. RVSP 43.20mmHg. Sev dil LA. Mod MR.   GERD (gastroesophageal reflux disease)    History of cervical cancer    Hyperlipidemia    Hypertension    Mitral regurgitation    a. 12/22023 Echo: Mild-mod MR; b. 05/2022 Echo: Mod MR.   PAH (pulmonary artery hypertension) (HCC)    a. 11/2019 Echo: RVSP ; b .12/2021 Echo: RVSP 39.6mmHg; c. 05/2022 Echo: RVSP 43.51mmHg.   Persistent atrial fibrillation (HCC)    a. Dx 04/2022-->Eliquis  (CHA2DS2VASc = 3); b. 06/2022 Zio: predominantly sinus rhythm at a rate of 66 bpm (range 44-154), 2 brief runs of SVT (fastest 154 bpm x 20 beats) and otherwise rare PACs and PVCs.   Sleep-disordered breathing     Past Surgical History: Past Surgical History:  Procedure Laterality Date   ABDOMINAL HYSTERECTOMY  1999   total   HAND SURGERY Bilateral 2003   carpal tunnel   LEFT HEART CATH AND CORONARY ANGIOGRAPHY N/A 06/30/2023   Procedure: LEFT HEART CATH AND CORONARY ANGIOGRAPHY;  Surgeon: Wenona Hamilton, MD;  Location: [facility] INVASIVE [facility];  Service:  Cardiovascular;  Laterality: N/A;   LIPOMA EXCISION      Allergies: Allergies as of 07/13/2023 - Review Complete 07/13/2023  Allergen Reaction Noted   Methylprednisolone Palpitations 12/29/2022    Medications: Outpatient Encounter Medications as of 07/13/2023  Medication Sig   albuterol  (VENTOLIN  HFA) 108 (90 Base) MCG/ACT inhaler Inhale 2 puffs into the lungs every 6 (six) hours as needed for wheezing or shortness of breath.   amiodarone  (PACERONE ) 200 MG tablet Take 2 tablets (400 mg total) by mouth 2 (two) times daily for 7 days, THEN 1 tablet (200 mg  total) 2 (two) times daily for 7 days, THEN 1 tablet (200 mg total) daily.   apixaban  (ELIQUIS ) 5 MG TABS tablet Take 1 tablet (5 mg total) by mouth 2 (two) times daily.   clonazePAM  (KLONOPIN ) 0.5 MG tablet Take 0.5-1 tablets (0.25-0.5 mg total) by mouth 2 (two) times daily as needed for anxiety.   cyclobenzaprine  (FLEXERIL ) 5 MG tablet TAKE 1 TABLET BY MOUTH EVERYDAY AT BEDTIME   fluticasone  furoate-vilanterol (BREO ELLIPTA ) 200-25 MCG/ACT AEPB Inhale 1 puff into the lungs daily.   lisinopril  (ZESTRIL ) 10 MG tablet Take 1 tablet (10 mg total) by mouth daily.   Misc. Devices (ROLLATOR ULTRA-LIGHT) MISC Lightweight rollator for gait stability to prevent falls.   nitroGLYCERIN  (NITROSTAT ) 0.4 MG SL tablet Place 1 tablet (0.4 mg total) under the tongue every 5 (five) minutes as needed for chest pain.   rosuvastatin  (CRESTOR ) 5 MG tablet TAKE 1 TABLET (5 MG TOTAL) BY MOUTH DAILY. STOP ATORVASTATIN    sertraline  (ZOLOFT ) 100 MG tablet Take 1 tablet (100 mg total) by mouth daily.   No facility-administered encounter medications on file as of 07/13/2023.    Social History: Social History   Tobacco Use   Smoking status: Former    Current packs/day: 0.00    Average packs/day: 0.5 packs/day for 6.0 years (3.0 ttl pk-yrs)    Types: Cigarettes    Start date: 01/25/1991    Quit date: 01/24/1997    Years since quitting: 26.4   Smokeless tobacco: Never  Vaping Use   Vaping status: Never Used  Substance Use Topics   Alcohol use: No   Drug use: No    Family Medical History: Family History  Problem Relation Age of Onset   Heart disease Mother    Stroke Mother    Congestive Heart Failure Mother    Hypertension Mother    Hypertension Father    Kidney disease Father    Heart murmur Sister    Hypertension Sister    Ovarian cancer Sister        ovarian   Hypertension Sister    Hypertension Sister    Lung cancer Brother    Hypertension Brother    Hypertension Brother    Sleep apnea Brother     Hypertension Brother    Heart disease Brother    Hypertension Brother    Hypertension Brother    Hypertension Brother    Hypertension Brother    Hypertension Brother    Mental illness Maternal Aunt    Congestive Heart Failure Daughter    Colon cancer Neg Hx    Breast cancer Neg Hx     Physical Examination: Vitals:   07/13/23 0934  BP: 112/60      Awake, alert, oriented to person, place, and time.  Speech is clear and fluent. Fund of knowledge is appropriate.   Cranial Nerves: Pupils equal round and reactive to light.  Facial tone is symmetric.  No posterior cervical tenderness.   No posterior lumbar tenderness.   No abnormal lesions on exposed skin.   Strength: Side Biceps Triceps Deltoid Interossei Grip Wrist Ext. Wrist Flex.  R 5 4+ 5 4+ 4 5 5   L 4- 4- 4 4 4  4- 4-   Side Iliopsoas Quads Hamstring PF DF EHL  R 5 5 5 5 5 5   L 5 5 5 5 5 5    She has intermittent involuntary jerking movements of left leg.   Reflexes are 3+ and symmetric at the biceps, brachioradialis, patella, and achilles.   Hoffman's is present bilaterally.   She has clonus is bilateral lower extremities.   Bilateral upper and lower extremity sensation is intact to light touch, but subjectively diminished in both arms and legs.   Gait not tested. She is 2 max assist to get up.   Medical Decision Making  Imaging: Cervical MRI dated 07/09/23:  FINDINGS: Alignment: Straightening of lordosis on the previous radiographs, now with increased appearance of multilevel degenerative appearing upper cervical spondylolisthesis (anterolisthesis of C3 on C4 and C4 on C5 up to 3 mm) and reversal mild reversal of the normal cervical lordosis.   Vertebrae: Normal background bone marrow signal and maintained vertebral height. But bulky disc and endplate degeneration, bilateral cervical facet degeneration. And on the right side there is degenerative appearing marrow edema and joint effusion of  the chronically hypertrophied C3-C4 facets (series 18, image 3). No other acute osseous abnormality.   Cord: Degenerative cord compression, moderate at C3-C4 (series 16, image 8) and continuing through C5-C6, up to severe at that level (series 19, image 19). There is subtle evidence of spinal cord signal heterogeneity at the compressed levels. Myelomalacia is favored over cord edema. Above C3 and below C6 the cord signal and morphology are normal.   Posterior Fossa, vertebral arteries, paraspinal tissues: Cervicomedullary junction is within normal limits. Negative visible posterior fossa, grossly negative other visible brain parenchyma. Preserved major vascular flow voids in the bilateral neck, the right vertebral artery has a late entry into the cervical transverse foramen at the C4-C5 level on series 19, image 14. Negative visible neck soft tissues.   Disc levels:   C2-C3: Mild to moderate disc and endplate, moderate to severe facet and ligament flavum degeneration and hypertrophy (series 19, image 5). Moderate to severe left C3 foraminal stenosis. Mild to moderate right foraminal stenosis. Mild spinal stenosis beginning just below the disc space level.   C3-C4: Moderate to severe multifactorial spinal stenosis with cord compression (series 19, image 10) in the setting of grade 1 anterolisthesis, bulky disc and endplate degeneration, moderate to severe facet and ligament flavum hypertrophy. And superimposed degenerative appearing marrow edema in the right side facets as above. Severe left and moderate to severe right C4 foraminal stenosis.   C4-C5: Similar moderate to severe spinal stenosis and cord compression in the setting of spondylolisthesis with bulky disc osteophyte degeneration (midline posterior component series 19, image 14), moderate to severe facet and ligament flavum hypertrophy. Severe right C5 foraminal stenosis, but mild to moderate left C5 foraminal  stenosis.   C5-C6: Severe disc space loss with bulky circumferential disc osteophyte complex. Broad-based posterior component superimposed on mild to moderate facet and ligament flavum hypertrophy. Moderate to severe spinal stenosis with cord compression. Severe left and moderate to severe right C6 foraminal stenosis.   C6-C7: Severe disc space loss also but less pronounced and more anterior eccentric circumferential disc osteophyte complex. Mild spinal stenosis. No cord  mass effect. Moderate to severe left, mild right C7 foraminal stenosis.   C7-T1: Better preserved disc space. Circumferential disc bulge and endplate spurring. Mild to moderate facet and ligament flavum hypertrophy. No stenosis.   Thoracic levels reported separately today.   IMPRESSION: 1. Advanced cervical spine disc and posterior element degeneration superimposed on multilevel upper cervical spondylolisthesis. Subsequent multifactorial moderate and severe spinal stenosis with Degenerative Cord Compression C3-C4 through C5-C6. Subtle evidence of associated Spinal Cord Myelomalacia at those levels.   2. Mild cervical spinal stenosis elsewhere. Associated up to severe degenerative neural foraminal stenosis at the left C3, bilateral C4, right C5, bilateral C6, and left C7 nerve levels.   Electronically Signed: By: Marlise Simpers M.D. On: 07/13/2023 09:30  Thoracic MRI dated 07/09/23:  FINDINGS: Limited cervical spine imaging: Abnormal, detailed separately today.   Thoracic spine segmentation: Appears to be normal, concordant with the other spinal numbering today.   Alignment: Mildly exaggerated thoracic kyphosis. No significant scoliosis. Mild degenerative anterolisthesis of T2 on T3, subtle at T3-T4.   Vertebrae: Normal background bone marrow signal. Maintained thoracic vertebral body height although advanced chronic degenerative endplate irregularity and marrow signal changes T5-T6 through T10-T11. No marrow  edema or evidence of acute osseous abnormality.   Cord: Isolated degenerative mass effect on the thoracic spinal cord at T6-T7 described below. No convincing cord edema or myelomalacia, and elsewhere thoracic cord signal and morphology are within normal limits. Conus medullaris appears to be normal at T12-L1.   Paraspinal and other soft tissues: Negative.   Disc levels:   T1-T2: Circumferential disc bulge asymmetric to the right. Mild to moderate facet and ligament scar side mild to moderate facet hypertrophy. No spinal stenosis. Mild left and moderate right T1 foraminal stenosis.   T2-T3: Grade 1 anterolisthesis. Disc space loss. Disc bulging most affecting the left neural foramen. Moderate facet hypertrophy. No spinal stenosis. Mild to moderate bilateral T2 foraminal stenosis.   T3-T4: Disc space loss with mild posterior disc bulging. Moderate facet hypertrophy. No spinal stenosis. Mild-to-moderate left T3 foraminal stenosis. Mild dorsal thoracic epidural lipomatosis beginning at this level.   T4-T5: Mild disc bulge. Moderate facet hypertrophy greater on the left. Mild to moderate left, mild right T4 foraminal stenosis.   T5-T6: Severe disc space loss. Mild disc bulging. Mild facet hypertrophy. Dorsal thoracic epidural lipomatosis here but no significant spinal stenosis.   T6-T7: Broad-based right paracentral and slightly cephalad disc or disc osteophyte complex (series 20, image 20).   Superimposed on dorsal thoracic epidural lipomatosis here. Mild spinal stenosis and mild ventral cord mass effect, in part related to the epidural fat. No foraminal stenosis.   T7-T8: Less epidural lipomatosis at this level. Severe disc space loss but only mild disc bulging. No significant stenosis.   T8-T9: Epidural lipomatosis abates at this level. Disc space loss. No stenosis.   T9-T10: Disc space loss.  No stenosis.   T10-T11: Disc space loss. Circumferential disc bulging  asymmetric to the right. Mild facet and ligament flavum hypertrophy. No spinal stenosis. Mild left and moderate right T10 neural foraminal stenosis.   T11-T12: Better preserved disc space. Circumferential disc bulge. Mild facet hypertrophy. No stenosis.   T12-L1: Mild circumferential disc bulge.  No stenosis.   IMPRESSION: 1. Thoracic spine degeneration with mild upper thoracic spondylolisthesis, and up to moderate midthoracic epidural lipomatosis. No acute osseous abnormality. 2. Mild multifactorial spinal stenosis at T6-T7 with mild ventral cord mass effect. No thoracic spinal cord signal abnormality. 3. Degenerative thoracic neural foraminal  stenosis, up to moderate at the right T1, bilateral T2, left T3, left T4, right T10 nerve levels.   Electronically Signed: By: Marlise Simpers M.D. On: 07/13/2023 09:38    Lumbar MRI dated 07/09/23:  FINDINGS: Segmentation: Normal, concordant with the other spinal numbering today.   Alignment: Chronic mild dextroconvex lumbar scoliosis. Superimposed chronic anterolisthesis of L4 on L5, up to 8 mm in stable from the 2019 CT. Relatively preserved lumbar lordosis elsewhere.   Vertebrae: Normal background bone marrow signal. Maintained vertebral height. Degenerative endplate spurring and marrow signal changes in the lumbar spine, bulky anteriorly at L2-L3, sclerotic marrow signal changes there, continuing inferiorly through L5-S1. Mild superimposed anterior degenerative endplate marrow edema at L5-S1. Intact visible sacrum and SI joints. No other convincing marrow edema or acute osseous abnormality.   Conus medullaris and cauda equina: Conus extends to the L1-L2 level. No lower spinal cord or conus signal abnormality.   Paraspinal and other soft tissues: Chronic benign left renal cysts (no follow-up imaging recommended). Negative other visualized abdominal viscera. Negative paraspinal soft tissues aside patchy signal abnormality which  appears to be combined denervation with and atrophy in the right lower lumbar erector spinae muscles at L5 and the sacrum (series 17, image 4).   Disc levels:   L1-L2: Mild disc bulging. Mild to moderate facet hypertrophy. Mild left L1 foraminal stenosis.   L2-L3: Severe disc space loss. Probable vacuum disc here now, new since 2019. Bulky circumferential disc osteophyte complex asymmetric to the right. Moderate facet and ligament flavum hypertrophy. Moderate spinal stenosis (series 19, image 13) with moderate lateral recess stenosis greater on the right (descending right L3 nerve level). Moderate left L2 foraminal stenosis.   L3-L4: Better preserved disc height but bulky circumferential disc or disc osteophyte complex, asymmetric. Moderate facet and moderate to severe ligament flavum hypertrophy. Moderate spinal stenosis with lateral recess stenosis greater on the right (right L4 nerve level). Mild to moderate bilateral L3 foraminal stenosis.   L4-L5: Anterolisthesis with disc space loss. Vacuum disc here in 2019. Bulky and asymmetric disc/pseudo disc eccentric to the right and superimposed severe bilateral facet and ligament flavum hypertrophy. Very severe spinal stenosis (series 19, image 22), right greater than left lateral recess stenosis (L5 nerve levels) and very severe right L4 foraminal stenosis (series 16, image 5).   L5-S1: Chronic severe disc space loss. Vacuum disc here in 2019. Circumferential disc osteophyte complex. Mild to moderate facet hypertrophy. No spinal stenosis. Mild left lateral recess stenosis. Mild to moderate bilateral L5 foraminal stenosis.   IMPRESSION: 1. Advanced chronic lumbar spine degeneration with grade 1 spondylolisthesis at L4-L5. Severe chronic disc and posterior element degeneration there (see #2).   2. Very Severe multifactorial spinal, lateral recess and right foraminal stenosis at L4-L5. Some associated right lower erector spinae  muscle atrophy. Query Right L4 and/or L5 radiculitis.   3. Moderate multifactorial spinal, right lateral recess and right foraminal stenosis at both L2-L3 and L3-L4.   Electronically Signed: By: Marlise Simpers M.D. On: 07/13/2023 09:47  I have personally reviewed the images and agree with the above interpretation.   Assessment and Plan: Ms. Finnell has significant rapid progressive decline in mobility, use of arms, and walking over the last 2-3 weeks.    She continues with constant numbness/tingling in her hands that is moving up to her forearms. She is dropping things and has weakness in her hands. She has difficulty walking with balance issues.   She has severe cervical stenosis C3-C6 with  probable myelomalacia.   She has intermittent LBP that is worse with standing and walking. She has intermittent pain in right anterior thigh that is worse with standing and walking.  She notes involuntary jumping of left leg that unchanged.    She she has intermittent numbness and tingling in both legs. She has weakness in both legs.   Thoracic MRI shows spondylosis with small mass effect on cord at C6-C7.   Lumbar MRI shows significant spondylosis with slip L4-L5 with severe central stenosis. Also with moderate central stenosis L2-L4 and multilevel foraminal stenosis.   Weakness in her upper extremities has progressed since her last visit. She is unable to walk. She continues with hyper reflexia, positive hoffmans, and clonus.   She is hyper reflexic in her lower extremities, she has unsteady gain, positive hoffmans, and clonus in lower extremities.   Treatment options discussed with patient and family with Dr. Felipe Horton. Following plan made:   - She is myelopathic with rapidly progressive symptoms. Recommend she go to ED for admission, clearance by medicine, and cardiology and surgery in next few days.  - Prior to surgery, she will need cervical CT and flex/ext cervical xrays.  - Surgery options will  likely include anterior/posterior cervical fusion, but plan will be based on above imaging.  - Patient and family are in agreement regarding proceeding with surgery. She was headed to ED at [facility] when she left my clinic. Charge nurse with notified.   I spent a total of 45 minutes in face-to-face and non-face-to-face activities related to this patient's care today including review of outside records, review of imaging, review of symptoms, physical exam, discussion of differential diagnosis, discussion of treatment options, and documentation.   Lucetta Russel PA-C Dept. of Neurosurgery

## 2023-07-13 ENCOUNTER — Inpatient Hospital Stay
Admission: EM | Admit: 2023-07-13 | Discharge: 2023-07-23 | DRG: 430 | Disposition: A | Discharge status: Skilled Nursing Facility | Source: Ambulatory Visit | Attending: Osteopathic Medicine | Admitting: Osteopathic Medicine

## 2023-07-13 ENCOUNTER — Inpatient Hospital Stay

## 2023-07-13 ENCOUNTER — Encounter: Payer: Self-pay | Admitting: Orthopedic Surgery

## 2023-07-13 ENCOUNTER — Other Ambulatory Visit: Payer: Self-pay

## 2023-07-13 ENCOUNTER — Emergency Department

## 2023-07-13 ENCOUNTER — Ambulatory Visit (INDEPENDENT_AMBULATORY_CARE_PROVIDER_SITE_OTHER): Admitting: Orthopedic Surgery

## 2023-07-13 VITALS — BP 112/60

## 2023-07-13 DIAGNOSIS — R5383 Other fatigue: Secondary | ICD-10-CM | POA: Diagnosis present

## 2023-07-13 DIAGNOSIS — M4802 Spinal stenosis, cervical region: Secondary | ICD-10-CM | POA: Diagnosis present

## 2023-07-13 DIAGNOSIS — Z8041 Family history of malignant neoplasm of ovary: Secondary | ICD-10-CM

## 2023-07-13 DIAGNOSIS — Z8249 Family history of ischemic heart disease and other diseases of the circulatory system: Secondary | ICD-10-CM

## 2023-07-13 DIAGNOSIS — R221 Localized swelling, mass and lump, neck: Secondary | ICD-10-CM | POA: Diagnosis not present

## 2023-07-13 DIAGNOSIS — Z981 Arthrodesis status: Secondary | ICD-10-CM | POA: Diagnosis not present

## 2023-07-13 DIAGNOSIS — R262 Difficulty in walking, not elsewhere classified: Secondary | ICD-10-CM | POA: Diagnosis present

## 2023-07-13 DIAGNOSIS — E041 Nontoxic single thyroid nodule: Secondary | ICD-10-CM | POA: Diagnosis not present

## 2023-07-13 DIAGNOSIS — E663 Overweight: Secondary | ICD-10-CM | POA: Diagnosis not present

## 2023-07-13 DIAGNOSIS — E785 Hyperlipidemia, unspecified: Secondary | ICD-10-CM | POA: Diagnosis present

## 2023-07-13 DIAGNOSIS — M47896 Other spondylosis, lumbar region: Secondary | ICD-10-CM

## 2023-07-13 DIAGNOSIS — I252 Old myocardial infarction: Secondary | ICD-10-CM

## 2023-07-13 DIAGNOSIS — R258 Other abnormal involuntary movements: Secondary | ICD-10-CM | POA: Diagnosis present

## 2023-07-13 DIAGNOSIS — M2578 Osteophyte, vertebrae: Secondary | ICD-10-CM | POA: Diagnosis present

## 2023-07-13 DIAGNOSIS — I4891 Unspecified atrial fibrillation: Secondary | ICD-10-CM | POA: Diagnosis not present

## 2023-07-13 DIAGNOSIS — I480 Paroxysmal atrial fibrillation: Secondary | ICD-10-CM | POA: Diagnosis present

## 2023-07-13 DIAGNOSIS — Z823 Family history of stroke: Secondary | ICD-10-CM

## 2023-07-13 DIAGNOSIS — N1831 Chronic kidney disease, stage 3a: Secondary | ICD-10-CM | POA: Diagnosis present

## 2023-07-13 DIAGNOSIS — I6521 Occlusion and stenosis of right carotid artery: Secondary | ICD-10-CM | POA: Diagnosis not present

## 2023-07-13 DIAGNOSIS — R2681 Unsteadiness on feet: Secondary | ICD-10-CM

## 2023-07-13 DIAGNOSIS — M4804 Spinal stenosis, thoracic region: Secondary | ICD-10-CM | POA: Diagnosis present

## 2023-07-13 DIAGNOSIS — J45909 Unspecified asthma, uncomplicated: Secondary | ICD-10-CM | POA: Diagnosis present

## 2023-07-13 DIAGNOSIS — F419 Anxiety disorder, unspecified: Secondary | ICD-10-CM | POA: Diagnosis present

## 2023-07-13 DIAGNOSIS — M4712 Other spondylosis with myelopathy, cervical region: Principal | ICD-10-CM | POA: Diagnosis present

## 2023-07-13 DIAGNOSIS — R7989 Other specified abnormal findings of blood chemistry: Secondary | ICD-10-CM | POA: Diagnosis present

## 2023-07-13 DIAGNOSIS — R001 Bradycardia, unspecified: Secondary | ICD-10-CM | POA: Diagnosis not present

## 2023-07-13 DIAGNOSIS — E875 Hyperkalemia: Secondary | ICD-10-CM | POA: Diagnosis present

## 2023-07-13 DIAGNOSIS — Z841 Family history of disorders of kidney and ureter: Secondary | ICD-10-CM

## 2023-07-13 DIAGNOSIS — M6259 Muscle wasting and atrophy, not elsewhere classified, multiple sites: Secondary | ICD-10-CM | POA: Diagnosis not present

## 2023-07-13 DIAGNOSIS — M47812 Spondylosis without myelopathy or radiculopathy, cervical region: Secondary | ICD-10-CM | POA: Diagnosis not present

## 2023-07-13 DIAGNOSIS — M51369 Other intervertebral disc degeneration, lumbar region without mention of lumbar back pain or lower extremity pain: Secondary | ICD-10-CM | POA: Diagnosis not present

## 2023-07-13 DIAGNOSIS — M5000 Cervical disc disorder with myelopathy, unspecified cervical region: Secondary | ICD-10-CM | POA: Diagnosis not present

## 2023-07-13 DIAGNOSIS — I2721 Secondary pulmonary arterial hypertension: Secondary | ICD-10-CM | POA: Diagnosis present

## 2023-07-13 DIAGNOSIS — I340 Nonrheumatic mitral (valve) insufficiency: Secondary | ICD-10-CM | POA: Diagnosis present

## 2023-07-13 DIAGNOSIS — E782 Mixed hyperlipidemia: Secondary | ICD-10-CM | POA: Diagnosis present

## 2023-07-13 DIAGNOSIS — G959 Disease of spinal cord, unspecified: Secondary | ICD-10-CM

## 2023-07-13 DIAGNOSIS — Z90710 Acquired absence of both cervix and uterus: Secondary | ICD-10-CM

## 2023-07-13 DIAGNOSIS — M5412 Radiculopathy, cervical region: Secondary | ICD-10-CM | POA: Diagnosis present

## 2023-07-13 DIAGNOSIS — M4312 Spondylolisthesis, cervical region: Secondary | ICD-10-CM | POA: Diagnosis present

## 2023-07-13 DIAGNOSIS — J4520 Mild intermittent asthma, uncomplicated: Secondary | ICD-10-CM

## 2023-07-13 DIAGNOSIS — I4819 Other persistent atrial fibrillation: Secondary | ICD-10-CM | POA: Diagnosis not present

## 2023-07-13 DIAGNOSIS — R292 Abnormal reflex: Secondary | ICD-10-CM | POA: Diagnosis present

## 2023-07-13 DIAGNOSIS — K219 Gastro-esophageal reflux disease without esophagitis: Secondary | ICD-10-CM | POA: Diagnosis present

## 2023-07-13 DIAGNOSIS — Z79899 Other long term (current) drug therapy: Secondary | ICD-10-CM

## 2023-07-13 DIAGNOSIS — R29898 Other symptoms and signs involving the musculoskeletal system: Secondary | ICD-10-CM | POA: Diagnosis not present

## 2023-07-13 DIAGNOSIS — M4314 Spondylolisthesis, thoracic region: Secondary | ICD-10-CM

## 2023-07-13 DIAGNOSIS — I129 Hypertensive chronic kidney disease with stage 1 through stage 4 chronic kidney disease, or unspecified chronic kidney disease: Secondary | ICD-10-CM | POA: Diagnosis present

## 2023-07-13 DIAGNOSIS — R059 Cough, unspecified: Secondary | ICD-10-CM | POA: Diagnosis not present

## 2023-07-13 DIAGNOSIS — G9589 Other specified diseases of spinal cord: Secondary | ICD-10-CM | POA: Diagnosis present

## 2023-07-13 DIAGNOSIS — Z8541 Personal history of malignant neoplasm of cervix uteri: Secondary | ICD-10-CM

## 2023-07-13 DIAGNOSIS — M9961 Osseous and subluxation stenosis of intervertebral foramina of cervical region: Secondary | ICD-10-CM | POA: Diagnosis not present

## 2023-07-13 DIAGNOSIS — M48061 Spinal stenosis, lumbar region without neurogenic claudication: Secondary | ICD-10-CM

## 2023-07-13 DIAGNOSIS — Z01810 Encounter for preprocedural cardiovascular examination: Secondary | ICD-10-CM | POA: Diagnosis not present

## 2023-07-13 DIAGNOSIS — I10 Essential (primary) hypertension: Secondary | ICD-10-CM | POA: Diagnosis present

## 2023-07-13 DIAGNOSIS — Z801 Family history of malignant neoplasm of trachea, bronchus and lung: Secondary | ICD-10-CM

## 2023-07-13 DIAGNOSIS — Z741 Need for assistance with personal care: Secondary | ICD-10-CM | POA: Diagnosis not present

## 2023-07-13 DIAGNOSIS — Z87891 Personal history of nicotine dependence: Secondary | ICD-10-CM

## 2023-07-13 DIAGNOSIS — G7249 Other inflammatory and immune myopathies, not elsewhere classified: Secondary | ICD-10-CM | POA: Diagnosis not present

## 2023-07-13 DIAGNOSIS — E042 Nontoxic multinodular goiter: Secondary | ICD-10-CM | POA: Diagnosis not present

## 2023-07-13 DIAGNOSIS — Z888 Allergy status to other drugs, medicaments and biological substances status: Secondary | ICD-10-CM

## 2023-07-13 DIAGNOSIS — M62838 Other muscle spasm: Secondary | ICD-10-CM | POA: Diagnosis not present

## 2023-07-13 DIAGNOSIS — M799 Soft tissue disorder, unspecified: Secondary | ICD-10-CM | POA: Diagnosis not present

## 2023-07-13 DIAGNOSIS — M4852XA Collapsed vertebra, not elsewhere classified, cervical region, initial encounter for fracture: Secondary | ICD-10-CM | POA: Diagnosis not present

## 2023-07-13 DIAGNOSIS — Z4789 Encounter for other orthopedic aftercare: Secondary | ICD-10-CM | POA: Diagnosis not present

## 2023-07-13 DIAGNOSIS — Z7901 Long term (current) use of anticoagulants: Secondary | ICD-10-CM

## 2023-07-13 DIAGNOSIS — Z7409 Other reduced mobility: Secondary | ICD-10-CM | POA: Insufficient documentation

## 2023-07-13 HISTORY — DX: Chronic kidney disease, unspecified: N18.9

## 2023-07-13 LAB — URINALYSIS, ROUTINE W REFLEX MICROSCOPIC
Bacteria, UA: NONE SEEN
Bilirubin Urine: NEGATIVE
Glucose, UA: NEGATIVE mg/dL
Hgb urine dipstick: NEGATIVE
Ketones, ur: NEGATIVE mg/dL
Nitrite: NEGATIVE
Protein, ur: NEGATIVE mg/dL
Specific Gravity, Urine: 1.009 (ref 1.005–1.030)
pH: 5 (ref 5.0–8.0)

## 2023-07-13 LAB — CBC
HCT: 33.4 % — ABNORMAL LOW (ref 36.0–46.0)
Hemoglobin: 10.8 g/dL — ABNORMAL LOW (ref 12.0–15.0)
MCH: 31.4 pg (ref 26.0–34.0)
MCHC: 32.3 g/dL (ref 30.0–36.0)
MCV: 97.1 fL (ref 80.0–100.0)
Platelets: 349 10*3/uL (ref 150–400)
RBC: 3.44 MIL/uL — ABNORMAL LOW (ref 3.87–5.11)
RDW: 12.6 % (ref 11.5–15.5)
WBC: 7.4 10*3/uL (ref 4.0–10.5)
nRBC: 0 % (ref 0.0–0.2)

## 2023-07-13 LAB — COMPREHENSIVE METABOLIC PANEL WITH GFR
ALT: 14 U/L (ref 0–44)
AST: 11 U/L — ABNORMAL LOW (ref 15–41)
Albumin: 3.8 g/dL (ref 3.5–5.0)
Alkaline Phosphatase: 54 U/L (ref 38–126)
Anion gap: 8 (ref 5–15)
BUN: 22 mg/dL (ref 8–23)
CO2: 23 mmol/L (ref 22–32)
Calcium: 9.1 mg/dL (ref 8.9–10.3)
Chloride: 107 mmol/L (ref 98–111)
Creatinine, Ser: 1.24 mg/dL — ABNORMAL HIGH (ref 0.44–1.00)
GFR, Estimated: 46 mL/min — ABNORMAL LOW (ref >60)
Glucose, Bld: 137 mg/dL — ABNORMAL HIGH (ref 70–99)
Potassium: 4.9 mmol/L (ref 3.5–5.1)
Sodium: 138 mmol/L (ref 135–145)
Total Bilirubin: 0.6 mg/dL (ref 0.0–1.2)
Total Protein: 7.2 g/dL (ref 6.5–8.1)

## 2023-07-13 LAB — PROTIME-INR
INR: 1.3 — ABNORMAL HIGH (ref 0.8–1.2)
Prothrombin Time: 16.5 s — ABNORMAL HIGH (ref 11.4–15.2)

## 2023-07-13 LAB — APTT: aPTT: 33 s (ref 24–36)

## 2023-07-13 MED ORDER — NITROGLYCERIN 0.4 MG SL SUBL: 0.4000 mg | SUBLINGUAL_TABLET | SUBLINGUAL | Status: DC | PRN

## 2023-07-13 MED ORDER — ACETAMINOPHEN 325 MG PO TABS
650.0000 mg | ORAL_TABLET | Freq: Four times a day (QID) | ORAL | Status: DC | PRN
Start: 2023-07-13 — End: 2023-07-23
  Administered 2023-07-14 – 2023-07-17 (×5): 650 mg via ORAL
  Filled 2023-07-13 (×5): qty 2

## 2023-07-13 MED ORDER — HEPARIN (PORCINE) 25000 UT/250ML-% IV SOLN
1000.0000 [IU]/h | INTRAVENOUS | Status: AC
  Administered 2023-07-13: 1100 [IU]/h via INTRAVENOUS
  Administered 2023-07-14: 1000 [IU]/h via INTRAVENOUS
  Filled 2023-07-13 (×2): qty 250

## 2023-07-13 MED ORDER — ACETAMINOPHEN 650 MG RE SUPP: 650.0000 mg | Freq: Four times a day (QID) | RECTAL | Status: DC | PRN

## 2023-07-13 MED ORDER — ROSUVASTATIN CALCIUM 10 MG PO TABS
15.0000 mg | ORAL_TABLET | Freq: Every day | ORAL | Status: DC
  Administered 2023-07-14 – 2023-07-23 (×9): 15 mg via ORAL
  Filled 2023-07-13 (×9): qty 2

## 2023-07-13 MED ORDER — SERTRALINE HCL 50 MG PO TABS
100.0000 mg | ORAL_TABLET | Freq: Every day | ORAL | Status: DC
  Administered 2023-07-14 – 2023-07-23 (×9): 100 mg via ORAL
  Filled 2023-07-13 (×9): qty 2

## 2023-07-13 MED ORDER — CLONAZEPAM 0.5 MG PO TABS: 0.5000 mg | ORAL_TABLET | ORAL | Status: DC

## 2023-07-13 MED ORDER — CLONAZEPAM 0.5 MG PO TABS
0.5000 mg | ORAL_TABLET | Freq: Two times a day (BID) | ORAL | Status: DC | PRN
  Administered 2023-07-13 – 2023-07-22 (×8): 0.5 mg via ORAL
  Filled 2023-07-13 (×10): qty 1

## 2023-07-13 MED ORDER — CYCLOBENZAPRINE HCL 10 MG PO TABS
5.0000 mg | ORAL_TABLET | Freq: Three times a day (TID) | ORAL | Status: DC | PRN
  Administered 2023-07-13 – 2023-07-19 (×11): 5 mg via ORAL
  Filled 2023-07-13 (×11): qty 1

## 2023-07-13 MED ORDER — ALBUTEROL SULFATE (2.5 MG/3ML) 0.083% IN NEBU
2.5000 mg | INHALATION_SOLUTION | Freq: Four times a day (QID) | RESPIRATORY_TRACT | Status: DC | PRN
  Administered 2023-07-15: 2.5 mg via RESPIRATORY_TRACT
  Filled 2023-07-13: qty 3

## 2023-07-13 MED ORDER — OXYCODONE HCL 5 MG PO TABS
5.0000 mg | ORAL_TABLET | ORAL | Status: DC | PRN
  Administered 2023-07-14 – 2023-07-15 (×4): 5 mg via ORAL
  Filled 2023-07-13 (×6): qty 1

## 2023-07-13 MED ORDER — HEPARIN (PORCINE) 25000 UT/250ML-% IV SOLN: 1100.0000 [IU]/h | INTRAVENOUS | Status: DC

## 2023-07-13 MED ORDER — ALBUTEROL SULFATE HFA 108 (90 BASE) MCG/ACT IN AERS: 2.0000 | INHALATION_SPRAY | Freq: Four times a day (QID) | RESPIRATORY_TRACT | Status: DC | PRN

## 2023-07-13 MED ORDER — AMIODARONE HCL 200 MG PO TABS
200.0000 mg | ORAL_TABLET | Freq: Two times a day (BID) | ORAL | Status: DC
  Administered 2023-07-13: 200 mg via ORAL
  Filled 2023-07-13: qty 1

## 2023-07-13 MED ORDER — LISINOPRIL 10 MG PO TABS
10.0000 mg | ORAL_TABLET | Freq: Every day | ORAL | Status: DC
  Administered 2023-07-14 – 2023-07-18 (×4): 10 mg via ORAL
  Filled 2023-07-13 (×4): qty 1

## 2023-07-13 MED ORDER — AMIODARONE HCL 200 MG PO TABS: 200.0000 mg | ORAL_TABLET | Freq: Every day | ORAL | Status: DC

## 2023-07-13 MED ORDER — FLUTICASONE FUROATE-VILANTEROL 200-25 MCG/ACT IN AEPB
1.0000 | INHALATION_SPRAY | Freq: Every day | RESPIRATORY_TRACT | Status: DC
  Administered 2023-07-14 – 2023-07-23 (×10): 1 via RESPIRATORY_TRACT
  Filled 2023-07-13 (×2): qty 28

## 2023-07-13 NOTE — Assessment & Plan Note (Signed)
 Continue inhalers

## 2023-07-13 NOTE — Assessment & Plan Note (Signed)
 BMI 29.21

## 2023-07-13 NOTE — Assessment & Plan Note (Signed)
 Continue Klonopin  and Zoloft .

## 2023-07-13 NOTE — Assessment & Plan Note (Addendum)
 Patient on amiodarone  on decrease dose to 200 mg nightly. Eliquis  switched over to heparin  drip on evening of 6/18.  Heparin  drip stopped after midnight on 6/21.  Patient will go on DVT prophylactics Lovenox.  Stroke risk higher being off blood thinner.

## 2023-07-13 NOTE — Assessment & Plan Note (Addendum)
 Neurosurgical team plans on taking him to the operating room within the next couple days.  Will get cardiology clearance since patient had recent elevation of troponin.  Good thing his cardiac catheterization was negative and echocardiogram showed a normal EF.

## 2023-07-13 NOTE — ED Provider Notes (Signed)
 [facility] Provider Note    Event Date/Time   First MD Initiated Contact with Patient 07/13/23 1241     (approximate)   History   Fatigue   HPI  Whitney Stuart is a 73 year old female with history of hypertension, CKD, recent admission for NSTEMI presenting to the emergency department for evaluation of neurologic decline from neurosurgery office.  I reviewed her clinic note from earlier today.  There, she presented with numbness and tingling in her hands moving of her forearms thought to be myelopathic with rapidly progressive symptoms.  In the setting of this, it was recommended that she present to the ER for admission, medical clearance, anticipated surgical intervention.  Patient denies recent falls, chest pain, shortness of breath, other complaints.  Additionally reviewed her discharge summary from 06/30/2023.  At that time patient presented with NSTEMI was thought to be related to demand mismatch in the setting of A-fib with RVR, normal cardiac cath at that time.     Physical Exam   Triage Vital Signs: ED Triage Vitals  Encounter Vitals Group     BP 07/13/23 1142 (!) 151/67     Girls Systolic BP Percentile --      Girls Diastolic BP Percentile --      Boys Systolic BP Percentile --      Boys Diastolic BP Percentile --      Pulse Rate 07/13/23 1142 62     Resp 07/13/23 1142 20     Temp 07/13/23 1142 98.6 F (37 C)     Temp Source 07/13/23 1142 Oral     SpO2 07/13/23 1142 100 %     Weight 07/13/23 1147 181 lb (82.1 kg)     Height 07/13/23 1147 5' 6 (1.676 m)     Head Circumference --      Peak Flow --      Pain Score 07/13/23 1147 0     Pain Loc --      Pain Education --      Exclude from Growth Chart --     Most recent vital signs: Vitals:   07/13/23 1142  BP: (!) 151/67  Pulse: 62  Resp: 20  Temp: 98.6 F (37 C)  SpO2: 100%     General: Awake, interactive  CV:  Regular rate, good peripheral perfusion.  Resp:  Unlabored  respirations, lungs clear to auscultation Abd:  Nondistended.  Neuro:  Symmetric facial movement, fluid speech, mild generalized weakness of the bilateral upper and lower extremities   ED Results / Procedures / Treatments   Labs (all labs ordered are listed, but only abnormal results are displayed) Labs Reviewed  COMPREHENSIVE METABOLIC PANEL WITH GFR - Abnormal; Notable for the following components:      Result Value   Glucose, Bld 137 (*)    Creatinine, Ser 1.24 (*)    AST 11 (*)    GFR, Estimated 46 (*)    All other components within normal limits  CBC - Abnormal; Notable for the following components:   RBC 3.44 (*)    Hemoglobin 10.8 (*)    HCT 33.4 (*)    All other components within normal limits  URINALYSIS, ROUTINE W REFLEX MICROSCOPIC - Abnormal; Notable for the following components:   Color, Urine STRAW (*)    APPearance CLEAR (*)    Leukocytes,Ua TRACE (*)    All other components within normal limits  CBG MONITORING, ED     EKG EKG independently reviewed and interpreted  by myself demonstrates:  EKG demonstrates normal sinus rhythm rate of 63, PR 130, QRS 108, QTc 474, no acute ST changes  RADIOLOGY Imaging independently reviewed and interpreted by myself demonstrates:  CT cervical spine and x-rays pending  Formal Radiology Read:  No results found.  PROCEDURES:  Critical Care performed: No  Procedures   MEDICATIONS ORDERED IN ED: Medications - No data to display   IMPRESSION / MDM / ASSESSMENT AND PLAN / ED COURSE  I reviewed the triage vital signs and the nursing notes.  Differential diagnosis includes, but is not limited to, cervical myelopathy, anemia, electrolyte abnormality  Patient's presentation is most consistent with acute presentation with potential threat to life or bodily function.  73 year old female presenting with progressive myelopathy, sent in from neurosurgery office.  Case was discussed with NP Bartley Lightning and Dr. Felipe Horton with  neurosurgery team.  They recommend CT C-spine, flexion-extension x-rays which have been ordered.  They do request medicine admission for medical clearance in the setting of recent NSTEMI, do not anticipate surgery tomorrow, patient does not need to be kept NPO.  She does not need to be in a cervical collar.  Will reach out to hospitalist team.  Clinical Course as of 07/13/23 1404  Wed Jul 13, 2023  1404 Case discussed with hospitalist team.  They will evaluate for anticipated admission. [NR]    Clinical Course User Index [NR] [name], MD     FINAL CLINICAL IMPRESSION(S) / ED DIAGNOSES   Final diagnoses:  Cervical myelopathy (HCC)     Rx / DC Orders   ED Discharge Orders     None        Note:  This document was prepared using Dragon voice recognition software and may include unintentional dictation errors.   [name], MD 07/13/23 [phone]

## 2023-07-13 NOTE — Assessment & Plan Note (Signed)
 Continue lisinopril

## 2023-07-13 NOTE — Assessment & Plan Note (Signed)
 Continue Crestor

## 2023-07-13 NOTE — ED Triage Notes (Signed)
 Sent over from Dr. Felipe Horton for workup of generalized weakness and cervical compression fracture with possible surgical intervention.

## 2023-07-13 NOTE — Consult Note (Addendum)
 PHARMACY - ANTICOAGULATION CONSULT NOTE  Pharmacy Consult for Heparin  Indication: atrial fibrillation  Allergies  Allergen Reactions   Methylprednisolone Palpitations    Patient Measurements: Height: 5' 6 (167.6 cm) Weight: 82.1 kg (181 lb) IBW/kg (Calculated) : 59.3 HEPARIN  DW (KG): 76.5  Vital Signs: Temp: 98.6 F (37 C) (06/18 1142) Temp Source: Oral (06/18 1142) BP: 151/67 (06/18 1142) Pulse Rate: 62 (06/18 1142)  Labs: Recent Labs    07/13/23 1150  HGB 10.8*  HCT 33.4*  PLT 349  CREATININE 1.24*    Estimated Creatinine Clearance: 44.3 mL/min (A) (by C-G formula based on SCr of 1.24 mg/dL (H)).   Medical History: Past Medical History:  Diagnosis Date   Arthritis    Asthma    Asthma    Chest pain    a. 11/2019 Cor CTA: Calcium  score equals 0.  Normal coronary arteries.   CKD (chronic kidney disease)    Diastolic dysfunction    a. 11/2019 Echo: EF 60-65%.  Grade 1 diastolic dysfunction; b. 12/2021 Echo: EF 60-65%, GrI DD; c. 05/2022 Echo: EF 60-65%, no rwma. Nl RV fxn. RVSP 43.35mmHg. Sev dil LA. Mod MR.   GERD (gastroesophageal reflux disease)    History of cervical cancer    Hyperlipidemia    Hypertension    Mitral regurgitation    a. 12/22023 Echo: Mild-mod MR; b. 05/2022 Echo: Mod MR.   PAH (pulmonary artery hypertension) (HCC)    a. 11/2019 Echo: RVSP ; b .12/2021 Echo: RVSP 39.33mmHg; c. 05/2022 Echo: RVSP 43.25mmHg.   Persistent atrial fibrillation (HCC)    a. Dx 04/2022-->Eliquis  (CHA2DS2VASc = 3); b. 06/2022 Zio: predominantly sinus rhythm at a rate of 66 bpm (range 44-154), 2 brief runs of SVT (fastest 154 bpm x 20 beats) and otherwise rare PACs and PVCs.   Sleep-disordered breathing     Medications:  Apixaban  5mg  twice daily. Last dose: 6/18 in the AM  Assessment: 73 year old female with history of hypertension, CKD, atrial fibrillation, CKD and recent admission for NSTEMI presenting to the emergency department for evaluation of neurologic  decline from neurosurgery office. Patient takes apixaban  for atrial fibrillation PTA. Neurosurgical team plans to take patient to operating room within the next couple of days. Pharmacy has been consulted to initiate and dose continuous heparin  infusion  Baseline labs: aPTT pending, INR pending, Hgb 10.8, Plts 349  Goal of Therapy:  Heparin  level 0.3-0.7 units/ml aPTT 66-102 seconds Monitor platelets by anticoagulation protocol: Yes   Plan:  - Given recent administration of apixaban  this morning, will avoid initial bolus, but will use nomogram thereafter - Start heparin  infusion at 1100 units/hr - Check aPTT and HL in 8 hours with morning labs - Continue to dose via aPTT levels until both correlate, then will transition to HL dosing - Continue to monitor H&H and platelets  Jaelyn Cloninger A Yecheskel Kurek 07/13/2023,2:51 PM

## 2023-07-13 NOTE — Consult Note (Incomplete)
 Consulting Department:  Transferred from clinic  Primary Physician:  Mazie Speed, MD  Chief Complaint: Progressive cervical myelopathy  History of Present Illness: 07/13/2023 Whitney Stuart is a 73 y.o. female who presents with the chief complaint of progressive cervical myelopathy.  She has been followed in our clinic and has showed an acute progressive decline in her ability to ambulate, she has had difficulty with utilizing her upper extremities and lower extremities.  She is went from utilizing and being independent to needing a 2 wheeled walker and then further on needing a 4 wheeled walker, and worse now needing a ambulatory belt to help with getting around.  At this point she is no longer ambulatory and is currently in a wheelchair.  The symptoms are causing a significant impact on the patient's life.   Review of Systems:  A 10 point review of systems is negative, except for the pertinent positives and negatives detailed in the HPI.  Past Medical History: Past Medical History:  Diagnosis Date   Arthritis    Asthma    Asthma    Chest pain    a. 11/2019 Cor CTA: Calcium  score equals 0.  Normal coronary arteries.   CKD (chronic kidney disease)    Diastolic dysfunction    a. 11/2019 Echo: EF 60-65%.  Grade 1 diastolic dysfunction; b. 12/2021 Echo: EF 60-65%, GrI DD; c. 05/2022 Echo: EF 60-65%, no rwma. Nl RV fxn. RVSP 43.59mmHg. Sev dil LA. Mod MR.   GERD (gastroesophageal reflux disease)    History of cervical cancer    Hyperlipidemia    Hypertension    Mitral regurgitation    a. 12/22023 Echo: Mild-mod MR; b. 05/2022 Echo: Mod MR.   PAH (pulmonary artery hypertension) (HCC)    a. 11/2019 Echo: RVSP ; b .12/2021 Echo: RVSP 39.65mmHg; c. 05/2022 Echo: RVSP 43.16mmHg.   Persistent atrial fibrillation (HCC)    a. Dx 04/2022-->Eliquis  (CHA2DS2VASc = 3); b. 06/2022 Zio: predominantly sinus rhythm at a rate of 66 bpm (range 44-154), 2 brief runs of SVT (fastest 154 bpm x 20  beats) and otherwise rare PACs and PVCs.   Sleep-disordered breathing     Past Surgical History: Past Surgical History:  Procedure Laterality Date   ABDOMINAL HYSTERECTOMY  1999   total   HAND SURGERY Bilateral 2003   carpal tunnel   LEFT HEART CATH AND CORONARY ANGIOGRAPHY N/A 06/30/2023   Procedure: LEFT HEART CATH AND CORONARY ANGIOGRAPHY;  Surgeon: Wenona Hamilton, MD;  Location: [facility] INVASIVE CV LAB;  Service: Cardiovascular;  Laterality: N/A;   LIPOMA EXCISION      Allergies: Allergies as of 07/13/2023 - Review Complete 07/13/2023  Allergen Reaction Noted   Methylprednisolone Palpitations 12/29/2022    Medications:  Current Facility-Administered Medications:    acetaminophen  (TYLENOL ) tablet 650 mg, 650 mg, Oral, Q6H PRN **OR** acetaminophen  (TYLENOL ) suppository 650 mg, 650 mg, Rectal, Q6H PRN, Clelia Current, Richard, MD   albuterol  (PROVENTIL ) (2.5 MG/3ML) 0.083% nebulizer solution 2.5 mg, 2.5 mg, Nebulization, Q6H PRN, Wieting, Richard, MD   amiodarone  (PACERONE ) tablet 200 mg, 200 mg, Oral, BID **FOLLOWED BY** [START ON 07/14/2023] amiodarone  (PACERONE ) tablet 200 mg, 200 mg, Oral, Daily, Wieting, Richard, MD   clonazePAM  (KLONOPIN ) tablet 0.5 mg, 0.5 mg, Oral, BID PRN, Clelia Current, Richard, MD, 0.5 mg at 07/13/23 1602   cyclobenzaprine  (FLEXERIL ) tablet 5 mg, 5 mg, Oral, TID PRN, Verla Glaze, MD   Cecily Cohen ON 07/14/2023] fluticasone  furoate-vilanterol (BREO ELLIPTA ) 200-25 MCG/ACT 1 puff, 1 puff, Inhalation, Daily, Wieting,  Richard, MD   heparin  ADULT infusion 100 units/mL (25000 units/250mL), 1,100 Units/hr, Intravenous, Continuous, Nazari, Walid A, RPH   [START ON 07/14/2023] lisinopril  (ZESTRIL ) tablet 10 mg, 10 mg, Oral, Daily, Wieting, Richard, MD   nitroGLYCERIN  (NITROSTAT ) SL tablet 0.4 mg, 0.4 mg, Sublingual, Q5 min PRN, Wieting, Richard, MD   oxyCODONE  (Oxy IR/ROXICODONE ) immediate release tablet 5 mg, 5 mg, Oral, Q4H PRN, Verla Glaze, MD   Cecily Cohen ON 07/14/2023]  rosuvastatin  (CRESTOR ) tablet 15 mg, 15 mg, Oral, Daily, Wieting, Richard, MD   Cecily Cohen ON 07/14/2023] sertraline  (ZOLOFT ) tablet 100 mg, 100 mg, Oral, Daily, Wieting, Richard, MD   Social History: Social History   Tobacco Use   Smoking status: Former    Current packs/day: 0.00    Average packs/day: 0.5 packs/day for 6.0 years (3.0 ttl pk-yrs)    Types: Cigarettes    Start date: 01/25/1991    Quit date: 01/24/1997    Years since quitting: 26.4   Smokeless tobacco: Never  Vaping Use   Vaping status: Never Used  Substance Use Topics   Alcohol use: No   Drug use: No    Family Medical History: Family History  Problem Relation Age of Onset   Heart disease Mother    Stroke Mother    Congestive Heart Failure Mother    Hypertension Mother    Hypertension Father    Kidney disease Father    Heart murmur Sister    Hypertension Sister    Ovarian cancer Sister        ovarian   Hypertension Sister    Hypertension Sister    Lung cancer Brother    Hypertension Brother    Hypertension Brother    Sleep apnea Brother    Hypertension Brother    Heart disease Brother    Hypertension Brother    Hypertension Brother    Hypertension Brother    Hypertension Brother    Hypertension Brother    Mental illness Maternal Aunt    Congestive Heart Failure Daughter    Colon cancer Neg Hx    Breast cancer Neg Hx     Physical Examination: Vitals:   07/13/23 1142 07/13/23 1547  BP: (!) 151/67 125/65  Pulse: 62 62  Resp: 20   Temp: 98.6 F (37 C) 98.5 F (36.9 C)  SpO2: 100% 100%     General: Patient is well developed, well nourished, calm, collected, and in no apparent distress.  NEUROLOGICAL:  General: In no acute distress.   Awake, alert, oriented to person, place, and time.  Pupils equal round and reactive to light.  Facial tone is symmetric.  Tongue protrusion is midline.  There is no pronator drift.  ROM of spine: ***full.  Palpation of spine: ***nontender.     Strength:  Side Biceps Triceps Deltoid Interossei Grip Wrist Ext. Wrist Flex.  R 5 4+ 5 4+ 4 5 5   L 4- 4- 4 4 4  4- 4-    Side Iliopsoas Quads Hamstring PF DF EHL  R 3 4 5 5 3 3   L 3 4 5 5 3 3     She has positive Hoffmann signs bilaterally.  Left worse than right.  She is hyperreflexic with spreading at the brachial radialis and biceps.  She has crossed adductor reflexes.  Negative jaw jerk reflex.  Wheelchari bound   Imaging: DG Cervical Spine With Flex & Extend Result Date: 07/13/2023 CLINICAL DATA:  Neck pain. Clinical concern for cervical spine compression fracture. EXAM: CERVICAL SPINE COMPLETE WITH FLEXION  AND EXTENSION VIEWS COMPARISON:  Cervical spine MRI dated 07/09/2023. Cervical spine radiographs dated 05/24/2023. FINDINGS: Previously described multilevel degenerative changes are stable. No interval compression fractures. Previously demonstrated grade 1 anterolisthesis at the C3-4 and C4-5 levels increases with flexion and improves with extension with no residual anterolisthesis at the C3-4 level with extension. Normal-appearing prevertebral soft tissues. Right carotid artery atheromatous calcifications. IMPRESSION: 1. No interval compression fractures. 2. Grade 1 anterolisthesis at the C3-4 and C4-5 levels increases with flexion and improves with extension. 3. Stable multilevel degenerative changes. Electronically Signed   By: Catherin Closs M.D.   On: 07/13/2023 15:24    IMPRESSION: 1. Advanced cervical spine disc and posterior element degeneration superimposed on multilevel upper cervical spondylolisthesis. Subsequent multifactorial moderate and severe spinal stenosis with Degenerative Cord Compression C3-C4 through C5-C6. Subtle evidence of associated Spinal Cord Myelomalacia at those levels.   2. Mild cervical spinal stenosis elsewhere. Associated up to severe degenerative neural foraminal stenosis at the left C3, bilateral C4, right C5, bilateral C6, and left C7 nerve  levels.   Electronically Signed: By: Marlise Simpers M.D. On: 07/13/2023 09:30  I have personally reviewed the images and agree with the above interpretation.  Labs:    Latest Ref Rng & Units 07/13/2023   11:50 AM 06/30/2023    5:12 AM 06/29/2023    7:23 AM  CBC  WBC 4.0 - 10.5 K/uL 7.4  8.7  6.8   Hemoglobin 12.0 - 15.0 g/dL 16.1  09.6  04.5   Hematocrit 36.0 - 46.0 % 33.4  33.1  33.3   Platelets 150 - 400 K/uL 349  253  352       Latest Ref Rng & Units 07/13/2023   11:50 AM 06/30/2023    5:12 AM 06/29/2023    7:23 AM  BMP  Glucose 70 - 99 mg/dL 409  90  811   BUN 8 - 23 mg/dL 22  18  20    Creatinine 0.44 - 1.00 mg/dL 9.14  7.82  9.56   Sodium 135 - 145 mmol/L 138  140  137   Potassium 3.5 - 5.1 mmol/L 4.9  4.1  4.0   Chloride 98 - 111 mmol/L 107  114  108   CO2 22 - 32 mmol/L 23  18  22    Calcium  8.9 - 10.3 mg/dL 9.1  8.6  9.2     INR  1.3* (06/18 1647)   Assessment and Plan: Ms. Bieda is a pleasant 73 y.o. female with progressive cervical myelopathy. She was progressiviely losing function in her upper and lower extremiteis however this has accelarated rapdily over the past few weeks. She's no longer ambulatory and reuqires assisntat to get up   I have discussed the condition with the patient, including showing the radiographs and discussing treatment options in layman's terms.  The patient may benefit from conservative management.  Thus, I have recommended the following: ***.  I will see the patient back in a few weeks to gauge progress.     Carroll Clamp, MD/MSCR Dept. of Neurosurgery

## 2023-07-13 NOTE — H&P (Addendum)
 History and Physical    Patient: Whitney Stuart FMW:[id number] DOB: [birth year] DOA: 07/13/2023 DOS: the patient was seen and examined on 07/13/2023 PCP: Myrla Jon HERO, MD  Patient coming from: neurosurgical office (home before that)  Chief Complaint:  Chief Complaint  Patient presents with   Fatigue   HPI: Whitney Stuart is a 73 y.o. female with medical history significant of anxiety, atrial fibrillation, hypertension, hyperlipidemia, chronic kidney disease, asthma.  She has been having problems with her legs and arms going on for 1-1/2 to 2 months but really declined over the last 2 weeks.  Both arms have been numb and tingling.  Her legs are jumping and has right leg pain.  On 6/14 she had MRI of the entire spine.  MRI of the cervical spine showing advanced cervical spine disc and posterior element degeneration on multilevel upper cervical spondylolisthesis with severe spinal stenosis with degenerative cord compression at C3-C4 through C5-C6 with myelomalacia.  MRI of the thoracic spine showed spinal stenosis at T6-T7 with mild ventral cord mass effect.  MRI of the lumbar spine showed advanced chronic lumbar spine degeneration with severe chronic disc and posterior element degeneration at L4-L5.  Sent in from neurosurgery office for likely surgery in the next day or so.  Of note, the patient did have a recent hospitalization where her heart enzyme troponin was pretty elevated.  Cardiac catheterization was negative and echocardiogram showed normal EF. Review of Systems: Review of Systems  Constitutional:  Negative for chills, fever, malaise/fatigue and weight loss.  HENT:  Negative for hearing loss.   Eyes:  Negative for blurred vision.  Respiratory:  Positive for shortness of breath. Negative for cough.   Cardiovascular:  Negative for chest pain.  Gastrointestinal:  Negative for abdominal pain, constipation, diarrhea, heartburn, nausea and vomiting.  Genitourinary:  Negative for  dysuria.  Musculoskeletal:  Positive for myalgias.  Skin:  Negative for rash.  Neurological:  Negative for dizziness.  Endo/Heme/Allergies:  Does not bruise/bleed easily.  Psychiatric/Behavioral:  The patient is nervous/anxious.     Past Medical History:  Diagnosis Date   Arthritis    Asthma    Asthma    Chest pain    a. 11/2019 Cor CTA: Calcium  score equals 0.  Normal coronary arteries.   CKD (chronic kidney disease)    Diastolic dysfunction    a. 11/2019 Echo: EF 60-65%.  Grade 1 diastolic dysfunction; b. 12/2021 Echo: EF 60-65%, GrI DD; c. 05/2022 Echo: EF 60-65%, no rwma. Nl RV fxn. RVSP 43.35mmHg. Sev dil LA. Mod MR.   GERD (gastroesophageal reflux disease)    History of cervical cancer    Hyperlipidemia    Hypertension    Mitral regurgitation    a. 12/22023 Echo: Mild-mod MR; b. 05/2022 Echo: Mod MR.   PAH (pulmonary artery hypertension) (HCC)    a. 11/2019 Echo: RVSP ; b .12/2021 Echo: RVSP 39.39mmHg; c. 05/2022 Echo: RVSP 43.3mmHg.   Persistent atrial fibrillation (HCC)    a. Dx 04/2022-->Eliquis  (CHA2DS2VASc = 3); b. 06/2022 Zio: predominantly sinus rhythm at a rate of 66 bpm (range 44-154), 2 brief runs of SVT (fastest 154 bpm x 20 beats) and otherwise rare PACs and PVCs.   Sleep-disordered breathing    Past Surgical History:  Procedure Laterality Date   ABDOMINAL HYSTERECTOMY  1999   total   HAND SURGERY Bilateral 2003   carpal tunnel   LEFT HEART CATH AND CORONARY ANGIOGRAPHY N/A 06/30/2023   Procedure: LEFT HEART CATH AND CORONARY ANGIOGRAPHY;  Surgeon: Darron Deatrice LABOR, MD;  Location: [facility];  Service: Cardiovascular;  Laterality: N/A;   LIPOMA EXCISION     Social History:  reports that she quit smoking about 26 years ago. Her smoking use included cigarettes. She started smoking about 32 years ago. She has a 3 pack-year smoking history. She has never used smokeless tobacco. She reports that she does not drink alcohol and does not use  drugs.  Allergies  Allergen Reactions   Methylprednisolone  Palpitations    Family History  Problem Relation Age of Onset   Heart disease Mother    Stroke Mother    Congestive Heart Failure Mother    Hypertension Mother    Hypertension Father    Kidney disease Father    Heart murmur Sister    Hypertension Sister    Ovarian cancer Sister        ovarian   Hypertension Sister    Hypertension Sister    Lung cancer Brother    Hypertension Brother    Hypertension Brother    Sleep apnea Brother    Hypertension Brother    Heart disease Brother    Hypertension Brother    Hypertension Brother    Hypertension Brother    Hypertension Brother    Hypertension Brother    Mental illness Maternal Aunt    Congestive Heart Failure Daughter    Colon cancer Neg Hx    Breast cancer Neg Hx     Prior to Admission medications   Medication Sig Start Date End Date Taking? Authorizing Provider  albuterol  (VENTOLIN  HFA) 108 (90 Base) MCG/ACT inhaler Inhale 2 puffs into the lungs every 6 (six) hours as needed for wheezing or shortness of breath. 06/09/23   Bacigalupo, Jon HERO, MD  amiodarone  (PACERONE ) 200 MG tablet Take 2 tablets (400 mg total) by mouth 2 (two) times daily for 7 days, THEN 1 tablet (200 mg total) 2 (two) times daily for 7 days, THEN 1 tablet (200 mg total) daily. 06/30/23 08/13/23  Maree Hue, MD  apixaban  (ELIQUIS ) 5 MG TABS tablet Take 1 tablet (5 mg total) by mouth 2 (two) times daily. 07/01/23   Maree Hue, MD  clonazePAM  (KLONOPIN ) 0.5 MG tablet Take 0.5-1 tablets (0.25-0.5 mg total) by mouth 2 (two) times daily as needed for anxiety. 04/07/23   Bacigalupo, Angela M, MD  cyclobenzaprine  (FLEXERIL ) 5 MG tablet TAKE 1 TABLET BY MOUTH EVERYDAY AT BEDTIME 07/04/23   Bacigalupo, Jon HERO, MD  fluticasone  furoate-vilanterol (BREO ELLIPTA ) 200-25 MCG/ACT AEPB Inhale 1 puff into the lungs daily. 04/05/23   Bacigalupo, Angela M, MD  lisinopril  (ZESTRIL ) 10 MG tablet Take 1 tablet (10 mg total)  by mouth daily. 04/07/23   Myrla Jon HERO, MD  Misc. Devices (ROLLATOR ULTRA-LIGHT) MISC Lightweight rollator for gait stability to prevent falls. 06/28/23   Hilma Hastings, PA-C  nitroGLYCERIN  (NITROSTAT ) 0.4 MG SL tablet Place 1 tablet (0.4 mg total) under the tongue every 5 (five) minutes as needed for chest pain. 06/30/23 07/30/23  Maree Hue, MD  rosuvastatin  (CRESTOR ) 5 MG tablet TAKE 1 TABLET (5 MG TOTAL) BY MOUTH DAILY. STOP ATORVASTATIN  04/05/23   Bacigalupo, Angela M, MD  sertraline  (ZOLOFT ) 100 MG tablet Take 1 tablet (100 mg total) by mouth daily. 07/11/23   Myrla Jon HERO, MD    Physical Exam: Vitals:   07/13/23 1142 07/13/23 1147  BP: (!) 151/67   Pulse: 62   Resp: 20   Temp: 98.6 F (37 C)   TempSrc:  Oral   SpO2: 100%   Weight:  82.1 kg  Height:  5' 6 (1.676 m)   Physical Exam HENT:     Head: Normocephalic.     Mouth/Throat:     Pharynx: No oropharyngeal exudate.   Eyes:     General: Lids are normal.     Conjunctiva/sclera: Conjunctivae normal.    Cardiovascular:     Rate and Rhythm: Normal rate and regular rhythm.     Heart sounds: Normal heart sounds, S1 normal and S2 normal.  Pulmonary:     Breath sounds: No decreased breath sounds, wheezing, rhonchi or rales.  Abdominal:     Palpations: Abdomen is soft.     Tenderness: There is no abdominal tenderness.   Musculoskeletal:     Right lower leg: No swelling.     Left lower leg: No swelling.   Skin:    General: Skin is warm.     Findings: No rash.   Neurological:     Mental Status: She is alert and oriented to person, place, and time.     Comments: Bilateral weakness 4 out of 5 bilateral upper and lower extremities.  Some incoordination of the right leg.    Data Reviewed: MRIs reviewed above EKG normal sinus rhythm 63 bpm Creatinine 1.24, glucose 137, hemoglobin 10.8, white blood count 7.4, platelet count 349  Assessment and Plan: * Cervical myelopathy [facility]) Neurosurgical team plans on taking  her to the operating room within the next couple days.  Will get cardiology clearance since patient had recent elevation of troponin.  Good thing his cardiac catheterization was negative and echocardiogram showed a normal EF.  Paroxysmal atrial fibrillation (HCC) Patient on amiodarone .  Will switch Eliquis  over to heparin  drip prior to surgery.  Anxiety Continue Klonopin  and Zoloft .  Essential (primary) hypertension Continue lisinopril   Hyperlipidemia, unspecified Continue Crestor   Asthma, chronic Continue inhalers  Overweight (BMI 25.0-29.9) BMI 29.21      Advance Care Planning:   Code Status: Full Code   Consults: Sent in by neurosurgical team.  Cardiology consultation.  Family Communication: Daughter at the bedside  Severity of Illness: The appropriate patient status for this patient is INPATIENT. Inpatient status is judged to be reasonable and necessary in order to provide the required intensity of service to ensure the patient's safety. The patient's presenting symptoms, physical exam findings, and initial radiographic and laboratory data in the context of their chronic comorbidities is felt to place them at high risk for further clinical deterioration. Furthermore, it is not anticipated that the patient will be medically stable for discharge from the hospital within 2 midnights of admission.   * I certify that at the point of admission it is my clinical judgment that the patient will require inpatient hospital care spanning beyond 2 midnights from the point of admission due to high intensity of service, high risk for further deterioration and high frequency of surveillance required.*  Author: Charlie Patterson, MD 07/13/2023 2:59 PM  For on call review [email or web address].

## 2023-07-14 DIAGNOSIS — I480 Paroxysmal atrial fibrillation: Secondary | ICD-10-CM | POA: Diagnosis not present

## 2023-07-14 DIAGNOSIS — R7989 Other specified abnormal findings of blood chemistry: Secondary | ICD-10-CM

## 2023-07-14 DIAGNOSIS — E041 Nontoxic single thyroid nodule: Secondary | ICD-10-CM | POA: Insufficient documentation

## 2023-07-14 DIAGNOSIS — Z01810 Encounter for preprocedural cardiovascular examination: Secondary | ICD-10-CM

## 2023-07-14 DIAGNOSIS — F419 Anxiety disorder, unspecified: Secondary | ICD-10-CM | POA: Diagnosis not present

## 2023-07-14 DIAGNOSIS — I10 Essential (primary) hypertension: Secondary | ICD-10-CM | POA: Diagnosis not present

## 2023-07-14 DIAGNOSIS — G959 Disease of spinal cord, unspecified: Secondary | ICD-10-CM | POA: Diagnosis not present

## 2023-07-14 DIAGNOSIS — E782 Mixed hyperlipidemia: Secondary | ICD-10-CM

## 2023-07-14 LAB — APTT
aPTT: 98 s — ABNORMAL HIGH (ref 24–36)
aPTT: 100 s — ABNORMAL HIGH (ref 24–36)
aPTT: 83 s — ABNORMAL HIGH (ref 24–36)

## 2023-07-14 LAB — HEPARIN LEVEL (UNFRACTIONATED): Heparin Unfractionated: >1.1 [IU]/mL — ABNORMAL HIGH (ref 0.30–0.70)

## 2023-07-14 MED ORDER — AMIODARONE HCL 200 MG PO TABS
200.0000 mg | ORAL_TABLET | Freq: Every day | ORAL | Status: DC
  Administered 2023-07-14 – 2023-07-22 (×9): 200 mg via ORAL
  Filled 2023-07-14 (×9): qty 1

## 2023-07-14 NOTE — Consult Note (Signed)
 Cardiology Consultation   Patient ID: Whitney Stuart MRN: [id number]; DOB: 04-25-1950  Admit date: 07/13/2023 Date of Consult: 07/14/2023  PCP:  Mazie Speed, MD   [facility] Providers Cardiologist:  Antionette Kirks, MD  Electrophysiologist:  Boyce Byes, MD       Patient Profile: Whitney Stuart is a 73 y.o. female with a hx of paroxysmal atrial fibrillation, moderate mitral regurgitation, hypertension, hyperlipidemia, pulmonary hypertension, palpitations, who is being seen 07/14/2023 for the evaluation of atrial fibrillation and elevated high-sensitivity troponin for preoperative cardiovascular exam at the request of Dr. Clelia Current.  History of Present Illness:   Whitney Stuart was previously evaluated for chest pain and dyspnea 10/2019.  Echocardiogram revealed normal LV function, G1 DD, mild to moderate MR, mild to moderate pulmonary hypertension.  Coronary CTA showed calcium  score of 0 with normal coronary arteries.  Home sleep study 06/2020 was normal.  She was evaluated in clinic 04/2022 with complaints of palpitations and tachycardia associated with dyspnea and was found to be in rapid atrial fibrillation at 140 bpm.  She was started on apixaban  5 mg twice daily and aspirin  was discontinued.  Metoprolol  25 mg twice daily was added to her daily regimen.  Repeat echocardiogram showed an LVEF of 60 to 65% with normal RV function, severely dilated left atrium, moderate MR.  An office follow-up 05/2022 she was noted to be in sinus rhythm but was bradycardic at 44 bpm.  Metoprolol  was subsequently reduced to 12.5 mg twice daily.  She was placed on a ZIO XT monitor which revealed predominantly sinus rhythm with a rate of 66 bpm, 2 brief runs of SVT, and otherwise rare PACs and PVCs.  She was seen in clinic 02/10/2023 reporting 1 episode of syncope in 12/2022.  This occurred after taking Flexeril  and standing up too quickly in the evening.  She presented to the [facility] emergency  department 6//2025 stating she woke at around 5 AM with symptoms of headache, shortness of breath, diaphoresis, chest tightness.  Heart rate was elevated in 135 bpm otherwise she had normal vital signs.  Labs were notable for an elevated creatinine 1.20 which was baseline.  Hemoglobin was 11.  Chest x-ray showed mild pulmonary vascular congestion.  EKG showed A-fib with RVR with a rate of 150 bpm.  Initial troponin was 29 acclimated to 82 then 1004.  She was started on IV heparin , given IV and oral metoprolol , started on IV amiodarone  load via infusion and subsequent conversion to sinus rhythm.  Cardiology was consulted for further evaluation of her atrial fibrillation.  With complaints of chest discomfort and elevated troponin she was scheduled for left heart catheterization about today.  Left heart catheterization revealed normal coronary arteries with normal LV systolic function, normal left ventricular end-diastolic pressure.  Recommendations for elevated troponins seem to be likely due to to supply demand ischemia.  Continue treatment for atrial fibrillation and apixaban  can be resumed following morning.  She was considered stable and discharged home on 06/30/2023.   She presented to the [facility] emergency department on 07/13/2023 for from her neurosurgical providers office.  She been having problems with her legs and arms ongoing for the past 1-1/2 to 2 months.  Decline over the last 2 weeks.  Both arms have been numb and tingling.  Her legs were jumping and she had right leg pain.  On 6/14 she had an MRI of the entire spine.  MRI of the cervical spine showing advanced cervical spine disc and posterior  element degeneration on multilevel upper cervical spondylosis with severe spinal stenosis and degenerative cord compression at C3-C4 through C5-C6.  MRI of the thoracic spine showed spinal stenosis at T6/T7 and mild ventral cord mass effect.  She was positive for shortness of breath fatigue and anxiousness.  Initial  vital signs reveal blood pressure 151/67, pulse 62, respiration of 20, temperature of 98.6.  EKG revealed sinus rhythm with a rate of 63.  Apixaban  was placed on hold she was transition to heparin  prior to continued workup for surgery.  Neurosurgery team had seen the patient in the office and recommended CT C-spine, flexion-extension x-rays which have been ordered.  She was recommended for medical admission and a clearance prior to surgery with recent NSTEMI.  Patient continues to remain chest pain-free has occasional shortness of breath and continues to have bilateral weakness with some incoordination of the right lower extremity.   Cardiology was consulted for preoperative cardiovascular examination.  Past Medical History:  Diagnosis Date   Arthritis    Asthma    Asthma    Chest pain    a. 11/2019 Cor CTA: Calcium  score equals 0.  Normal coronary arteries.   CKD (chronic kidney disease)    Diastolic dysfunction    a. 11/2019 Echo: EF 60-65%.  Grade 1 diastolic dysfunction; b. 12/2021 Echo: EF 60-65%, GrI DD; c. 05/2022 Echo: EF 60-65%, no rwma. Nl RV fxn. RVSP 43.62mmHg. Sev dil LA. Mod MR.   GERD (gastroesophageal reflux disease)    History of cervical cancer    Hyperlipidemia    Hypertension    Mitral regurgitation    a. 12/22023 Echo: Mild-mod MR; b. 05/2022 Echo: Mod MR.   PAH (pulmonary artery hypertension) (HCC)    a. 11/2019 Echo: RVSP ; b .12/2021 Echo: RVSP 39.32mmHg; c. 05/2022 Echo: RVSP 43.13mmHg.   Persistent atrial fibrillation (HCC)    a. Dx 04/2022-->Eliquis  (CHA2DS2VASc = 3); b. 06/2022 Zio: predominantly sinus rhythm at a rate of 66 bpm (range 44-154), 2 brief runs of SVT (fastest 154 bpm x 20 beats) and otherwise rare PACs and PVCs.   Sleep-disordered breathing     Past Surgical History:  Procedure Laterality Date   ABDOMINAL HYSTERECTOMY  1999   total   HAND SURGERY Bilateral 2003   carpal tunnel   LEFT HEART CATH AND CORONARY ANGIOGRAPHY N/A 06/30/2023    Procedure: LEFT HEART CATH AND CORONARY ANGIOGRAPHY;  Surgeon: Wenona Hamilton, MD;  Location: [facility] INVASIVE CV LAB;  Service: Cardiovascular;  Laterality: N/A;   LIPOMA EXCISION       Home Medications:  Prior to Admission medications   Medication Sig Start Date End Date Taking? Authorizing Provider  albuterol  (VENTOLIN  HFA) 108 (90 Base) MCG/ACT inhaler Inhale 2 puffs into the lungs every 6 (six) hours as needed for wheezing or shortness of breath. 06/09/23  Yes Bacigalupo, Stan Eans, MD  amiodarone  (PACERONE ) 200 MG tablet Take 2 tablets (400 mg total) by mouth 2 (two) times daily for 7 days, THEN 1 tablet (200 mg total) 2 (two) times daily for 7 days, THEN 1 tablet (200 mg total) daily. 06/30/23 08/13/23 Yes Brenna Cam, MD  apixaban  (ELIQUIS ) 5 MG TABS tablet Take 1 tablet (5 mg total) by mouth 2 (two) times daily. 07/01/23  Yes Brenna Cam, MD  clonazePAM  (KLONOPIN ) 0.5 MG tablet Take 0.5-1 tablets (0.25-0.5 mg total) by mouth 2 (two) times daily as needed for anxiety. 04/07/23  Yes Bacigalupo, Stan Eans, MD  cyclobenzaprine  (FLEXERIL ) 5 MG tablet TAKE  1 TABLET BY MOUTH EVERYDAY AT BEDTIME 07/04/23  Yes Bacigalupo, Stan Eans, MD  fluticasone  furoate-vilanterol (BREO ELLIPTA ) 200-25 MCG/ACT AEPB Inhale 1 puff into the lungs daily. 04/05/23  Yes Bacigalupo, Stan Eans, MD  lisinopril  (ZESTRIL ) 10 MG tablet Take 1 tablet (10 mg total) by mouth daily. 04/07/23  Yes Bacigalupo, Stan Eans, MD  nitroGLYCERIN  (NITROSTAT ) 0.4 MG SL tablet Place 1 tablet (0.4 mg total) under the tongue every 5 (five) minutes as needed for chest pain. 06/30/23 07/30/23 Yes Brenna Cam, MD  rosuvastatin  (CRESTOR ) 5 MG tablet TAKE 1 TABLET (5 MG TOTAL) BY MOUTH DAILY. STOP ATORVASTATIN  04/05/23  Yes Bacigalupo, Stan Eans, MD  sertraline  (ZOLOFT ) 100 MG tablet Take 1 tablet (100 mg total) by mouth daily. 07/11/23  Yes Bacigalupo, Angela M, MD  FARXIGA 10 MG TABS tablet Take 10 mg by mouth every morning. Patient not taking: Reported on 07/13/2023     [provider]  gabapentin  (NEURONTIN ) 100 MG capsule Take 100 mg by mouth daily. Patient not taking: Reported on 07/13/2023 07/13/23   [provider]  Misc. Devices (ROLLATOR ULTRA-LIGHT) MISC Lightweight rollator for gait stability to prevent falls. 06/28/23   Lucetta Russel, PA-C    Scheduled Meds:  amiodarone   200 mg Oral BID   Followed by   amiodarone   200 mg Oral Daily   fluticasone  furoate-vilanterol  1 puff Inhalation Daily   lisinopril   10 mg Oral Daily   rosuvastatin   15 mg Oral Daily   sertraline   100 mg Oral Daily   Continuous Infusions:  heparin  1,100 Units/hr (07/13/23 2151)   PRN Meds: acetaminophen  **OR** acetaminophen , albuterol , clonazePAM , cyclobenzaprine , nitroGLYCERIN , oxyCODONE   Allergies:    Allergies  Allergen Reactions   Methylprednisolone Palpitations    Social History:   Social History   Socioeconomic History   Marital status: Widowed    Spouse name: Natori Gudino   Number of children: 7   Years of education: Not on file   Highest education level: GED or equivalent  Occupational History   Occupation: Retired  Tobacco Use   Smoking status: Former    Current packs/day: 0.00    Average packs/day: 0.5 packs/day for 6.0 years (3.0 ttl pk-yrs)    Types: Cigarettes    Start date: 01/25/1991    Quit date: 01/24/1997    Years since quitting: 26.4   Smokeless tobacco: Never  Vaping Use   Vaping status: Never Used  Substance and Sexual Activity   Alcohol use: No   Drug use: No   Sexual activity: Yes    Birth control/protection: Surgical  Other Topics Concern   Not on file  Social History Narrative   Not on file   Social Drivers of Health   Financial Resource Strain: Low Risk  (12/28/2022)   Overall Financial Resource Strain (CARDIA)    Difficulty of Paying Living Expenses: Not hard at all  Food Insecurity: No Food Insecurity (07/13/2023)   Hunger Vital Sign    Worried About Running Out of Food in the Last Year: Never  true    Ran Out of Food in the Last Year: Never true  Transportation Needs: No Transportation Needs (07/13/2023)   PRAPARE - Administrator, Civil Service (Medical): No    Lack of Transportation (Non-Medical): No  Physical Activity: Insufficiently Active (12/28/2022)   Exercise Vital Sign    Days of Exercise per Week: 3 days    Minutes of Exercise per Session: 30 min  Stress: No Stress Concern  Present (12/28/2022)   [employer] of Occupational Health - Occupational Stress Questionnaire    Feeling of Stress : Not at all  Social Connections: Moderately Isolated (07/13/2023)   Social Connection and Isolation Panel    Frequency of Communication with Friends and Family: More than three times a week    Frequency of Social Gatherings with Friends and Family: More than three times a week    Attends Religious Services: More than 4 times per year    Active Member of [employer] or Organizations: No    Attends Banker Meetings: Never    Marital Status: Widowed  Intimate Partner Violence: Not At Risk (07/13/2023)   Humiliation, Afraid, Rape, and Kick questionnaire    Fear of Current or Ex-Partner: No    Emotionally Abused: No    Physically Abused: No    Sexually Abused: No    Family History:    Family History  Problem Relation Age of Onset   Heart disease Mother    Stroke Mother    Congestive Heart Failure Mother    Hypertension Mother    Hypertension Father    Kidney disease Father    Heart murmur Sister    Hypertension Sister    Ovarian cancer Sister        ovarian   Hypertension Sister    Hypertension Sister    Lung cancer Brother    Hypertension Brother    Hypertension Brother    Sleep apnea Brother    Hypertension Brother    Heart disease Brother    Hypertension Brother    Hypertension Brother    Hypertension Brother    Hypertension Brother    Hypertension Brother    Mental illness Maternal Aunt    Congestive Heart Failure Daughter    Colon cancer  Neg Hx    Breast cancer Neg Hx      ROS:  Please see the history of present illness.  Review of Systems  Constitutional:  Positive for malaise/fatigue.  Respiratory:  Positive for shortness of breath.   Musculoskeletal:  Positive for back pain.  Neurological:  Positive for tingling and weakness.    All other ROS reviewed and negative.     Physical Exam/Data: Vitals:   07/13/23 2010 07/13/23 2047 07/14/23 0421 07/14/23 0756  BP: (!) 134/58 (!) 140/58 (!) 106/44 (!) 103/43  Pulse: 62 62 (!) 51 (!) 46  Resp:  18 18 18   Temp: 99 F (37.2 C) 98.5 F (36.9 C) 97.6 F (36.4 C) 97.6 F (36.4 C)  TempSrc:   Oral   SpO2: 100% 99% 100% 99%  Weight:      Height:       No intake or output data in the 24 hours ending 07/14/23 0811    07/13/2023   11:47 AM 07/11/2023   10:09 AM 06/29/2023    8:38 PM  Last 3 Weights  Weight (lbs) 181 lb 181 lb 183 lb 11.2 oz  Weight (kg) 82.101 kg 82.101 kg 83.326 kg     Body mass index is 29.21 kg/m.  General:  Well nourished, well developed, in no acute distress HEENT: normal Neck: no JVD Vascular: No carotid bruits; Distal pulses 2+ bilaterally Cardiac:  normal S1, S2; RRR; no murmur  Lungs:  clear to auscultation bilaterally, no wheezing, rhonchi or rales  Abd: soft, nontender, no hepatomegaly  Ext: no edema Musculoskeletal:  No deformities, BUE and BLE strength normal and equal Skin: warm and dry  Neuro:  CNs 2-12  intact, no focal abnormalities noted Psych:  Normal affect   EKG:  The EKG was personally reviewed and demonstrates: Sinus rhythm rate of 63 with no acute ischemic changes noted Telemetry:  Telemetry was personally reviewed and demonstrates:  Not currently on telelmetry  Relevant CV Studies:  [facility] 06/30/2023   The left ventricular systolic function is normal.   LV end diastolic pressure is normal.   The left ventricular ejection fraction is 55-65% by visual estimate.   1.  Normal coronary arteries. 2.  Normal LV systolic  function normal left ventricular end-diastolic pressure.   Recommendations: Elevated troponin seems to be due to supply demand ischemia.  Continue treatment of atrial fibrillation. Eliquis   can be resumed tomorrow morning.    06/01/2022 Echo complete 1. Left ventricular ejection fraction, by estimation, is 60 to 65%. The  left ventricle has normal function. The left ventricle has no regional  wall motion abnormalities. There is mild asymmetric left ventricular  hypertrophy of the basal-septal segment.  Left ventricular diastolic parameters are indeterminate. The average left  ventricular global longitudinal strain is -10.2 %.   2. Right ventricular systolic function is normal. The right ventricular  size is normal. There is mildly elevated pulmonary artery systolic  pressure. The estimated right ventricular systolic pressure is 43.9 mmHg.   3. Left atrial size was severely dilated.   4. The mitral valve is normal in structure. Moderate mitral valve  regurgitation. No evidence of mitral stenosis.   5. The aortic valve is tricuspid. Aortic valve regurgitation is not  visualized. No aortic stenosis is present.   6. The inferior vena cava is normal in size with greater than 50%  respiratory variability, suggesting right atrial pressure of 3 mmHg.   Laboratory Data: High Sensitivity Troponin:   Recent Labs  Lab 06/29/23 0919 06/29/23 1213 06/29/23 1422 06/29/23 1847 06/29/23 2254  TROPONINIHS 282* 1,004* 1,626* 1,848* 1,798*     Chemistry Recent Labs  Lab 07/13/23 1150  NA 138  K 4.9  CL 107  CO2 23  GLUCOSE 137*  BUN 22  CREATININE 1.24*  CALCIUM  9.1  GFRNONAA 46*  ANIONGAP 8    Recent Labs  Lab 07/13/23 1150  PROT 7.2  ALBUMIN 3.8  AST 11*  ALT 14  ALKPHOS 54  BILITOT 0.6   Lipids No results for input(s): CHOL, TRIG, HDL, LABVLDL, LDLCALC, CHOLHDL in the last 168 hours.  Hematology Recent Labs  Lab 07/13/23 1150  WBC 7.4  RBC 3.44*  HGB 10.8*   HCT 33.4*  MCV 97.1  MCH 31.4  MCHC 32.3  RDW 12.6  PLT 349   Thyroid  No results for input(s): TSH, FREET4 in the last 168 hours.  BNPNo results for input(s): BNP, PROBNP in the last 168 hours.  DDimer No results for input(s): DDIMER in the last 168 hours.  Radiology/Studies:  CT Cervical Spine Wo Contrast Result Date: 07/13/2023 CLINICAL DATA:  Myelopathy, acute, cervical spine EXAM: CT CERVICAL SPINE WITHOUT CONTRAST TECHNIQUE: Multidetector CT imaging of the cervical spine was performed without intravenous contrast. Multiplanar CT image reconstructions were also generated. RADIATION DOSE REDUCTION: This exam was performed according to the departmental dose-optimization program which includes automated exposure control, adjustment of the mA and/or kV according to patient size and/or use of iterative reconstruction technique. COMPARISON:  MRI cervical spine 07/09/2023 FINDINGS: Alignment: Grade 1 anterolisthesis of C3 on C4 and C4 on C5. Skull base and vertebrae: Multilevel severe degenerative changes of the spine. Associated severe osseous neural foraminal  stenosis of the bilateral C4-C5 levels. No severe osseous central canal stenosis. Posterior disc osteophyte complex formation at the C4-C5 C5-C6 levels. No acute fracture. No aggressive appearing focal osseous lesion or focal pathologic process. Soft tissues and spinal canal: No prevertebral fluid or swelling. No visible canal hematoma. Upper chest: Unremarkable. Other: Enlarged right thyroid  gland with multiple hypodense nodules measuring up to 2 cm. Atherosclerotic plaque of the carotid arteries within the neck. IMPRESSION: 1. No acute displaced fracture or traumatic listhesis of the cervical spine. 2. Incidental right thyroid  nodules, measuring up to 2 cm. Recommend non-emergent thyroid  ultrasound. Reference: J Am Coll Radiol. 2015 Feb;12(2): 143-50. Electronically Signed   By: Morgane  Naveau M.D.   On: 07/13/2023 18:39   DG  Cervical Spine With Flex & Extend Result Date: 07/13/2023 CLINICAL DATA:  Neck pain. Clinical concern for cervical spine compression fracture. EXAM: CERVICAL SPINE COMPLETE WITH FLEXION AND EXTENSION VIEWS COMPARISON:  Cervical spine MRI dated 07/09/2023. Cervical spine radiographs dated 05/24/2023. FINDINGS: Previously described multilevel degenerative changes are stable. No interval compression fractures. Previously demonstrated grade 1 anterolisthesis at the C3-4 and C4-5 levels increases with flexion and improves with extension with no residual anterolisthesis at the C3-4 level with extension. Normal-appearing prevertebral soft tissues. Right carotid artery atheromatous calcifications. IMPRESSION: 1. No interval compression fractures. 2. Grade 1 anterolisthesis at the C3-4 and C4-5 levels increases with flexion and improves with extension. 3. Stable multilevel degenerative changes. Electronically Signed   By: Catherin Closs M.D.   On: 07/13/2023 15:24     Assessment and Plan: Preoperative cardiovascular examination with patient remains chest pain-free recently underwent left heart catheterization which revealed normal coronary arteries with no obstructive disease and an echocardiogram that was completed in May of this year revealing an LVEF of 60 to 65%, no RWMA.  According to ACC-AHA guidelines no further cardiovascular testing is needed she may proceed to surgery at acceptable risk.  Paroxysmal atrial fibrillation -Currently maintaining sinus rhythm -Continued on amiodarone  -Continue on heparin  infusion for CHA2DS2-VASc of at least 4 for stroke prophylaxis -Transition back to apixaban  5 mg twice daily when okay with surgery -Recommend placing on telemetry monitoring  Cervical myelopathy -Sent to the emergency department from neurosurgery's office -Pending neurosurgery -MRI findings listed above -Ongoing management per neurosurgery and IM  Primary hypertension -Blood pressure  103/43 -Continued on PTA lisinopril  10 mg daily metoprolol  on hold due to baseline bradycardia -Vital signs per unit protocol  Mixed hyperlipidemia -Last LDL -Continued on rosuvastatin  50 mg daily  Asthma -Continued on inhalers  Anxiety - Continued on sertraline  - Ongoing management per IM     Ms. Dambach's perioperative risk of a major cardiac event is 0.9% according to the Revised Cardiac Risk Index (RCRI).  Therefore, she is at low risk for perioperative complications.   Her functional capacity is fair at 4.64 METs according to the Duke Activity Status Index (DASI). Recommendations: According to ACC/AHA guidelines, no further cardiovascular testing needed.  The patient may proceed to surgery at acceptable risk.    Risk Assessment/Risk Scores:         CHA2DS2-VASc Score = 4   This indicates a 4.8% annual risk of stroke. The patient's score is based upon: CHF History: 0 HTN History: 1 Diabetes History: 0 Stroke History: 0 Vascular Disease History: 1 Age Score: 1 Gender Score: 1        For questions or updates, please contact [facility] Please consult [email or web address] for contact info under    Signed,  Karigan Cloninger, NP  07/14/2023 8:11 AM

## 2023-07-14 NOTE — Consult Note (Signed)
 PHARMACY - ANTICOAGULATION CONSULT NOTE  Pharmacy Consult for Heparin  Indication: atrial fibrillation  Allergies  Allergen Reactions   Methylprednisolone Palpitations    Patient Measurements: Height: 5' 6 (167.6 cm) Weight: 82.1 kg (181 lb) IBW/kg (Calculated) : 59.3 HEPARIN  DW (KG): 76.5  Vital Signs: Temp: 97.6 F (36.4 C) (06/19 0421) Temp Source: Oral (06/19 0421) BP: 106/44 (06/19 0421) Pulse Rate: 51 (06/19 0421)  Labs: Recent Labs    07/13/23 1150 07/13/23 1647 07/14/23 0600  HGB 10.8*  --   --   HCT 33.4*  --   --   PLT 349  --   --   APTT  --  33 98*  LABPROT  --  16.5*  --   INR  --  1.3*  --   HEPARINUNFRC  --   --  >1.10*  CREATININE 1.24*  --   --     Estimated Creatinine Clearance: 44.3 mL/min (A) (by C-G formula based on SCr of 1.24 mg/dL (H)).   Medical History: Past Medical History:  Diagnosis Date   Arthritis    Asthma    Asthma    Chest pain    a. 11/2019 Cor CTA: Calcium  score equals 0.  Normal coronary arteries.   CKD (chronic kidney disease)    Diastolic dysfunction    a. 11/2019 Echo: EF 60-65%.  Grade 1 diastolic dysfunction; b. 12/2021 Echo: EF 60-65%, GrI DD; c. 05/2022 Echo: EF 60-65%, no rwma. Nl RV fxn. RVSP 43.31mmHg. Sev dil LA. Mod MR.   GERD (gastroesophageal reflux disease)    History of cervical cancer    Hyperlipidemia    Hypertension    Mitral regurgitation    a. 12/22023 Echo: Mild-mod MR; b. 05/2022 Echo: Mod MR.   PAH (pulmonary artery hypertension) (HCC)    a. 11/2019 Echo: RVSP ; b .12/2021 Echo: RVSP 39.32mmHg; c. 05/2022 Echo: RVSP 43.73mmHg.   Persistent atrial fibrillation (HCC)    a. Dx 04/2022-->Eliquis  (CHA2DS2VASc = 3); b. 06/2022 Zio: predominantly sinus rhythm at a rate of 66 bpm (range 44-154), 2 brief runs of SVT (fastest 154 bpm x 20 beats) and otherwise rare PACs and PVCs.   Sleep-disordered breathing     Medications:  Apixaban  5mg  twice daily. Last dose: 6/18 in the  AM  Assessment: 73 year old female with history of hypertension, CKD, atrial fibrillation, CKD and recent admission for NSTEMI presenting to the emergency department for evaluation of neurologic decline from neurosurgery office. Patient takes apixaban  for atrial fibrillation PTA. Neurosurgical team plans to take patient to operating room within the next couple of days. Pharmacy has been consulted to initiate and dose continuous heparin  infusion  Baseline labs: aPTT pending, INR pending, Hgb 10.8, Plts 349  Goal of Therapy:  Heparin  level 0.3-0.7 units/ml aPTT 66-102 seconds Monitor platelets by anticoagulation protocol: Yes   Plan:  6/19 @ 0600:  aPTT = 98,  HL = > 1.10  - aPTT therapeutic X 1,  HL elevated from PTA Eliquis  - will continue pt on current rate and recheck aPTT in 8 hrs - recheck HL on 6/20 with AM labs  - Continue to dose via aPTT levels until both correlate, then will transition to HL dosing - Continue to monitor H&H and platelets  Elsia Lasota D 07/14/2023,6:43 AM

## 2023-07-14 NOTE — Progress Notes (Signed)
 PT Cancellation Note  Patient Details Name: Whitney Stuart MRN: [id number] DOB: July 11, 1950   Cancelled Treatment:  PT order received, chart reviewed. Per neurosurgery note, pt undergoing procedure today or tomorrow, waiting for new protocol. Will follow up when appropriate.   Alliya Marcon Romero-Perozo, SPT  07/14/2023, 9:19 AM

## 2023-07-14 NOTE — Consult Note (Signed)
 PHARMACY - ANTICOAGULATION CONSULT NOTE  Pharmacy Consult for Heparin  Indication: atrial fibrillation  Allergies  Allergen Reactions   Methylprednisolone Palpitations   Patient Measurements: Height: 5' 6 (167.6 cm) Weight: 82.1 kg (181 lb) IBW/kg (Calculated) : 59.3 HEPARIN  DW (KG): 76.5  Vital Signs: Temp: 97.6 F (36.4 C) (06/19 0756) Temp Source: Oral (06/19 0421) BP: 118/60 (06/19 0851) Pulse Rate: 46 (06/19 0756)  Labs: Recent Labs    07/13/23 1150 07/13/23 1647 07/14/23 0600  HGB 10.8*  --   --   HCT 33.4*  --   --   PLT 349  --   --   APTT  --  33 98*  LABPROT  --  16.5*  --   INR  --  1.3*  --   HEPARINUNFRC  --   --  >1.10*  CREATININE 1.24*  --   --    Estimated Creatinine Clearance: 44.3 mL/min (A) (by C-G formula based on SCr of 1.24 mg/dL (H)).  Medical History: Past Medical History:  Diagnosis Date   Arthritis    Asthma    Asthma    Chest pain    a. 11/2019 Cor CTA: Calcium  score equals 0.  Normal coronary arteries.   CKD (chronic kidney disease)    Diastolic dysfunction    a. 11/2019 Echo: EF 60-65%.  Grade 1 diastolic dysfunction; b. 12/2021 Echo: EF 60-65%, GrI DD; c. 05/2022 Echo: EF 60-65%, no rwma. Nl RV fxn. RVSP 43.75mmHg. Sev dil LA. Mod MR.   GERD (gastroesophageal reflux disease)    History of cervical cancer    Hyperlipidemia    Hypertension    Mitral regurgitation    a. 12/22023 Echo: Mild-mod MR; b. 05/2022 Echo: Mod MR.   PAH (pulmonary artery hypertension) (HCC)    a. 11/2019 Echo: RVSP ; b .12/2021 Echo: RVSP 39.34mmHg; c. 05/2022 Echo: RVSP 43.80mmHg.   Persistent atrial fibrillation (HCC)    a. Dx 04/2022-->Eliquis  (CHA2DS2VASc = 3); b. 06/2022 Zio: predominantly sinus rhythm at a rate of 66 bpm (range 44-154), 2 brief runs of SVT (fastest 154 bpm x 20 beats) and otherwise rare PACs and PVCs.   Sleep-disordered breathing    Medications:  Apixaban  5 mg twice daily. Last dose: 6/18 in the AM  Assessment: 73 year old  female with history of hypertension, CKD, atrial fibrillation, CKD and recent admission for NSTEMI presenting to the emergency department for evaluation of neurologic decline from neurosurgery office. Patient takes apixaban  for atrial fibrillation PTA. Neurosurgical team plans to take patient to operating room within the next couple of days. Pharmacy has been consulted to initiate and dose continuous heparin  infusion  Goal of Therapy:  Heparin  level 0.3-0.7 units/ml aPTT 66-102 seconds Monitor platelets by anticoagulation protocol: Yes  Labs: 0619 0600 aPTT 98 - therapeutic x 1 / HL > 1.10 - not correlating  0619 1401 aPTT 100 - therapeutic x 2    Plan:  aPTT therapeutic x 2 but on the very upper end  Will decrease rate slightly to 1000 units/hr  Recheck aPTT 8 hours after rate adjustment and HL daily  Continue to dose via aPTT levels until both correlate, then will transition to HL dosing CBC stable; monitor daily and for s/sx of bleeding   Whitney Stuart, PharmD Pharmacy Resident  07/14/2023 2:09 PM

## 2023-07-14 NOTE — Hospital Course (Addendum)
 Hospital course / significant events:  73 y.o. female with medical history significant of anxiety, atrial fibrillation, hypertension, hyperlipidemia, chronic kidney disease, asthma.  She has been having problems with her legs and arms going on for 1-1/2 to 2 months but really declined over the last 2 weeks.  Both arms have been numb and tingling.  Her legs are jumping and has right leg pain.   06/14 she had MRI of the entire spine.  MRI of the cervical spine showing advanced cervical spine disc and posterior element degeneration on multilevel upper cervical spondylolisthesis with severe spinal stenosis with degenerative cord compression at C3-C4 through C5-C6 with myelomalacia.  MRI of the thoracic spine showed spinal stenosis at T6-T7 with mild ventral cord mass effect.  MRI of the lumbar spine showed advanced chronic lumbar spine degeneration with severe chronic disc and posterior element degeneration at L4-L5.  Sent in from neurosurgery office for likely surgery in the next day or so.    Of note, the patient did have a recent hospitalization where her heart enzyme troponin was pretty elevated.  Cardiac catheterization was negative and echocardiogram showed normal EF.  06/18: outpatient neurosurgery visit - sent to hospital anticipating surgery soon. Pt on heparin  gtt, plan Eliquis  washout then surgery.  06/21: Underwent anterior cervical corpectomy, C4-5; anterior cervical arthrodesis C3-C6.  Posterior cervical instrumentation and fusion C3-C6. Heparin  gtt off.  06/22.  Postoperative day 1 stable 06/23.  Postoperative day 2. C-spine x-ray showing some soft tissue swelling --> Decadron . 06/24: drain removed. Starting lovenox  DVT ppx, will need seurosurg clearance to restart Eliquis   06/25-06/27: remains stable, await rehab.  06/28: confirmed for SNF rehab    Consultants:  Neurosurgery   Procedures/Surgeries: 07/16/23: anterior cervical corpectomy, C4-5; anterior cervical arthrodesis C3-C6.   Posterior cervical instrumentation and fusion C3-C6.      ASSESSMENT & PLAN:   Cervical myelopathy  S/p anterior cervical corpectomy on C4-5, anterior cervical arthrodesis C3-C6.  Posterior cervical instrumentation and fusion of C3-C6.   PT and OT.  Recs for rehab.   Pain control  initiated Decadron  on 6/23 d/t continued pain / swelling on XR, tolerated well so tapering this per neurosurgery --> po taper on discharge    Paroxysmal atrial fibrillation  amiodarone  200 mg nightly.  Eliquis  switched over to heparin  drip on evening of 6/18.  Heparin  drip stopped after midnight on 6/21.  Patient will go on DVT prophylactics Lovenox .  Stroke risk higher being off blood thinner.  Will need neurosurgical clearance before restarting Eliquis  --> confirmed ***    Hyperkalemia - resolved hold lisinopril . Monitor BMP   Anxiety Continue Klonopin  and Zoloft .   Essential (primary) hypertension Hold lisinopril  with hyperkalemia   Hyperlipidemia, unspecified Continue Crestor    Asthma, chronic Continue inhalers   Thyroid  nodule TSH and free T4 normal range.   Will need thyroid  ultrasound as outpatient.   Cough CXR no concerns  Encourage IS      Overweight based on BMI: Body mass index is 29.21 kg/m.[name] Significantly low or high BMI is associated with higher medical risk.  Underweight - under 18  overweight - 25 to 29 obese - 30 or more Class 1 obesity: BMI of 30.0 to 34 Class 2 obesity: BMI of 35.0 to 39 Class 3 obesity: BMI of 40.0 to 49 Super Morbid Obesity: BMI 50-59 Super-super Morbid Obesity: BMI 60+ Healthy nutrition and physical activity advised as adjunct to other disease management and risk reduction treatments    DVT prophylaxis: lovenox   IV  fluids: no continuous IV fluids  Nutrition: regular diet  Central lines / other devices: none  Code Status: FULL CODE ACP documentation reviewed: none on file in [facility]  TOC needs: bed offers presented, pending decision from  pt/family Medical barriers to dispo: tapering decadron , pain control, neurosurg clearance. Expected medical readiness for discharge tomorrow .

## 2023-07-14 NOTE — Assessment & Plan Note (Addendum)
 TSH and free T4 normal range.  Will need thyroid  ultrasound as outpatient.

## 2023-07-14 NOTE — Progress Notes (Signed)
 Progress Note   Patient: Whitney Stuart ZOX:[id number] DOB: 10-Aug-1950 DOA: 07/13/2023     1 DOS: the patient was seen and examined on 07/14/2023   Brief hospital course: 73 y.o. female with medical history significant of anxiety, atrial fibrillation, hypertension, hyperlipidemia, chronic kidney disease, asthma.  She has been having problems with her legs and arms going on for 1-1/2 to 2 months but really declined over the last 2 weeks.  Both arms have been numb and tingling.  Her legs are jumping and has right leg pain.  On 6/14 she had MRI of the entire spine.  MRI of the cervical spine showing advanced cervical spine disc and posterior element degeneration on multilevel upper cervical spondylolisthesis with severe spinal stenosis with degenerative cord compression at C3-C4 through C5-C6 with myelomalacia.  MRI of the thoracic spine showed spinal stenosis at T6-T7 with mild ventral cord mass effect.  MRI of the lumbar spine showed advanced chronic lumbar spine degeneration with severe chronic disc and posterior element degeneration at L4-L5.  Sent in from neurosurgery office for likely surgery in the next day or so.   Of note, the patient did have a recent hospitalization where her heart enzyme troponin was pretty elevated.  Cardiac catheterization was negative and echocardiogram showed normal EF.  6/19.  Patient still having symptoms with her arms and legs.  Last dose of Eliquis  was on 618 in the morning.  Patient currently on heparin  drip.  Assessment and Plan: * Cervical myelopathy [facility]) Neurosurgical team plans on taking her to the operating room either Friday or Saturday.  Recent cardiac catheterization was negative and echocardiogram showed a normal EF.  Paroxysmal atrial fibrillation (HCC) Patient on amiodarone  (will decrease dose with heart rate in the 50s).  Eliquis  switched over to heparin  drip on evening of 6/18.  Anxiety Continue Klonopin  and Zoloft .  Essential (primary)  hypertension Continue lisinopril   Hyperlipidemia, unspecified Continue Crestor   Asthma, chronic Continue inhalers  Thyroid  nodule Check TSH and free T4.  Will need thyroid  ultrasound as outpatient.  Overweight (BMI 25.0-29.9) BMI 29.21        Subjective: Patient still having symptoms with her arms and legs.  Admitted with cervical myelopathy.  Physical Exam: Vitals:   07/13/23 2047 07/14/23 0421 07/14/23 0756 07/14/23 0851  BP: (!) 140/58 (!) 106/44 (!) 103/43 118/60  Pulse: 62 (!) 51 (!) 46   Resp: 18 18 18    Temp: 98.5 F (36.9 C) 97.6 F (36.4 C) 97.6 F (36.4 C)   TempSrc:  Oral    SpO2: 99% 100% 99%   Weight:      Height:       Physical Exam HENT:     Head: Normocephalic.     Mouth/Throat:     Pharynx: No oropharyngeal exudate.   Eyes:     General: Lids are normal.     Conjunctiva/sclera: Conjunctivae normal.    Cardiovascular:     Rate and Rhythm: Regular rhythm. Bradycardia present.     Heart sounds: Normal heart sounds, S1 normal and S2 normal.  Pulmonary:     Breath sounds: No decreased breath sounds, wheezing, rhonchi or rales.  Abdominal:     Palpations: Abdomen is soft.     Tenderness: There is no abdominal tenderness.   Musculoskeletal:     Right lower leg: No swelling.     Left lower leg: No swelling.   Skin:    General: Skin is warm.     Findings: No rash.  Neurological:     Mental Status: She is alert and oriented to person, place, and time.     Data Reviewed: Last creatinine 1.24, hemoglobin 10.8  Family Communication: Daughter at bedside  Disposition: Status is: Inpatient Patient will have a neurosurgical procedure on Friday or Saturday depending on neurosurgical schedule.  Planned Discharge Destination: To be determined after surgical procedure    Time spent: 28 minutes  Author: Verla Glaze, MD 07/14/2023 12:14 PM  For on call review [email or web address].

## 2023-07-14 NOTE — Plan of Care (Signed)
  Problem: Education: Goal: Knowledge of General Education information will improve Description: Including pain rating scale, medication(s)/side effects and non-pharmacologic comfort measures Outcome: Progressing   Problem: Health Behavior/Discharge Planning: Goal: Ability to manage health-related needs will improve Outcome: Progressing   Problem: Clinical Measurements: Goal: Ability to maintain clinical measurements within normal limits will improve Outcome: Progressing   Problem: Activity: Goal: Risk for activity intolerance will decrease Outcome: Progressing   Problem: Nutrition: Goal: Adequate nutrition will be maintained Outcome: Progressing   Problem: Coping: Goal: Level of anxiety will decrease Outcome: Progressing   Problem: Pain Managment: Goal: General experience of comfort will improve and/or be controlled Outcome: Progressing   Problem: Safety: Goal: Ability to remain free from injury will improve Outcome: Progressing   Problem: Skin Integrity: Goal: Risk for impaired skin integrity will decrease Outcome: Progressing

## 2023-07-15 DIAGNOSIS — I10 Essential (primary) hypertension: Secondary | ICD-10-CM | POA: Diagnosis not present

## 2023-07-15 DIAGNOSIS — R29898 Other symptoms and signs involving the musculoskeletal system: Secondary | ICD-10-CM | POA: Diagnosis not present

## 2023-07-15 DIAGNOSIS — M4802 Spinal stenosis, cervical region: Secondary | ICD-10-CM | POA: Diagnosis not present

## 2023-07-15 DIAGNOSIS — Z7409 Other reduced mobility: Secondary | ICD-10-CM | POA: Diagnosis not present

## 2023-07-15 DIAGNOSIS — I480 Paroxysmal atrial fibrillation: Secondary | ICD-10-CM | POA: Diagnosis not present

## 2023-07-15 DIAGNOSIS — G959 Disease of spinal cord, unspecified: Secondary | ICD-10-CM | POA: Diagnosis not present

## 2023-07-15 DIAGNOSIS — F419 Anxiety disorder, unspecified: Secondary | ICD-10-CM | POA: Diagnosis not present

## 2023-07-15 LAB — CBC
HCT: 30.6 % — ABNORMAL LOW (ref 36.0–46.0)
Hemoglobin: 10 g/dL — ABNORMAL LOW (ref 12.0–15.0)
MCH: 31.7 pg (ref 26.0–34.0)
MCHC: 32.7 g/dL (ref 30.0–36.0)
MCV: 97.1 fL (ref 80.0–100.0)
Platelets: 312 10*3/uL (ref 150–400)
RBC: 3.15 MIL/uL — ABNORMAL LOW (ref 3.87–5.11)
RDW: 12.6 % (ref 11.5–15.5)
WBC: 6.3 10*3/uL (ref 4.0–10.5)
nRBC: 0 % (ref 0.0–0.2)

## 2023-07-15 LAB — TYPE AND SCREEN
Unit division: 0
Unit division: 0

## 2023-07-15 LAB — BASIC METABOLIC PANEL WITH GFR
Anion gap: 8 (ref 5–15)
BUN: 23 mg/dL (ref 8–23)
CO2: 24 mmol/L (ref 22–32)
Calcium: 8.7 mg/dL — ABNORMAL LOW (ref 8.9–10.3)
Chloride: 109 mmol/L (ref 98–111)
Creatinine, Ser: 1.22 mg/dL — ABNORMAL HIGH (ref 0.44–1.00)
GFR, Estimated: 47 mL/min — ABNORMAL LOW (ref >60)
Glucose, Bld: 91 mg/dL (ref 70–99)
Potassium: 4.6 mmol/L (ref 3.5–5.1)
Sodium: 141 mmol/L (ref 135–145)

## 2023-07-15 LAB — ABO/RH: ABO/RH(D): A POS

## 2023-07-15 LAB — BPAM RBC
Blood Product Expiration Date: 202507212359
Unit Type and Rh: 6200
Unit Type and Rh: 6200

## 2023-07-15 LAB — APTT: aPTT: 92 s — ABNORMAL HIGH (ref 24–36)

## 2023-07-15 LAB — T4, FREE: Free T4: 0.9 ng/dL (ref 0.61–1.12)

## 2023-07-15 LAB — TSH: TSH: 2.043 u[IU]/mL (ref 0.350–4.500)

## 2023-07-15 LAB — HEPARIN LEVEL (UNFRACTIONATED): Heparin Unfractionated: >1.1 [IU]/mL — ABNORMAL HIGH (ref 0.30–0.70)

## 2023-07-15 LAB — PREPARE RBC (CROSSMATCH)

## 2023-07-15 MED ORDER — DEXTROSE-SODIUM CHLORIDE 5-0.9 % IV SOLN: INTRAVENOUS | Status: AC

## 2023-07-15 MED ORDER — CEFAZOLIN SODIUM-DEXTROSE 2-4 GM/100ML-% IV SOLN
2.0000 g | Freq: Three times a day (TID) | INTRAVENOUS | Status: DC
  Administered 2023-07-16 (×2): 2 g via INTRAVENOUS

## 2023-07-15 NOTE — Consult Note (Signed)
 PHARMACY - ANTICOAGULATION CONSULT NOTE  Pharmacy Consult for Heparin  Indication: atrial fibrillation  Allergies  Allergen Reactions   Methylprednisolone Palpitations   Patient Measurements: Height: 5' 6 (167.6 cm) Weight: 82.1 kg (181 lb) IBW/kg (Calculated) : 59.3 HEPARIN  DW (KG): 76.5  Vital Signs: Temp: 98.1 F (36.7 C) (06/19 2005) BP: 123/57 (06/19 2005) Pulse Rate: 61 (06/19 2005)  Labs: Recent Labs    07/13/23 1150 07/13/23 1647 07/13/23 1647 07/14/23 0600 07/14/23 1401 07/14/23 2326  HGB 10.8*  --   --   --   --   --   HCT 33.4*  --   --   --   --   --   PLT 349  --   --   --   --   --   APTT  --  33   < > 98* 100* 83*  LABPROT  --  16.5*  --   --   --   --   INR  --  1.3*  --   --   --   --   HEPARINUNFRC  --   --   --  >1.10*  --   --   CREATININE 1.24*  --   --   --   --   --    < > = values in this interval not displayed.   Estimated Creatinine Clearance: 44.3 mL/min (A) (by C-G formula based on SCr of 1.24 mg/dL (H)).  Medical History: Past Medical History:  Diagnosis Date   Arthritis    Asthma    Asthma    Chest pain    a. 11/2019 Cor CTA: Calcium  score equals 0.  Normal coronary arteries.   CKD (chronic kidney disease)    Diastolic dysfunction    a. 11/2019 Echo: EF 60-65%.  Grade 1 diastolic dysfunction; b. 12/2021 Echo: EF 60-65%, GrI DD; c. 05/2022 Echo: EF 60-65%, no rwma. Nl RV fxn. RVSP 43.55mmHg. Sev dil LA. Mod MR.   GERD (gastroesophageal reflux disease)    History of cervical cancer    Hyperlipidemia    Hypertension    Mitral regurgitation    a. 12/22023 Echo: Mild-mod MR; b. 05/2022 Echo: Mod MR.   PAH (pulmonary artery hypertension) (HCC)    a. 11/2019 Echo: RVSP ; b .12/2021 Echo: RVSP 39.12mmHg; c. 05/2022 Echo: RVSP 43.41mmHg.   Persistent atrial fibrillation (HCC)    a. Dx 04/2022-->Eliquis  (CHA2DS2VASc = 3); b. 06/2022 Zio: predominantly sinus rhythm at a rate of 66 bpm (range 44-154), 2 brief runs of SVT (fastest 154 bpm x  20 beats) and otherwise rare PACs and PVCs.   Sleep-disordered breathing    Medications:  Apixaban  5 mg twice daily. Last dose: 6/18 in the AM  Assessment: 73 year old female with history of hypertension, CKD, atrial fibrillation, CKD and recent admission for NSTEMI presenting to the emergency department for evaluation of neurologic decline from neurosurgery office. Patient takes apixaban  for atrial fibrillation PTA. Neurosurgical team plans to take patient to operating room within the next couple of days. Pharmacy has been consulted to initiate and dose continuous heparin  infusion  Goal of Therapy:  Heparin  level 0.3-0.7 units/ml aPTT 66-102 seconds Monitor platelets by anticoagulation protocol: Yes  Labs: 0619 0600 aPTT 98 - therapeutic x 1 / HL > 1.10 - not correlating  0619 1401 aPTT 100 - therapeutic x 2  0619 2326 aPTT 83, therapeutic X 3    Plan:  6/19 @ 2326:  aPTT = 83, therapeutic X 3 -  will continue pt on current rate and recheck aPTT and HL on 6/20 with AM albs  Continue to dose via aPTT levels until both correlate, then will transition to HL dosing CBC stable; monitor daily and for s/sx of bleeding   Josselyne Onofrio D, PharmD 07/15/2023 12:32 AM

## 2023-07-15 NOTE — Evaluation (Signed)
 Physical Therapy Evaluation Patient Details Name: Whitney Stuart MRN: [id number] DOB: Dec 04, 1950 Today's Date: 07/15/2023  History of Present Illness  Whitney Stuart is a 73yoF who [facility]23 due to ongoing signs of cervical myelopathy, progressive weakness, leg buckling, grip weakness and paresthesias. Pt followed by neurosurgery as an outpatient. Was admitted 6/3-6/5 for AF c RVR, noted polyneuropathy at that time due to cervical spine disease. MRI 07/09/23 IMPRESSION: 1. Advanced cervical spine disc and posterior element degeneration superimposed on multilevel upper cervical spondylolisthesis. Subsequent multifactorial moderate and severe spinal stenosis with Degenerative Cord Compression C3-C4 through C5-C6. Subtle evidence of associated Spinal Cord Myelomalacia at those levels. 2. Mild cervical spinal stenosis elsewhere. Associated up to severe degenerative neural foraminal stenosis at the left C3, bilateral C4, right C5, bilateral C6, and left C7 nerve levels.  PMH: NSTEMI, AF c RVR, HTN, CKD3a, MR, HFpEF.  Clinical Impression  Evaluation revealing of impaired AMB, impaired strength in grips, trunk, and functional leg extension. Pt requires elevated surface height to come to standing, close guarding to achieve short distance AMB with a RW. Pt has been having frequent legs buckling daily PTA without any prodrome. Current plan is for pt to go to OR tomorrow with neurosurgery, likely will result in eventual improvement in these deficits, however given chronicity, anticipate a lengthy rehab process to regain functional independence and safety. Will continue to follow.       If plan is discharge home, recommend the following: Assist for transportation;Assistance with cooking/housework;Help with stairs or ramp for entrance   Can travel by private vehicle   Yes    Equipment Recommendations BSC/3in1  Recommendations for Other Services       Functional Status Assessment Patient has had  a recent decline in their functional status and demonstrates the ability to make significant improvements in function in a reasonable and predictable amount of time.     Precautions / Restrictions Precautions Precautions: Fall Restrictions Weight Bearing Restrictions Per Provider Order: No      Mobility  Bed Mobility Overal bed mobility: Needs Assistance Bed Mobility: Supine to Sit     Supine to sit: Supervision     General bed mobility comments: very labored, max effort    Transfers Overall transfer level: Needs assistance Equipment used: Rolling walker (2 wheels) Transfers: Sit to/from Stand Sit to Stand: Min assist           General transfer comment: fairly weak, but able to perform from elevated height    Ambulation/Gait Ambulation/Gait assistance: Contact guard assist Gait Distance (Feet): 16 Feet (twice) Assistive device: Rolling walker (2 wheels)       Pre-gait activities: STS x5; then step pivot transfer x4; General Gait Details: steady with RW, no buckling episodes.  Stairs            Wheelchair Mobility     Tilt Bed    Modified Rankin (Stroke Patients Only)       Balance                                             Pertinent Vitals/Pain Pain Assessment Pain Assessment: Faces Faces Pain Scale: Hurts little more Pain Location: Right leg pain Pain Descriptors / Indicators: Aching Pain Intervention(s): Limited activity within patient's tolerance, Monitored during session, Premedicated before session    Home Living Family/patient expects to be discharged to:: Skilled  nursing facility Living Arrangements: Children;Other relatives (DTR is an EMT) Available Help at Discharge: Family Type of Home: House Home Access: Ramped entrance       Home Layout: One level Home Equipment: Agricultural consultant (2 wheels);Wheelchair - manual      Prior Function Prior Level of Function : Independent/Modified Independent              Mobility Comments: progressive decline over the past few weeks, near-daily episodes of legs buckling; has regressed to needing a RW for all mboilty       Extremity/Trunk Assessment   Upper Extremity Assessment Upper Extremity Assessment: RUE deficits/detail;LUE deficits/detail (significant grip strength impairment)            Communication        Cognition Arousal: Alert Behavior During Therapy: WFL for tasks assessed/performed   PT - Cognitive impairments: History of cognitive impairments                                 Cueing       General Comments      Exercises Other Exercises Other Exercises: STS from elevated EOB x5, supervision level assistance Other Exercises: AMB 35ft forward, 36ft backward with RW (then needs a break) minGuard asssit Other Exercises: AMB around bed to recliner ~13ft, RW, minGuard assist   Assessment/Plan    PT Assessment Patient needs continued PT services  PT Problem List Decreased strength;Decreased range of motion;Decreased activity tolerance;Decreased balance;Decreased mobility;Decreased knowledge of precautions;Decreased safety awareness       PT Treatment Interventions DME instruction;Gait training;Stair training;Functional mobility training;Therapeutic activities;Therapeutic exercise;Balance training;Patient/family education    PT Goals (Current goals can be found in the Care Plan section)  Acute Rehab PT Goals Patient Stated Goal: regain mobility stop falling episodes PT Goal Formulation: With patient Time For Goal Achievement: 07/29/23 Potential to Achieve Goals: Fair    Frequency Min 2X/week     Co-evaluation               AM-PAC PT 6 Clicks Mobility  Outcome Measure Help needed turning from your back to your side while in a flat bed without using bedrails?: A Little Help needed moving from lying on your back to sitting on the side of a flat bed without using bedrails?: A Little Help needed  moving to and from a bed to a chair (including a wheelchair)?: A Lot Help needed standing up from a chair using your arms (e.g., wheelchair or bedside chair)?: A Lot Help needed to walk in hospital room?: A Lot Help needed climbing 3-5 steps with a railing? : A Lot 6 Click Score: 14    End of Session Equipment Utilized During Treatment: Gait belt Activity Tolerance: Patient tolerated treatment well;No increased pain;Patient limited by fatigue Patient left: in chair;with family/visitor present;with call bell/phone within reach Nurse Communication: Mobility status PT Visit Diagnosis: Difficulty in walking, not elsewhere classified (R26.2);Unsteadiness on feet (R26.81);Other abnormalities of gait and mobility (R26.89);Muscle weakness (generalized) (M62.81);Other symptoms and signs involving the nervous system (R29.898)    Time: 1003-1036 PT Time Calculation (min) (ACUTE ONLY): 33 min   Charges:   PT Evaluation $PT Eval Moderate Complexity: 1 Mod PT Treatments $Therapeutic Activity: 8-22 mins PT General Charges $$ ACUTE PT VISIT: 1 Visit       1:52 PM, 07/15/23 Dawn Eth, PT, DPT Physical Therapist - [facility]  [phone] [facility])  Undra Trembath C 07/15/2023, 1:50 PM

## 2023-07-15 NOTE — Progress Notes (Signed)
 OT Cancellation Note  Patient Details Name: Whitney Stuart MRN: [id number] DOB: 1950/11/29   Cancelled Treatment:    Reason Eval/Treat Not Completed: Other (comment) (per chart, pt is scheduled for surgery tomorrow with neurosugery, will need new OT orders post procedure as approrpiate. Thank you.) Gerre Kraft, OTD OTR/L  07/15/23, 2:44 PM

## 2023-07-15 NOTE — Progress Notes (Signed)
 Attending Progress Note  History: Whitney Stuart is here for progressive cervical myeloradiculopathy.  She has had a rapid progression of inability to walk, clonus, upper extremity weakness numbness and tingling.  Given her rapid progression she was admitted from clinic.    Physical Exam: Vitals:   07/15/23 0805 07/15/23 0900  BP: (!) 117/48 (!) 128/59  Pulse: (!) 50   Resp: 16   Temp: 98 F (36.7 C)   SpO2: 95%     AA Ox3 CNI  Patient continues to have severe weakness in the bilateral upper extremities, on the right side her handgrip is approximately 2 out of 5, wrist extension 3 out of 5, elbow flexion 4 on the right and 4+ on the left.  Deltoids are 3 bilaterally.  Hip flexors are pain limited.  Dorsiflexion is 2-3 bilaterally, EHL is 2-3 bilaterally, difficult to test given spontaneous clonus with activation.  She is hyperreflexic with Hoffman's reflex, and spreading, as well as crossed adductor reflexes and clonus elicitation.  Negative jaw jerk.  Data:  Recent Labs  Lab 07/13/23 1150 07/15/23 0515  NA 138 141  K 4.9 4.6  CL 107 109  CO2 23 24  BUN 22 23  CREATININE 1.24* 1.22*  GLUCOSE 137* 91  CALCIUM  9.1 8.7*   Recent Labs  Lab 07/13/23 1150  AST 11*  ALT 14  ALKPHOS 54     Recent Labs  Lab 07/13/23 1150 07/15/23 0515  WBC 7.4 6.3  HGB 10.8* 10.0*  HCT 33.4* 30.6*  PLT 349 312   Recent Labs  Lab 07/13/23 1647 07/14/23 0600 07/15/23 0515  APTT 33   < > 92*  INR 1.3*  --   --    < > = values in this interval not displayed.        CT Cervical Spine Wo Contrast Result Date: 07/13/2023 CLINICAL DATA:  Myelopathy, acute, cervical spine EXAM: CT CERVICAL SPINE WITHOUT CONTRAST TECHNIQUE: Multidetector CT imaging of the cervical spine was performed without intravenous contrast. Multiplanar CT image reconstructions were also generated. RADIATION DOSE REDUCTION: This exam was performed according to the departmental dose-optimization program which includes  automated exposure control, adjustment of the mA and/or kV according to patient size and/or use of iterative reconstruction technique. COMPARISON:  MRI cervical spine 07/09/2023 FINDINGS: Alignment: Grade 1 anterolisthesis of C3 on C4 and C4 on C5. Skull base and vertebrae: Multilevel severe degenerative changes of the spine. Associated severe osseous neural foraminal stenosis of the bilateral C4-C5 levels. No severe osseous central canal stenosis. Posterior disc osteophyte complex formation at the C4-C5 C5-C6 levels. No acute fracture. No aggressive appearing focal osseous lesion or focal pathologic process. Soft tissues and spinal canal: No prevertebral fluid or swelling. No visible canal hematoma. Upper chest: Unremarkable. Other: Enlarged right thyroid  gland with multiple hypodense nodules measuring up to 2 cm. Atherosclerotic plaque of the carotid arteries within the neck. IMPRESSION: 1. No acute displaced fracture or traumatic listhesis of the cervical spine. 2. Incidental right thyroid  nodules, measuring up to 2 cm. Recommend non-emergent thyroid  ultrasound. Reference: J Am Coll Radiol. 2015 Feb;12(2): 143-50. Electronically Signed   By: Morgane  Naveau M.D.   On: 07/13/2023 18:39   Narrative & Impression  CLINICAL DATA:  73 year old female with radiculopathy, unsteady gait, gradual onset of bilateral upper extremity numbness and lower extremity tremor.   EXAM: MRI CERVICAL SPINE WITHOUT CONTRAST   TECHNIQUE: Multiplanar, multisequence MR imaging of the cervical spine was performed. No intravenous contrast was administered.  COMPARISON:  Cervical spine radiographs 05/24/2023. Thoracic and lumbar MRI today reported separately.   FINDINGS: Alignment: Straightening of lordosis on the previous radiographs, now with increased appearance of multilevel degenerative appearing upper cervical spondylolisthesis (anterolisthesis of C3 on C4 and C4 on C5 up to 3 mm) and reversal mild reversal of the  normal cervical lordosis.   Vertebrae: Normal background bone marrow signal and maintained vertebral height. But bulky disc and endplate degeneration, bilateral cervical facet degeneration. And on the right side there is degenerative appearing marrow edema and joint effusion of the chronically hypertrophied C3-C4 facets (series 18, image 3). No other acute osseous abnormality.   Cord: Degenerative cord compression, moderate at C3-C4 (series 16, image 8) and continuing through C5-C6, up to severe at that level (series 19, image 19). There is subtle evidence of spinal cord signal heterogeneity at the compressed levels. Myelomalacia is favored over cord edema. Above C3 and below C6 the cord signal and morphology are normal.   Posterior Fossa, vertebral arteries, paraspinal tissues: Cervicomedullary junction is within normal limits. Negative visible posterior fossa, grossly negative other visible brain parenchyma. Preserved major vascular flow voids in the bilateral neck, the right vertebral artery has a late entry into the cervical transverse foramen at the C4-C5 level on series 19, image 14. Negative visible neck soft tissues.   Disc levels:   C2-C3: Mild to moderate disc and endplate, moderate to severe facet and ligament flavum degeneration and hypertrophy (series 19, image 5). Moderate to severe left C3 foraminal stenosis. Mild to moderate right foraminal stenosis. Mild spinal stenosis beginning just below the disc space level.   C3-C4: Moderate to severe multifactorial spinal stenosis with cord compression (series 19, image 10) in the setting of grade 1 anterolisthesis, bulky disc and endplate degeneration, moderate to severe facet and ligament flavum hypertrophy. And superimposed degenerative appearing marrow edema in the right side facets as above. Severe left and moderate to severe right C4 foraminal stenosis.   C4-C5: Similar moderate to severe spinal stenosis and  cord compression in the setting of spondylolisthesis with bulky disc osteophyte degeneration (midline posterior component series 19, image 14), moderate to severe facet and ligament flavum hypertrophy. Severe right C5 foraminal stenosis, but mild to moderate left C5 foraminal stenosis.   C5-C6: Severe disc space loss with bulky circumferential disc osteophyte complex. Broad-based posterior component superimposed on mild to moderate facet and ligament flavum hypertrophy. Moderate to severe spinal stenosis with cord compression. Severe left and moderate to severe right C6 foraminal stenosis.   C6-C7: Severe disc space loss also but less pronounced and more anterior eccentric circumferential disc osteophyte complex. Mild spinal stenosis. No cord mass effect. Moderate to severe left, mild right C7 foraminal stenosis.   C7-T1: Better preserved disc space. Circumferential disc bulge and endplate spurring. Mild to moderate facet and ligament flavum hypertrophy. No stenosis.   Thoracic levels reported separately today.   IMPRESSION: 1. Advanced cervical spine disc and posterior element degeneration superimposed on multilevel upper cervical spondylolisthesis. Subsequent multifactorial moderate and severe spinal stenosis with Degenerative Cord Compression C3-C4 through C5-C6. Subtle evidence of associated Spinal Cord Myelomalacia at those levels.   2. Mild cervical spinal stenosis elsewhere. Associated up to severe degenerative neural foraminal stenosis at the left C3, bilateral C4, right C5, bilateral C6, and left C7 nerve levels.   Electronically Signed: By: Marlise Simpers M.D. On: 07/13/2023 09:30    Assessment/Plan:  KIAHNA BANGHART with progressive cervical myeloradiculopathy, rapid progression over the past few weeks.  She went from being ambulatory to nonambulatory between clinic visits.  She had progressive upper extremity weakness.  Her hyperreflexia is spread throughout.  She got a  recent MRI which demonstrated severe cervical stenosis with T2 signal change.  We obtained cervical CT and x-rays which demonstrated compression of her spinal cord that was also behind the body unamenable to a disc only approach, because of this we discussed a two-level anterior cervical corpectomy at C4 and C5, we will instrument at this level and then back her up with posterior cervical decompression fusion from C3-C6.  Will plan on doing this tomorrow morning so that she has been off of her Eliquis  for 3 days.  Recommendations: N.p.o. at midnight, maintenance fluids Preoperative wean off of her heparin  drip Crossmatch for 2 units of packed red cells in preparation for the OR tomorrow, orders have been placed.   Carroll Clamp, MD Department of Neurosurgery

## 2023-07-15 NOTE — Progress Notes (Signed)
 Progress Note   Patient: Whitney Stuart:[id number] DOB: 08-Jun-1950 DOA: 07/13/2023     2 DOS: the patient was seen and examined on 07/15/2023   Brief hospital course: 73 y.o. female with medical history significant of anxiety, atrial fibrillation, hypertension, hyperlipidemia, chronic kidney disease, asthma.  She has been having problems with her legs and arms going on for 1-1/2 to 2 months but really declined over the last 2 weeks.  Both arms have been numb and tingling.  Her legs are jumping and has right leg pain.  On 6/14 she had MRI of the entire spine.  MRI of the cervical spine showing advanced cervical spine disc and posterior element degeneration on multilevel upper cervical spondylolisthesis with severe spinal stenosis with degenerative cord compression at C3-C4 through C5-C6 with myelomalacia.  MRI of the thoracic spine showed spinal stenosis at T6-T7 with mild ventral cord mass effect.  MRI of the lumbar spine showed advanced chronic lumbar spine degeneration with severe chronic disc and posterior element degeneration at L4-L5.  Sent in from neurosurgery office for likely surgery in the next day or so.   Of note, the patient did have a recent hospitalization where her heart enzyme troponin was pretty elevated.  Cardiac catheterization was negative and echocardiogram showed normal EF.  6/19.  Patient still having symptoms with her arms and legs.  Last dose of Eliquis  was on 618 in the morning.  Patient currently on heparin  drip. 6/20.  Neurosurgical procedure for tomorrow   Assessment and Plan: * Cervical myelopathy [facility]) Neurosurgical team plans on taking her to the operating room on 6/21.  Patient with progressive weakness arms and legs.  Paroxysmal atrial fibrillation (HCC) Patient on amiodarone  on decrease dose to 100 mg nightly with heart rate being in the 50s.  Eliquis  switched over to heparin  drip on evening of 6/18.  Anxiety Continue Klonopin  and Zoloft .  Essential  (primary) hypertension Continue lisinopril   Hyperlipidemia, unspecified Continue Crestor   Asthma, chronic Continue inhalers  Thyroid  nodule TSH and free T4 normal range.  Will need thyroid  ultrasound as outpatient.  Overweight (BMI 25.0-29.9) BMI 29.21        Subjective: Patient still having jumping with her legs.  Still having some leg pains.  Admitted with cervical myelopathy.  Physical Exam: Vitals:   07/14/23 2005 07/15/23 0526 07/15/23 0805 07/15/23 0900  BP: (!) 123/57 (!) 126/58 (!) 117/48 (!) 128/59  Pulse: 61 (!) 50 (!) 50   Resp: 18 18 16    Temp: 98.1 F (36.7 C) 97.6 F (36.4 C) 98 F (36.7 C)   TempSrc:  Oral Oral   SpO2: 96% 99% 95%   Weight:      Height:       Physical Exam HENT:     Head: Normocephalic.   Eyes:     General: Lids are normal.     Conjunctiva/sclera: Conjunctivae normal.    Cardiovascular:     Rate and Rhythm: Normal rate and regular rhythm.     Heart sounds: S1 normal and S2 normal. Murmur heard.     Systolic murmur is present with a grade of 2/6.  Pulmonary:     Breath sounds: No decreased breath sounds, wheezing, rhonchi or rales.  Abdominal:     Palpations: Abdomen is soft.     Tenderness: There is no abdominal tenderness.   Musculoskeletal:     Right lower leg: No swelling.     Left lower leg: No swelling.   Skin:    General:  Skin is warm.     Findings: No rash.   Neurological:     Mental Status: She is alert and oriented to person, place, and time.     Data Reviewed: Creatinine 1.22, hemoglobin 10.0 Family Communication: Family at bedside and on the phone  Disposition: Status is: Inpatient Remains inpatient appropriate because: Neurosurgical procedure for tomorrow  Planned Discharge Destination: Rehab    Time spent: 28 minutes  Author: Verla Glaze, MD 07/15/2023 1:54 PM  For on call review [email or web address].

## 2023-07-15 NOTE — Consult Note (Signed)
 PHARMACY - ANTICOAGULATION CONSULT NOTE  Pharmacy Consult for Heparin  Indication: atrial fibrillation  Allergies  Allergen Reactions   Methylprednisolone Palpitations   Patient Measurements: Height: 5' 6 (167.6 cm) Weight: 82.1 kg (181 lb) IBW/kg (Calculated) : 59.3 HEPARIN  DW (KG): 76.5  Vital Signs: Temp: 97.6 F (36.4 C) (06/20 0526) Temp Source: Oral (06/20 0526) BP: 126/58 (06/20 0526) Pulse Rate: 50 (06/20 0526)  Labs: Recent Labs    07/13/23 1150 07/13/23 1647 07/13/23 1647 07/14/23 0600 07/14/23 1401 07/14/23 2326 07/15/23 0515  HGB 10.8*  --   --   --   --   --  10.0*  HCT 33.4*  --   --   --   --   --  30.6*  PLT 349  --   --   --   --   --  312  APTT  --  33   < > 98* 100* 83* 92*  LABPROT  --  16.5*  --   --   --   --   --   INR  --  1.3*  --   --   --   --   --   HEPARINUNFRC  --   --   --  >1.10*  --   --  >1.10*  CREATININE 1.24*  --   --   --   --   --  1.22*   < > = values in this interval not displayed.   Estimated Creatinine Clearance: 45 mL/min (A) (by C-G formula based on SCr of 1.22 mg/dL (H)).  Medical History: Past Medical History:  Diagnosis Date   Arthritis    Asthma    Asthma    Chest pain    a. 11/2019 Cor CTA: Calcium  score equals 0.  Normal coronary arteries.   CKD (chronic kidney disease)    Diastolic dysfunction    a. 11/2019 Echo: EF 60-65%.  Grade 1 diastolic dysfunction; b. 12/2021 Echo: EF 60-65%, GrI DD; c. 05/2022 Echo: EF 60-65%, no rwma. Nl RV fxn. RVSP 43.61mmHg. Sev dil LA. Mod MR.   GERD (gastroesophageal reflux disease)    History of cervical cancer    Hyperlipidemia    Hypertension    Mitral regurgitation    a. 12/22023 Echo: Mild-mod MR; b. 05/2022 Echo: Mod MR.   PAH (pulmonary artery hypertension) (HCC)    a. 11/2019 Echo: RVSP ; b .12/2021 Echo: RVSP 39.72mmHg; c. 05/2022 Echo: RVSP 43.27mmHg.   Persistent atrial fibrillation (HCC)    a. Dx 04/2022-->Eliquis  (CHA2DS2VASc = 3); b. 06/2022 Zio: predominantly  sinus rhythm at a rate of 66 bpm (range 44-154), 2 brief runs of SVT (fastest 154 bpm x 20 beats) and otherwise rare PACs and PVCs.   Sleep-disordered breathing    Medications:  Apixaban  5 mg twice daily. Last dose: 6/18 in the AM  Assessment: 73 year old female with history of hypertension, CKD, atrial fibrillation, CKD and recent admission for NSTEMI presenting to the emergency department for evaluation of neurologic decline from neurosurgery office. Patient takes apixaban  for atrial fibrillation PTA. Neurosurgical team plans to take patient to operating room within the next couple of days. Pharmacy has been consulted to initiate and dose continuous heparin  infusion  Goal of Therapy:  Heparin  level 0.3-0.7 units/ml aPTT 66-102 seconds Monitor platelets by anticoagulation protocol: Yes  Labs: 0619 0600 aPTT 98 - therapeutic x 1 / HL > 1.10 - not correlating  0619 1401 aPTT 100 - therapeutic x 2  0620 0515 aPTT 92,  HL > 1.10    Plan:  6/20 @ 0515: aPTT = 92,  HL = > 1.10 - will continue pt on current rate and recheck HL and aPTT on 6/21 with AM labs  Continue to dose via aPTT levels until both correlate, then will transition to HL dosing CBC stable; monitor daily and for s/sx of bleeding   Minal Stuller D, PharmD 07/15/2023 6:47 AM

## 2023-07-15 NOTE — H&P (View-Only) (Signed)
 Attending Progress Note  History: Whitney Stuart is here for progressive cervical myeloradiculopathy.  She has had a rapid progression of inability to walk, clonus, upper extremity weakness numbness and tingling.  Given her rapid progression she was admitted from clinic.    Physical Exam: Vitals:   07/15/23 0805 07/15/23 0900  BP: (!) 117/48 (!) 128/59  Pulse: (!) 50   Resp: 16   Temp: 98 F (36.7 C)   SpO2: 95%     AA Ox3 CNI  Patient continues to have severe weakness in the bilateral upper extremities, on the right side her handgrip is approximately 2 out of 5, wrist extension 3 out of 5, elbow flexion 4 on the right and 4+ on the left.  Deltoids are 3 bilaterally.  Hip flexors are pain limited.  Dorsiflexion is 2-3 bilaterally, EHL is 2-3 bilaterally, difficult to test given spontaneous clonus with activation.  She is hyperreflexic with Hoffman's reflex, and spreading, as well as crossed adductor reflexes and clonus elicitation.  Negative jaw jerk.  Data:  Recent Labs  Lab 07/13/23 1150 07/15/23 0515  NA 138 141  K 4.9 4.6  CL 107 109  CO2 23 24  BUN 22 23  CREATININE 1.24* 1.22*  GLUCOSE 137* 91  CALCIUM  9.1 8.7*   Recent Labs  Lab 07/13/23 1150  AST 11*  ALT 14  ALKPHOS 54     Recent Labs  Lab 07/13/23 1150 07/15/23 0515  WBC 7.4 6.3  HGB 10.8* 10.0*  HCT 33.4* 30.6*  PLT 349 312   Recent Labs  Lab 07/13/23 1647 07/14/23 0600 07/15/23 0515  APTT 33   < > 92*  INR 1.3*  --   --    < > = values in this interval not displayed.        CT Cervical Spine Wo Contrast Result Date: 07/13/2023 CLINICAL DATA:  Myelopathy, acute, cervical spine EXAM: CT CERVICAL SPINE WITHOUT CONTRAST TECHNIQUE: Multidetector CT imaging of the cervical spine was performed without intravenous contrast. Multiplanar CT image reconstructions were also generated. RADIATION DOSE REDUCTION: This exam was performed according to the departmental dose-optimization program which includes  automated exposure control, adjustment of the mA and/or kV according to patient size and/or use of iterative reconstruction technique. COMPARISON:  MRI cervical spine 07/09/2023 FINDINGS: Alignment: Grade 1 anterolisthesis of C3 on C4 and C4 on C5. Skull base and vertebrae: Multilevel severe degenerative changes of the spine. Associated severe osseous neural foraminal stenosis of the bilateral C4-C5 levels. No severe osseous central canal stenosis. Posterior disc osteophyte complex formation at the C4-C5 C5-C6 levels. No acute fracture. No aggressive appearing focal osseous lesion or focal pathologic process. Soft tissues and spinal canal: No prevertebral fluid or swelling. No visible canal hematoma. Upper chest: Unremarkable. Other: Enlarged right thyroid  gland with multiple hypodense nodules measuring up to 2 cm. Atherosclerotic plaque of the carotid arteries within the neck. IMPRESSION: 1. No acute displaced fracture or traumatic listhesis of the cervical spine. 2. Incidental right thyroid  nodules, measuring up to 2 cm. Recommend non-emergent thyroid  ultrasound. Reference: J Am Coll Radiol. 2015 Feb;12(2): 143-50. Electronically Signed   By: Morgane  Naveau M.D.   On: 07/13/2023 18:39   Narrative & Impression  CLINICAL DATA:  73 year old female with radiculopathy, unsteady gait, gradual onset of bilateral upper extremity numbness and lower extremity tremor.   EXAM: MRI CERVICAL SPINE WITHOUT CONTRAST   TECHNIQUE: Multiplanar, multisequence MR imaging of the cervical spine was performed. No intravenous contrast was administered.  COMPARISON:  Cervical spine radiographs 05/24/2023. Thoracic and lumbar MRI today reported separately.   FINDINGS: Alignment: Straightening of lordosis on the previous radiographs, now with increased appearance of multilevel degenerative appearing upper cervical spondylolisthesis (anterolisthesis of C3 on C4 and C4 on C5 up to 3 mm) and reversal mild reversal of the  normal cervical lordosis.   Vertebrae: Normal background bone marrow signal and maintained vertebral height. But bulky disc and endplate degeneration, bilateral cervical facet degeneration. And on the right side there is degenerative appearing marrow edema and joint effusion of the chronically hypertrophied C3-C4 facets (series 18, image 3). No other acute osseous abnormality.   Cord: Degenerative cord compression, moderate at C3-C4 (series 16, image 8) and continuing through C5-C6, up to severe at that level (series 19, image 19). There is subtle evidence of spinal cord signal heterogeneity at the compressed levels. Myelomalacia is favored over cord edema. Above C3 and below C6 the cord signal and morphology are normal.   Posterior Fossa, vertebral arteries, paraspinal tissues: Cervicomedullary junction is within normal limits. Negative visible posterior fossa, grossly negative other visible brain parenchyma. Preserved major vascular flow voids in the bilateral neck, the right vertebral artery has a late entry into the cervical transverse foramen at the C4-C5 level on series 19, image 14. Negative visible neck soft tissues.   Disc levels:   C2-C3: Mild to moderate disc and endplate, moderate to severe facet and ligament flavum degeneration and hypertrophy (series 19, image 5). Moderate to severe left C3 foraminal stenosis. Mild to moderate right foraminal stenosis. Mild spinal stenosis beginning just below the disc space level.   C3-C4: Moderate to severe multifactorial spinal stenosis with cord compression (series 19, image 10) in the setting of grade 1 anterolisthesis, bulky disc and endplate degeneration, moderate to severe facet and ligament flavum hypertrophy. And superimposed degenerative appearing marrow edema in the right side facets as above. Severe left and moderate to severe right C4 foraminal stenosis.   C4-C5: Similar moderate to severe spinal stenosis and  cord compression in the setting of spondylolisthesis with bulky disc osteophyte degeneration (midline posterior component series 19, image 14), moderate to severe facet and ligament flavum hypertrophy. Severe right C5 foraminal stenosis, but mild to moderate left C5 foraminal stenosis.   C5-C6: Severe disc space loss with bulky circumferential disc osteophyte complex. Broad-based posterior component superimposed on mild to moderate facet and ligament flavum hypertrophy. Moderate to severe spinal stenosis with cord compression. Severe left and moderate to severe right C6 foraminal stenosis.   C6-C7: Severe disc space loss also but less pronounced and more anterior eccentric circumferential disc osteophyte complex. Mild spinal stenosis. No cord mass effect. Moderate to severe left, mild right C7 foraminal stenosis.   C7-T1: Better preserved disc space. Circumferential disc bulge and endplate spurring. Mild to moderate facet and ligament flavum hypertrophy. No stenosis.   Thoracic levels reported separately today.   IMPRESSION: 1. Advanced cervical spine disc and posterior element degeneration superimposed on multilevel upper cervical spondylolisthesis. Subsequent multifactorial moderate and severe spinal stenosis with Degenerative Cord Compression C3-C4 through C5-C6. Subtle evidence of associated Spinal Cord Myelomalacia at those levels.   2. Mild cervical spinal stenosis elsewhere. Associated up to severe degenerative neural foraminal stenosis at the left C3, bilateral C4, right C5, bilateral C6, and left C7 nerve levels.   Electronically Signed: By: Marlise Simpers M.D. On: 07/13/2023 09:30    Assessment/Plan:  Whitney Stuart with progressive cervical myeloradiculopathy, rapid progression over the past few weeks.  She went from being ambulatory to nonambulatory between clinic visits.  She had progressive upper extremity weakness.  Her hyperreflexia is spread throughout.  She got a  recent MRI which demonstrated severe cervical stenosis with T2 signal change.  We obtained cervical CT and x-rays which demonstrated compression of her spinal cord that was also behind the body unamenable to a disc only approach, because of this we discussed a two-level anterior cervical corpectomy at C4 and C5, we will instrument at this level and then back her up with posterior cervical decompression fusion from C3-C6.  Will plan on doing this tomorrow morning so that she has been off of her Eliquis  for 3 days.  Recommendations: N.p.o. at midnight, maintenance fluids Preoperative wean off of her heparin  drip Crossmatch for 2 units of packed red cells in preparation for the OR tomorrow, orders have been placed.   Carroll Clamp, MD Department of Neurosurgery

## 2023-07-15 NOTE — Plan of Care (Signed)
  Problem: Education: Goal: Knowledge of General Education information will improve Description: Including pain rating scale, medication(s)/side effects and non-pharmacologic comfort measures Outcome: Progressing   Problem: Clinical Measurements: Goal: Ability to maintain clinical measurements within normal limits will improve Outcome: Progressing   Problem: Activity: Goal: Risk for activity intolerance will decrease Outcome: Progressing   Problem: Nutrition: Goal: Adequate nutrition will be maintained Outcome: Progressing   Problem: Coping: Goal: Level of anxiety will decrease Outcome: Progressing   Problem: Elimination: Goal: Will not experience complications related to bowel motility Outcome: Progressing   Problem: Pain Managment: Goal: General experience of comfort will improve and/or be controlled Outcome: Progressing   Problem: Safety: Goal: Ability to remain free from injury will improve Outcome: Progressing

## 2023-07-16 ENCOUNTER — Encounter
Admission: EM | Disposition: A | Discharge status: Skilled Nursing Facility | Payer: Self-pay | Source: Ambulatory Visit | Attending: Internal Medicine

## 2023-07-16 ENCOUNTER — Other Ambulatory Visit: Payer: Self-pay

## 2023-07-16 ENCOUNTER — Inpatient Hospital Stay

## 2023-07-16 ENCOUNTER — Inpatient Hospital Stay: Payer: Self-pay | Admitting: Anesthesiology

## 2023-07-16 DIAGNOSIS — I480 Paroxysmal atrial fibrillation: Secondary | ICD-10-CM | POA: Diagnosis not present

## 2023-07-16 DIAGNOSIS — I10 Essential (primary) hypertension: Secondary | ICD-10-CM | POA: Diagnosis not present

## 2023-07-16 DIAGNOSIS — M4802 Spinal stenosis, cervical region: Secondary | ICD-10-CM | POA: Diagnosis present

## 2023-07-16 DIAGNOSIS — R29898 Other symptoms and signs involving the musculoskeletal system: Secondary | ICD-10-CM | POA: Diagnosis not present

## 2023-07-16 DIAGNOSIS — G959 Disease of spinal cord, unspecified: Secondary | ICD-10-CM | POA: Diagnosis not present

## 2023-07-16 DIAGNOSIS — M4712 Other spondylosis with myelopathy, cervical region: Secondary | ICD-10-CM | POA: Diagnosis present

## 2023-07-16 DIAGNOSIS — F419 Anxiety disorder, unspecified: Secondary | ICD-10-CM | POA: Diagnosis not present

## 2023-07-16 DIAGNOSIS — Z7409 Other reduced mobility: Secondary | ICD-10-CM | POA: Insufficient documentation

## 2023-07-16 HISTORY — PX: ANTERIOR CERVICAL CORPECTOMY: SHX1159

## 2023-07-16 HISTORY — PX: POSTERIOR CERVICAL FUSION/FORAMINOTOMY: SHX5038

## 2023-07-16 LAB — CBC
HCT: 28 % — ABNORMAL LOW (ref 36.0–46.0)
Hemoglobin: 9.3 g/dL — ABNORMAL LOW (ref 12.0–15.0)
MCH: 32.1 pg (ref 26.0–34.0)
MCHC: 33.2 g/dL (ref 30.0–36.0)
MCV: 96.6 fL (ref 80.0–100.0)
Platelets: 296 10*3/uL (ref 150–400)
RBC: 2.9 MIL/uL — ABNORMAL LOW (ref 3.87–5.11)
RDW: 12.3 % (ref 11.5–15.5)
WBC: 6.4 10*3/uL (ref 4.0–10.5)
nRBC: 0 % (ref 0.0–0.2)

## 2023-07-16 LAB — HEPARIN LEVEL (UNFRACTIONATED): Heparin Unfractionated: 0.59 [IU]/mL (ref 0.30–0.70)

## 2023-07-16 LAB — APTT: aPTT: 31 s (ref 24–36)

## 2023-07-16 SURGERY — ANTERIOR CERVICAL CORPECTOMY
Anesthesia: General | Site: Neck

## 2023-07-16 MED ORDER — LIDOCAINE HCL (CARDIAC) PF 100 MG/5ML IV SOSY
PREFILLED_SYRINGE | INTRAVENOUS | Status: DC | PRN
  Administered 2023-07-16: 80 mg via INTRAVENOUS

## 2023-07-16 MED ORDER — BUPIVACAINE LIPOSOME 1.3 % IJ SUSP
INTRAMUSCULAR | Status: AC
  Filled 2023-07-16: qty 10

## 2023-07-16 MED ORDER — IRRISEPT - 450ML BOTTLE WITH 0.05% CHG IN STERILE WATER, USP 99.95% OPTIME
TOPICAL | Status: DC | PRN
  Administered 2023-07-16: 450 mL via TOPICAL

## 2023-07-16 MED ORDER — SODIUM CHLORIDE 0.9 % IV SOLN
INTRAVENOUS | Status: DC | PRN
Start: 2023-07-16 — End: 2023-07-16

## 2023-07-16 MED ORDER — REMIFENTANIL HCL 1 MG IV SOLR
INTRAVENOUS | Status: AC
  Filled 2023-07-16: qty 1000

## 2023-07-16 MED ORDER — SODIUM CHLORIDE (PF) 0.9 % IJ SOLN
INTRAMUSCULAR | Status: AC
  Filled 2023-07-16: qty 40

## 2023-07-16 MED ORDER — PHENYLEPHRINE 80 MCG/ML (10ML) SYRINGE FOR IV PUSH (FOR BLOOD PRESSURE SUPPORT)
PREFILLED_SYRINGE | INTRAVENOUS | Status: DC | PRN
  Administered 2023-07-16 (×5): 80 ug via INTRAVENOUS

## 2023-07-16 MED ORDER — PROPOFOL 1000 MG/100ML IV EMUL
INTRAVENOUS | Status: AC
  Filled 2023-07-16: qty 200

## 2023-07-16 MED ORDER — OXYCODONE HCL 5 MG PO TABS
5.0000 mg | ORAL_TABLET | Freq: Once | ORAL | Status: AC | PRN
  Administered 2023-07-16: 5 mg via ORAL

## 2023-07-16 MED ORDER — ONDANSETRON HCL 4 MG/2ML IJ SOLN
INTRAMUSCULAR | Status: DC | PRN
  Administered 2023-07-16: 4 mg via INTRAVENOUS

## 2023-07-16 MED ORDER — EPHEDRINE SULFATE-NACL 50-0.9 MG/10ML-% IV SOSY
PREFILLED_SYRINGE | INTRAVENOUS | Status: DC | PRN
  Administered 2023-07-16 (×2): 10 mg via INTRAVENOUS
  Administered 2023-07-16: 5 mg via INTRAVENOUS

## 2023-07-16 MED ORDER — FENTANYL CITRATE (PF) 100 MCG/2ML IJ SOLN
INTRAMUSCULAR | Status: AC
  Filled 2023-07-16: qty 2

## 2023-07-16 MED ORDER — ACETAMINOPHEN 10 MG/ML IV SOLN
INTRAVENOUS | Status: AC
  Filled 2023-07-16: qty 100

## 2023-07-16 MED ORDER — SODIUM CHLORIDE (PF) 0.9 % IJ SOLN
INTRAMUSCULAR | Status: DC | PRN
  Administered 2023-07-16: 60 mL via INTRAMUSCULAR

## 2023-07-16 MED ORDER — FENTANYL CITRATE (PF) 100 MCG/2ML IJ SOLN
INTRAMUSCULAR | Status: DC | PRN
  Administered 2023-07-16: 50 ug via INTRAVENOUS

## 2023-07-16 MED ORDER — BUPIVACAINE-EPINEPHRINE (PF) 0.5% -1:200000 IJ SOLN
INTRAMUSCULAR | Status: DC | PRN
  Administered 2023-07-16 (×2): 10 mL via PERINEURAL

## 2023-07-16 MED ORDER — DEXMEDETOMIDINE HCL IN NACL 80 MCG/20ML IV SOLN
INTRAVENOUS | Status: AC
  Filled 2023-07-16: qty 20

## 2023-07-16 MED ORDER — PROPOFOL 1000 MG/100ML IV EMUL
INTRAVENOUS | Status: AC
  Filled 2023-07-16: qty 100

## 2023-07-16 MED ORDER — MORPHINE SULFATE (PF) 2 MG/ML IV SOLN: 2.0000 mg | INTRAVENOUS | Status: DC | PRN

## 2023-07-16 MED ORDER — HYDROMORPHONE HCL 1 MG/ML IJ SOLN
INTRAMUSCULAR | Status: AC
  Filled 2023-07-16: qty 1

## 2023-07-16 MED ORDER — PHENYLEPHRINE HCL-NACL 20-0.9 MG/250ML-% IV SOLN
INTRAVENOUS | Status: DC | PRN
  Administered 2023-07-16: 50 ug/min via INTRAVENOUS

## 2023-07-16 MED ORDER — 0.9 % SODIUM CHLORIDE (POUR BTL) OPTIME
TOPICAL | Status: DC | PRN
Start: 2023-07-16 — End: 2023-07-16
  Administered 2023-07-16: 500 mL

## 2023-07-16 MED ORDER — CEFAZOLIN SODIUM-DEXTROSE 2-4 GM/100ML-% IV SOLN
INTRAVENOUS | Status: AC
  Filled 2023-07-16: qty 100

## 2023-07-16 MED ORDER — PROPOFOL 10 MG/ML IV BOLUS
INTRAVENOUS | Status: DC | PRN
  Administered 2023-07-16: 100 ug/kg/min via INTRAVENOUS
  Administered 2023-07-16: 120 mg via INTRAVENOUS

## 2023-07-16 MED ORDER — BUPIVACAINE-EPINEPHRINE (PF) 0.5% -1:200000 IJ SOLN
INTRAMUSCULAR | Status: AC
  Filled 2023-07-16: qty 30

## 2023-07-16 MED ORDER — LACTATED RINGERS IV SOLN: INTRAVENOUS | Status: DC | PRN

## 2023-07-16 MED ORDER — DEXAMETHASONE SODIUM PHOSPHATE 10 MG/ML IJ SOLN
INTRAMUSCULAR | Status: DC | PRN
  Administered 2023-07-16: 10 mg via INTRAVENOUS

## 2023-07-16 MED ORDER — OXYCODONE HCL 5 MG PO TABS
10.0000 mg | ORAL_TABLET | ORAL | Status: DC | PRN
  Administered 2023-07-16: 10 mg via ORAL
  Filled 2023-07-16: qty 2

## 2023-07-16 MED ORDER — REMIFENTANIL HCL 1 MG IV SOLR
INTRAVENOUS | Status: DC | PRN
  Administered 2023-07-16: .1 ug/kg/min via INTRAVENOUS

## 2023-07-16 MED ORDER — FENTANYL CITRATE (PF) 100 MCG/2ML IJ SOLN
INTRAMUSCULAR | Status: AC
Start: 2023-07-16 — End: 2023-07-16
  Filled 2023-07-16: qty 2

## 2023-07-16 MED ORDER — OXYCODONE HCL 5 MG PO TABS
ORAL_TABLET | ORAL | Status: AC
  Filled 2023-07-16: qty 1

## 2023-07-16 MED ORDER — SURGIFLO WITH THROMBIN (HEMOSTATIC MATRIX KIT) OPTIME
TOPICAL | Status: DC | PRN
  Administered 2023-07-16 (×2): 1 via TOPICAL

## 2023-07-16 MED ORDER — CEFAZOLIN SODIUM-DEXTROSE 2-4 GM/100ML-% IV SOLN: 2.0000 g | Freq: Three times a day (TID) | INTRAVENOUS | Status: DC

## 2023-07-16 MED ORDER — LIDOCAINE HCL (PF) 2 % IJ SOLN
INTRAMUSCULAR | Status: AC
  Filled 2023-07-16: qty 5

## 2023-07-16 MED ORDER — HYDROMORPHONE HCL 1 MG/ML IJ SOLN
INTRAMUSCULAR | Status: DC | PRN
  Administered 2023-07-16: .2 mg via INTRAVENOUS
  Administered 2023-07-16: .3 mg via INTRAVENOUS

## 2023-07-16 MED ORDER — METHYLPREDNISOLONE ACETATE 40 MG/ML IJ SUSP
INTRAMUSCULAR | Status: AC
  Filled 2023-07-16: qty 1

## 2023-07-16 MED ORDER — BUPIVACAINE HCL (PF) 0.5 % IJ SOLN
INTRAMUSCULAR | Status: AC
  Filled 2023-07-16: qty 30

## 2023-07-16 MED ORDER — MORPHINE SULFATE (PF) 2 MG/ML IV SOLN
2.0000 mg | INTRAVENOUS | Status: DC | PRN
  Administered 2023-07-17: 2 mg via INTRAVENOUS
  Filled 2023-07-16: qty 1

## 2023-07-16 MED ORDER — CEFAZOLIN SODIUM 1 G IJ SOLR
INTRAMUSCULAR | Status: AC
  Filled 2023-07-16: qty 20

## 2023-07-16 MED ORDER — GLYCOPYRROLATE 0.2 MG/ML IJ SOLN
INTRAMUSCULAR | Status: DC | PRN
  Administered 2023-07-16 (×2): .1 mg via INTRAVENOUS

## 2023-07-16 MED ORDER — OXYCODONE HCL 5 MG PO TABS
5.0000 mg | ORAL_TABLET | Freq: Once | ORAL | Status: AC
  Administered 2023-07-16: 5 mg via ORAL

## 2023-07-16 MED ORDER — FENTANYL CITRATE (PF) 100 MCG/2ML IJ SOLN
25.0000 ug | INTRAMUSCULAR | Status: DC | PRN
  Administered 2023-07-16 (×2): 25 ug via INTRAVENOUS
  Administered 2023-07-16: 50 ug via INTRAVENOUS

## 2023-07-16 MED ORDER — SUCCINYLCHOLINE CHLORIDE 200 MG/10ML IV SOSY
PREFILLED_SYRINGE | INTRAVENOUS | Status: DC | PRN
  Administered 2023-07-16: 100 mg via INTRAVENOUS

## 2023-07-16 MED ORDER — DEXMEDETOMIDINE HCL IN NACL 80 MCG/20ML IV SOLN
INTRAVENOUS | Status: DC | PRN
Start: 2023-07-16 — End: 2023-07-16
  Administered 2023-07-16 (×2): 8 ug via INTRAVENOUS

## 2023-07-16 MED ORDER — OXYCODONE HCL 5 MG/5ML PO SOLN: 5.0000 mg | Freq: Once | ORAL | Status: AC | PRN

## 2023-07-16 MED ORDER — ACETAMINOPHEN 10 MG/ML IV SOLN
INTRAVENOUS | Status: DC | PRN
Start: 2023-07-16 — End: 2023-07-16
  Administered 2023-07-16: 1000 mg via INTRAVENOUS

## 2023-07-16 SURGICAL SUPPLY — 74 items
ACP 3 LEVEL PLATE 46MM ×1 IMPLANT
ALLOGRAFT BONE FIBER KORE 5 (Bone Implant) IMPLANT
BASIN KIT SINGLE STR (MISCELLANEOUS) ×2 IMPLANT
BIT DRILL ACP 13 (DRILL) IMPLANT
BIT DRILL NS LF DISP RELINE C (BIT) IMPLANT
BLADE CLIPPER SURG (BLADE) IMPLANT
BRUSH SCRUB EZ 4% CHG (MISCELLANEOUS) ×2 IMPLANT
BUR NEURO DRILL SOFT 3.0X3.8M (BURR) ×2 IMPLANT
CAGE XCORE MINI 12 25-40 (Neuro Prosthesis/Implant) IMPLANT
CAP END 15X12 XCORE MINI 7 BTM (Cap) IMPLANT
DERMABOND ADVANCED .7 DNX12 (GAUZE/BANDAGES/DRESSINGS) ×2 IMPLANT
DRAIN CHANNEL JP 10F RND 20C F (MISCELLANEOUS) IMPLANT
DRAPE C-ARM XRAY 36X54 (DRAPES) ×4 IMPLANT
DRAPE LAPAROTOMY 77X122 PED (DRAPES) ×2 IMPLANT
DRAPE MICROSCOPE SPINE 48X150 (DRAPES) ×1 IMPLANT
DRAPE TABLE BACK 80X90 (DRAPES) ×1 IMPLANT
DRSG OPSITE POSTOP 4X6 (GAUZE/BANDAGES/DRESSINGS) IMPLANT
DRSG TEGADERM 2-3/8X2-3/4 SM (GAUZE/BANDAGES/DRESSINGS) IMPLANT
ELECTRODE REM PT RTRN 9FT ADLT (ELECTROSURGICAL) ×1 IMPLANT
ENDCAP 15X12 XCORE MINI 12 TP (Cap) IMPLANT
EVACUATOR 1/8 PVC DRAIN (DRAIN) IMPLANT
EVACUATOR SILICONE 100CC (DRAIN) IMPLANT
FEE INTRAOP CADWELL SUPPLY NCS (MISCELLANEOUS) IMPLANT
FEE INTRAOP MONITOR IMPULS NCS (MISCELLANEOUS) IMPLANT
GAUZE SPONGE 2X2 STRL 8-PLY (GAUZE/BANDAGES/DRESSINGS) IMPLANT
GLOVE BIOGEL PI IND STRL 7.0 (GLOVE) ×2 IMPLANT
GLOVE BIOGEL PI IND STRL 8 (GLOVE) ×3 IMPLANT
GLOVE SURG SYN 7.0 (GLOVE) ×2 IMPLANT
GLOVE SURG SYN 7.0 PF PI (GLOVE) ×2 IMPLANT
GLOVE SURG SYN 7.5 E (GLOVE) ×3 IMPLANT
GLOVE SURG SYN 7.5 PF PI (GLOVE) ×3 IMPLANT
GOWN SRG LRG LVL 4 IMPRV REINF (GOWNS) ×2 IMPLANT
GOWN SRG XL LVL 3 NONREINFORCE (GOWNS) ×1 IMPLANT
GOWN STRL REUS W/ TWL XL LVL3 (GOWN DISPOSABLE) ×1 IMPLANT
HOLDER FOLEY CATH W/STRAP (MISCELLANEOUS) IMPLANT
KIT TURNOVER KIT A (KITS) ×1 IMPLANT
LAVAGE JET IRRISEPT WOUND (IRRIGATION / IRRIGATOR) ×1 IMPLANT
MANIFOLD NEPTUNE II (INSTRUMENTS) ×2 IMPLANT
MARKER SKIN DUAL TIP RULER LAB (MISCELLANEOUS) ×1 IMPLANT
NS IRRIG 500ML POUR BTL (IV SOLUTION) ×2 IMPLANT
PACK LAMINECTOMY ARMC (PACKS) ×2 IMPLANT
PAD ARMBOARD POSITIONER FOAM (MISCELLANEOUS) ×3 IMPLANT
PIN ACP TEMP FIXATION (EXFIX) IMPLANT
PIN CASPAR 14 (PIN) ×1 IMPLANT
PIN CASPAR 14MM (PIN) ×1 IMPLANT
PIN MAYFIELD SKULL DISP (PIN) IMPLANT
PLATE ACP 46 3LVL (Plate) IMPLANT
ROD RELINE C PB 3.5 X50 (Screw) IMPLANT
SCREW ACP VA ST 3.5X15 (Screw) IMPLANT
SCREW LOCK RELINE C OPEN (Screw) IMPLANT
SCREW MA RELINE C 3.5X14 (Screw) ×5 IMPLANT
SCREW SET END CAP (Screw) IMPLANT
SCREW SP MA RELINE-C 3.5X12 (Screw) IMPLANT
SCREW SP MA RELINE-C 3.5X14 (Screw) IMPLANT
SCREW SPINAL RELINE C 3.5X12 (Screw) ×3 IMPLANT
SCREW XCORE MINI LOCK 12MM (Screw) IMPLANT
SPONGE KITTNER 5P (MISCELLANEOUS) ×1 IMPLANT
STAPLER SKIN PROX 35W (STAPLE) ×1 IMPLANT
SUPPORT INTRAOP PULSE NCS W/E (MISCELLANEOUS) IMPLANT
SURGIFLO W/THROMBIN 8M KIT (HEMOSTASIS) ×2 IMPLANT
SUT STRATA 3-0 30 PS-1 (SUTURE) ×1 IMPLANT
SUT VIC AB 0 CT1 27XCR 8 STRN (SUTURE) ×2 IMPLANT
SUT VIC AB 2-0 CT1 18 (SUTURE) ×2 IMPLANT
SUT VIC AB 3-0 SH 8-18 (SUTURE) ×1 IMPLANT
SUT VICRYL 3-0 CR8 SH (SUTURE) IMPLANT
SUTURE EHLN 3-0 FS-10 30 BLK (SUTURE) IMPLANT
SYR 20ML LL LF (SYRINGE) ×1 IMPLANT
SYR 30ML LL (SYRINGE) ×2 IMPLANT
TAPE CLOTH 3X10 WHT NS LF (GAUZE/BANDAGES/DRESSINGS) ×4 IMPLANT
TRAP FLUID SMOKE EVACUATOR (MISCELLANEOUS) ×2 IMPLANT
TRAY FOLEY SLVR 16FR LF STAT (SET/KITS/TRAYS/PACK) IMPLANT
Weekend Flat Fee ×1 IMPLANT
XCORE MINI ENDCAP 15X12 7 DEGREE ×1 IMPLANT
XCOREMINI CAGE 25-40 ×1 IMPLANT

## 2023-07-16 NOTE — Interval H&P Note (Signed)
 History and Physical Interval Note:  07/16/2023 8:51 AM  Whitney Stuart  has presented today for surgery, with the diagnosis of Progessive Cervical Myelopathy.  The various methods of treatment have been discussed with the patient and family. After consideration of risks, benefits and other options for treatment, the patient has consented to  Procedure(s): C4-5 Anterior Cervical Corpectomy and C3-6 Anterior instrumentation (N/A) C3-C6 Posterior Cervical Decompression and Fusion (N/A) as a surgical intervention.  The patient's history has been reviewed, patient examined, no change in status, stable for surgery.  I have reviewed the patient's chart and labs.  Questions were answered to the patient's satisfaction.    Heart and lungs clear.   [name]

## 2023-07-16 NOTE — Progress Notes (Signed)
 Patient arrived from recovery room. Brought to floor by nurse on bed. Alert but drowsy, on 2 liter of oxygen per Nurse she gave fentanyl . NO complaint of pain at the moment, states numbness to neck. Able to move all extremities, no numbness or tingling to extremity, numbness to upper extremity as per prior to surgery. Family at bed side. Made comfortable in bed

## 2023-07-16 NOTE — Progress Notes (Signed)
 Progress Note   Patient: Whitney Stuart FMW:[id number] DOB: Jun 29, 1950 DOA: 07/13/2023     3 DOS: the patient was seen and examined on 07/16/2023   Brief hospital course: 73 y.o. female with medical history significant of anxiety, atrial fibrillation, hypertension, hyperlipidemia, chronic kidney disease, asthma.  She has been having problems with her legs and arms going on for 1-1/2 to 2 months but really declined over the last 2 weeks.  Both arms have been numb and tingling.  Her legs are jumping and has right leg pain.  On 6/14 she had MRI of the entire spine.  MRI of the cervical spine showing advanced cervical spine disc and posterior element degeneration on multilevel upper cervical spondylolisthesis with severe spinal stenosis with degenerative cord compression at C3-C4 through C5-C6 with myelomalacia.  MRI of the thoracic spine showed spinal stenosis at T6-T7 with mild ventral cord mass effect.  MRI of the lumbar spine showed advanced chronic lumbar spine degeneration with severe chronic disc and posterior element degeneration at L4-L5.  Sent in from neurosurgery office for likely surgery in the next day or so.   Of note, the patient did have a recent hospitalization where her heart enzyme troponin was pretty elevated.  Cardiac catheterization was negative and echocardiogram showed normal EF.  6/19.  Patient still having symptoms with her arms and legs.  Last dose of Eliquis  was on 618 in the morning.  Patient currently on heparin  drip. 6/20.  Neurosurgical procedure for tomorrow 6/21.  Seen before neurosurgical procedure today.  Still feeling the same with her symptoms in her arms and legs.  Heparin  drip stopped around midnight and started on maintenance fluids.   Assessment and Plan: * Cervical myelopathy [facility]) Patient seen before the operating room today on 6/21 by neurosurgery.  As per neurosurgery and surgery will be pretty extensive.  Physical therapy recommending rehab secondary to arm  and leg weakness.  Paroxysmal atrial fibrillation (HCC) Patient on amiodarone  on decrease dose to 200 mg nightly with heart rate being in the 50s.  Eliquis  switched over to heparin  drip on evening of 6/18.  Heparin  drip stopped after midnight on 6/21.  Neurosurgery will have to clear being placed back on blood thinner after neurosurgical procedure.  Stroke risk higher being off blood thinner.  Anxiety Continue Klonopin  and Zoloft .  Essential (primary) hypertension Hold lisinopril  prior to surgery.  Hyperlipidemia, unspecified Continue Crestor   Asthma, chronic Continue inhalers  Thyroid  nodule TSH and free T4 normal range.  Will need thyroid  ultrasound as outpatient.  Overweight (BMI 25.0-29.9) BMI 29.21        Subjective: Patient feeling the same with her arms and leg symptoms.  Admitted for cervical myelopathy.  Physical Exam: Vitals:   07/15/23 1553 07/15/23 1950 07/16/23 0507 07/16/23 0743  BP: (!) 120/52 132/64 (!) 124/52 (!) 136/59  Pulse: 61 64 (!) 48 (!) 51  Resp: 18 18 18 17   Temp: 98.6 F (37 C) 98.5 F (36.9 C) 97.6 F (36.4 C) 98.2 F (36.8 C)  TempSrc:    Oral  SpO2: 99% 97% 99% 97%  Weight:      Height:       Physical Exam HENT:     Head: Normocephalic.   Eyes:     General: Lids are normal.     Conjunctiva/sclera: Conjunctivae normal.    Cardiovascular:     Rate and Rhythm: Regular rhythm. Bradycardia present.     Heart sounds: S1 normal and S2 normal. Murmur heard.  Systolic murmur is present with a grade of 2/6.  Pulmonary:     Breath sounds: No decreased breath sounds, wheezing, rhonchi or rales.  Abdominal:     Palpations: Abdomen is soft.     Tenderness: There is no abdominal tenderness.   Musculoskeletal:     Right lower leg: No swelling.     Left lower leg: No swelling.   Skin:    General: Skin is warm.     Findings: No rash.   Neurological:     Mental Status: She is alert and oriented to person, place, and time.      Data Reviewed: Hemoglobin 9.3  Family Communication: Family at bedside  Disposition: Status is: Inpatient Remains inpatient appropriate because: Operating room today for extensive cervical procedure  Planned Discharge Destination: Rehab    Time spent: 27 minutes  Author: Charlie Patterson, MD 07/16/2023 12:33 PM  For on call review [email or web address].

## 2023-07-16 NOTE — Anesthesia Procedure Notes (Signed)
 Procedure Name: Intubation Date/Time: 07/16/2023 9:15 AM  Performed by: Delores Evalene BROCKS, CRNAPre-anesthesia Checklist: Patient identified, Patient being monitored, Timeout performed, Emergency Drugs available and Suction available Patient Re-evaluated:Patient Re-evaluated prior to induction Oxygen Delivery Method: Circle system utilized Preoxygenation: Pre-oxygenation with 100% oxygen Induction Type: IV induction Ventilation: Mask ventilation without difficulty Laryngoscope Size: 3 and McGrath Grade View: Grade II Tube type: Oral Tube size: 7.0 mm Number of attempts: 1 Airway Equipment and Method: Stylet and Video-laryngoscopy Placement Confirmation: ETT inserted through vocal cords under direct vision, positive ETCO2 and breath sounds checked- equal and bilateral Secured at: 21 cm Tube secured with: Tape Dental Injury: Teeth and Oropharynx as per pre-operative assessment

## 2023-07-16 NOTE — OR Nursing (Signed)
 9072 Periwick removed and 16 fr Foley placed with 600 mls of yellow urine noted at this time.

## 2023-07-16 NOTE — Plan of Care (Signed)
  Problem: Education: Goal: Knowledge of General Education information will improve Description: Including pain rating scale, medication(s)/side effects and non-pharmacologic comfort measures Outcome: Progressing   Problem: Health Behavior/Discharge Planning: Goal: Ability to manage health-related needs will improve Outcome: Progressing   Problem: Clinical Measurements: Goal: Ability to maintain clinical measurements within normal limits will improve Outcome: Progressing   Problem: Activity: Goal: Risk for activity intolerance will decrease Outcome: Progressing   Problem: Coping: Goal: Level of anxiety will decrease Outcome: Progressing   Problem: Elimination: Goal: Will not experience complications related to bowel motility Outcome: Progressing   Problem: Pain Managment: Goal: General experience of comfort will improve and/or be controlled Outcome: Progressing   Problem: Safety: Goal: Ability to remain free from injury will improve Outcome: Progressing   Problem: Skin Integrity: Goal: Risk for impaired skin integrity will decrease Outcome: Progressing

## 2023-07-16 NOTE — Op Note (Signed)
 Indications: 73 year old woman with a history of rapidly progressive cervical myelopathy.  She has been followed in neurosurgery clinic for cervical stenosis and was recommended to have imaging.  On follow-up.  Noticed rapid decline in her extremity weakness, ambulatory status, and her MRI came back with severe stenosis and T2 signal change.  Because of this she was taken to the OR for decompression and fusion Preoperative Diagnosis: Progessive Cervical Myelopathy  Findings: Severe cervical stenosis secondary to calcified disc osteophyte complex and calcified posterior longitudinal ligament, this was causing impingement and compression of the cervical spinal cord.  Well decompressed at the end of the procedure.  Postoperative Diagnosis: Progessive Cervical Myelopathy   EBL: 200 ml IVF: See anesthesia report Drains: Anterior cervical  Disposition: Extubated and Stable to PACU Complications: none  No foley catheter was placed.   Preoperative Note:  Risks of surgery discussed include: infection, bleeding, stroke, coma, death, paralysis, CSF leak, nerve/spinal cord injury, numbness, tingling, weakness, complex regional pain syndrome, recurrent stenosis and/or disc herniation, vascular injury, development of instability, neck/back pain, need for further surgery, persistent symptoms, development of deformity, and the risks of anesthesia. The patient understood these risks and agreed to proceed.  Procedure:  Stage I: 1) Anterior cervical corpectomy, C4 2) anterior cervical corpectomy C5 3) anterior cervical instrumentation C3-C6 4) anterior cervical arthrodesis C3-C6 5) placement of anterior interbody cage 6) allograft harvest local 5) use of operating microscope   Stage II: 1) posterior segmental instrumentation, C3-C6 2) posterior segmental arthrodesis C3-C6, autograft plus allograft 3) autograft harvest   Procedure: After obtaining informed consent, the patient taken to the operating  room, placed in supine position, general anesthesia induced.  The patient had a small shoulder roll placed behind their shoulders.  The patient received preop antibiotics and IV Decadron.  The patient had a neck incision outlined, was prepped and draped in usual sterile fashion. The incision was injected with local anesthetic.   An incision was opened, dissection taken down medial to the carotid artery and jugular vein, lateral to the trachea and esophagus.  The prevertebral fascia identified and a localizing x-ray demonstrated the correct level.  The longus colli were dissected laterally, and self-retaining retractors placed to open the operative field. The microscope was then brought into the field.  With this complete, we turned our attention to the C5 level,.  We are able to identify the disc osteophyte complex and carried this out laterally until we are able to see the anterior border the uncovertebral joint.  We identified this both at the C5-6 level and the C4-5 level.  Once we had this we were able to identify the border of our corpectomy.  Utilizing high-speed drill we performed a discectomy carrying it down to the posterior aspect of the vertebral body at both the C4-5 level and the C5-6 level.  We did this until we are able to see the posterior longitudinal ligament.  Once we had this identified at both levels we then proceeded to perform our corpectomy.  A high-speed drill was utilized and we drilled troughs from uncovertebral joint to uncovertebral joint on the left, and then on the right.  We used a rongeur to remove the bone in between for autograft.  We carried this out until we noticed we are getting to the cortical portion of the posterior bone.  We took over 50% of the C5 body.  We utilized a high-speed drill to slowly take this away until we are able to identify the posterior longitudinal  ligament.  We then utilized an upgoing curette and a nerve hook to get behind the posterior longitudinal  ligament.  This gave us  access to the epidural space.  We disconnected the ligament below and flattened out the superior posterior aspect of the C6 vertebral body, we then utilized a Kerrison rongeur to disconnect the posterior longitudinal ligament at the lateral margins bilaterally.  We then turned our attention to the C4 body.  We were able to identify the disc osteophyte complex and drilled this down until we are able to identify the uncovertebral joint at the C3-4 junction.  We then followed this laterally until we are able to identify this on both sides.  Again a high-speed drill was utilized perform the discectomy.  This was carried to the posterior margin of the vertebral body.  We are able to see the posterior longitudinal ligament at this time.  This was carried out laterally until the uncovertebral joint was noticed bilaterally.  Again having our margins we then utilized a high-speed drill to remove the vertebral body and a trough like fashion laterally and then with rongeurs centrally.  We did this in the same fashion where we brought this down to the level of the posterior longitudinal ligament.  Once the bone was removed which was greater than 50% of the vertebral body of C4 we then were able to utilize a Kerrison rongeur to continue our troughs laterally disconnecting the posterior longitudinal ligament.  We brought this up to the level of the inferior posterior C3 vertebral body.  We utilized a high-speed drill to clean off the posterior margin and decompress the posterior disc space.  At the end of the decompression spinal cord was widely decompressed and we could see CSF pulsations underneath the dura.  At the end of the decompression we continue to monitor our intraoperative neuromonitoring.  There was considerable improvement in the motor signals after the decompression which continued to improve throughout the case.  We then prepared the endplates of the inferior C3 and superior C6 bodies,  the cartilaginous endplates were taken.  Care was taken to maintain the bony structure.  We then placed a expandable cervical corpectomy cage into the defect and under radiographic/fluoroscopic guidance we continued to expanded until we noticed an improvement in her alignment and distraction of her posterior facet structures.  We then irrigated copiously.  We placed a plate to instrument across the 3 C3-C6 levels.  We obtained meticulous hemostasis.  Under fluoroscopic guidance we placed the screws into the body of C3 and C6.  We then final tightened everything into place.  We placed a subfascial drain and closed in multiple layers.  We then took down the drapes and prepared her for the posterior portion of the procedure.  She was put onto a stretcher and then turned prone onto the [place] frame.  We utilized fluoroscopy to evaluate her alignment and to optimize her positioning.  Once this was done we fixed her into place and padded all of her pressure points.  We hooked intraoperative neuromonitoring back up for evaluation.  Her monitoring continue to be stable.  We then prepped and draped her in the usual fashion.  We infiltrated a midline incision above the C3-C6 levels.  Using sharp dissection we took a 10 blade to open the skin.  We used a Bovie to go down to the fascia, the fascia was opened and the single linear fashion and we followed the midline down to the posterior spinous processes.  Were able to palpate C2 and took care not to disconnect this from the erector spinae muscles.  We identified C3 and subperiosteally dissected from C3-C6 exposing the posterior lateral elements.  We are able to clearly identify the lateral masses.  A high-speed drill was brought in to remove osteophyte formation, and to decorticate the joint space.  He did this at every level.  We then under fluoroscopic guidance placed pilot holes in the C3-C6 levels bilaterally.  We initially tapped down to 12 and palpated and then top  down to 14 on all the levels that had continued bony structure.  We placed 14 mm screws at the top 3 levels and 12 mm screws at the bottom levels.  We verified their positioning with fluoroscopy.  Once this was completed we placed rods into the screw heads and fixed them into place.  We final tightened them and took x-rays to ensure they are positioning.   We then utilized a high-speed drill to prepare the posterior lateral elements for arthrodesis.  We decorticated and removed the synovial tissue from the joint spaces.  Irrigated copiously.  We obtained meticulous hemostasis.We harvest spinous process for local autograft and combined it with with allograft.  The allograft was placed into the posterior lateral segments to promote arthrodesis.  Implants: Implant Name Type Inv. Item Serial No. Manufacturer Lot No. LRB No. Used Action  CAP END 15X12 XCORE MINI 7 BTM - [id number] Cap CAP END 15X12 XCORE MINI 7 BTM  [employer]  N/A 1 Implanted  SCREW ACP VA ST 3.5X15 - [id number] Screw SCREW ACP VA ST 3.5X15  [employer]  N/A 4 Implanted  SCREW SET END CAP - [id number] Screw SCREW SET END CAP  [employer]  N/A 2 Implanted  SCREW XCORE MINI LOCK - [id number] Screw SCREW XCORE MINI LOCK  [employer]  N/A 1 Implanted  XCOREMINI CAGE 25-40    [employer]  N/A 1 Implanted  XCORE MINI ENDCAP 15X12 7 DEGREE    [employer]  N/A 1 Implanted  ACP 3 LEVEL PLATE 46MM    [employer]  N/A 1 Implanted  ALLOGRAFT BONE FIBER KORE 5 - [id number] Bone Implant ALLOGRAFT BONE FIBER KORE 5 [id number] MUSCULOSKELETL TRANSPLANT FNDN  N/A 1 Implanted  SCREW LOCK RELINE C OPEN - [id number] Screw SCREW LOCK RELINE C OPEN  [employer]  N/A 8 Implanted  ROD RELINE C PB 3.5 X50 - [id number] Screw ROD RELINE C PB 3.5 X50  [employer]  N/A 2 Implanted  SCREW MA RELINE C 3.5X14 - [id number] Screw SCREW MA RELINE C 3.5X14  [employer]  N/A 5 Implanted  SCREW SPINAL RELINE C  3.5X12 - [id number] Screw SCREW SPINAL RELINE C 3.5X12  [employer]  N/A 3 Implanted   I performed this procedure without an Designer, television/film set.   Penne MICAEL Sharps, MD/MSCR

## 2023-07-16 NOTE — Progress Notes (Signed)
 Patient taken off floor heading to get her procedure done.

## 2023-07-16 NOTE — Anesthesia Preprocedure Evaluation (Signed)
 Anesthesia Evaluation  Patient identified by MRN, date of birth, ID band Patient awake    Reviewed: Allergy & Precautions, NPO status , Patient's Chart, lab work & pertinent test results  History of Anesthesia Complications Negative for: history of anesthetic complications  Airway Mallampati: III  TM Distance: >3 FB Neck ROM: full    Dental  (+) Chipped   Pulmonary neg shortness of breath, asthma , former smoker   Pulmonary exam normal        Cardiovascular Exercise Tolerance: Good hypertension, (-) angina + Past MI and + Peripheral Vascular Disease  Normal cardiovascular exam     Neuro/Psych  PSYCHIATRIC DISORDERS       Neuromuscular disease    GI/Hepatic Neg liver ROS,GERD  Controlled,,  Endo/Other  negative endocrine ROS    Renal/GU Renal disease     Musculoskeletal   Abdominal   Peds  Hematology  (+) Blood dyscrasia, anemia   Anesthesia Other Findings Past Medical History: No date: Arthritis No date: Asthma No date: Asthma No date: Chest pain     Comment:  a. 11/2019 Cor CTA: Calcium  score equals 0.  Normal               coronary arteries. No date: CKD (chronic kidney disease) No date: Diastolic dysfunction     Comment:  a. 11/2019 Echo: EF 60-65%.  Grade 1 diastolic               dysfunction; b. 12/2021 Echo: EF 60-65%, GrI DD; c.               05/2022 Echo: EF 60-65%, no rwma. Nl RV fxn. RVSP               43.68mmHg. Sev dil LA. Mod MR. No date: GERD (gastroesophageal reflux disease) No date: History of cervical cancer No date: Hyperlipidemia No date: Hypertension No date: Mitral regurgitation     Comment:  a. 12/22023 Echo: Mild-mod MR; b. 05/2022 Echo: Mod MR. No date: PAH (pulmonary artery hypertension) (HCC)     Comment:  a. 11/2019 Echo: RVSP ; b .12/2021 Echo: RVSP               39.71mmHg; c. 05/2022 Echo: RVSP 43.65mmHg. No date: Persistent atrial fibrillation (HCC)     Comment:  a. Dx  04/2022-->Eliquis  (CHA2DS2VASc = 3); b. 06/2022 Zio:              predominantly sinus rhythm at a rate of 66 bpm (range               44-154), 2 brief runs of SVT (fastest 154 bpm x 20 beats)              and otherwise rare PACs and PVCs. No date: Sleep-disordered breathing  Past Surgical History: 1999: ABDOMINAL HYSTERECTOMY     Comment:  total 2003: HAND SURGERY; Bilateral     Comment:  carpal tunnel 06/30/2023: LEFT HEART CATH AND CORONARY ANGIOGRAPHY; N/A     Comment:  Procedure: LEFT HEART CATH AND CORONARY ANGIOGRAPHY;                Surgeon: Darron Deatrice LABOR, MD;  Location: [facility] INVASIVE               CV LAB;  Service: Cardiovascular;  Laterality: N/A; No date: LIPOMA EXCISION  BMI    Body Mass Index: 29.21 kg/m      Reproductive/Obstetrics negative OB ROS  Anesthesia Physical Anesthesia Plan  ASA: 3  Anesthesia Plan: General ETT   Post-op Pain Management:    Induction: Intravenous  PONV Risk Score and Plan: Ondansetron , Dexamethasone, Midazolam  and Treatment may vary due to age or medical condition  Airway Management Planned: Oral ETT and Video Laryngoscope Planned  Additional Equipment: Arterial line  Intra-op Plan:   Post-operative Plan: Extubation in OR  Informed Consent: I have reviewed the patients History and Physical, chart, labs and discussed the procedure including the risks, benefits and alternatives for the proposed anesthesia with the patient or authorized representative who has indicated his/her understanding and acceptance.     Dental Advisory Given  Plan Discussed with: Anesthesiologist, CRNA and Surgeon  Anesthesia Plan Comments: (Patient consented for risks of anesthesia including but not limited to:  - adverse reactions to medications - damage to eyes, teeth, lips or other oral mucosa - nerve damage due to positioning  - sore throat or hoarseness - Damage to heart, brain, nerves,  lungs, other parts of body or loss of life  Patient voiced understanding and assent.)       Anesthesia Quick Evaluation

## 2023-07-16 NOTE — Anesthesia Postprocedure Evaluation (Signed)
 Anesthesia Post Note  Patient: MAKYRA CORPREW  Procedure(s) Performed: C4-5 Anterior Cervical Corpectomy, arthrodesis and C3-6 Anterior instrumentation (Neck) C3-C6 Posterior Cervical  Fusion (Neck)  Patient location during evaluation: PACU Anesthesia Type: General Level of consciousness: awake and alert Pain management: pain level controlled Vital Signs Assessment: post-procedure vital signs reviewed and stable Respiratory status: spontaneous breathing, nonlabored ventilation and respiratory function stable Cardiovascular status: blood pressure returned to baseline and stable Postop Assessment: no apparent nausea or vomiting Anesthetic complications: no   No notable events documented.   Last Vitals:  Vitals:   07/16/23 1632 07/16/23 2033  BP: (!) 163/67 (!) 169/70  Pulse: 62 62  Resp: 18 18  Temp: 36.7 C 36.8 C  SpO2: 95% 97%    Last Pain:  Vitals:   07/16/23 2117  TempSrc:   PainSc: 5                  Fairy POUR Oren Barella

## 2023-07-16 NOTE — Transfer of Care (Signed)
 Immediate Anesthesia Transfer of Care Note  Patient: Whitney Stuart  Procedure(s) Performed: C4-5 Anterior Cervical Corpectomy and C3-6 Anterior instrumentation (Neck) C3-C6 Posterior Cervical  Fusion (Neck)  Patient Location: PACU  Anesthesia Type:General  Level of Consciousness: awake, alert , and oriented  Airway & Oxygen Therapy: Patient Spontanous Breathing and Patient connected to face mask oxygen  Post-op Assessment: Report given to RN and Post -op Vital signs reviewed and stable  Post vital signs: Reviewed and stable  Last Vitals:  Vitals Value Taken Time  BP 131/54 07/16/23 14:37  Temp    Pulse 66 07/16/23 14:44  Resp 14 07/16/23 14:44  SpO2 100 % 07/16/23 14:44  Vitals shown include unfiled device data.  Last Pain:  Vitals:   07/16/23 0831  TempSrc:   PainSc: 2          Complications: No notable events documented.

## 2023-07-16 NOTE — Anesthesia Procedure Notes (Signed)
 Arterial Line Insertion Start/End6/21/2025 9:20 AM, 07/16/2023 9:26 AM Performed by: Stevan Fairy POUR, MD, anesthesiologist  Patient location: OR. Preanesthetic checklist: patient identified, IV checked, site marked, risks and benefits discussed, surgical consent, monitors and equipment checked, pre-op evaluation, timeout performed and anesthesia consent Left, radial was placed Catheter size: 20 G Hand hygiene performed  and maximum sterile barriers used   Attempts: 2 Procedure performed using ultrasound guided technique. Ultrasound Notes:anatomy identified, needle tip was noted to be adjacent to the nerve/plexus identified and no ultrasound evidence of intravascular and/or intraneural injection Following insertion, Biopatch. Post procedure assessment: normal and unchanged  Patient tolerated the procedure well with no immediate complications. Additional procedure comments:  Allen's test was performed to ensure adequate perfusion and was appropriate. The patient's wrist was prepped and draped in sterile fashion.  A 20 G Arrow arterial line was introduced into the radial artery. The catheter was threaded over the guide wire and the needle was removed with appropriate pulsatile blood return. The catheter was then secured with a sterile Tegaderm dressing. Perfusion to the extremity distal to the point of catheter insertion was checked and found to be adequate. Attending was present for the entire procedure.  Estimated Blood Loss: minimal  The patient tolerated the procedure well and there were no immediate complications  .

## 2023-07-16 NOTE — Progress Notes (Signed)
 Neurosurgery Post-Procedure Recommendations  Procedure: Anterior cervical corpectomy, C4-5, anterior cervical arthrodesis C3-C6.  Posterior cervical instrumentation and fusion C3-C6.  Neurologic: Continue pain medication as tolerated.  No need for postoperative imaging.  Patient can have a rigid cervical collar while upright, okay for her to be upright prior to delivery. Pulmonary: Encourage incentive spirometry to prevent atelectasis GI: Okay to advance diet as tolerated, expected for patient to have postoperative swallowing pain, if on tolerable discussed with neurosurgery and consider steroid administration. GU: Foley catheter in place, okay to wean as tolerated from neurosurgery standpoint Heme: Okay to initiate prophylactic dosing of anticoagulation tomorrow morning, will be okay to reinitiate heparin  dosing once drains are removed Drains: Anterior cervical drain, follow ouptuts every shift Dispo: Ok to work with PT/OT, expect extended therapy requirements.

## 2023-07-17 DIAGNOSIS — F419 Anxiety disorder, unspecified: Secondary | ICD-10-CM | POA: Diagnosis not present

## 2023-07-17 DIAGNOSIS — I480 Paroxysmal atrial fibrillation: Secondary | ICD-10-CM | POA: Diagnosis not present

## 2023-07-17 DIAGNOSIS — M4712 Other spondylosis with myelopathy, cervical region: Secondary | ICD-10-CM

## 2023-07-17 DIAGNOSIS — I10 Essential (primary) hypertension: Secondary | ICD-10-CM | POA: Diagnosis not present

## 2023-07-17 LAB — CBC
HCT: 31.6 % — ABNORMAL LOW (ref 36.0–46.0)
Hemoglobin: 10.7 g/dL — ABNORMAL LOW (ref 12.0–15.0)
MCH: 32.2 pg (ref 26.0–34.0)
MCHC: 33.9 g/dL (ref 30.0–36.0)
MCV: 95.2 fL (ref 80.0–100.0)
Platelets: 333 10*3/uL (ref 150–400)
RBC: 3.32 MIL/uL — ABNORMAL LOW (ref 3.87–5.11)
RDW: 12.2 % (ref 11.5–15.5)
WBC: 14 10*3/uL — ABNORMAL HIGH (ref 4.0–10.5)
nRBC: 0 % (ref 0.0–0.2)

## 2023-07-17 LAB — TYPE AND SCREEN
ABO/RH(D): A POS
Antibody Screen: NEGATIVE

## 2023-07-17 LAB — BASIC METABOLIC PANEL WITH GFR
Anion gap: 9 (ref 5–15)
BUN: 21 mg/dL (ref 8–23)
CO2: 20 mmol/L — ABNORMAL LOW (ref 22–32)
Calcium: 9 mg/dL (ref 8.9–10.3)
Chloride: 104 mmol/L (ref 98–111)
Creatinine, Ser: 1.04 mg/dL — ABNORMAL HIGH (ref 0.44–1.00)
GFR, Estimated: 57 mL/min — ABNORMAL LOW
Glucose, Bld: 124 mg/dL — ABNORMAL HIGH (ref 70–99)
Potassium: 5.1 mmol/L (ref 3.5–5.1)
Sodium: 134 mmol/L — ABNORMAL LOW (ref 135–145)

## 2023-07-17 LAB — BPAM RBC: Blood Product Expiration Date: 202507212359

## 2023-07-17 MED ORDER — OXYCODONE HCL 5 MG PO TABS
5.0000 mg | ORAL_TABLET | Freq: Four times a day (QID) | ORAL | Status: DC | PRN
  Administered 2023-07-17 – 2023-07-22 (×10): 5 mg via ORAL
  Filled 2023-07-17 (×9): qty 1

## 2023-07-17 MED ORDER — MORPHINE SULFATE (PF) 2 MG/ML IV SOLN
2.0000 mg | INTRAVENOUS | Status: DC | PRN
  Administered 2023-07-17 – 2023-07-19 (×3): 2 mg via INTRAVENOUS
  Filled 2023-07-17 (×3): qty 1

## 2023-07-17 MED ORDER — ENOXAPARIN SODIUM 40 MG/0.4ML IJ SOSY
40.0000 mg | PREFILLED_SYRINGE | INTRAMUSCULAR | Status: DC
  Administered 2023-07-17 – 2023-07-22 (×6): 40 mg via SUBCUTANEOUS
  Filled 2023-07-17 (×6): qty 0.4

## 2023-07-17 MED ORDER — MENTHOL 3 MG MT LOZG
1.0000 | LOZENGE | OROMUCOSAL | Status: DC | PRN
  Administered 2023-07-18 – 2023-07-21 (×3): 3 mg via ORAL
  Filled 2023-07-17 (×2): qty 9

## 2023-07-17 MED ORDER — POLYETHYLENE GLYCOL 3350 17 G PO PACK
17.0000 g | PACK | Freq: Every day | ORAL | Status: DC
  Administered 2023-07-17 – 2023-07-23 (×7): 17 g via ORAL
  Filled 2023-07-17 (×7): qty 1

## 2023-07-17 MED ORDER — OXYCODONE HCL 5 MG PO TABS
10.0000 mg | ORAL_TABLET | ORAL | Status: DC | PRN
  Administered 2023-07-18 – 2023-07-23 (×5): 10 mg via ORAL
  Filled 2023-07-17 (×6): qty 2

## 2023-07-17 NOTE — Progress Notes (Signed)

## 2023-07-17 NOTE — Evaluation (Signed)
 Occupational Therapy Evaluation Patient Details Name: Whitney Stuart MRN: [id number] DOB: 15-Dec-1950 Today's Date: 07/17/2023   History of Present Illness   Pt is a 73 year old female s/p Anterior cervical corpectomy, C4-5, anterior cervical arthrodesis C3-C6.  Posterior cervical instrumentation and fusion C3-C6 6/2; after presenting with ongoign signs of cervical myelopathy  PMH significant for anxiety, atrial fibrillation, hypertension, hyperlipidemia, chronic kidney disease, asthma.     Clinical Impressions Chart reviewed to date, pt greeted semi supine in bed, agreeable to OT evaluation. PTA pt was generally MOD I-I in ADL/IADL amb with no AD and then she reports a progressive decline in mobility/function over the last month where she was requiring assist with ADL, amb with RW. Pt presents with deficits in BUE function, strength, endurance, activity tolerance, balance, affecting safe and optimal ADL completion. Hard collar donned at edge of bed. Bed mobility via log roll completed with MAX A, STS with MOD A with RW, step pivot transfer to bedside chair with MIN-MOD A with RW. MAX A for LB dressing, MIN-MOD A For seated grooming tasks. Weakness/sensation deficits noted in BUE, please see further details below but pt reports they feel improved as compared to pre surgery. Educated pt/family re: role of OT, role of rehab, discharge recommendations, safe ADL completion with DME, importance of continued attempts at mobility (even sitting on edge of bed, attempts at transfer to bsc) with staff to facilitate improved functional outcomes. Pt is left in bedside chair, all needs met. OT will continue to follow to facilitate optimal ADL performance/return to PLOF.      If plan is discharge home, recommend the following:   A lot of help with walking and/or transfers;A lot of help with bathing/dressing/bathroom;Help with stairs or ramp for entrance;Assistance with cooking/housework     Functional Status  Assessment   Patient has had a recent decline in their functional status and demonstrates the ability to make significant improvements in function in a reasonable and predictable amount of time.     Equipment Recommendations   Other (comment) (defer to next venue of care)     Recommendations for Other Services         Precautions/Restrictions   Precautions Precautions: Fall Recall of Precautions/Restrictions: Intact Precaution/Restrictions Comments: hard cervical collar, donn in sitting, ok to walk to bathroom without Required Braces or Orthoses: Cervical Brace Cervical Brace: Hard collar     Mobility Bed Mobility Overal bed mobility: Needs Assistance Bed Mobility: Rolling, Sidelying to Sit Rolling: Mod assist, Max assist, Used rails Sidelying to sit: Max assist, HOB elevated       General bed mobility comments: step by step cueing for body mechanics    Transfers Overall transfer level: Needs assistance Equipment used: Rolling walker (2 wheels) Transfers: Sit to/from Stand Sit to Stand: Mod assist                  Balance Overall balance assessment: Needs assistance Sitting-balance support: Feet supported Sitting balance-Leahy Scale: Fair   Postural control: Posterior lean Standing balance support: Bilateral upper extremity supported, During functional activity, Reliant on assistive device for balance Standing balance-Leahy Scale: Fair                             ADL either performed or assessed with clinical judgement   ADL Overall ADL's : Needs assistance/impaired Eating/Feeding: Minimal assistance;Sitting   Grooming: Wash/dry face;Minimal assistance;Moderate assistance;Sitting  Upper Body Dressing : Maximal assistance;Sitting Upper Body Dressing Details (indicate cue type and reason): collar Lower Body Dressing: Maximal assistance;Sitting/lateral leans Lower Body Dressing Details (indicate cue type and reason): socks  sitting on edge of bed Toilet Transfer: Minimal assistance;Moderate assistance;Rolling walker (2 wheels);Cueing for sequencing Toilet Transfer Details (indicate cue type and reason): step pivot transfer to bedside chair Toileting- Clothing Manipulation and Hygiene: Maximal assistance;Sit to/from stand Toileting - Clothing Manipulation Details (indicate cue type and reason): anticipate             Vision Patient Visual Report: No change from baseline       Perception         Praxis         Pertinent Vitals/Pain Pain Assessment Pain Assessment: 0-10 Pain Score: 2  Pain Location: neck Pain Descriptors / Indicators: Discomfort Pain Intervention(s): Premedicated before session, Monitored during session, Repositioned, Limited activity within patient's tolerance     Extremity/Trunk Assessment Upper Extremity Assessment Upper Extremity Assessment: RUE deficits/detail;LUE deficits/detail;Right hand dominant RUE Deficits / Details: shoulder flexion AROM to approx 90 degrees; elbow/wrist/hand AROM approx 3/4 full AROM; weak grip strength RUE Sensation: decreased light touch RUE Coordination: decreased fine motor;decreased gross motor LUE Deficits / Details: AROM shoulder flexion approx 90 degrees, elbow/wrist/hand AROM grossly WFL; mildly impaired grip strength   Lower Extremity Assessment Lower Extremity Assessment: Defer to PT evaluation   Cervical / Trunk Assessment Cervical / Trunk Assessment: Neck Surgery   Communication Communication Communication: No apparent difficulties   Cognition Arousal: Alert Behavior During Therapy: WFL for tasks assessed/performed (reports feeling sleepy from meds, closes her eyes at times but easily redirected) Cognition: No apparent impairments                               Following commands: Intact       Cueing  General Comments   Cueing Techniques: Verbal cues  JP intact pre/post session; incisions intact pre/post  session; C collar donned throughout mobility; spo2 >90% on RA throughout   Exercises     Shoulder Instructions      Home Living Family/patient expects to be discharged to:: Private residence Living Arrangements: Children;Other relatives Available Help at Discharge: Family;Available PRN/intermittently (daugther is an EMT) Type of Home: House Home Access: Ramped entrance     Home Layout: One level     Bathroom Shower/Tub: Walk-in shower         Home Equipment: Agricultural consultant (2 wheels);Wheelchair - manual;Shower seat          Prior Functioning/Environment Prior Level of Function : Independent/Modified Independent             Mobility Comments: progressive decline since May, prior to that was amb with no AD community distances ADLs Comments: prior to May, MODI-I in ADL/IADL, was requiring assist for ADL/IADL due to ongoing progressive weakness    OT Problem List: Decreased strength;Decreased activity tolerance;Decreased knowledge of use of DME or AE;Impaired UE functional use;Impaired balance (sitting and/or standing);Decreased knowledge of precautions   OT Treatment/Interventions: Self-care/ADL training;DME and/or AE instruction;Therapeutic activities;Balance training;Therapeutic exercise;Energy conservation;Patient/family education;Neuromuscular education      OT Goals(Current goals can be found in the care plan section)   Acute Rehab OT Goals Patient Stated Goal: return to indep PLOF OT Goal Formulation: With patient/family Time For Goal Achievement: 07/31/23 Potential to Achieve Goals: Good ADL Goals Pt Will Perform Grooming: with modified independence;sitting;standing Pt Will Perform Lower Body Dressing:  with modified independence;sitting/lateral leans;sit to/from stand;with adaptive equipment Pt Will Transfer to Toilet: with modified independence;ambulating;bedside commode Pt Will Perform Toileting - Clothing Manipulation and hygiene: with modified  independence;sitting/lateral leans;sit to/from stand   OT Frequency:  Min 3X/week    Co-evaluation              AM-PAC OT 6 Clicks Daily Activity     Outcome Measure Help from another person eating meals?: A Little Help from another person taking care of personal grooming?: A Little Help from another person toileting, which includes using toliet, bedpan, or urinal?: A Lot Help from another person bathing (including washing, rinsing, drying)?: A Lot Help from another person to put on and taking off regular upper body clothing?: A Lot Help from another person to put on and taking off regular lower body clothing?: A Lot 6 Click Score: 14   End of Session Equipment Utilized During Treatment: Gait belt;Rolling walker (2 wheels);Cervical collar Nurse Communication: Mobility status  Activity Tolerance: Patient tolerated treatment well Patient left: in chair;with call bell/phone within reach;with chair alarm set;with family/visitor present  OT Visit Diagnosis: Other abnormalities of gait and mobility (R26.89);Muscle weakness (generalized) (M62.81)                Time: 8990-8955 OT Time Calculation (min): 35 min Charges:  OT General Charges $OT Visit: 1 Visit OT Evaluation $OT Eval Moderate Complexity: 1 Mod Therisa Sheffield, OTD OTR/L  07/17/23, 11:16 AM

## 2023-07-17 NOTE — TOC Initial Note (Signed)
 Transition of Care [facility]) - Initial/Assessment Note    Patient Details  Name: Whitney Stuart MRN: [id number] Date of Birth: 06-08-1950  Transition of [facility]) CM/SW Contact:    Esli Jernigan E Nura Cahoon, LCSW Phone Number: 07/17/2023, 9:59 AM  Clinical Narrative:                 CSW spoke with patient's daughter [name] by phone. Patient lives with her daughter Gustav [name] lives nearby. Daughters are involved and supportive. PCP is Dr. Myrla. [facility]. Patient has a rollator, rolling walker, wheelchair, and accessible bathroom. Patient is able to drive but chooses not to, typically her friends provide transport per [name].  CSW explained PT rec for STR at a SNF. [name] states they have already reached out to [place] in Admissions at [facility] who reported she could take patient for STR. Confirmed patient does not live at [facility]. [name] states their second choice would be [facility]. [name] states Gustav works calls (as an EMT) at both of those facilities frequently so should be able to get patient into them.  CSW called Suzen at [facility], left a VM requesting return call to weekday [facility] to confirm if they can take patient for STR.    Expected Discharge Plan: Skilled Nursing Facility Barriers to Discharge: Continued Medical Work up   Patient Goals and CMS Choice Patient states their goals for this hospitalization and ongoing recovery are:: SNF STR CMS Medicare.gov Compare Post Acute Care list provided to:: Patient Represenative (must comment) Choice offered to / list presented to : Adult Children      Expected Discharge Plan and Services       Living arrangements for the past 2 months: Single Family Home                                      Prior Living Arrangements/Services Living arrangements for the past 2 months: Single Family Home Lives with:: Adult Children Patient language and need for interpreter reviewed:: Yes Do you feel safe going back to the  place where you live?: Yes      Need for Family Participation in Patient Care: Yes (Comment) Care giver support system in place?: Yes (comment) Current home services: DME Criminal Activity/Legal Involvement Pertinent to Current Situation/Hospitalization: No - Comment as needed  Activities of Daily Living   ADL Screening (condition at time of admission) Independently performs ADLs?: Yes (appropriate for developmental age) Is the patient deaf or have difficulty hearing?: No Does the patient have difficulty seeing, even when wearing glasses/contacts?: No Does the patient have difficulty concentrating, remembering, or making decisions?: No  Permission Sought/Granted Permission sought to share information with : Oceanographer granted to share information with : Yes, Verbal Permission Granted     Permission granted to share info w AGENCY: [place], [place]        Emotional Assessment       Orientation: : Oriented to Self, Oriented to Place, Oriented to  Time, Oriented to Situation Alcohol / Substance Use: Not Applicable Psych Involvement: No (comment)  Admission diagnosis:  Myelopathy (HCC) [G95.9] Cervical myelopathy (HCC) [G95.9] Patient Active Problem List   Diagnosis Date Noted   Myelopathy due to cervical spondylosis at multiple levels 07/16/2023   Cervical stenosis of spinal canal 07/16/2023   Decreased ambulation status 07/16/2023   Upper extremity weakness 07/16/2023   Lower extremity weakness 07/16/2023  Thyroid  nodule 07/14/2023   Elevated troponin 07/14/2023   Cervical myelopathy (HCC) 07/13/2023   Paroxysmal atrial fibrillation (HCC) 07/13/2023   Anxiety 07/13/2023   Overweight (BMI 25.0-29.9) 07/13/2023   Cervical radiculopathy 07/11/2023   Demand ischemia (HCC) 06/30/2023   NSTEMI (non-ST elevated myocardial infarction) (HCC) 06/29/2023   Atrial fibrillation with rapid ventricular response (HCC) 06/29/2023   Polyneuropathy  06/29/2023   Pain of right thigh 04/06/2022   Chronic fatigue 08/18/2021   GAD (generalized anxiety disorder) 07/17/2020   Diastolic dysfunction    Chest pain    PAH (pulmonary artery hypertension) (HCC)    Sleep-disordered breathing    Mitral regurgitation    Hyperlipidemia, unspecified    DOE (dyspnea on exertion) 11/27/2019   Chronic kidney disease, stage 3a (HCC) 10/25/2019   Complicated grief 09/18/2019   Senile purpura (HCC) 06/01/2019   Seasonal allergic rhinitis 05/15/2019   Asymptomatic microscopic hematuria 09/06/2017   Muscle spasms of neck 03/09/2017   Obesity (BMI 30.0-34.9) 02/09/2017   Calculus of gallbladder 11/21/2014   Arthralgia of hip 11/21/2014   Insomnia 11/21/2014   Asthma, chronic 03/28/2008   Essential (primary) hypertension 03/28/2008   Personal history of malignant neoplasm of cervix uteri 03/28/2008   PCP:  Myrla Jon HERO, MD [facility]   [phone] [place], [place] - [address] AT [facility] [address] [place] [address] Phone: [phone] Fax: [phone]  [id number] [place] [place] [facility] [address] [place] [address] Phone: [phone] Fax: [phone]  [facility] - [place], [place] - [address] [place] [address] Phone: [phone] Fax: [phone]  [facility] [address] [place] [address] Phone: [phone] Fax: [phone]     Social Drivers of Health (SDOH) Social History: SDOH Screenings   Food Insecurity: No Food Insecurity (07/13/2023)  Housing: Low Risk  (07/13/2023)  Transportation Needs: No Transportation Needs (07/13/2023)  Utilities: Not At Risk (07/13/2023)  Alcohol Screen: Low Risk  (12/28/2022)  Depression (PHQ2-9): Medium Risk (07/11/2023)  Financial Resource Strain: Low Risk  (12/28/2022)  Physical Activity: Insufficiently Active  (12/28/2022)  Social Connections: Moderately Isolated (07/13/2023)  Stress: No Stress Concern Present (12/28/2022)  Tobacco Use: Medium Risk (07/13/2023)  Health Literacy: Adequate Health Literacy (12/28/2022)   SDOH Interventions:     Readmission Risk Interventions     No data to display

## 2023-07-17 NOTE — [place] FL2 (Signed)
 [place]  MEDICAID FL2 LEVEL OF CARE FORM     IDENTIFICATION  Patient Name: Whitney Stuart Birthdate: December 22, 1950 Sex: female Admission Date (Current Location): 07/13/2023  [facility] and [place] Number:  Chiropodist and Address:  [facility], [address] [place], [place] [address]      Provider Number: [id number]  Attending Physician Name and Address:  Whitney Ade, MD  Relative Name and Phone Number:  Whitney Stuart (Daughter)  [phone] (Mobile)    Current Level of Care: Hospital Recommended Level of Care: Skilled Nursing Facility Prior Approval Number:    Date Approved/Denied:   PASRR Number: [id number]  Discharge Plan:      Current Diagnoses: Patient Active Problem List   Diagnosis Date Noted   Myelopathy due to cervical spondylosis at multiple levels 07/16/2023   Cervical stenosis of spinal canal 07/16/2023   Decreased ambulation status 07/16/2023   Upper extremity weakness 07/16/2023   Lower extremity weakness 07/16/2023   Thyroid  nodule 07/14/2023   Elevated troponin 07/14/2023   Cervical myelopathy (HCC) 07/13/2023   Paroxysmal atrial fibrillation (HCC) 07/13/2023   Anxiety 07/13/2023   Overweight (BMI 25.0-29.9) 07/13/2023   Cervical radiculopathy 07/11/2023   Demand ischemia (HCC) 06/30/2023   NSTEMI (non-ST elevated myocardial infarction) (HCC) 06/29/2023   Atrial fibrillation with rapid ventricular response (HCC) 06/29/2023   Polyneuropathy 06/29/2023   Pain of right thigh 04/06/2022   Chronic fatigue 08/18/2021   GAD (generalized anxiety disorder) 07/17/2020   Diastolic dysfunction    Chest pain    PAH (pulmonary artery hypertension) (HCC)    Sleep-disordered breathing    Mitral regurgitation    Hyperlipidemia, unspecified    DOE (dyspnea on exertion) 11/27/2019   Chronic kidney disease, stage 3a (HCC) 10/25/2019   Complicated grief 09/18/2019   Senile purpura (HCC) 06/01/2019   Seasonal allergic  rhinitis 05/15/2019   Asymptomatic microscopic hematuria 09/06/2017   Muscle spasms of neck 03/09/2017   Obesity (BMI 30.0-34.9) 02/09/2017   Calculus of gallbladder 11/21/2014   Arthralgia of hip 11/21/2014   Insomnia 11/21/2014   Asthma, chronic 03/28/2008   Essential (primary) hypertension 03/28/2008   Personal history of malignant neoplasm of cervix uteri 03/28/2008    Orientation RESPIRATION BLADDER Height & Weight     Self, Time, Situation, Place  O2 (2L) External catheter Weight: 181 lb (82.1 kg) Height:  5' 6 (167.6 cm)  BEHAVIORAL SYMPTOMS/MOOD NEUROLOGICAL BOWEL NUTRITION STATUS      Continent Diet (regular)  AMBULATORY STATUS COMMUNICATION OF NEEDS Skin   Limited Assist Verbally Surgical wounds (cervical - honeycomb dressing)                       Personal Care Assistance Level of Assistance  Bathing, Dressing, Feeding Bathing Assistance: Limited assistance Feeding assistance: Limited assistance Dressing Assistance: Limited assistance     Functional Limitations Info  Sight Sight Info: Impaired        SPECIAL CARE FACTORS FREQUENCY  PT (By licensed PT), OT (By licensed OT)     PT Frequency: 5 times per week OT Frequency: 5 times per week            Contractures      Additional Factors Info  Code Status, Allergies Code Status Info: full Allergies Info: Methylprednisolone            Current Medications (07/17/2023):  This is the current hospital active medication list Current Facility-Administered Medications  Medication Dose Route Frequency Provider Last Rate Last Admin  acetaminophen  (TYLENOL ) tablet 650 mg  650 mg Oral Q6H PRN [name] ORN, MD   650 mg at 07/15/23 2259   Or   acetaminophen  (TYLENOL ) suppository 650 mg  650 mg Rectal Q6H PRN [name] ORN, MD       albuterol  (PROVENTIL ) (2.5 MG/3ML) 0.083% nebulizer solution 2.5 mg  2.5 mg Nebulization Q6H PRN [name] ORN, MD   2.5 mg at 07/15/23 1146   amiodarone  (PACERONE )  tablet 200 mg  200 mg Oral QHS [name] ORN, MD   200 mg at 07/16/23 2119   clonazePAM  (KLONOPIN ) tablet 0.5 mg  0.5 mg Oral BID PRN [name] ORN, MD   0.5 mg at 07/16/23 1825   cyclobenzaprine  (FLEXERIL ) tablet 5 mg  5 mg Oral TID PRN [name] ORN, MD   5 mg at 07/17/23 0714   enoxaparin (LOVENOX) injection 40 mg  40 mg Subcutaneous Q24H Whitney Ade, MD       fluticasone  furoate-vilanterol (BREO ELLIPTA ) 200-25 MCG/ACT 1 puff  1 puff Inhalation Daily [name] ORN, MD   1 puff at 07/17/23 9045   lisinopril  (ZESTRIL ) tablet 10 mg  10 mg Oral Daily [name] ORN, MD   10 mg at 07/17/23 0957   morphine (PF) 2 MG/ML injection 2 mg  2 mg Intravenous Q3H PRN Whitney Ade, MD       nitroGLYCERIN  (NITROSTAT ) SL tablet 0.4 mg  0.4 mg Sublingual Q5 min PRN [name] ORN, MD       oxyCODONE  (Oxy IR/ROXICODONE ) immediate release tablet 10 mg  10 mg Oral Q4H PRN Whitney Ade, MD       oxyCODONE  (Oxy IR/ROXICODONE ) immediate release tablet 5 mg  5 mg Oral Q6H PRN Whitney Ade, MD   5 mg at 07/17/23 9045   rosuvastatin  (CRESTOR ) tablet 15 mg  15 mg Oral Daily [name] ORN, MD   15 mg at 07/17/23 0957   sertraline  (ZOLOFT ) tablet 100 mg  100 mg Oral Daily [name], [name], MD   100 mg at 07/15/23 9070     Discharge Medications: Please see discharge summary for a list of discharge medications.  Relevant Imaging Results:  Relevant Lab Results:   Additional Information SS #: [id number]  Whitney Stuart E Whitney Bourgoin, LCSW

## 2023-07-17 NOTE — Progress Notes (Signed)
 prognosis       CROSS COVER NOTE  NAME: Whitney Stuart DOBOSZ MRN: [id number] DOB : [birth year]    Concern as stated by nurse / staff   Pt s/p cervical spine surger 07-16-23 and sts her neck feels tight and difficulty swallowing, Ive measured her neck at 46cm for a baseline. She's requesting Chloraseptic lozenges (not the spray)      Pertinent findings on chart review: 6/22. Postoperative day 1 for anterior cervical corpectomy, C4-5; anterior cervical arthrodesis C3-C6. Posterior cervical instrumentation and fusion C3-C6   Patient Assessment   Assessment and  Interventions   Assessment:  Cervical myelopathy s/p neck surgery as above Sore throat  Plan: X X X

## 2023-07-17 NOTE — Progress Notes (Signed)
 Attending Progress Note  History: MELENY TREGONING is here for Procedure(s) (LRB): C4-5 Anterior Cervical Corpectomy, arthrodesis and C3-6 Anterior instrumentation (N/A) C3-C6 Posterior Cervical  Fusion (N/A), they are currently 1 Day Post-Op.   POD1: Overall doing well.  Improvement in her left upper extremity strength.  Physical Exam: Vitals:   07/17/23 0715 07/17/23 1118  BP: (!) 155/65   Pulse: 64   Resp: 18   Temp: 98.4 F (36.9 C)   SpO2: 100% 95%    AA Ox3 CNI  Strength: Show significant improvement in her left hand grip.  Her left upper extremity strength overall is improving.  Data:  Recent Labs  Lab 07/13/23 1150 07/15/23 0515 07/17/23 0619  NA 138 141 134*  K 4.9 4.6 5.1  CL 107 109 104  CO2 23 24 20*  BUN 22 23 21   CREATININE 1.24* 1.22* 1.04*  GLUCOSE 137* 91 124*  CALCIUM  9.1 8.7* 9.0   Recent Labs  Lab 07/13/23 1150  AST 11*  ALT 14  ALKPHOS 54     Recent Labs  Lab 07/15/23 0515 07/16/23 1100 07/17/23 0619  WBC 6.3 6.4 14.0*  HGB 10.0* 9.3* 10.7*  HCT 30.6* 28.0* 31.6*  PLT 312 296 333   Recent Labs  Lab 07/13/23 1647 07/14/23 0600 07/16/23 0340  APTT 33   < > 31  INR 1.3*  --   --    < > = values in this interval not displayed.        DG Cervical Spine 2-3 Views Result Date: 07/16/2023 CLINICAL DATA:  Cervical fusion EXAM: CERVICAL SPINE - 2-3 VIEW COMPARISON:  07/13/2023 FLUOROSCOPY TIME:  Radiation Exposure Index (as provided by the fluoroscopic device): 1.32 mGy If the device does not provide the exposure index: Fluoroscopy Time:  16 seconds Number of Acquired Images:  8 FINDINGS: Initial images demonstrate localization at the C3-4 interspace anteriorly. Corpectomy at C4 and C5 was performed with spacer placement subsequently, the interbody fusion from C3 to C6 is noted. Subsequent posterior exposure was performed with fusion extending from C3-C6. IMPRESSION: Anterior and posterior fusion from C3-C6. Electronically Signed   By:  Oneil Devonshire M.D.   On: 07/16/2023 18:35   DG C-Arm 1-60 Min-No Report Result Date: 07/16/2023 Fluoroscopy was utilized by the requesting physician.  No radiographic interpretation.   DG C-Arm 1-60 Min-No Report Result Date: 07/16/2023 Fluoroscopy was utilized by the requesting physician.  No radiographic interpretation.   DG C-Arm 1-60 Min-No Report Result Date: 07/16/2023 Fluoroscopy was utilized by the requesting physician.  No radiographic interpretation.   DG C-Arm 1-60 Min-No Report Result Date: 07/16/2023 Fluoroscopy was utilized by the requesting physician.  No radiographic interpretation.    Other tests/results: None  Assessment/Plan:  Montie JONETTA Sharps postoperative day 1 from a anterior cervical corpectomy and posterior cervical fusion.  - Drains: Anterior drain present, continue to follow - mobilize - pain control - DVT prophylaxis - PTOT  Penne MICAEL Sharps, MD/MSCR Department of Neurosurgery

## 2023-07-17 NOTE — Progress Notes (Signed)
 Progress Note   Patient: Whitney Stuart FMW:[id number] DOB: 11-15-1950 DOA: 07/13/2023     4 DOS: the patient was seen and examined on 07/17/2023   Brief hospital course: 73 y.o. female with medical history significant of anxiety, atrial fibrillation, hypertension, hyperlipidemia, chronic kidney disease, asthma.  She has been having problems with her legs and arms going on for 1-1/2 to 2 months but really declined over the last 2 weeks.  Both arms have been numb and tingling.  Her legs are jumping and has right leg pain.  On 6/14 she had MRI of the entire spine.  MRI of the cervical spine showing advanced cervical spine disc and posterior element degeneration on multilevel upper cervical spondylolisthesis with severe spinal stenosis with degenerative cord compression at C3-C4 through C5-C6 with myelomalacia.  MRI of the thoracic spine showed spinal stenosis at T6-T7 with mild ventral cord mass effect.  MRI of the lumbar spine showed advanced chronic lumbar spine degeneration with severe chronic disc and posterior element degeneration at L4-L5.  Sent in from neurosurgery office for likely surgery in the next day or so.   Of note, the patient did have a recent hospitalization where her heart enzyme troponin was pretty elevated.  Cardiac catheterization was negative and echocardiogram showed normal EF.  6/19.  Patient still having symptoms with her arms and legs.  Last dose of Eliquis  was on 618 in the morning.  Patient currently on heparin  drip. 6/20.  Neurosurgical procedure for tomorrow 6/21.  Seen before neurosurgical procedure today.  Still feeling the same with her symptoms in her arms and legs.  Heparin  drip stopped around midnight and started on maintenance fluids. 6/22.  Postoperative day 1 for anterior cervical corpectomy, C4-5; anterior cervical arthrodesis C3-C6.  Posterior cervical instrumentation and fusion C3-C6.   Assessment and Plan: Cervical myelopathy (HCC) Left lower and upper  extremity weakness and incoordination.  Postoperative day 1 for anterior cervical corpectomy on C4-5, anterior cervical arthrodesis C3-C6.  Posterior cervical instrumentation and fusion of C3-C6.  PT and OT consultations.  Referral to acute inpatient rehab.  Pain control with IV and oral medications.  Explained to the patient that I am not going to make her pain go down to 0 but she needs to be alert enough to eat and work with physical therapy.  Paroxysmal atrial fibrillation (HCC) Patient on amiodarone  on decrease dose to 200 mg nightly with heart rate being in the 50s.  Eliquis  switched over to heparin  drip on evening of 6/18.  Heparin  drip stopped after midnight on 6/21.  Patient will go on DVT prophylactics Lovenox today.  Stroke risk higher being off blood thinner.  Anxiety Continue Klonopin  and Zoloft .  Essential (primary) hypertension Continue lisinopril .  Hyperlipidemia, unspecified Continue Crestor   Asthma, chronic Continue inhalers  Thyroid  nodule TSH and free T4 normal range.  Will need thyroid  ultrasound as outpatient.  Overweight (BMI 25.0-29.9) BMI 29.21        Subjective: Patient in a lot of pain.  Pain medications are making her sleepy.  I explained to her that she will need to be awake and alert in order to participate well with physical therapy and in order to eat.  She is doing better with moving her arms and legs than prior to the surgery.  Admitted with cervical myelopathy.  Physical Exam: Vitals:   07/17/23 0545 07/17/23 0615 07/17/23 0715 07/17/23 1118  BP: (!) 177/73 (!) 155/66 (!) 155/65   Pulse: 67  64   Resp: 16  18   Temp: (!) 97.5 F (36.4 C)  98.4 F (36.9 C)   TempSrc:   Oral   SpO2: 100%  100% 95%  Weight:      Height:       Physical Exam HENT:     Head: Normocephalic.   Eyes:     General: Lids are normal.     Conjunctiva/sclera: Conjunctivae normal.    Cardiovascular:     Rate and Rhythm: Regular rhythm. Bradycardia present.      Heart sounds: S1 normal and S2 normal. Murmur heard.     Systolic murmur is present with a grade of 2/6.  Pulmonary:     Breath sounds: No decreased breath sounds, wheezing, rhonchi or rales.  Abdominal:     Palpations: Abdomen is soft.     Tenderness: There is no abdominal tenderness.   Musculoskeletal:     Right lower leg: No swelling.     Left lower leg: No swelling.   Skin:    General: Skin is warm.     Findings: No rash.   Neurological:     Mental Status: She is alert and oriented to person, place, and time.     Comments: Able to straight leg raise bilaterally.  Able to move her arms but coordination with her fingers bilaterally is still a little bit impaired    Data Reviewed: Sodium 134, creatinine 1.04, CO2 20, white blood count 14, hemoglobin 10.7, platelet count 333  Family Communication: Family at bedside  Disposition: Status is: Inpatient Remains inpatient appropriate because: Postoperative day 1 for neurosurgical procedure  Planned Discharge Destination: Possible acute inpatient rehab    Time spent: 28 minutes Case discussed with TOC, neurosurgery, OT PT and nursing staff.  Author: Charlie Patterson, MD 07/17/2023 12:49 PM  For on call review [email or web address].

## 2023-07-17 NOTE — Evaluation (Signed)
 Physical Therapy Evaluation Patient Details Name: Whitney Stuart MRN: [id number] DOB: [birth year] Today's Date: 07/17/2023  History of Present Illness  Whitney Stuart is a 73yoF who [facility]13[facility]23 due to ongoing signs of cervical myelopathy, progressive weakness, leg buckling, grip weakness and paresthesias. Pt followed by neurosurgery as an outpatient. Was admitted 6/3-6/5 for AF c RVR, noted polyneuropathy at that time due to cervical spine disease. MRI 07/09/23 IMPRESSION: 1. Advanced cervical spine disc and posterior element degeneration superimposed on multilevel upper cervical spondylolisthesis. Subsequent multifactorial moderate and severe spinal stenosis with Degenerative Cord Compression C3-C4 through C5-C6. Subtle evidence of associated Spinal Cord Myelomalacia at those levels. 2. Mild cervical spinal stenosis elsewhere. Associated up to severe degenerative neural foraminal stenosis at the left C3, bilateral C4, right C5, bilateral C6, and left C7 nerve levels.  PMH: NSTEMI, AF c RVR, HTN, CKD3a, MR, HFpEF. Pt went to OR on 07/15/23 with Dr. Penne Sharps, has orders for hard collar when upright or OOB.  Clinical Impression  Pt in chair on arrival s/p OT evaluation. Pt appears quite somnolent, but is awake during my assessment. Cervical collar donned since up to chair. Pt much weaker today compared to prior to surgery 2 days ago, requires more physical assistance to rise to stranding and has significant difficulty with achieving and maintaining standing balance with a RW. Pt began to have uncontrolled anterior trunk sway but despite a few shuffle steps in attempt to arrest, pt sustain complete anterior LOB, requiring totalA from author to arrest fall against bed. Pt assisted to a stable short sitting position with ModA, then maxA return to supine. Pt elevated slightly, collar removed for comfort. 2 family in room during my visit. Will continue to follow.       If plan is discharge home,  recommend the following: Assist for transportation;Assistance with cooking/housework;Help with stairs or ramp for entrance   Can travel by private vehicle   No    Equipment Recommendations BSC/3in1  Recommendations for Other Services       Functional Status Assessment Patient has had a recent decline in their functional status and demonstrates the ability to make significant improvements in function in a reasonable and predictable amount of time.     Precautions / Restrictions Precautions Precautions: Fall Recall of Precautions/Restrictions: Intact Precaution/Restrictions Comments: when upright or OOB Cervical Brace: Hard collar Restrictions Weight Bearing Restrictions Per Provider Order: No      Mobility  Bed Mobility                    Transfers Overall transfer level: Needs assistance Equipment used: Rolling walker (2 wheels)   Sit to Stand: Min assist           General transfer comment: unable to achieve balance first time, then quick LOB 2nd time after ~5 seconds upright steady    Ambulation/Gait Ambulation/Gait assistance:  (unsafe to attempt;)                Stairs            Wheelchair Mobility     Tilt Bed    Modified Rankin (Stroke Patients Only)       Balance             Standing balance-Leahy Scale: Zero                               Pertinent Vitals/Pain Pain Assessment  Pain Assessment: 0-10 Pain Score: 4  Pain Location: neck Pain Intervention(s): Limited activity within patient's tolerance, Premedicated before session    Home Living Family/patient expects to be discharged to:: Private residence Living Arrangements: Children;Other relatives Available Help at Discharge: Family;Available PRN/intermittently (daugther is an EMT) Type of Home: House Home Access: Ramped entrance       Home Layout: One level Home Equipment: Agricultural consultant (2 wheels);Wheelchair - manual;Shower seat      Prior  Function Prior Level of Function : Independent/Modified Independent             Mobility Comments: progressive decline since May, prior to that was amb with no AD community distances ADLs Comments: prior to May, MODI-I in ADL/IADL, was requiring assist for ADL/IADL due to ongoing progressive weakness     Extremity/Trunk Assessment   Upper Extremity Assessment Upper Extremity Assessment: RUE deficits/detail;LUE deficits/detail;Right hand dominant RUE Deficits / Details: shoulder flexion AROM to approx 90 degrees; elbow/wrist/hand AROM approx 3/4 full AROM; weak grip strength RUE Sensation: decreased light touch RUE Coordination: decreased fine motor;decreased gross motor LUE Deficits / Details: AROM shoulder flexion approx 90 degrees, elbow/wrist/hand AROM grossly WFL; mildly impaired grip strength    Lower Extremity Assessment Lower Extremity Assessment: Defer to PT evaluation    Cervical / Trunk Assessment Cervical / Trunk Assessment: Neck Surgery  Communication   Communication Communication: No apparent difficulties    Cognition Arousal: Lethargic Behavior During Therapy: Flat affect   PT - Cognitive impairments: History of cognitive impairments                                 Cueing       General Comments General comments (skin integrity, edema, etc.): JP intact pre/post session; incisions intact pre/post session; C collar donned throughout mobility; spo2 >90% on RA throughout    Exercises General Exercises - Lower Extremity Long Arc Quad: AROM, Both, 10 reps, Seated   Assessment/Plan    PT Assessment Patient needs continued PT services  PT Problem List Decreased strength;Decreased range of motion;Decreased activity tolerance;Decreased balance;Decreased mobility;Decreased knowledge of precautions;Decreased safety awareness       PT Treatment Interventions DME instruction;Gait training;Stair training;Functional mobility training;Therapeutic  activities;Therapeutic exercise;Balance training;Patient/family education    PT Goals (Current goals can be found in the Care Plan section)  Acute Rehab PT Goals Patient Stated Goal: regain mobility stop falling episodes PT Goal Formulation: With patient Time For Goal Achievement: 07/31/23 Potential to Achieve Goals: Fair    Frequency 7X/week     Co-evaluation               AM-PAC PT 6 Clicks Mobility  Outcome Measure Help needed turning from your back to your side while in a flat bed without using bedrails?: A Lot Help needed moving from lying on your back to sitting on the side of a flat bed without using bedrails?: A Lot Help needed moving to and from a bed to a chair (including a wheelchair)?: A Lot Help needed standing up from a chair using your arms (e.g., wheelchair or bedside chair)?: A Lot Help needed to walk in hospital room?: Total Help needed climbing 3-5 steps with a railing? : Total 6 Click Score: 10    End of Session   Activity Tolerance: Patient limited by lethargy Patient left: with family/visitor present;in bed;with call bell/phone within reach Nurse Communication: Mobility status PT Visit Diagnosis: Difficulty in walking, not elsewhere  classified (R26.2);Unsteadiness on feet (R26.81);Other abnormalities of gait and mobility (R26.89);Muscle weakness (generalized) (M62.81);Other symptoms and signs involving the nervous system (R29.898)    Time: 8961-8898 PT Time Calculation (min) (ACUTE ONLY): 23 min   Charges:   PT Evaluation $PT Re-evaluation: 1 Re-eval   PT General Charges $$ ACUTE PT VISIT: 1 Visit        11:28 AM, 07/17/23 Peggye JAYSON Linear, PT, DPT Physical Therapist - [facility]  [phone] (ASCOM)    Janari Yamada C 07/17/2023, 11:24 AM

## 2023-07-18 ENCOUNTER — Encounter: Payer: Self-pay | Admitting: Neurosurgery

## 2023-07-18 ENCOUNTER — Inpatient Hospital Stay

## 2023-07-18 DIAGNOSIS — I10 Essential (primary) hypertension: Secondary | ICD-10-CM | POA: Diagnosis not present

## 2023-07-18 DIAGNOSIS — M4712 Other spondylosis with myelopathy, cervical region: Secondary | ICD-10-CM | POA: Diagnosis not present

## 2023-07-18 DIAGNOSIS — I480 Paroxysmal atrial fibrillation: Secondary | ICD-10-CM | POA: Diagnosis not present

## 2023-07-18 DIAGNOSIS — F419 Anxiety disorder, unspecified: Secondary | ICD-10-CM | POA: Diagnosis not present

## 2023-07-18 MED ORDER — DEXAMETHASONE SODIUM PHOSPHATE 10 MG/ML IJ SOLN
8.0000 mg | Freq: Once | INTRAMUSCULAR | Status: AC
  Administered 2023-07-18: 8 mg via INTRAVENOUS
  Filled 2023-07-18: qty 1

## 2023-07-18 NOTE — Care Management Important Message (Signed)
 Important Message  Patient Details  Name: Whitney Stuart MRN: [id number] Date of Birth: [birth year]   Important Message Given:  Yes - Medicare IM     Kaisyn Millea W, CMA 07/18/2023, 3:00 PM

## 2023-07-18 NOTE — Progress Notes (Signed)
 Occupational Therapy Treatment Patient Details Name: Whitney Stuart MRN: [id number] DOB: Jun 23, 1950 Today's Date: 07/18/2023   History of present illness Whitney Stuart is a 73yoF who comes to [facility] 07/13/23 due to ongoing signs of cervical myelopathy, progressive weakness, leg buckling, grip weakness and paresthesias. Pt followed by neurosurgery as an outpatient. Was admitted 6/3-6/5 for AF c RVR, noted polyneuropathy at that time due to cervical spine disease. MRI 07/09/23 IMPRESSION: 1. Advanced cervical spine disc and posterior element degeneration superimposed on multilevel upper cervical spondylolisthesis. Subsequent multifactorial moderate and severe spinal stenosis with Degenerative Cord Compression C3-C4 through C5-C6. Subtle evidence of associated Spinal Cord Myelomalacia at those levels. 2. Mild cervical spinal stenosis elsewhere. Associated up to severe degenerative neural foraminal stenosis at the left C3, bilateral C4, right C5, bilateral C6, and left C7 nerve levels.  PMH: NSTEMI, AF c RVR, HTN, CKD3a, MR, HFpEF. Pt went to OR on 07/15/23 with Dr. Penne Sharps, has orders for hard collar when upright or OOB.   OT comments  Pt seen for OT treatment on this date. Upon arrival to room pt supine in bed with NT completing vitals, agreeable to tx. Pt requires MAXA completing log rolling to come to the EOB, pt tolerated sitting on the EOB during bathing, however pt required MODA to remain sitting up, MAXA for all bathing/dressing tasks. Pt's participation very limited on this date due to levels of pain/discomfort and lethargy possibly from medication. Pt endorses feeling drunk, during session. Pt attempted to assist in cleaning her face but required MAXA to bring cloth to her face. Pt returned to bed for linen change and pericare performed by NT. Pt making limited progress toward goals, will continue to follow POC. Discharge recommendation remains appropriate.        If plan is discharge home,  recommend the following:  A lot of help with walking and/or transfers;A lot of help with bathing/dressing/bathroom;Help with stairs or ramp for entrance;Assistance with cooking/housework   Equipment Recommendations  Other (comment)    Recommendations for Other Services      Precautions / Restrictions Precautions Precautions: Fall Recall of Precautions/Restrictions: Intact Precaution/Restrictions Comments: when upright or OOB Required Braces or Orthoses: Cervical Brace Cervical Brace: Hard collar Restrictions Weight Bearing Restrictions Per Provider Order: No       Mobility Bed Mobility Overal bed mobility: Needs Assistance Bed Mobility: Rolling, Sidelying to Sit, Sit to Sidelying Rolling: Max assist Sidelying to sit: Max assist, HOB elevated     Sit to sidelying: Max assist General bed mobility comments: Pt unable to assist physical in bed mobility, required MAXA to complete    Transfers                   General transfer comment: Unable to static sit while seated on the EOB, too lethargic to attempt further mobility safely.     Balance Overall balance assessment: Needs assistance Sitting-balance support: Feet supported, Bilateral upper extremity supported Sitting balance-Leahy Scale: Poor Sitting balance - Comments: MODA to remain sitting upright while on the EOB Postural control: Posterior lean                                 ADL either performed or assessed with clinical judgement   ADL Overall ADL's : Needs assistance/impaired     Grooming: Wash/dry face;Sitting;Maximal assistance Grooming Details (indicate cue type and reason): Attempted but not able to bring cloth to face Upper  Body Bathing: Maximal assistance;Sitting   Lower Body Bathing: Maximal assistance;Bed level   Upper Body Dressing : Maximal assistance;Sitting Upper Body Dressing Details (indicate cue type and reason): Donning gown         Toileting- Clothing  Manipulation and Hygiene: Maximal assistance;Bed level         General ADL Comments: Limited participation due to level of lethargy    Communication Communication Communication: Impaired Factors Affecting Communication: Difficulty expressing self   Cognition Arousal: Lethargic, Suspect due to medications Behavior During Therapy: Flat affect Cognition: Difficult to assess Difficult to assess due to: Level of arousal           OT - Cognition Comments: Very lethargic, trouble keeping her eyes open                 Following commands: Impaired Following commands impaired: Follows one step commands inconsistently      Cueing   Cueing Techniques: Verbal cues, Tactile cues  Exercises Exercises: Other exercises Other Exercises Other Exercises: Edu: Log rolling technique, review of cervical precations    Shoulder Instructions       General Comments JP drain and incision in tact pre/post session; Pt very lethargic throughout session, stating she had meds recently, remians in 9/10 pain/discomfort    Pertinent Vitals/ Pain       Pain Assessment Pain Assessment: 0-10 Pain Score: 9  Faces Pain Scale: Hurts whole lot Pain Location: neck Pain Descriptors / Indicators: Crushing, Tightness Pain Intervention(s): Limited activity within patient's tolerance, Monitored during session, Premedicated before session  Home Living                                          Prior Functioning/Environment              Frequency  Min 3X/week        Progress Toward Goals  OT Goals(current goals can now be found in the care plan section)  Progress towards OT goals: Progressing toward goals  Acute Rehab OT Goals OT Goal Formulation: With patient/family Time For Goal Achievement: 07/31/23 Potential to Achieve Goals: Good ADL Goals Pt Will Perform Grooming: with modified independence;sitting;standing Pt Will Perform Lower Body Dressing: with modified  independence;sitting/lateral leans;sit to/from stand;with adaptive equipment Pt Will Transfer to Toilet: with modified independence;ambulating;bedside commode Pt Will Perform Toileting - Clothing Manipulation and hygiene: with modified independence;sitting/lateral leans;sit to/from stand Pt/caregiver will Perform Home Exercise Program: Increased ROM;Increased strength;Right Upper extremity;Left upper extremity;With written HEP provided  Plan      Co-evaluation                 AM-PAC OT 6 Clicks Daily Activity     Outcome Measure   Help from another person eating meals?: A Little Help from another person taking care of personal grooming?: A Little Help from another person toileting, which includes using toliet, bedpan, or urinal?: A Lot Help from another person bathing (including washing, rinsing, drying)?: A Lot Help from another person to put on and taking off regular upper body clothing?: A Lot Help from another person to put on and taking off regular lower body clothing?: A Lot 6 Click Score: 14    End of Session    OT Visit Diagnosis: Other abnormalities of gait and mobility (R26.89);Muscle weakness (generalized) (M62.81)   Activity Tolerance Patient limited by lethargy   Patient Left in bed;with  call bell/phone within reach;with bed alarm set;with nursing/sitter in room;with family/visitor present   Nurse Communication Other (comment)        Time: 9090-9071 OT Time Calculation (min): 19 min  Charges: OT General Charges $OT Visit: 1 Visit OT Treatments $Self Care/Home Management : 8-22 mins  Larraine Colas M.S. OTR/L  07/18/23, 10:52 AM

## 2023-07-18 NOTE — Progress Notes (Signed)
 PT Cancellation Note  Patient Details Name: Whitney Stuart MRN: [id number] DOB: February 28, 1950   Cancelled Treatment:     Therapist in this pm, pt states she has just gotten comfortable and was unable to tolerate PT due to potential pain exacerbation. Will re-attempt next available date/time per POC.    [name] 07/18/2023, 3:03 PM

## 2023-07-18 NOTE — Progress Notes (Signed)
 Progress Note   Patient: Whitney Stuart FMW:[id number] DOB: 03/29/1950 DOA: 07/13/2023     5 DOS: the patient was seen and examined on 07/18/2023   Brief hospital course: 73 y.o. female with medical history significant of anxiety, atrial fibrillation, hypertension, hyperlipidemia, chronic kidney disease, asthma.  She has been having problems with her legs and arms going on for 1-1/2 to 2 months but really declined over the last 2 weeks.  Both arms have been numb and tingling.  Her legs are jumping and has right leg pain.  On 6/14 she had MRI of the entire spine.  MRI of the cervical spine showing advanced cervical spine disc and posterior element degeneration on multilevel upper cervical spondylolisthesis with severe spinal stenosis with degenerative cord compression at C3-C4 through C5-C6 with myelomalacia.  MRI of the thoracic spine showed spinal stenosis at T6-T7 with mild ventral cord mass effect.  MRI of the lumbar spine showed advanced chronic lumbar spine degeneration with severe chronic disc and posterior element degeneration at L4-L5.  Sent in from neurosurgery office for likely surgery in the next day or so.   Of note, the patient did have a recent hospitalization where her heart enzyme troponin was pretty elevated.  Cardiac catheterization was negative and echocardiogram showed normal EF.  6/19.  Patient still having symptoms with her arms and legs.  Last dose of Eliquis  was on 618 in the morning.  Patient currently on heparin  drip. 6/20.  Neurosurgical procedure for tomorrow 6/21.  Seen before neurosurgical procedure today.  Still feeling the same with her symptoms in her arms and legs.  Heparin  drip stopped around midnight and started on maintenance fluids. 6/22.  Postoperative day 1 for anterior cervical corpectomy, C4-5; anterior cervical arthrodesis C3-C6.  Posterior cervical instrumentation and fusion C3-C6. 6/23.  Postoperative day 2 for neurosurgical procedure.  Patient still  complaining of pain and some swelling.  Cervical spine x-ray showing some soft tissue swelling.  Patient okay with doing a dose of Decadron.   Assessment and Plan: Cervical myelopathy (HCC) Left lower and upper extremity weakness and incoordination.  Postoperative day 2 for anterior cervical corpectomy on C4-5, anterior cervical arthrodesis C3-C6.  Posterior cervical instrumentation and fusion of C3-C6.  Patient PT and OT consultations.  Referral to acute inpatient rehab.  Pain control with IV and oral medications.  X-ray cervical spine showing soft tissue swelling will give a dose of Decadron and see how she does.  Paroxysmal atrial fibrillation (HCC) Patient on amiodarone  on decrease dose to 200 mg nightly. Eliquis  switched over to heparin  drip on evening of 6/18.  Heparin  drip stopped after midnight on 6/21.  Patient will go on DVT prophylactics Lovenox.  Stroke risk higher being off blood thinner.  Anxiety Continue Klonopin  and Zoloft .  Essential (primary) hypertension Continue lisinopril .  Hyperlipidemia, unspecified Continue Crestor   Asthma, chronic Continue inhalers  Thyroid  nodule TSH and free T4 normal range.  Will need thyroid  ultrasound as outpatient.  Overweight (BMI 25.0-29.9) BMI 29.21        Subjective: Patient having some neck pain as well as shortness of breath.  X-ray showed some soft tissue swelling.  Patient okay with doing a dose of Decadron  Physical Exam: Vitals:   07/18/23 0420 07/18/23 0752 07/18/23 1145 07/18/23 1612  BP: (!) 120/54 (!) 130/54 (!) 130/52 131/64  Pulse: 72 71 76 74  Resp: 17 16 16 16   Temp: 98.4 F (36.9 C) 98 F (36.7 C) 98.4 F (36.9 C) 98 F (36.7  C)  TempSrc: Oral Oral Oral Oral  SpO2: 100% 98% 98% 92%  Weight:      Height:       Physical Exam HENT:     Head: Normocephalic.   Eyes:     General: Lids are normal.     Conjunctiva/sclera: Conjunctivae normal.    Cardiovascular:     Rate and Rhythm: Regular rhythm.  Bradycardia present.     Heart sounds: S1 normal and S2 normal. Murmur heard.     Systolic murmur is present with a grade of 2/6.  Pulmonary:     Breath sounds: Examination of the right-lower field reveals decreased breath sounds. Examination of the left-lower field reveals decreased breath sounds. Decreased breath sounds present. No wheezing, rhonchi or rales.  Abdominal:     Palpations: Abdomen is soft.     Tenderness: There is no abdominal tenderness.   Musculoskeletal:     Right lower leg: No swelling.     Left lower leg: No swelling.   Skin:    General: Skin is warm.     Findings: No rash.   Neurological:     Mental Status: She is alert and oriented to person, place, and time.     Comments: Able to straight leg raise bilaterally.      Data Reviewed: No new data today  Family Communication: Family at bedside  Disposition: Status is: Inpatient Remains inpatient appropriate because: Start IV Decadron for neck swelling 1 dose.  Planned Discharge Destination: Possible acute inpatient rehab    Time spent: 28 minutes Case discussed with neurosurgical team  Author: Charlie Patterson, MD 07/18/2023 5:30 PM  For on call review [email or web address].

## 2023-07-18 NOTE — Progress Notes (Signed)
 Physical Medicine & Rehabilitation Consult Service  Pt discussed with rehab admissions coordinator. Chart has been reviewed. This is a 73 female with hx of anxiety, a fib, ckd who developed severe cervical spondylosis with cord compression from C3-4 thru C5-5 with associated myelomalacia. She underwent C4-5 ACC with ACA C3-6 with posterior instrumentation/fusion C3-6 on 07/16/23 by Dr. Claudene. Hospitalist is following for PAF, HTN. Pain has been an issue post-op. Pt was up with therapy yesterday and was min assist for sit-std transfer and was able to stand about 5 seconds only due to loss of balance. OT worked with pt today and she was max assist for most ADL's. Pt was independent prior to May when symptoms began to progress. She lives with children in one level home with ramp to enter    Home: Home Living Family/patient expects to be discharged to:: Private residence Living Arrangements: Children, Other relatives Available Help at Discharge: Family, Available PRN/intermittently (daugther is an EMT) Type of Home: House Home Access: Ramped entrance Home Layout: One level Bathroom Shower/Tub: Walk-in shower Home Equipment: Agricultural consultant (2 wheels), Wheelchair - manual, Banker History: Prior Function Prior Level of Function : Independent/Modified Independent Mobility Comments: progressive decline since May, prior to that was amb with no AD community distances ADLs Comments: prior to May, MODI-I in ADL/IADL, was requiring assist for ADL/IADL due to ongoing progressive weakness Functional Status:  Mobility: Bed Mobility Overal bed mobility: Needs Assistance Bed Mobility: Rolling, Sidelying to Sit, Sit to Sidelying Rolling: Max assist Sidelying to sit: Max assist, HOB elevated Supine to sit: Supervision Sit to sidelying: Max assist General bed mobility comments: Pt unable to assist physical in bed mobility, required MAXA to complete Transfers Overall transfer level: Needs  assistance Equipment used: Rolling walker (2 wheels) Transfers: Sit to/from Stand Sit to Stand: Min assist General transfer comment: Unable to static sit while seated on the EOB, too lethargic to attempt further mobility safely. Ambulation/Gait Ambulation/Gait assistance:  (unsafe to attempt;) Gait Distance (Feet): 16 Feet (twice) Assistive device: Rolling walker (2 wheels) General Gait Details: steady with RW, no buckling episodes. Pre-gait activities: STS x5; then step pivot transfer x4;    ADL: ADL Overall ADL's : Needs assistance/impaired Eating/Feeding: Minimal assistance, Sitting Grooming: Wash/dry face, Sitting, Maximal assistance Grooming Details (indicate cue type and reason): Attempted but not able to bring cloth to face Upper Body Bathing: Maximal assistance, Sitting Lower Body Bathing: Maximal assistance, Bed level Upper Body Dressing : Maximal assistance, Sitting Upper Body Dressing Details (indicate cue type and reason): Donning gown Lower Body Dressing: Maximal assistance, Sitting/lateral leans Lower Body Dressing Details (indicate cue type and reason): socks sitting on edge of bed Toilet Transfer: Minimal assistance, Moderate assistance, Rolling walker (2 wheels), Cueing for sequencing Toilet Transfer Details (indicate cue type and reason): step pivot transfer to bedside chair Toileting- Clothing Manipulation and Hygiene: Maximal assistance, Bed level Toileting - Clothing Manipulation Details (indicate cue type and reason): anticipate General ADL Comments: Limited participation due to level of lethargy  Cognition: Cognition Orientation Level: Oriented X4 Cognition Arousal: Lethargic, Suspect due to medications Behavior During Therapy: Flat affect   Assessment: 73yo female with C3-4, C5-6 myelopathy with myelomalacia due to severe spinal stenosis progressing over a period of 4-6 weeks +. Pt s/p decompression and fusion 6/21   Plan:  This patient would  benefit from acute inpatient rehab to address mobility, self-care, pain mgt, community reentry. Additionally, the patient requires daily MD oversight of the active medical issues noted  above. Projected goals would be supervision to min assist with an ELOS of 12-16 days.  Dispo and social supports are appropriate.    Rehab Admissions Coordinator to follow up.    Arthea IVAR Gunther, MD, [facility] Physical Medicine & Rehabilitation Medical Director Rehabilitation Services 07/18/2023

## 2023-07-18 NOTE — Progress Notes (Signed)
 Attending Progress Note  History: Whitney Stuart is here for Procedure(s) (LRB): C4-5 Anterior Cervical Corpectomy, arthrodesis and C3-6 Anterior instrumentation (N/A) C3-C6 Posterior Cervical  Fusion (N/A), they are currently 2 Days Post-Op.   POD2: pt experiencing some trouble with swallowing and difficulty breathing this morning POD1:   Physical Exam: Vitals:   07/17/23 2026 07/18/23 0420  BP: (!) 144/59 (!) 120/54  Pulse: 78 72  Resp: 16 17  Temp: 98.3 F (36.8 C) 98.4 F (36.9 C)  SpO2: 100% 100%    AA Ox3. Voice is hoarse CNI   Side Biceps Triceps Deltoid Interossei Grip Wrist Ext. Wrist Flex.  R 5 4+ 5 4+ 4 5 5   L 4- 4- 4 4 4  4- 4-    Side Iliopsoas Quads Hamstring PF DF EHL  R 3 4 5 5 3 3   L 3 4 5 5 3 3    Drain output not recorded overnight  Trachea appears midline. Neck is swelling but not tense.   Data:  Recent Labs  Lab 07/13/23 1150 07/15/23 0515 07/17/23 0619  NA 138 141 134*  K 4.9 4.6 5.1  CL 107 109 104  CO2 23 24 20*  BUN 22 23 21   CREATININE 1.24* 1.22* 1.04*  GLUCOSE 137* 91 124*  CALCIUM  9.1 8.7* 9.0   Recent Labs  Lab 07/13/23 1150  AST 11*  ALT 14  ALKPHOS 54     Recent Labs  Lab 07/15/23 0515 07/16/23 1100 07/17/23 0619  WBC 6.3 6.4 14.0*  HGB 10.0* 9.3* 10.7*  HCT 30.6* 28.0* 31.6*  PLT 312 296 333   Recent Labs  Lab 07/13/23 1647 07/14/23 0600 07/16/23 0340  APTT 33   < > 31  INR 1.3*  --   --    < > = values in this interval not displayed.        DG Cervical Spine 2-3 Views Result Date: 07/16/2023 CLINICAL DATA:  Cervical fusion EXAM: CERVICAL SPINE - 2-3 VIEW COMPARISON:  07/13/2023 FLUOROSCOPY TIME:  Radiation Exposure Index (as provided by the fluoroscopic device): 1.32 mGy If the device does not provide the exposure index: Fluoroscopy Time:  16 seconds Number of Acquired Images:  8 FINDINGS: Initial images demonstrate localization at the C3-4 interspace anteriorly. Corpectomy at C4 and C5 was performed  with spacer placement subsequently, the interbody fusion from C3 to C6 is noted. Subsequent posterior exposure was performed with fusion extending from C3-C6. IMPRESSION: Anterior and posterior fusion from C3-C6. Electronically Signed   By: Oneil Devonshire M.D.   On: 07/16/2023 18:35   DG C-Arm 1-60 Min-No Report Result Date: 07/16/2023 Fluoroscopy was utilized by the requesting physician.  No radiographic interpretation.   DG C-Arm 1-60 Min-No Report Result Date: 07/16/2023 Fluoroscopy was utilized by the requesting physician.  No radiographic interpretation.   DG C-Arm 1-60 Min-No Report Result Date: 07/16/2023 Fluoroscopy was utilized by the requesting physician.  No radiographic interpretation.   DG C-Arm 1-60 Min-No Report Result Date: 07/16/2023 Fluoroscopy was utilized by the requesting physician.  No radiographic interpretation.    Other tests/results:      Latest Ref Rng & Units 07/17/2023    6:19 AM 07/16/2023   11:00 AM 07/15/2023    5:15 AM  CBC  WBC 4.0 - 10.5 K/uL 14.0  6.4  6.3   Hemoglobin 12.0 - 15.0 g/dL 89.2  9.3  89.9   Hematocrit 36.0 - 46.0 % 31.6  28.0  30.6   Platelets 150 - 400  K/uL 333  296  312       Latest Ref Rng & Units 07/17/2023    6:19 AM 07/15/2023    5:15 AM 07/13/2023   11:50 AM  BMP  Glucose 70 - 99 mg/dL 875  91  862   BUN 8 - 23 mg/dL 21  23  22    Creatinine 0.44 - 1.00 mg/dL 8.95  8.77  8.75   Sodium 135 - 145 mmol/L 134  141  138   Potassium 3.5 - 5.1 mmol/L 5.1  4.6  4.9   Chloride 98 - 111 mmol/L 104  109  107   CO2 22 - 32 mmol/L 20  24  23    Calcium  8.9 - 10.3 mg/dL 9.0  8.7  9.1    Cervical xrays FINDINGS: Status post anterior and posterior C3 through C6 cervical fusion. There is corpectomy at C4 and C5 levels. The hardware is intact. No periprosthetic fracture or lucency.   No spondylolisthesis.   Vertebral body heights are maintained.   No fracture or destructive lesion.   At least mild degenerative changes of the upper  cervical spine. Evaluation of the lower cervical spine is markedly limited.   There is thickened prevertebral soft tissue measuring up to 2.4 cm anteroposteriorly, at the level of C5, which may be related to recent surgery. Correlate clinically to determine the need for additional imaging with CT scan. There is a drainage catheter in this region as well.   IMPRESSION: *Postsurgical changes, as described above. There is thickened prevertebral soft tissue measuring up to 2.4 cm anteroposteriorly, at the level of C5, which may be related to recent surgery.     Electronically Signed   By: Ree Molt M.D.   On: 07/18/2023 09:22  Assessment/Plan:  Whitney Stuart is a 73 y.o presenting with rapidly progressive surgery myelopathy s/p C4-5 corpectomy. C3-6 anterior instrumentation. C3-6 PSF.  - Drains:  output not recorded overnight. Will continue for now. Drain output to be recorded at least once per shift. - mobilize - pain control - DVT prophylaxis - STAT xrays ordered; there is prevertebral swelling but trachea is midline - PTOT  Edsel Goods PA-C Department of Neurosurgery

## 2023-07-19 ENCOUNTER — Encounter: Payer: Self-pay | Admitting: Neurosurgery

## 2023-07-19 DIAGNOSIS — F419 Anxiety disorder, unspecified: Secondary | ICD-10-CM | POA: Diagnosis not present

## 2023-07-19 DIAGNOSIS — E875 Hyperkalemia: Secondary | ICD-10-CM | POA: Diagnosis not present

## 2023-07-19 DIAGNOSIS — M4712 Other spondylosis with myelopathy, cervical region: Secondary | ICD-10-CM | POA: Diagnosis not present

## 2023-07-19 DIAGNOSIS — I480 Paroxysmal atrial fibrillation: Secondary | ICD-10-CM | POA: Diagnosis not present

## 2023-07-19 LAB — BASIC METABOLIC PANEL WITH GFR
Anion gap: 7 (ref 5–15)
BUN: 25 mg/dL — ABNORMAL HIGH (ref 8–23)
CO2: 25 mmol/L (ref 22–32)
Calcium: 8.8 mg/dL — ABNORMAL LOW (ref 8.9–10.3)
Chloride: 104 mmol/L (ref 98–111)
Creatinine, Ser: 1.05 mg/dL — ABNORMAL HIGH (ref 0.44–1.00)
GFR, Estimated: 56 mL/min — ABNORMAL LOW (ref >60)
Glucose, Bld: 157 mg/dL — ABNORMAL HIGH (ref 70–99)
Potassium: 5.5 mmol/L — ABNORMAL HIGH (ref 3.5–5.1)
Sodium: 136 mmol/L (ref 135–145)

## 2023-07-19 LAB — CBC
HCT: 28 % — ABNORMAL LOW (ref 36.0–46.0)
Hemoglobin: 9.2 g/dL — ABNORMAL LOW (ref 12.0–15.0)
MCH: 31.8 pg (ref 26.0–34.0)
MCHC: 32.9 g/dL (ref 30.0–36.0)
MCV: 96.9 fL (ref 80.0–100.0)
Platelets: 314 10*3/uL (ref 150–400)
RBC: 2.89 MIL/uL — ABNORMAL LOW (ref 3.87–5.11)
RDW: 12.4 % (ref 11.5–15.5)
WBC: 11.4 10*3/uL — ABNORMAL HIGH (ref 4.0–10.5)
nRBC: 0 % (ref 0.0–0.2)

## 2023-07-19 MED ORDER — SODIUM CHLORIDE 0.9 % IV BOLUS
250.0000 mL | Freq: Once | INTRAVENOUS | Status: AC
  Administered 2023-07-19: 250 mL via INTRAVENOUS

## 2023-07-19 MED ORDER — DEXAMETHASONE SODIUM PHOSPHATE 10 MG/ML IJ SOLN
6.0000 mg | Freq: Once | INTRAMUSCULAR | Status: AC
  Administered 2023-07-19: 6 mg via INTRAVENOUS
  Filled 2023-07-19: qty 1

## 2023-07-19 MED ORDER — DEXAMETHASONE SODIUM PHOSPHATE 4 MG/ML IJ SOLN
4.0000 mg | INTRAMUSCULAR | Status: DC
  Administered 2023-07-20 – 2023-07-22 (×3): 4 mg via INTRAVENOUS
  Filled 2023-07-19 (×4): qty 1

## 2023-07-19 MED ORDER — SODIUM ZIRCONIUM CYCLOSILICATE 10 G PO PACK
10.0000 g | PACK | Freq: Once | ORAL | Status: AC
  Administered 2023-07-19: 10 g via ORAL
  Filled 2023-07-19: qty 1

## 2023-07-19 NOTE — Progress Notes (Addendum)
 Attending Progress Note  History: Whitney Stuart is here for Procedure(s) (LRB): C4-5 Anterior Cervical Corpectomy, arthrodesis and C3-6 Anterior instrumentation (N/A) C3-C6 Posterior Cervical  Fusion (N/A), they are currently 3 Days Post-Op.   POD3: Doing better this morning.  POD2: pt experiencing some trouble with swallowing and difficulty breathing this morning POD1:   Physical Exam: Vitals:   07/18/23 2126 07/19/23 0615  BP:  (!) 111/53  Pulse:  66  Resp:    Temp: 98.7 F (37.1 C) 98.5 F (36.9 C)  SpO2:  94%    AA Ox3. Voice is hoarse CNI   Side Biceps Triceps Deltoid Interossei Grip Wrist Ext. Wrist Flex.  R 5 4+ 5 4+ 4 5 5   L 4- 4- 4 4 4  4- 4-    Side Iliopsoas Quads Hamstring PF DF EHL  R 3 4 5 5 3 3   L 3 4 5 5 3 3    Trachea appears midline. Neck is swelling but not tense.   Data:  Recent Labs  Lab 07/15/23 0515 07/17/23 0619 07/19/23 0337  NA 141 134* 136  K 4.6 5.1 5.5*  CL 109 104 104  CO2 24 20* 25  BUN 23 21 25*  CREATININE 1.22* 1.04* 1.05*  GLUCOSE 91 124* 157*  CALCIUM  8.7* 9.0 8.8*   Recent Labs  Lab 07/13/23 1150  AST 11*  ALT 14  ALKPHOS 54     Recent Labs  Lab 07/16/23 1100 07/17/23 0619 07/19/23 0337  WBC 6.4 14.0* 11.4*  HGB 9.3* 10.7* 9.2*  HCT 28.0* 31.6* 28.0*  PLT 296 333 314   Recent Labs  Lab 07/13/23 1647 07/14/23 0600 07/16/23 0340  APTT 33   < > 31  INR 1.3*  --   --    < > = values in this interval not displayed.        DG Cervical Spine 2 or 3 views Result Date: 07/18/2023 CLINICAL DATA:  86310 Swelling 13689. EXAM: CERVICAL SPINE - 2-3 VIEW COMPARISON:  07/13/2023. FINDINGS: Status post anterior and posterior C3 through C6 cervical fusion. There is corpectomy at C4 and C5 levels. The hardware is intact. No periprosthetic fracture or lucency. No spondylolisthesis. Vertebral body heights are maintained. No fracture or destructive lesion. At least mild degenerative changes of the upper cervical spine.  Evaluation of the lower cervical spine is markedly limited. There is thickened prevertebral soft tissue measuring up to 2.4 cm anteroposteriorly, at the level of C5, which may be related to recent surgery. Correlate clinically to determine the need for additional imaging with CT scan. There is a drainage catheter in this region as well. IMPRESSION: *Postsurgical changes, as described above. There is thickened prevertebral soft tissue measuring up to 2.4 cm anteroposteriorly, at the level of C5, which may be related to recent surgery. Electronically Signed   By: Ree Molt M.D.   On: 07/18/2023 09:22    Other tests/results:      Latest Ref Rng & Units 07/19/2023    3:37 AM 07/17/2023    6:19 AM 07/16/2023   11:00 AM  CBC  WBC 4.0 - 10.5 K/uL 11.4  14.0  6.4   Hemoglobin 12.0 - 15.0 g/dL 9.2  89.2  9.3   Hematocrit 36.0 - 46.0 % 28.0  31.6  28.0   Platelets 150 - 400 K/uL 314  333  296       Latest Ref Rng & Units 07/19/2023    3:37 AM 07/17/2023    6:19  AM 07/15/2023    5:15 AM  BMP  Glucose 70 - 99 mg/dL 842  875  91   BUN 8 - 23 mg/dL 25  21  23    Creatinine 0.44 - 1.00 mg/dL 8.94  8.95  8.77   Sodium 135 - 145 mmol/L 136  134  141   Potassium 3.5 - 5.1 mmol/L 5.5  5.1  4.6   Chloride 98 - 111 mmol/L 104  104  109   CO2 22 - 32 mmol/L 25  20  24    Calcium  8.9 - 10.3 mg/dL 8.8  9.0  8.7    Cervical xrays FINDINGS: Status post anterior and posterior C3 through C6 cervical fusion. There is corpectomy at C4 and C5 levels. The hardware is intact. No periprosthetic fracture or lucency.   No spondylolisthesis.   Vertebral body heights are maintained.   No fracture or destructive lesion.   At least mild degenerative changes of the upper cervical spine. Evaluation of the lower cervical spine is markedly limited.   There is thickened prevertebral soft tissue measuring up to 2.4 cm anteroposteriorly, at the level of C5, which may be related to recent surgery. Correlate clinically  to determine the need for additional imaging with CT scan. There is a drainage catheter in this region as well.   IMPRESSION: *Postsurgical changes, as described above. There is thickened prevertebral soft tissue measuring up to 2.4 cm anteroposteriorly, at the level of C5, which may be related to recent surgery.     Electronically Signed   By: Ree Molt M.D.   On: 07/18/2023 09:22  Assessment/Plan:  Whitney Stuart is a 73 y.o presenting with rapidly progressive surgery myelopathy s/p C4-5 corpectomy. C3-6 anterior instrumentation. C3-6 PSF.  - Drain removed 6/24 - mobilize - pain control - DVT prophylaxis - Pt started on steroid taper for pre-vertebral swelling.    - PTOT  Edsel Goods PA-C Department of Neurosurgery

## 2023-07-19 NOTE — Plan of Care (Signed)
   Problem: Education: Goal: Knowledge of General Education information will improve Description Including pain rating scale, medication(s)/side effects and non-pharmacologic comfort measures Outcome: Progressing

## 2023-07-19 NOTE — TOC Progression Note (Signed)
 Transition of Care [facility]) - Progression Note    Patient Details  Name: Whitney Stuart MRN: [id number] Date of Birth: 02-23-1950  Transition of Care [facility]) CM/SW Contact  Dalia GORMAN Fuse, RN Phone Number: 07/19/2023, 11:00 AM  Clinical Narrative:    [facility] declined to offer a bed. FL2 sent out to remaining SNFs in [place] CIR evaluation pending.    Expected Discharge Plan: Skilled Nursing Facility Barriers to Discharge: Continued Medical Work up  Expected Discharge Plan and Services       Living arrangements for the past 2 months: Single Family Home                                       Social Determinants of Health (SDOH) Interventions SDOH Screenings   Food Insecurity: No Food Insecurity (07/13/2023)  Housing: Low Risk  (07/13/2023)  Transportation Needs: No Transportation Needs (07/13/2023)  Utilities: Not At Risk (07/13/2023)  Alcohol Screen: Low Risk  (12/28/2022)  Depression (PHQ2-9): Medium Risk (07/11/2023)  Financial Resource Strain: Low Risk  (12/28/2022)  Physical Activity: Insufficiently Active (12/28/2022)  Social Connections: Moderately Isolated (07/13/2023)  Stress: No Stress Concern Present (12/28/2022)  Tobacco Use: Medium Risk (07/13/2023)  Health Literacy: Adequate Health Literacy (12/28/2022)    Readmission Risk Interventions     No data to display

## 2023-07-19 NOTE — Discharge Instructions (Signed)

## 2023-07-19 NOTE — TOC Progression Note (Addendum)
 Transition of Care [facility]) - Progression Note    Patient Details  Name: Whitney Stuart MRN: [id number] Date of Birth: [birth year]  Transition of Care [facility]) CM/SW Contact  Dalia GORMAN Fuse, RN Phone Number: 07/19/2023, 4:20 PM  Clinical Narrative:    TOC outreached to the patient's daughter [name]. TOC advised that Patient with bed offers from [facility], [facility], and [facility].    Expected Discharge Plan: Skilled Nursing Facility Barriers to Discharge: Continued Medical Work up  Expected Discharge Plan and Services       Living arrangements for the past 2 months: Single Family Home                                       Social Determinants of Health (SDOH) Interventions SDOH Screenings   Food Insecurity: No Food Insecurity (07/13/2023)  Housing: Low Risk  (07/13/2023)  Transportation Needs: No Transportation Needs (07/13/2023)  Utilities: Not At Risk (07/13/2023)  Alcohol Screen: Low Risk  (12/28/2022)  Depression (PHQ2-9): Medium Risk (07/11/2023)  Financial Resource Strain: Low Risk  (12/28/2022)  Physical Activity: Insufficiently Active (12/28/2022)  Social Connections: Moderately Isolated (07/13/2023)  Stress: No Stress Concern Present (12/28/2022)  Tobacco Use: Medium Risk (07/13/2023)  Health Literacy: Adequate Health Literacy (12/28/2022)    Readmission Risk Interventions     No data to display

## 2023-07-19 NOTE — Progress Notes (Signed)
 Physical Therapy Treatment Patient Details Name: Whitney Stuart MRN: [id number] DOB: 06/11/1950 Today's Date: 07/19/2023   History of Present Illness Whitney Stuart is a 73yoF who comes to [facility] 07/13/23 due to ongoing signs of cervical myelopathy, progressive weakness, leg buckling, grip weakness and paresthesias. Pt followed by neurosurgery as an outpatient. Was admitted 6/3-6/5 for AF c RVR, noted polyneuropathy at that time due to cervical spine disease. MRI 07/09/23 IMPRESSION: 1. Advanced cervical spine disc and posterior element degeneration superimposed on multilevel upper cervical spondylolisthesis. Subsequent multifactorial moderate and severe spinal stenosis with Degenerative Cord Compression C3-C4 through C5-C6. Subtle evidence of associated Spinal Cord Myelomalacia at those levels. 2. Mild cervical spinal stenosis elsewhere. Associated up to severe degenerative neural foraminal stenosis at the left C3, bilateral C4, right C5, bilateral C6, and left C7 nerve levels.  PMH: NSTEMI, AF c RVR, HTN, CKD3a, MR, HFpEF. Pt went to OR on 07/15/23 with Dr. Penne Sharps, has orders for hard collar when upright or OOB.    PT Comments  Pt tolerated treatment well today and was able to improve overall assist levels, balance, ambulation distance, and pain levels from previous session. Today, she is mod-CGA for bed mobility, CGA-min assist for transfers, and CGA-min assist to ambulate a total of 60ft with RW. Increased assist required for transfers and gait due to fatigue and difficulty with sustained quad muscle activation, which increases her risk of falls. Pt able to be IND with pericare after toileting, but does require total assist for donning/doffing of cervical collar. Educated for OOB mobility with meals and toileting for prevention of further deconditioning and progress towards goals. Pt will continue to benefit from skilled acute PT services to address deficits for return to baseline function.    If  plan is discharge home, recommend the following: Assist for transportation;Assistance with cooking/housework;Help with stairs or ramp for entrance;A lot of help with walking and/or transfers;A little help with bathing/dressing/bathroom   Can travel by private vehicle     Yes  Equipment Recommendations  BSC/3in1    Recommendations for Other Services       Precautions / Restrictions Precautions Precautions: Fall Recall of Precautions/Restrictions: Intact Precaution/Restrictions Comments: hard cervical collar, donn in sitting, ok to walk to bathroom without Required Braces or Orthoses: Cervical Brace Cervical Brace: Hard collar     Mobility  Bed Mobility Overal bed mobility: Needs Assistance Bed Mobility: Sidelying to Sit, Sit to Sidelying, Rolling Rolling: Mod assist Sidelying to sit: Contact guard assist     Sit to sidelying: Contact guard assist General bed mobility comments: mod assist to roll in preparation for log roll technique and CGA for trunk facilitation, HOB slightly elevated, use of BUE For support, increased time/effort.    Transfers Overall transfer level: Needs assistance Equipment used: Rolling walker (2 wheels) Transfers: Sit to/from Stand Sit to Stand: Contact guard assist, Min assist           General transfer comment: Initially CGA to stand from EOB and BSC with RW, progressing to min assist for power to stand due to fatigue. Verbal cues for sequencing and hand placement. Demonstrates fair eccentric lowering with proper hand placement.    Ambulation/Gait Ambulation/Gait assistance: Contact guard assist, Min assist Gait Distance (Feet): 10 Feet (76ft x1 (EOB>BSC); 71ft x2 at bedside) Assistive device: Rolling walker (2 wheels)         General Gait Details: Initially CGA for ambulation, progressing to min assist due to fatigue. Demonstrates slowed cadence with decreased step length/foot  clearance bilaterally, difficulty with sustained quad  activation during last bout of gait due to fatigue.     Balance Overall balance assessment: Needs assistance Sitting-balance support: Feet supported, Bilateral upper extremity supported Sitting balance-Leahy Scale: Fair Sitting balance - Comments: BUE for support with seated balance   Standing balance support: During functional activity, Reliant on assistive device for balance Standing balance-Leahy Scale: Fair Standing balance comment: initially fair for standing balance in RW progressing to poor with fatigue                            Communication Communication Communication: Impaired Factors Affecting Communication: Difficulty expressing self  Cognition Arousal: Lethargic, Suspect due to medications Behavior During Therapy: Flat affect                             Following commands: Impaired Following commands impaired: Follows one step commands inconsistently    Cueing Cueing Techniques: Verbal cues, Tactile cues  Exercises Other Exercises Other Exercises: Edu: Log rolling technique, review of cervical precations, OOB to chair for meals, pursed lip breathing, OOB to [facility] for toileting. She verbalized understanding.    General Comments General comments (skin integrity, edema, etc.): assisted with OOB mobility to [facility] for toileting; IND with pericare, assisted with changing gown. Requires total assist for donning cervical brace      Pertinent Vitals/Pain Pain Assessment Pain Assessment: 0-10 Pain Score: 5  Pain Location: neck Pain Descriptors / Indicators: Aching, Sharp Pain Intervention(s): Limited activity within patient's tolerance, Monitored during session, Repositioned, Patient requesting pain meds-RN notified     PT Goals (current goals can now be found in the care plan section) Acute Rehab PT Goals Patient Stated Goal: regain mobility stop falling episodes PT Goal Formulation: With patient Time For Goal Achievement: 07/31/23 Potential to  Achieve Goals: Fair Progress towards PT goals: Progressing toward goals    Frequency    7X/week       AM-PAC PT 6 Clicks Mobility   Outcome Measure  Help needed turning from your back to your side while in a flat bed without using bedrails?: A Lot Help needed moving from lying on your back to sitting on the side of a flat bed without using bedrails?: A Little Help needed moving to and from a bed to a chair (including a wheelchair)?: A Little Help needed standing up from a chair using your arms (e.g., wheelchair or bedside chair)?: A Little Help needed to walk in hospital room?: A Little Help needed climbing 3-5 steps with a railing? : Total 6 Click Score: 15    End of Session Equipment Utilized During Treatment: Gait belt Activity Tolerance: Patient limited by lethargy Patient left: with family/visitor present;in bed;with call bell/phone within reach;with bed alarm set Nurse Communication: Mobility status PT Visit Diagnosis: Difficulty in walking, not elsewhere classified (R26.2);Unsteadiness on feet (R26.81);Other abnormalities of gait and mobility (R26.89);Muscle weakness (generalized) (M62.81);Other symptoms and signs involving the nervous system (R29.898)     Time: 8877-8847 PT Time Calculation (min) (ACUTE ONLY): 30 min  Charges:    $Therapeutic Activity: 23-37 mins PT General Charges $$ ACUTE PT VISIT: 1 Visit                       Camie CHARLENA Kluver, PT, DPT 12:26 PM,07/19/23 Physical Therapist - [facility]

## 2023-07-19 NOTE — Progress Notes (Signed)
 Occupational Therapy Treatment Patient Details Name: Whitney Stuart MRN: [id number] DOB: [birth year] Today's Date: 07/19/2023   History of present illness Whitney Stuart is a 73yoF who comes to [facility] 07/13/23 due to ongoing signs of cervical myelopathy, progressive weakness, leg buckling, grip weakness and paresthesias. Pt followed by neurosurgery as an outpatient. Was admitted 6/3-6/5 for AF c RVR, noted polyneuropathy at that time due to cervical spine disease. MRI 07/09/23 IMPRESSION: 1. Advanced cervical spine disc and posterior element degeneration superimposed on multilevel upper cervical spondylolisthesis. Subsequent multifactorial moderate and severe spinal stenosis with Degenerative Cord Compression C3-C4 through C5-C6. Subtle evidence of associated Spinal Cord Myelomalacia at those levels. 2. Mild cervical spinal stenosis elsewhere. Associated up to severe degenerative neural foraminal stenosis at the left C3, bilateral C4, right C5, bilateral C6, and left C7 nerve levels.  PMH: NSTEMI, AF c RVR, HTN, CKD3a, MR, HFpEF. Pt went to OR on 07/15/23 with Dr. Penne Sharps, has orders for hard collar when upright or OOB.   OT comments  Whitney Stuart seen for OT treatment on this date. Upon arrival to room pt in bed, agreeable to tx. Pt requires MIN A for bed mobility and MIN A + RW for STS at EOB w/ elevated surface. Pt initially CGA + RW to ambulate to bathroom and CGA for toileting; progressed to MIN A due to fatigue and leg buckling. Standing tolerance limited by pain and fatigue, required BUE support throughout mobility. Pt educated on IS use and frequency, d/c recs, HEP, importance of OOB mobility to toilet w/ staff for strengthening. Pt making good progress toward goals, will continue to follow POC. Discharge recommendation remains appropriate.        If plan is discharge home, recommend the following:  A lot of help with walking and/or transfers;A lot of help with bathing/dressing/bathroom;Help with  stairs or ramp for entrance;Assistance with cooking/housework   Equipment Recommendations  Other (comment)    Recommendations for Other Services      Precautions / Restrictions Precautions Precautions: Fall Recall of Precautions/Restrictions: Intact Precaution/Restrictions Comments: hard cervical collar, donn in sitting, ok to walk to bathroom without Required Braces or Orthoses: Cervical Brace Cervical Brace: Hard collar Restrictions Weight Bearing Restrictions Per Provider Order: No       Mobility Bed Mobility Overal bed mobility: Needs Assistance Bed Mobility: Supine to Sit, Sit to Supine     Supine to sit: Min assist, HOB elevated (+ HHA) Sit to supine: Min assist (For LE management)        Transfers Overall transfer level: Needs assistance Equipment used: Rolling walker (2 wheels) Transfers: Sit to/from Stand Sit to Stand: Min assist, From elevated surface           General transfer comment: Initially CGA to ambulate to bathroom, progressing to MIN A due to fatigue and leg buckling     Balance Overall balance assessment: Needs assistance Sitting-balance support: Feet supported, No upper extremity supported Sitting balance-Leahy Scale: Fair Sitting balance - Comments: posterior lean   Standing balance support: During functional activity, Reliant on assistive device for balance, Bilateral upper extremity supported Standing balance-Leahy Scale: Poor                             ADL either performed or assessed with clinical judgement   ADL  General ADL Comments: CGA for toileting    Extremity/Trunk Assessment Upper Extremity Assessment Upper Extremity Assessment: Generalized weakness   Lower Extremity Assessment Lower Extremity Assessment: Generalized weakness        Vision       Perception     Praxis     Communication Communication Communication: No apparent  difficulties Factors Affecting Communication: Difficulty expressing self   Cognition Arousal: Alert Behavior During Therapy: WFL for tasks assessed/performed, Flat affect Cognition: No apparent impairments                               Following commands: Intact        Cueing   Cueing Techniques: Verbal cues, Gestural cues  Exercises      Shoulder Instructions       General Comments assisted with OOB mobility to [facility] for toileting; IND with pericare, assisted with changing gown. Requires total assist for donning cervical brace    Pertinent Vitals/ Pain       Pain Assessment Pain Assessment: 0-10 Pain Score: 6  Pain Location: neck Pain Descriptors / Indicators: Aching, Discomfort, Grimacing Pain Intervention(s): Limited activity within patient's tolerance, Monitored during session, Repositioned  Home Living                                          Prior Functioning/Environment              Frequency  Min 3X/week        Progress Toward Goals  OT Goals(current goals can now be found in the care plan section)     Acute Rehab OT Goals Patient Stated Goal: to return to PLOF OT Goal Formulation: With patient/family Time For Goal Achievement: 08/02/23 Potential to Achieve Goals: Good ADL Goals Pt Will Perform Grooming: with modified independence;sitting;standing Pt Will Perform Lower Body Dressing: with modified independence;sitting/lateral leans;sit to/from stand;with adaptive equipment Pt Will Transfer to Toilet: with modified independence;ambulating;bedside commode Pt Will Perform Toileting - Clothing Manipulation and hygiene: with modified independence;sitting/lateral leans;sit to/from stand Pt/caregiver will Perform Home Exercise Program: Increased ROM;Increased strength;Right Upper extremity;Left upper extremity;With written HEP provided  Plan      Co-evaluation                 AM-PAC OT 6 Clicks Daily Activity      Outcome Measure   Help from another person eating meals?: A Little Help from another person taking care of personal grooming?: A Little Help from another person toileting, which includes using toliet, bedpan, or urinal?: A Lot Help from another person bathing (including washing, rinsing, drying)?: A Lot Help from another person to put on and taking off regular upper body clothing?: A Lot Help from another person to put on and taking off regular lower body clothing?: A Lot 6 Click Score: 14    End of Session Equipment Utilized During Treatment: Gait belt;Rolling walker (2 wheels)  OT Visit Diagnosis: Other abnormalities of gait and mobility (R26.89);Muscle weakness (generalized) (M62.81)   Activity Tolerance Patient limited by fatigue;Patient limited by pain   Patient Left in bed;with call bell/phone within reach;with bed alarm set;with family/visitor present   Nurse Communication          Time: 8740-8677 OT Time Calculation (min): 23 min  Charges: OT General Charges $OT Visit: 1 Visit OT Treatments $Self  Care/Home Management : 23-37 mins  [place], Student OT   [employer] 07/19/2023, 2:18 PM

## 2023-07-19 NOTE — Assessment & Plan Note (Signed)
 Give a dose of Lokelma and a fluid bolus recheck potassium tomorrow.  Hold lisinopril .

## 2023-07-19 NOTE — Progress Notes (Signed)
 Inpatient Rehab Admissions Coordinator:   Called to speak to pt regarding CIR recommendations.  Spoke to pt's daughter, [name], at the bedside while pt working with therapy.  [name] states preference for [facility] and is waiting to hear from [facility] on progress with that.  I let her know I will follow remotely in case they change their minds.   Reche Lowers, PT, DPT Admissions Coordinator [phone] 07/19/23  1:06 PM

## 2023-07-19 NOTE — Progress Notes (Signed)
 Progress Note   Patient: Whitney Stuart FMW:[id number] DOB: 18-Jan-1951 DOA: 07/13/2023     6 DOS: the patient was seen and examined on 07/19/2023   Brief hospital course: 73 y.o. female with medical history significant of anxiety, atrial fibrillation, hypertension, hyperlipidemia, chronic kidney disease, asthma.  She has been having problems with her legs and arms going on for 1-1/2 to 2 months but really declined over the last 2 weeks.  Both arms have been numb and tingling.  Her legs are jumping and has right leg pain.  On 6/14 she had MRI of the entire spine.  MRI of the cervical spine showing advanced cervical spine disc and posterior element degeneration on multilevel upper cervical spondylolisthesis with severe spinal stenosis with degenerative cord compression at C3-C4 through C5-C6 with myelomalacia.  MRI of the thoracic spine showed spinal stenosis at T6-T7 with mild ventral cord mass effect.  MRI of the lumbar spine showed advanced chronic lumbar spine degeneration with severe chronic disc and posterior element degeneration at L4-L5.  Sent in from neurosurgery office for likely surgery in the next day or so.   Of note, the patient did have a recent hospitalization where her heart enzyme troponin was pretty elevated.  Cardiac catheterization was negative and echocardiogram showed normal EF.  6/19.  Patient still having symptoms with her arms and legs.  Last dose of Eliquis  was on 618 in the morning.  Patient currently on heparin  drip. 6/20.  Neurosurgical procedure for tomorrow 6/21.  Seen before neurosurgical procedure today.  Still feeling the same with her symptoms in her arms and legs.  Heparin  drip stopped around midnight and started on maintenance fluids. 6/22.  Postoperative day 1 for anterior cervical corpectomy, C4-5; anterior cervical arthrodesis C3-C6.  Posterior cervical instrumentation and fusion C3-C6. 6/23.  Postoperative day 2 for neurosurgical procedure.  Patient still  complaining of pain and some swelling.  Cervical spine x-ray showing some soft tissue swelling.  Patient okay with doing a dose of Decadron.   Assessment and Plan: Cervical myelopathy (HCC) Left lower and upper extremity weakness and incoordination.  Postoperative day 2 for anterior cervical corpectomy on C4-5, anterior cervical arthrodesis C3-C6.  Posterior cervical instrumentation and fusion of C3-C6.  Patient PT and OT consultations.  Referral to acute inpatient rehab.  Pain control with IV and oral medications.  X-ray cervical spine showing soft tissue swelling.  Patient given a dose of Decadron on 6/23.  She tolerated well so we will reduce today and for tomorrow.  Continue working with PT and OT.  Paroxysmal atrial fibrillation (HCC) Patient on amiodarone  on decrease dose to 200 mg nightly. Eliquis  switched over to heparin  drip on evening of 6/18.  Heparin  drip stopped after midnight on 6/21.  Patient will go on DVT prophylactics Lovenox.  Stroke risk higher being off blood thinner.  Will need neurosurgical clearance before restarting Eliquis .  Hyperkalemia Give a dose of Lokelma and a fluid bolus recheck potassium tomorrow.  Hold lisinopril .  Anxiety Continue Klonopin  and Zoloft .  Essential (primary) hypertension Hold lisinopril  with hyperkalemia  Hyperlipidemia, unspecified Continue Crestor   Asthma, chronic Continue inhalers  Thyroid  nodule TSH and free T4 normal range.  Will need thyroid  ultrasound as outpatient.  Overweight (BMI 25.0-29.9) BMI 29.21        Subjective: Patient tolerated the Decadron given yesterday without issue.  Patient feels a little bit better with regards to her neck tenderness and swelling.  Physical Exam: Vitals:   07/18/23 2033 07/18/23 2126 07/19/23 9384  07/19/23 0910  BP: (!) 143/63  (!) 111/53 (!) 117/54  Pulse: 72  66 70  Resp:    18  Temp: 99.5 F (37.5 C) 98.7 F (37.1 C) 98.5 F (36.9 C) 98.6 F (37 C)  TempSrc:  Oral  Oral   SpO2: 92%  94% 94%  Weight:      Height:       Physical Exam HENT:     Head: Normocephalic.   Eyes:     General: Lids are normal.     Conjunctiva/sclera: Conjunctivae normal.    Cardiovascular:     Rate and Rhythm: Normal rate and regular rhythm.     Heart sounds: S1 normal and S2 normal. Murmur heard.     Systolic murmur is present with a grade of 2/6.  Pulmonary:     Breath sounds: Examination of the right-lower field reveals decreased breath sounds. Examination of the left-lower field reveals decreased breath sounds. Decreased breath sounds present. No wheezing, rhonchi or rales.  Abdominal:     Palpations: Abdomen is soft.     Tenderness: There is no abdominal tenderness.   Musculoskeletal:     Right lower leg: No swelling.     Left lower leg: No swelling.   Skin:    General: Skin is warm.     Findings: No rash.     Comments: Bruising down right arm and posterior left neck   Neurological:     Mental Status: She is alert and oriented to person, place, and time.     Comments: Able to straight leg raise bilaterally.      Data Reviewed: Potassium 5.5, creatinine 1.05, BUN 25, white blood cell count 11.4, hemoglobin 9.2, platelet count 314  Family Communication: Family at bedside  Disposition: Status is: Inpatient Looking into acute inpatient rehab  Planned Discharge Destination: Possibly acute inpatient rehab    Time spent: 28 minutes  Author: Charlie Patterson, MD 07/19/2023 1:33 PM  For on call review [email or web address].

## 2023-07-20 ENCOUNTER — Ambulatory Visit: Admitting: Cardiology

## 2023-07-20 ENCOUNTER — Inpatient Hospital Stay

## 2023-07-20 DIAGNOSIS — M4712 Other spondylosis with myelopathy, cervical region: Secondary | ICD-10-CM | POA: Diagnosis not present

## 2023-07-20 LAB — BASIC METABOLIC PANEL WITH GFR
Anion gap: 8 (ref 5–15)
BUN: 32 mg/dL — ABNORMAL HIGH (ref 8–23)
CO2: 24 mmol/L (ref 22–32)
Calcium: 8.9 mg/dL (ref 8.9–10.3)
Chloride: 105 mmol/L (ref 98–111)
Creatinine, Ser: 1.03 mg/dL — ABNORMAL HIGH (ref 0.44–1.00)
GFR, Estimated: 58 mL/min — ABNORMAL LOW (ref >60)
Glucose, Bld: 101 mg/dL — ABNORMAL HIGH (ref 70–99)
Potassium: 5.1 mmol/L (ref 3.5–5.1)
Sodium: 137 mmol/L (ref 135–145)

## 2023-07-20 NOTE — Progress Notes (Signed)
 Occupational Therapy Treatment Patient Details Name: Whitney Stuart MRN: [id number] DOB: 1950-06-24 Today's Date: 07/20/2023   History of present illness Whitney Stuart is a 73yoF who comes to [facility] 07/13/23 due to ongoing signs of cervical myelopathy, progressive weakness, leg buckling, grip weakness and paresthesias. Pt followed by neurosurgery as an outpatient. Was admitted 6/3-6/5 for AF c RVR, noted polyneuropathy at that time due to cervical spine disease. MRI 07/09/23 IMPRESSION: 1. Advanced cervical spine disc and posterior element degeneration superimposed on multilevel upper cervical spondylolisthesis. Subsequent multifactorial moderate and severe spinal stenosis with Degenerative Cord Compression C3-C4 through C5-C6. Subtle evidence of associated Spinal Cord Myelomalacia at those levels. 2. Mild cervical spinal stenosis elsewhere. Associated up to severe degenerative neural foraminal stenosis at the left C3, bilateral C4, right C5, bilateral C6, and left C7 nerve levels.  PMH: NSTEMI, AF c RVR, HTN, CKD3a, MR, HFpEF. Pt went to OR on 07/15/23 with Dr. Penne Stuart, has orders for hard collar when upright or OOB.   OT comments  Whitney Stuart was seen for OT treatment on this date. Upon arrival to room pt in bed, agreeable to tx. Pt requires MIN A + RW for toilet t/f, SUPERVISION pericare. MIN A hand washing in standing, 1 minor LOB without UE support. Flutter valve and IS reviewed, HEP discussed with family, plan to bring grip and digit strengthener tools. Pt making good progress toward goals, will continue to follow POC. Discharge recommendation updated.       If plan is discharge home, recommend the following:  A lot of help with walking and/or transfers;A lot of help with bathing/dressing/bathroom;Help with stairs or ramp for entrance;Assistance with cooking/housework   Equipment Recommendations  Other (comment)    Recommendations for Other Services      Precautions / Restrictions  Precautions Precautions: Fall Recall of Precautions/Restrictions: Intact Precaution/Restrictions Comments: hard cervical collar, donn in sitting, ok to walk to bathroom without Required Braces or Orthoses: Cervical Brace Cervical Brace: Hard collar Restrictions Weight Bearing Restrictions Per Provider Order: No       Mobility Bed Mobility Overal bed mobility: Needs Assistance Bed Mobility: Supine to Sit, Sit to Supine     Supine to sit: Min assist Sit to supine: Min assist        Transfers Overall transfer level: Needs assistance Equipment used: Rolling walker (2 wheels) Transfers: Sit to/from Stand Sit to Stand: Min assist, From elevated surface                 Balance Overall balance assessment: Needs assistance Sitting-balance support: Feet supported, No upper extremity supported Sitting balance-Whitney Stuart Scale: Good     Standing balance support: No upper extremity supported, During functional activity Standing balance-Whitney Stuart Scale: Poor                             ADL either performed or assessed with clinical judgement   ADL Overall ADL's : Needs assistance/impaired                                       General ADL Comments: MIN A + RW for toilet t/f, SUPERVISION pericare. MIN A hand washing in standing, 1 minor LOB without UE support    Extremity/Trunk Assessment              Vision       Perception  Praxis     Communication Communication Communication: No apparent difficulties   Cognition Arousal: Alert Behavior During Therapy: WFL for tasks assessed/performed, Flat affect Cognition: No apparent impairments                               Following commands: Intact        Cueing      Exercises      Shoulder Instructions       General Comments      Pertinent Vitals/ Pain       Pain Assessment Pain Assessment: 0-10 Pain Score: 2  Pain Location: neck Pain Descriptors / Indicators:  Aching, Discomfort, Grimacing Pain Intervention(s): Limited activity within patient's tolerance, Premedicated before session, Ice applied  Home Living                                          Prior Functioning/Environment              Frequency  Min 3X/week        Progress Toward Goals  OT Goals(current goals can now be found in the care plan section)  Progress towards OT goals: Progressing toward goals  Acute Rehab OT Goals OT Goal Formulation: With patient/family Time For Goal Achievement: 08/02/23 Potential to Achieve Goals: Good ADL Goals Pt Will Perform Grooming: with modified independence;sitting;standing Pt Will Perform Lower Body Dressing: with modified independence;sitting/lateral leans;sit to/from stand;with adaptive equipment Pt Will Transfer to Toilet: with modified independence;ambulating;bedside commode Pt Will Perform Toileting - Clothing Manipulation and hygiene: with modified independence;sitting/lateral leans;sit to/from stand Pt/caregiver will Perform Home Exercise Program: Increased ROM;Increased strength;Right Upper extremity;Left upper extremity;With written HEP provided  Plan      Co-evaluation                 AM-PAC OT 6 Clicks Daily Activity     Outcome Measure   Help from another person eating meals?: A Little Help from another person taking care of personal grooming?: A Little Help from another person toileting, which includes using toliet, bedpan, or urinal?: A Lot Help from another person bathing (including washing, rinsing, drying)?: A Lot Help from another person to put on and taking off regular upper body clothing?: A Lot Help from another person to put on and taking off regular lower body clothing?: A Lot 6 Click Score: 14    End of Session Equipment Utilized During Treatment: Rolling walker (2 wheels)  OT Visit Diagnosis: Other abnormalities of gait and mobility (R26.89);Muscle weakness (generalized)  (M62.81)   Activity Tolerance Patient tolerated treatment well   Patient Left in bed;with call bell/phone within reach;with bed alarm set;with family/visitor present   Nurse Communication          Time: 8474-8458 OT Time Calculation (min): 16 min  Charges: OT General Charges $OT Visit: 1 Visit OT Treatments $Self Care/Home Management : 8-22 mins  Elston Slot, M.S. OTR/L  07/20/23, 4:01 PM  ascom [phone]

## 2023-07-20 NOTE — Progress Notes (Signed)
 Attending Progress Note  History: Whitney Stuart is here for Procedure(s) (LRB): C4-5 Anterior Cervical Corpectomy, arthrodesis and C3-6 Anterior instrumentation (N/A) C3-C6 Posterior Cervical  Fusion (N/A), they are currently 4 Days Post-Op.   POD 4: Patient states that she is doing even better this morning.  Continues to feel improvements.  Is tolerating a diet and is working with therapy POD3: Doing better this morning.  POD2: pt experiencing some trouble with swallowing and difficulty breathing this morning POD1:   Physical Exam: Vitals:   07/20/23 0547 07/20/23 0823  BP: 125/61 (!) 153/72  Pulse: 60 (!) 58  Resp:  19  Temp: 98.3 F (36.8 C) 98.1 F (36.7 C)  SpO2: 95% 94%    AA Ox3. Voice is hoarse CNI   Side Biceps Triceps Deltoid Interossei Grip Wrist Ext. Wrist Flex.  R 5 4+ 5 4+ 4 5 5   L 4- 4- 4 4 4  4- 4-    Side Iliopsoas Quads Hamstring PF DF EHL  R 3 4 5 5 3 3   L 3 4 5 5 3 3    Trachea appears midline. Neck is swelling has improved  Data:  Recent Labs  Lab 07/17/23 0619 07/19/23 0337 07/20/23 0444  NA 134* 136 137  K 5.1 5.5* 5.1  CL 104 104 105  CO2 20* 25 24  BUN 21 25* 32*  CREATININE 1.04* 1.05* 1.03*  GLUCOSE 124* 157* 101*  CALCIUM  9.0 8.8* 8.9   No results for input(s): AST, ALT, ALKPHOS in the last 168 hours.  Invalid input(s): TBILI    Recent Labs  Lab 07/16/23 1100 07/17/23 0619 07/19/23 0337  WBC 6.4 14.0* 11.4*  HGB 9.3* 10.7* 9.2*  HCT 28.0* 31.6* 28.0*  PLT 296 333 314   Recent Labs  Lab 07/13/23 1647 07/14/23 0600 07/16/23 0340  APTT 33   < > 31  INR 1.3*  --   --    < > = values in this interval not displayed.        No results found.   Other tests/results:      Latest Ref Rng & Units 07/19/2023    3:37 AM 07/17/2023    6:19 AM 07/16/2023   11:00 AM  CBC  WBC 4.0 - 10.5 K/uL 11.4  14.0  6.4   Hemoglobin 12.0 - 15.0 g/dL 9.2  89.2  9.3   Hematocrit 36.0 - 46.0 % 28.0  31.6  28.0   Platelets 150  - 400 K/uL 314  333  296       Latest Ref Rng & Units 07/20/2023    4:44 AM 07/19/2023    3:37 AM 07/17/2023    6:19 AM  BMP  Glucose 70 - 99 mg/dL 898  842  875   BUN 8 - 23 mg/dL 32  25  21   Creatinine 0.44 - 1.00 mg/dL 8.96  8.94  8.95   Sodium 135 - 145 mmol/L 137  136  134   Potassium 3.5 - 5.1 mmol/L 5.1  5.5  5.1   Chloride 98 - 111 mmol/L 105  104  104   CO2 22 - 32 mmol/L 24  25  20    Calcium  8.9 - 10.3 mg/dL 8.9  8.8  9.0    Cervical xrays FINDINGS: Status post anterior and posterior C3 through C6 cervical fusion. There is corpectomy at C4 and C5 levels. The hardware is intact. No periprosthetic fracture or lucency.   No spondylolisthesis.   Vertebral body heights  are maintained.   No fracture or destructive lesion.   At least mild degenerative changes of the upper cervical spine. Evaluation of the lower cervical spine is markedly limited.   There is thickened prevertebral soft tissue measuring up to 2.4 cm anteroposteriorly, at the level of C5, which may be related to recent surgery. Correlate clinically to determine the need for additional imaging with CT scan. There is a drainage catheter in this region as well.   IMPRESSION: *Postsurgical changes, as described above. There is thickened prevertebral soft tissue measuring up to 2.4 cm anteroposteriorly, at the level of C5, which may be related to recent surgery.     Electronically Signed   By: Ree Molt M.D.   On: 07/18/2023 09:22  Assessment/Plan:  Whitney Stuart is a 73 y.o presenting with rapidly progressive surgery myelopathy s/p C4-5 corpectomy. C3-6 anterior instrumentation. C3-6 PSF.  - Drain removed 6/24 - mobilize - pain control - DVT prophylaxis - Pt started on steroid taper for pre-vertebral swelling.    - PTOT, okay for DC from neurosurgery perspective once PT OT plan is finalized and enacted  Penne MICAEL Sharps, MD Department of Neurosurgery

## 2023-07-20 NOTE — Progress Notes (Signed)
 Physical Therapy Treatment Patient Details Name: Whitney Stuart MRN: [id number] DOB: 1950-07-30 Today's Date: 07/20/2023   History of Present Illness Concettina Stuart is a 73yoF who comes to [facility] 07/13/23 due to ongoing signs of cervical myelopathy, progressive weakness, leg buckling, grip weakness and paresthesias. Pt followed by neurosurgery as an outpatient. Was admitted 6/3-6/5 for AF c RVR, noted polyneuropathy at that time due to cervical spine disease. MRI 07/09/23 IMPRESSION: 1. Advanced cervical spine disc and posterior element degeneration superimposed on multilevel upper cervical spondylolisthesis. Subsequent multifactorial moderate and severe spinal stenosis with Degenerative Cord Compression C3-C4 through C5-C6. Subtle evidence of associated Spinal Cord Myelomalacia at those levels. 2. Mild cervical spinal stenosis elsewhere. Associated up to severe degenerative neural foraminal stenosis at the left C3, bilateral C4, right C5, bilateral C6, and left C7 nerve levels.  PMH: NSTEMI, AF c RVR, HTN, CKD3a, MR, HFpEF. Pt went to OR on 07/15/23 with Dr. Penne Sharps, has orders for hard collar when upright or OOB.    PT Comments  Pt was long sitting in bed upon arrival. Pt is alert and agreeable however does have some lethargy that dino feels is due to pain medications previous given. Pt's supportive family members (x3) in room throughout session. Pt perform log roll R to short sit with increased time + CGA. Sat EOB and applied brace prior to standing and ambulating ~ 200 ft with RW. Pt did have one episode of LOB with intervention to prevent falling. At conclusion of session, pt was in recliner with chair alarm in place and call bell in reach. DC recs are appropriate to maximize independence and safety with all ADLs prior to returning home with family assist.     If plan is discharge home, recommend the following: Assist for transportation;Assistance with cooking/housework;Help with stairs or ramp  for entrance;A lot of help with walking and/or transfers;A little help with bathing/dressing/bathroom     Equipment Recommendations  Other (comment) (defer to next level of care)       Precautions / Restrictions Precautions Precautions: Fall Recall of Precautions/Restrictions: Intact Precaution/Restrictions Comments: hard cervical collar, donn in sitting, ok to walk to bathroom without Required Braces or Orthoses: Cervical Brace Cervical Brace: Hard collar     Mobility  Bed Mobility Overal bed mobility: Needs Assistance Bed Mobility: Supine to Sit, Rolling, Sidelying to Sit Rolling: Min assist, Used rails Sidelying to sit: Contact guard assist Supine to sit: Min assist, HOB elevated General bed mobility comments: increased time + vcs/TCs for sequencing    Transfers Overall transfer level: Needs assistance Equipment used: Rolling walker (2 wheels) Transfers: Sit to/from Stand Sit to Stand: Min assist, Contact guard assist, From elevated surface  General transfer comment: Pt was able to stand from elevated bed height to RW with min assist. Mostly only required vcs for improved technique    Ambulation/Gait Ambulation/Gait assistance: Contact guard assist, Min assist Gait Distance (Feet): 200 Feet Assistive device: Rolling walker (2 wheels) Gait Pattern/deviations: Step-through pattern, Trunk flexed Gait velocity: decreased  General Gait Details: pt was able to ambulate ~ 200 ft with RW. One episode of staggering/LOB with min assist to prevent fall.     Balance Overall balance assessment: Needs assistance Sitting-balance support: Feet supported, No upper extremity supported Sitting balance-Leahy Scale: Good     Standing balance support: Bilateral upper extremity supported, During functional activity, Reliant on assistive device for balance Standing balance-Leahy Scale: Fair       Communication Communication Communication: No apparent difficulties  Cognition Arousal:  Alert Behavior During Therapy: WFL for tasks assessed/performed, Flat affect   PT - Cognitive impairments: History of cognitive impairments    PT - Cognition Comments: Pt is A and able to consistently follow commands. Was a little lethargic but that dino feels is due to pain medications given earlier this date. She does consistently follow commands throughout session Following commands: Intact      Cueing Cueing Techniques: Verbal cues, Tactile cues     General Comments General comments (skin integrity, edema, etc.): discussed at length with pt/pt's family members x 3 about DC recs and expectations at DC. pt's family still feel STR is best option at DC.      Pertinent Vitals/Pain Pain Assessment Pain Assessment: 0-10 Pain Score: 6  Pain Location: neck Pain Descriptors / Indicators: Aching, Discomfort, Grimacing Pain Intervention(s): Limited activity within patient's tolerance, Monitored during session, Premedicated before session, Repositioned, Ice applied     PT Goals (current goals can now be found in the care plan section) Acute Rehab PT Goals Patient Stated Goal: rehab then home Progress towards PT goals: Progressing toward goals    Frequency    7X/week       AM-PAC PT 6 Clicks Mobility   Outcome Measure  Help needed turning from your back to your side while in a flat bed without using bedrails?: A Little Help needed moving from lying on your back to sitting on the side of a flat bed without using bedrails?: A Little Help needed moving to and from a bed to a chair (including a wheelchair)?: A Little Help needed standing up from a chair using your arms (e.g., wheelchair or bedside chair)?: A Little Help needed to walk in hospital room?: A Little Help needed climbing 3-5 steps with a railing? : A Lot 6 Click Score: 17    End of Session   Activity Tolerance: Patient tolerated treatment well;Patient limited by fatigue Patient left: in chair;with call bell/phone  within reach;with chair alarm set;with family/visitor present Nurse Communication: Mobility status PT Visit Diagnosis: Difficulty in walking, not elsewhere classified (R26.2);Unsteadiness on feet (R26.81);Other abnormalities of gait and mobility (R26.89);Muscle weakness (generalized) (M62.81);Other symptoms and signs involving the nervous system (R29.898)     Time: 9073-9055 PT Time Calculation (min) (ACUTE ONLY): 18 min  Charges:    $Gait Training: 8-22 mins PT General Charges $$ ACUTE PT VISIT: 1 Visit                     Rankin Essex PTA 07/20/23, 12:06 PM

## 2023-07-20 NOTE — TOC Progression Note (Signed)
 Transition of Care [facility]) - Progression Note    Patient Details  Name: Whitney Stuart MRN: [id number] Date of Birth: 07-24-1950  Transition of Care [facility] CM/SW Contact  Luann SHAUNNA [place], [place] Phone Number: 07/20/2023, 12:59 PM  Clinical Narrative:     CSW called pt's daughter, [name], to discuss SNF choice. [name] explained that pt's granddaughter worked at [employer] and stated that she was supposed to talk with Alfonso in admissions at [facility] regarding bed availability.   CSW contacted Alfonso at [facility] and inquired about pt. Informed they normally do not take [facility] though she will discuss with their business office manager regarding offer decision.   Expected Discharge Plan: Skilled Nursing Facility Barriers to Discharge: Insurance Authorization  Expected Discharge Plan and Services       Living arrangements for the past 2 months: Single Family Home                                       Social Determinants of Health (SDOH) Interventions SDOH Screenings   Food Insecurity: No Food Insecurity (07/13/2023)  Housing: Low Risk  (07/13/2023)  Transportation Needs: No Transportation Needs (07/13/2023)  Utilities: Not At Risk (07/13/2023)  Alcohol Screen: Low Risk  (12/28/2022)  Depression (PHQ2-9): Medium Risk (07/11/2023)  Financial Resource Strain: Low Risk  (12/28/2022)  Physical Activity: Insufficiently Active (12/28/2022)  Social Connections: Moderately Isolated (07/13/2023)  Stress: No Stress Concern Present (12/28/2022)  Tobacco Use: Medium Risk (07/13/2023)  Health Literacy: Adequate Health Literacy (12/28/2022)    Readmission Risk Interventions     No data to display

## 2023-07-20 NOTE — Progress Notes (Signed)
 PROGRESS NOTE    Whitney Stuart   FMW:[id number] DOB: 1950-12-26  DOA: 07/13/2023 Date of Service: 07/20/23 which is hospital day 7  PCP: Myrla Jon HERO, MD    Hospital course / significant events:  73 y.o. female with medical history significant of anxiety, atrial fibrillation, hypertension, hyperlipidemia, chronic kidney disease, asthma.  She has been having problems with her legs and arms going on for 1-1/2 to 2 months but really declined over the last 2 weeks.  Both arms have been numb and tingling.  Her legs are jumping and has right leg pain.   06/14 she had MRI of the entire spine.  MRI of the cervical spine showing advanced cervical spine disc and posterior element degeneration on multilevel upper cervical spondylolisthesis with severe spinal stenosis with degenerative cord compression at C3-C4 through C5-C6 with myelomalacia.  MRI of the thoracic spine showed spinal stenosis at T6-T7 with mild ventral cord mass effect.  MRI of the lumbar spine showed advanced chronic lumbar spine degeneration with severe chronic disc and posterior element degeneration at L4-L5.  Sent in from neurosurgery office for likely surgery in the next day or so.    Of note, the patient did have a recent hospitalization where her heart enzyme troponin was pretty elevated.  Cardiac catheterization was negative and echocardiogram showed normal EF.  06/18: outpatient neurosurgery visit - sent to hospital anticipating surgery soon. Pt on heparin  gtt, plan Eliquis  washout then surgery.  06/21: Underwent anterior cervical corpectomy, C4-5; anterior cervical arthrodesis C3-C6.  Posterior cervical instrumentation and fusion C3-C6. Heparin  gtt off.  06/22.  Postoperative day 1 stable 06/23.  Postoperative day 2. C-spine x-ray showing some soft tissue swelling --> Decadron. 06/24: drain removed. Starting lovenox DVT ppx, will need seurosurg clearance to restart Eliquis   06/25: remain stable, await rehab.      Consultants:  Neurosurgery   Procedures/Surgeries: 07/16/23: anterior cervical corpectomy, C4-5; anterior cervical arthrodesis C3-C6.  Posterior cervical instrumentation and fusion C3-C6. Heparin  gtt off.       ASSESSMENT & PLAN:   Cervical myelopathy  S/p anterior cervical corpectomy on C4-5, anterior cervical arthrodesis C3-C6.  Posterior cervical instrumentation and fusion of C3-C6.   PT and OT.  Plan to acute inpatient rehab.   Pain control  initiated Decadron on 6/23 d/t continued pain / swelling on XR, tolerated well so tapering this per neurosurgery    Paroxysmal atrial fibrillation  amiodarone  decrease dose to 200 mg nightly.  Eliquis  switched over to heparin  drip on evening of 6/18.  Heparin  drip stopped after midnight on 6/21.  Patient will go on DVT prophylactics Lovenox.  Stroke risk higher being off blood thinner.  Will need neurosurgical clearance before restarting Eliquis .   Hyperkalemia - resolved hold lisinopril . Monitor BMP   Anxiety Continue Klonopin  and Zoloft .   Essential (primary) hypertension Hold lisinopril  with hyperkalemia   Hyperlipidemia, unspecified Continue Crestor    Asthma, chronic Continue inhalers   Thyroid  nodule TSH and free T4 normal range.   Will need thyroid  ultrasound as outpatient.   Cough CXR no concerns  Encourage IS      Overweight based on BMI: Body mass index is 29.21 kg/m.[name] Significantly low or high BMI is associated with higher medical risk.  Underweight - under 18  overweight - 25 to 29 obese - 30 or more Class 1 obesity: BMI of 30.0 to 34 Class 2 obesity: BMI of 35.0 to 39 Class 3 obesity: BMI of 40.0 to 49 Super Morbid Obesity: BMI 50-59  Super-super Morbid Obesity: BMI 60+ Healthy nutrition and physical activity advised as adjunct to other disease management and risk reduction treatments    DVT prophylaxis: lovenox  IV fluids: no continuous IV fluids  Nutrition: regular diet  Central lines /  other devices: none  Code Status: FULL CODE ACP documentation reviewed: none on file in [facility]  TOC needs: bed offers presented, pending decision from pt/family Medical barriers to dispo: tapering decadron, pain control, neurosurg clearance. Expected medical readiness for discharge tomorrow .              Subjective / Brief ROS:  Patient reports some cough which is new Denies CP/SOB.  Pain controlled.  Denies new weakness.  Tolerating diet.  Reports no concerns w/ urination/defecation.   Family Communication: family at bedside on rounds     Objective Findings:  Vitals:   07/19/23 1854 07/19/23 2001 07/20/23 0547 07/20/23 0823  BP: (!) 124/57 (!) 148/65 125/61 (!) 153/72  Pulse: 67 66 60 (!) 58  Resp: 18   19  Temp: 98.3 F (36.8 C) 98.5 F (36.9 C) 98.3 F (36.8 C) 98.1 F (36.7 C)  TempSrc:      SpO2: 92% 97% 95% 94%  Weight:      Height:        Intake/Output Summary (Last 24 hours) at 07/20/2023 1547 Last data filed at 07/20/2023 1503 Gross per 24 hour  Intake 760 ml  Output --  Net 760 ml   Filed Weights   07/13/23 1147  Weight: 82.1 kg    Examination:  Physical Exam Constitutional:      General: She is not in acute distress.    Appearance: She is not ill-appearing.   Cardiovascular:     Rate and Rhythm: Normal rate and regular rhythm.  Pulmonary:     Effort: Pulmonary effort is normal.     Breath sounds: Normal breath sounds.  Abdominal:     Palpations: Abdomen is soft.   Musculoskeletal:        General: No swelling.     Right lower leg: No edema.     Left lower leg: No edema.   Skin:    General: Skin is warm and dry.   Neurological:     Mental Status: She is alert and oriented to person, place, and time. Mental status is at baseline.   Psychiatric:        Mood and Affect: Mood normal.        Behavior: Behavior normal.          Scheduled Medications:   amiodarone   200 mg Oral QHS   dexamethasone (DECADRON) injection   4 mg Intravenous Q24H   enoxaparin (LOVENOX) injection  40 mg Subcutaneous Q24H   fluticasone  furoate-vilanterol  1 puff Inhalation Daily   polyethylene glycol  17 g Oral Daily   rosuvastatin   15 mg Oral Daily   sertraline   100 mg Oral Daily    Continuous Infusions:   PRN Medications:  acetaminophen  **OR** acetaminophen , albuterol , clonazePAM , cyclobenzaprine , menthol-cetylpyridinium, morphine injection, nitroGLYCERIN , oxyCODONE , oxyCODONE   Antimicrobials from admission:  Anti-infectives (From admission, onward)    Start     Dose/Rate Route Frequency Ordered Stop   07/16/23 2200  ceFAZolin  (ANCEF ) IVPB 2g/100 mL premix  Status:  Discontinued        2 g 200 mL/hr over 30 Minutes Intravenous Every 8 hours 07/16/23 1429 07/16/23 1623   07/15/23 1400  ceFAZolin  (ANCEF ) IVPB 2g/100 mL premix  Status:  Discontinued  2 g 200 mL/hr over 30 Minutes Intravenous Every 8 hours 07/15/23 1304 07/16/23 1429           Data Reviewed:  I have personally reviewed the following...  CBC: Recent Labs  Lab 07/15/23 0515 07/16/23 1100 07/17/23 0619 07/19/23 0337  WBC 6.3 6.4 14.0* 11.4*  HGB 10.0* 9.3* 10.7* 9.2*  HCT 30.6* 28.0* 31.6* 28.0*  MCV 97.1 96.6 95.2 96.9  PLT 312 296 333 314   Basic Metabolic Panel: Recent Labs  Lab 07/15/23 0515 07/17/23 0619 07/19/23 0337 07/20/23 0444  NA 141 134* 136 137  K 4.6 5.1 5.5* 5.1  CL 109 104 104 105  CO2 24 20* 25 24  GLUCOSE 91 124* 157* 101*  BUN 23 21 25* 32*  CREATININE 1.22* 1.04* 1.05* 1.03*  CALCIUM  8.7* 9.0 8.8* 8.9   GFR: Estimated Creatinine Clearance: 53.3 mL/min (A) (by C-G formula based on SCr of 1.03 mg/dL (H)). Liver Function Tests: No results for input(s): AST, ALT, ALKPHOS, BILITOT, PROT, ALBUMIN in the last 168 hours.  No results for input(s): LIPASE, AMYLASE in the last 168 hours. No results for input(s): AMMONIA in the last 168 hours. Coagulation Profile: Recent Labs  Lab  07/13/23 1647  INR 1.3*   Cardiac Enzymes: No results for input(s): CKTOTAL, CKMB, CKMBINDEX, TROPONINI in the last 168 hours. BNP (last 3 results) No results for input(s): PROBNP in the last 8760 hours. HbA1C: No results for input(s): HGBA1C in the last 72 hours. CBG: No results for input(s): GLUCAP in the last 168 hours. Lipid Profile: No results for input(s): CHOL, HDL, LDLCALC, TRIG, CHOLHDL, LDLDIRECT in the last 72 hours. Thyroid  Function Tests: No results for input(s): TSH, T4TOTAL, FREET4, T3FREE, THYROIDAB in the last 72 hours. Anemia Panel: No results for input(s): VITAMINB12, FOLATE, FERRITIN, TIBC, IRON, RETICCTPCT in the last 72 hours. Most Recent Urinalysis On File:     Component Value Date/Time   COLORURINE STRAW (A) 07/13/2023 1150   APPEARANCEUR CLEAR (A) 07/13/2023 1150   APPEARANCEUR Hazy (A) 11/04/2021 1536   LABSPEC 1.009 07/13/2023 1150   PHURINE 5.0 07/13/2023 1150   GLUCOSEU NEGATIVE 07/13/2023 1150   HGBUR NEGATIVE 07/13/2023 1150   BILIRUBINUR NEGATIVE 07/13/2023 1150   BILIRUBINUR Negative 11/04/2021 1536   KETONESUR NEGATIVE 07/13/2023 1150   PROTEINUR NEGATIVE 07/13/2023 1150   UROBILINOGEN 0.2 08/23/2017 1419   NITRITE NEGATIVE 07/13/2023 1150   LEUKOCYTESUR TRACE (A) 07/13/2023 1150   Sepsis Labs: @LABRCNTIP (procalcitonin:4,lacticidven:4) Microbiology: No results found for this or any previous visit (from the past 240 hours).    Radiology Studies last 3 days: DG Chest Port 1 View Result Date: 07/20/2023 CLINICAL DATA:  Cough EXAM: PORTABLE CHEST 1 VIEW COMPARISON:  X-ray 06/29/2023 FINDINGS: Underinflation. No consolidation, pneumothorax or effusion. No edema. Normal cardiopericardial silhouette. Degenerative changes of the spine. Fixation hardware along the cervical spine at the edge of the imaging field. IMPRESSION: No acute cardiopulmonary disease. Electronically Signed   By: Ranell Bring  M.D.   On: 07/20/2023 14:41   DG Cervical Spine 2 or 3 views Result Date: 07/18/2023 CLINICAL DATA:  86310 Swelling 13689. EXAM: CERVICAL SPINE - 2-3 VIEW COMPARISON:  07/13/2023. FINDINGS: Status post anterior and posterior C3 through C6 cervical fusion. There is corpectomy at C4 and C5 levels. The hardware is intact. No periprosthetic fracture or lucency. No spondylolisthesis. Vertebral body heights are maintained. No fracture or destructive lesion. At least mild degenerative changes of the upper cervical spine. Evaluation of the lower cervical spine  is markedly limited. There is thickened prevertebral soft tissue measuring up to 2.4 cm anteroposteriorly, at the level of C5, which may be related to recent surgery. Correlate clinically to determine the need for additional imaging with CT scan. There is a drainage catheter in this region as well. IMPRESSION: *Postsurgical changes, as described above. There is thickened prevertebral soft tissue measuring up to 2.4 cm anteroposteriorly, at the level of C5, which may be related to recent surgery. Electronically Signed   By: Ree Molt M.D.   On: 07/18/2023 09:22          Theodoros Stjames, DO [facility] 07/20/2023, 3:47 PM    Dictation software may have been used to generate the above note. Typos may occur and escape review in typed/dictated notes. Please contact Dr Marsa directly for clarity if needed.  Staff may message me via secure chat in Epic  but this may not receive an immediate response,  please page me for urgent matters!  If 7PM-7AM, please contact night coverage [email or web address]

## 2023-07-20 NOTE — Progress Notes (Deleted)
  Electrophysiology Office Note:    Date:  07/20/2023   ID:  Whitney Stuart, DOB Mar 24, 1950, MRN [id number]  [facility] Cardiologist:  Deatrice Cage, MD  [facility] Electrophysiologist:  OLE ONEIDA HOLTS, MD   Referring MD: Cage Deatrice LABOR, MD   Chief Complaint: Atrial fibrillation  History of Present Illness:    Whitney Stuart is a 73 year old woman who I am seeing today for an evaluation of atrial fibrillation at the request of Dr. Sherel.  The patient has a history of hypertension, hyperlipidemia.  Her atrial fibrillation was diagnosed in April 2024.  She was started on Eliquis  and metoprolol  at that time.  She had an NSTEMI on June 29, 2023 secondary to demand ischemia.  She was started on amiodarone  at that time.  After starting amiodarone  she converted to sinus rhythm.  She is referred to discuss alternatives to indefinite amiodarone  use.     Their past medical, social and family history was reviewed.   ROS:   Please see the history of present illness.    All other systems reviewed and are negative.  EKGs/Labs/Other Studies Reviewed:    The following studies were reviewed today:  June 30, 2023 left heart catheterization Normal coronary arteries  August 12, 2022 ZIO monitor 2 nonsustained SVT, longest 20 beats No atrial fibrillation  Jun 01, 2022 echo EF 60-65 RV normal Severely dilated left atrium Moderate MR  July 13, 2023 EKG sinus rhythm, QTc 474 ms  June 29, 2023 EKG shows atrial fibrillation with rapid ventricular rates, 157 bpm  February 10, 2023 EKG shows sinus rhythm, QTc 416 ms      Physical Exam:    VS:  There were no vitals taken for this visit.    Wt Readings from Last 3 Encounters:  07/13/23 181 lb (82.1 kg)  07/11/23 181 lb (82.1 kg)  06/29/23 183 lb 11.2 oz (83.3 kg)     GEN: no distress CARD: RRR, No MRG RESP: No IWOB. CTAB.        ASSESSMENT AND PLAN:    No diagnosis found.  #Persistent atrial fibrillation #High risk  med use-amiodarone  Maintaining sinus rhythm on amiodarone .  I do think rhythm control is indicated especially with recent hospitalization for atrial fibrillation with rapid ventricular rates leading to demand ischemia.  I discussed treatment options with the patient including continuing amiodarone , transitioning to dofetilide or pursuing catheter ablation.  Discussed treatment options today for AF including antiarrhythmic drug therapy and ablation. Discussed risks, recovery and likelihood of success with each treatment strategy. Risk, benefits, and alternatives to EP study and ablation for afib were discussed. These risks include but are not limited to stroke, bleeding, vascular damage, tamponade, perforation, damage to the esophagus, lungs, phrenic nerve and other structures, pulmonary vein stenosis, worsening renal function, coronary vasospasm and death.  Discussed potential need for repeat ablation procedures and antiarrhythmic drugs after an initial ablation. The patient understands these risk and wishes to proceed.  We will therefore proceed with catheter ablation at the next available time.  Carto, ICE, anesthesia are requested for the procedure.  Will also obtain CT PV protocol prior to the procedure to exclude LAA thrombus and further evaluate atrial anatomy.  June blood work shows stable liver and thyroid  function.      Signed, OLE ONEIDA. HOLTS, MD, [facility], [facility] 07/20/2023 7:37 AM    Electrophysiology [facility]

## 2023-07-20 NOTE — Plan of Care (Signed)

## 2023-07-21 ENCOUNTER — Encounter: Payer: Self-pay | Admitting: Neurosurgery

## 2023-07-21 DIAGNOSIS — M4712 Other spondylosis with myelopathy, cervical region: Secondary | ICD-10-CM | POA: Diagnosis not present

## 2023-07-21 MED ORDER — PHENOL 1.4 % MT LIQD
1.0000 | OROMUCOSAL | Status: DC | PRN
  Administered 2023-07-21 – 2023-07-22 (×2): 1 via OROMUCOSAL
  Filled 2023-07-21: qty 177

## 2023-07-21 NOTE — Progress Notes (Signed)
 PROGRESS NOTE    Whitney Stuart   FMW:[id number] DOB: 07-Jan-1951  DOA: 07/13/2023 Date of Service: 07/21/23 which is hospital day 8  PCP: Whitney Stuart    Hospital course / significant events:  73 y.o. female with medical history significant of anxiety, atrial fibrillation, hypertension, hyperlipidemia, chronic kidney disease, asthma.  She has been having problems with her legs and arms going on for 1-1/2 to 2 months but really declined over the last 2 weeks.  Both arms have been numb and tingling.  Her legs are jumping and has right leg pain.   06/14 she had MRI of the entire spine.  MRI of the cervical spine showing advanced cervical spine disc and posterior element degeneration on multilevel upper cervical spondylolisthesis with severe spinal stenosis with degenerative cord compression at C3-C4 through C5-C6 with myelomalacia.  MRI of the thoracic spine showed spinal stenosis at T6-T7 with mild ventral cord mass effect.  MRI of the lumbar spine showed advanced chronic lumbar spine degeneration with severe chronic disc and posterior element degeneration at L4-L5.  Sent in from neurosurgery office for likely surgery in the next day or so.    Of note, the patient did have a recent hospitalization where her heart enzyme troponin was pretty elevated.  Cardiac catheterization was negative and echocardiogram showed normal EF.  06/18: outpatient neurosurgery visit - sent to hospital anticipating surgery soon. Pt on heparin  gtt, plan Eliquis  washout then surgery.  06/21: Underwent anterior cervical corpectomy, C4-5; anterior cervical arthrodesis C3-C6.  Posterior cervical instrumentation and fusion C3-C6. Heparin  gtt off.  06/22.  Postoperative day 1 stable 06/23.  Postoperative day 2. C-spine x-ray showing some soft tissue swelling --> Decadron. 06/24: drain removed. Starting lovenox DVT ppx, will need seurosurg clearance to restart Eliquis   06/25-06/26: remains stable, await rehab.      Consultants:  Neurosurgery   Procedures/Surgeries: 07/16/23: anterior cervical corpectomy, C4-5; anterior cervical arthrodesis C3-C6.  Posterior cervical instrumentation and fusion C3-C6. Heparin  gtt off.       ASSESSMENT & PLAN:   Cervical myelopathy  S/p anterior cervical corpectomy on C4-5, anterior cervical arthrodesis C3-C6.  Posterior cervical instrumentation and fusion of C3-C6.   PT and OT.  Recs for rehab.   Pain control  initiated Decadron on 6/23 d/t continued pain / swelling on XR, tolerated well so tapering this per neurosurgery    Paroxysmal atrial fibrillation  amiodarone  200 mg nightly.  Eliquis  switched over to heparin  drip on evening of 6/18.  Heparin  drip stopped after midnight on 6/21.  Patient will go on DVT prophylactics Lovenox.  Stroke risk higher being off blood thinner.  Will need neurosurgical clearance before restarting Eliquis .   Hyperkalemia - resolved hold lisinopril . Monitor BMP   Anxiety Continue Klonopin  and Zoloft .   Essential (primary) hypertension Hold lisinopril  with hyperkalemia   Hyperlipidemia, unspecified Continue Crestor    Asthma, chronic Continue inhalers   Thyroid  nodule TSH and free T4 normal range.   Will need thyroid  ultrasound as outpatient.   Cough CXR no concerns  Encourage IS      Overweight based on BMI: Body mass index is 29.21 kg/m.Whitney Stuart Significantly low or high BMI is associated with higher medical risk.  Underweight - under 18  overweight - 25 to 29 obese - 30 or more Class 1 obesity: BMI of 30.0 to 34 Class 2 obesity: BMI of 35.0 to 39 Class 3 obesity: BMI of 40.0 to 49 Super Morbid Obesity: BMI 50-59 Super-super Morbid Obesity: BMI 60+  Healthy nutrition and physical activity advised as adjunct to other disease management and risk reduction treatments    DVT prophylaxis: lovenox  IV fluids: no continuous IV fluids  Nutrition: regular diet  Central lines / other devices: none  Code  Status: FULL CODE ACP documentation reviewed: none on file in [facility]  TOC needs: bed offers presented, pending decision from pt/family Medical barriers to dispo: tapering decadron, pain control, neurosurg clearance. Expected medical readiness for discharge tomorrow .              Subjective / Brief ROS:  Patient reports no concerns today  Denies CP/SOB.  Pain controlled.  Denies new weakness.  Tolerating diet   Family Communication: family at bedside on rounds     Objective Findings:  Vitals:   07/20/23 1725 07/20/23 2035 07/21/23 0429 07/21/23 0758  BP: (!) 131/58 (!) 143/60 (!) 133/52 (!) 147/63  Pulse: 63 62 (!) 57 (!) 57  Resp: 18 18 18 18   Temp: 98.1 F (36.7 C) 98.1 F (36.7 C) 98.1 F (36.7 C) 98.2 F (36.8 C)  TempSrc:    Oral  SpO2: 96% 93% 95% 96%  Weight:      Height:        Intake/Output Summary (Last 24 hours) at 07/21/2023 1354 Last data filed at 07/21/2023 1000 Gross per 24 hour  Intake 480 ml  Output 1500 ml  Net -1020 ml   Filed Weights   07/13/23 1147  Weight: 82.1 kg    Examination:  Physical Exam Constitutional:      General: She is not in acute distress.    Appearance: She is not ill-appearing.   Cardiovascular:     Rate and Rhythm: Normal rate and regular rhythm.  Pulmonary:     Effort: Pulmonary effort is normal.     Breath sounds: Normal breath sounds.  Abdominal:     Palpations: Abdomen is soft.   Musculoskeletal:        General: No swelling.     Right lower leg: No edema.     Left lower leg: No edema.   Skin:    General: Skin is warm and dry.   Neurological:     Mental Status: She is alert and oriented to person, place, and time. Mental status is at baseline.   Psychiatric:        Mood and Affect: Mood normal.        Behavior: Behavior normal.          Scheduled Medications:   amiodarone   200 mg Oral QHS   dexamethasone (DECADRON) injection  4 mg Intravenous Q24H   enoxaparin (LOVENOX) injection   40 mg Subcutaneous Q24H   fluticasone  furoate-vilanterol  1 puff Inhalation Daily   polyethylene glycol  17 g Oral Daily   rosuvastatin   15 mg Oral Daily   sertraline   100 mg Oral Daily    Continuous Infusions:   PRN Medications:  acetaminophen  **OR** acetaminophen , albuterol , clonazePAM , cyclobenzaprine , menthol-cetylpyridinium, morphine injection, nitroGLYCERIN , oxyCODONE , oxyCODONE   Antimicrobials from admission:  Anti-infectives (From admission, onward)    Start     Dose/Rate Route Frequency Ordered Stop   07/16/23 2200  ceFAZolin  (ANCEF ) IVPB 2g/100 mL premix  Status:  Discontinued        2 g 200 mL/hr over 30 Minutes Intravenous Every 8 hours 07/16/23 1429 07/16/23 1623   07/15/23 1400  ceFAZolin  (ANCEF ) IVPB 2g/100 mL premix  Status:  Discontinued        2 g 200  mL/hr over 30 Minutes Intravenous Every 8 hours 07/15/23 1304 07/16/23 1429           Data Reviewed:  I have personally reviewed the following...  CBC: Recent Labs  Lab 07/15/23 0515 07/16/23 1100 07/17/23 0619 07/19/23 0337  WBC 6.3 6.4 14.0* 11.4*  HGB 10.0* 9.3* 10.7* 9.2*  HCT 30.6* 28.0* 31.6* 28.0*  MCV 97.1 96.6 95.2 96.9  PLT 312 296 333 314   Basic Metabolic Panel: Recent Labs  Lab 07/15/23 0515 07/17/23 0619 07/19/23 0337 07/20/23 0444  NA 141 134* 136 137  K 4.6 5.1 5.5* 5.1  CL 109 104 104 105  CO2 24 20* 25 24  GLUCOSE 91 124* 157* 101*  BUN 23 21 25* 32*  CREATININE 1.22* 1.04* 1.05* 1.03*  CALCIUM  8.7* 9.0 8.8* 8.9   GFR: Estimated Creatinine Clearance: 53.3 mL/min (A) (by C-G formula based on SCr of 1.03 mg/dL (H)). Liver Function Tests: No results for input(s): AST, ALT, ALKPHOS, BILITOT, PROT, ALBUMIN in the last 168 hours.  No results for input(s): LIPASE, AMYLASE in the last 168 hours. No results for input(s): AMMONIA in the last 168 hours. Coagulation Profile: No results for input(s): INR, PROTIME in the last 168 hours.  Cardiac  Enzymes: No results for input(s): CKTOTAL, CKMB, CKMBINDEX, TROPONINI in the last 168 hours. BNP (last 3 results) No results for input(s): PROBNP in the last 8760 hours. HbA1C: No results for input(s): HGBA1C in the last 72 hours. CBG: No results for input(s): GLUCAP in the last 168 hours. Lipid Profile: No results for input(s): CHOL, HDL, LDLCALC, TRIG, CHOLHDL, LDLDIRECT in the last 72 hours. Thyroid  Function Tests: No results for input(s): TSH, T4TOTAL, FREET4, T3FREE, THYROIDAB in the last 72 hours. Anemia Panel: No results for input(s): VITAMINB12, FOLATE, FERRITIN, TIBC, IRON, RETICCTPCT in the last 72 hours. Most Recent Urinalysis On File:     Component Value Date/Time   COLORURINE STRAW (A) 07/13/2023 1150   APPEARANCEUR CLEAR (A) 07/13/2023 1150   APPEARANCEUR Hazy (A) 11/04/2021 1536   LABSPEC 1.009 07/13/2023 1150   PHURINE 5.0 07/13/2023 1150   GLUCOSEU NEGATIVE 07/13/2023 1150   HGBUR NEGATIVE 07/13/2023 1150   BILIRUBINUR NEGATIVE 07/13/2023 1150   BILIRUBINUR Negative 11/04/2021 1536   KETONESUR NEGATIVE 07/13/2023 1150   PROTEINUR NEGATIVE 07/13/2023 1150   UROBILINOGEN 0.2 08/23/2017 1419   NITRITE NEGATIVE 07/13/2023 1150   LEUKOCYTESUR TRACE (A) 07/13/2023 1150   Sepsis Labs: @LABRCNTIP (procalcitonin:4,lacticidven:4) Microbiology: No results found for this or any previous visit (from the past 240 hours).    Radiology Studies last 3 days: DG Chest Port 1 View Result Date: 07/20/2023 CLINICAL DATA:  Cough EXAM: PORTABLE CHEST 1 VIEW COMPARISON:  X-ray 06/29/2023 FINDINGS: Underinflation. No consolidation, pneumothorax or effusion. No edema. Normal cardiopericardial silhouette. Degenerative changes of the spine. Fixation hardware along the cervical spine at the edge of the imaging field. IMPRESSION: No acute cardiopulmonary disease. Electronically Signed   By: Ranell Bring M.D.   On: 07/20/2023 14:41   DG  Cervical Spine 2 or 3 views Result Date: 07/18/2023 CLINICAL DATA:  86310 Swelling 13689. EXAM: CERVICAL SPINE - 2-3 VIEW COMPARISON:  07/13/2023. FINDINGS: Status post anterior and posterior C3 through C6 cervical fusion. There is corpectomy at C4 and C5 levels. The hardware is intact. No periprosthetic fracture or lucency. No spondylolisthesis. Vertebral body heights are maintained. No fracture or destructive lesion. At least mild degenerative changes of the upper cervical spine. Evaluation of the lower cervical spine is markedly  limited. There is thickened prevertebral soft tissue measuring up to 2.4 cm anteroposteriorly, at the level of C5, which may be related to recent surgery. Correlate clinically to determine the need for additional imaging with CT scan. There is a drainage catheter in this region as well. IMPRESSION: *Postsurgical changes, as described above. There is thickened prevertebral soft tissue measuring up to 2.4 cm anteroposteriorly, at the level of C5, which may be related to recent surgery. Electronically Signed   By: Ree Molt M.D.   On: 07/18/2023 09:22          Josaiah Muhammed, DO [facility] 07/21/2023, 1:54 PM    Dictation software may have been used to generate the above note. Typos may occur and escape review in typed/dictated notes. Please contact Dr Marsa directly for clarity if needed.  Staff may message me via secure chat in Epic  but this may not receive an immediate response,  please page me for urgent matters!  If 7PM-7AM, please contact night coverage [email or web address]

## 2023-07-21 NOTE — Progress Notes (Signed)
 Attending Progress Note  History: Whitney Stuart is here for Procedure(s) (LRB): C4-5 Anterior Cervical Corpectomy, arthrodesis and C3-6 Anterior instrumentation (N/A) C3-C6 Posterior Cervical  Fusion (N/A), they are currently 72 Days Post-Op.   POD5:  POD4: Patient states that she is doing even better this morning.  Continues to feel improvements.  Is tolerating a diet and is working with therapy POD3: Doing better this morning.  POD2: pt experiencing some trouble with swallowing and difficulty breathing this morning POD1:   Physical Exam: Vitals:   07/20/23 2035 07/21/23 0429  BP: (!) 143/60 (!) 133/52  Pulse: 62 (!) 57  Resp: 18 18  Temp: 98.1 F (36.7 C) 98.1 F (36.7 C)  SpO2: 93% 95%    AA Ox3. Voice is hoarse CNI   Side Biceps Triceps Deltoid Interossei Grip Wrist Ext. Wrist Flex.  R 5 4+ 5 4+ 4 5 5   L 4- 4- 4 4 4  4- 4-   Trachea appears midline. Neck is swelling has improved Incision CDI   Data:  Recent Labs  Lab 07/17/23 0619 07/19/23 0337 07/20/23 0444  NA 134* 136 137  K 5.1 5.5* 5.1  CL 104 104 105  CO2 20* 25 24  BUN 21 25* 32*  CREATININE 1.04* 1.05* 1.03*  GLUCOSE 124* 157* 101*  CALCIUM  9.0 8.8* 8.9   No results for input(s): AST, ALT, ALKPHOS in the last 168 hours.  Invalid input(s): TBILI    Recent Labs  Lab 07/16/23 1100 07/17/23 0619 07/19/23 0337  WBC 6.4 14.0* 11.4*  HGB 9.3* 10.7* 9.2*  HCT 28.0* 31.6* 28.0*  PLT 296 333 314   Recent Labs  Lab 07/16/23 0340  APTT 31        DG Chest Port 1 View Result Date: 07/20/2023 CLINICAL DATA:  Cough EXAM: PORTABLE CHEST 1 VIEW COMPARISON:  X-ray 06/29/2023 FINDINGS: Underinflation. No consolidation, pneumothorax or effusion. No edema. Normal cardiopericardial silhouette. Degenerative changes of the spine. Fixation hardware along the cervical spine at the edge of the imaging field. IMPRESSION: No acute cardiopulmonary disease. Electronically Signed   By: Ranell Bring M.D.    On: 07/20/2023 14:41     Other tests/results:      Latest Ref Rng & Units 07/19/2023    3:37 AM 07/17/2023    6:19 AM 07/16/2023   11:00 AM  CBC  WBC 4.0 - 10.5 K/uL 11.4  14.0  6.4   Hemoglobin 12.0 - 15.0 g/dL 9.2  89.2  9.3   Hematocrit 36.0 - 46.0 % 28.0  31.6  28.0   Platelets 150 - 400 K/uL 314  333  296       Latest Ref Rng & Units 07/20/2023    4:44 AM 07/19/2023    3:37 AM 07/17/2023    6:19 AM  BMP  Glucose 70 - 99 mg/dL 898  842  875   BUN 8 - 23 mg/dL 32  25  21   Creatinine 0.44 - 1.00 mg/dL 8.96  8.94  8.95   Sodium 135 - 145 mmol/L 137  136  134   Potassium 3.5 - 5.1 mmol/L 5.1  5.5  5.1   Chloride 98 - 111 mmol/L 105  104  104   CO2 22 - 32 mmol/L 24  25  20    Calcium  8.9 - 10.3 mg/dL 8.9  8.8  9.0    Cervical xrays FINDINGS: Status post anterior and posterior C3 through C6 cervical fusion. There is corpectomy at C4 and C5  levels. The hardware is intact. No periprosthetic fracture or lucency.   No spondylolisthesis.   Vertebral body heights are maintained.   No fracture or destructive lesion.   At least mild degenerative changes of the upper cervical spine. Evaluation of the lower cervical spine is markedly limited.   There is thickened prevertebral soft tissue measuring up to 2.4 cm anteroposteriorly, at the level of C5, which may be related to recent surgery. Correlate clinically to determine the need for additional imaging with CT scan. There is a drainage catheter in this region as well.   IMPRESSION: *Postsurgical changes, as described above. There is thickened prevertebral soft tissue measuring up to 2.4 cm anteroposteriorly, at the level of C5, which may be related to recent surgery.     Electronically Signed   By: Ree Molt M.D.   On: 07/18/2023 09:22  Assessment/Plan:  [name] is a 73 y.o presenting with rapidly progressive surgery myelopathy s/p C4-5 corpectomy. C3-6 anterior instrumentation. C3-6 PSF.  - Drain  removed 6/24 - mobilize - pain control - DVT prophylaxis - Pt started on steroid taper for pre-vertebral swelling.    - PTOT, okay for DC from neurosurgery perspective once PT OT plan is finalized and enacted  Whitney MICAEL Sharps, MD Department of Neurosurgery

## 2023-07-21 NOTE — Progress Notes (Signed)
 OT Cancellation Note  Patient Details Name: SHELBA SUSI MRN: [id number] DOB: 10-Apr-1950   Cancelled Treatment:    Reason Eval/Treat Not Completed: Fatigue/lethargy limiting ability to participate. Chart reviewed, pt received asleep with multiple family members in room (also resting). OT will check back at later date.   Vignesh Willert L. Delquan Poucher, OTR/L  07/21/23, 4:30 PM

## 2023-07-21 NOTE — Plan of Care (Signed)

## 2023-07-21 NOTE — TOC Progression Note (Signed)
 Transition of Care [facility]) - Progression Note    Patient Details  Name: Whitney Stuart MRN: [id number] Date of Birth: 18-Oct-1950  Transition of Care [facility]) CM/SW Contact  Racheal LITTIE Schimke, RN Phone Number: 07/21/2023, 10:25 AM  Clinical Narrative:  [name] with [name], Daughter about SNF bed offers, she replies that her granddaughter will meet [facility] today about possible bed offer and call back with results.     Expected Discharge Plan: Skilled Nursing Facility Barriers to Discharge: Insurance Authorization  Expected Discharge Plan and Services       Living arrangements for the past 2 months: Single Family Home                                       Social Determinants of Health (SDOH) Interventions SDOH Screenings   Food Insecurity: No Food Insecurity (07/13/2023)  Housing: Low Risk  (07/13/2023)  Transportation Needs: No Transportation Needs (07/13/2023)  Utilities: Not At Risk (07/13/2023)  Alcohol Screen: Low Risk  (12/28/2022)  Depression (PHQ2-9): Medium Risk (07/11/2023)  Financial Resource Strain: Low Risk  (12/28/2022)  Physical Activity: Insufficiently Active (12/28/2022)  Social Connections: Moderately Isolated (07/13/2023)  Stress: No Stress Concern Present (12/28/2022)  Tobacco Use: Medium Risk (07/13/2023)  Health Literacy: Adequate Health Literacy (12/28/2022)    Readmission Risk Interventions     No data to display

## 2023-07-22 DIAGNOSIS — M4712 Other spondylosis with myelopathy, cervical region: Secondary | ICD-10-CM | POA: Diagnosis not present

## 2023-07-22 LAB — BASIC METABOLIC PANEL WITH GFR
Anion gap: 7 (ref 5–15)
BUN: 28 mg/dL — ABNORMAL HIGH (ref 8–23)
CO2: 27 mmol/L (ref 22–32)
Calcium: 9 mg/dL (ref 8.9–10.3)
Chloride: 104 mmol/L (ref 98–111)
Creatinine, Ser: 1.03 mg/dL — ABNORMAL HIGH (ref 0.44–1.00)
GFR, Estimated: 58 mL/min — ABNORMAL LOW (ref >60)
Glucose, Bld: 103 mg/dL — ABNORMAL HIGH (ref 70–99)
Potassium: 5.5 mmol/L — ABNORMAL HIGH (ref 3.5–5.1)
Sodium: 138 mmol/L (ref 135–145)

## 2023-07-22 LAB — CBC
HCT: 28.9 % — ABNORMAL LOW (ref 36.0–46.0)
Hemoglobin: 9.1 g/dL — ABNORMAL LOW (ref 12.0–15.0)
MCH: 30.6 pg (ref 26.0–34.0)
MCHC: 31.5 g/dL (ref 30.0–36.0)
MCV: 97.3 fL (ref 80.0–100.0)
Platelets: 413 10*3/uL — ABNORMAL HIGH (ref 150–400)
RBC: 2.97 MIL/uL — ABNORMAL LOW (ref 3.87–5.11)
RDW: 12.3 % (ref 11.5–15.5)
WBC: 13.2 10*3/uL — ABNORMAL HIGH (ref 4.0–10.5)
nRBC: 0 % (ref 0.0–0.2)

## 2023-07-22 NOTE — Progress Notes (Signed)
 Attending Progress Note  History: Whitney Stuart is here for Procedure(s) (LRB): C4-5 Anterior Cervical Corpectomy, arthrodesis and C3-6 Anterior instrumentation (N/A) C3-C6 Posterior Cervical  Fusion (N/A), they are currently 6 Days Post-Op.   POD5:  Patient is doing well. Continuing to improve POD4: Patient states that she is doing even better this morning.  Continues to feel improvements.  Is tolerating a diet and is working with therapy POD3: Doing better this morning.  POD2: pt experiencing some trouble with swallowing and difficulty breathing this morning POD1:   Physical Exam: Vitals:   07/22/23 0521 07/22/23 0825  BP: (!) 152/56 (!) 148/78  Pulse: (!) 56 (!) 57  Resp: 18 18  Temp: 97.9 F (36.6 C) 98.4 F (36.9 C)  SpO2: 97% 97%    AA Ox3. Voice is hoarse CNI   Side Biceps Triceps Deltoid Interossei Grip Wrist Ext. Wrist Flex.  R 5 4+ 5 4+ 4 5 5   L 4- 4- 4 4 4  4- 4-   Incisions CDI   Data:  Recent Labs  Lab 07/19/23 0337 07/20/23 0444 07/22/23 0432  NA 136 137 138  K 5.5* 5.1 5.5*  CL 104 105 104  CO2 25 24 27   BUN 25* 32* 28*  CREATININE 1.05* 1.03* 1.03*  GLUCOSE 157* 101* 103*  CALCIUM  8.8* 8.9 9.0   No results for input(s): AST, ALT, ALKPHOS in the last 168 hours.  Invalid input(s): TBILI    Recent Labs  Lab 07/17/23 0619 07/19/23 0337 07/22/23 0432  WBC 14.0* 11.4* 13.2*  HGB 10.7* 9.2* 9.1*  HCT 31.6* 28.0* 28.9*  PLT 333 314 413*   Recent Labs  Lab 07/16/23 0340  APTT 31        DG Chest Port 1 View Result Date: 07/20/2023 CLINICAL DATA:  Cough EXAM: PORTABLE CHEST 1 VIEW COMPARISON:  X-ray 06/29/2023 FINDINGS: Underinflation. No consolidation, pneumothorax or effusion. No edema. Normal cardiopericardial silhouette. Degenerative changes of the spine. Fixation hardware along the cervical spine at the edge of the imaging field. IMPRESSION: No acute cardiopulmonary disease. Electronically Signed   By: Ranell Bring M.D.   On:  07/20/2023 14:41     Other tests/results:      Latest Ref Rng & Units 07/22/2023    4:32 AM 07/19/2023    3:37 AM 07/17/2023    6:19 AM  CBC  WBC 4.0 - 10.5 K/uL 13.2  11.4  14.0   Hemoglobin 12.0 - 15.0 g/dL 9.1  9.2  89.2   Hematocrit 36.0 - 46.0 % 28.9  28.0  31.6   Platelets 150 - 400 K/uL 413  314  333       Latest Ref Rng & Units 07/22/2023    4:32 AM 07/20/2023    4:44 AM 07/19/2023    3:37 AM  BMP  Glucose 70 - 99 mg/dL 896  898  842   BUN 8 - 23 mg/dL 28  32  25   Creatinine 0.44 - 1.00 mg/dL 8.96  8.96  8.94   Sodium 135 - 145 mmol/L 138  137  136   Potassium 3.5 - 5.1 mmol/L 5.5  5.1  5.5   Chloride 98 - 111 mmol/L 104  105  104   CO2 22 - 32 mmol/L 27  24  25    Calcium  8.9 - 10.3 mg/dL 9.0  8.9  8.8    Cervical xrays FINDINGS: Status post anterior and posterior C3 through C6 cervical fusion. There is corpectomy at C4 and  C5 levels. The hardware is intact. No periprosthetic fracture or lucency.   No spondylolisthesis.   Vertebral body heights are maintained.   No fracture or destructive lesion.   At least mild degenerative changes of the upper cervical spine. Evaluation of the lower cervical spine is markedly limited.   There is thickened prevertebral soft tissue measuring up to 2.4 cm anteroposteriorly, at the level of C5, which may be related to recent surgery. Correlate clinically to determine the need for additional imaging with CT scan. There is a drainage catheter in this region as well.   IMPRESSION: *Postsurgical changes, as described above. There is thickened prevertebral soft tissue measuring up to 2.4 cm anteroposteriorly, at the level of C5, which may be related to recent surgery.     Electronically Signed   By: Ree Molt M.D.   On: 07/18/2023 09:22  Assessment/Plan:  Whitney Stuart is a 73 y.o presenting with rapidly progressive surgery myelopathy s/p C4-5 corpectomy. C3-6 anterior instrumentation. C3-6 PSF.  - Drain removed  6/24 - mobilize - pain control - DVT prophylaxis - Pt started on steroid taper for pre-vertebral swelling.    - PTOT, okay for DC from neurosurgery perspective once PT OT plan is finalized and enacted  Lyle Decamp, PA-C Department of Neurosurgery

## 2023-07-22 NOTE — Progress Notes (Signed)
 Occupational Therapy Treatment Patient Details Name: Whitney Stuart MRN: [id number] DOB: 10/19/1950 Today's Date: 07/22/2023   History of present illness Whitney Stuart is a 73yoF who comes to [facility] 07/13/23 due to ongoing signs of cervical myelopathy, progressive weakness, leg buckling, grip weakness and paresthesias. Pt followed by neurosurgery as an outpatient. Was admitted 6/3-6/5 for AF c RVR, noted polyneuropathy at that time due to cervical spine disease. MRI 07/09/23 IMPRESSION: 1. Advanced cervical spine disc and posterior element degeneration superimposed on multilevel upper cervical spondylolisthesis. Subsequent multifactorial moderate and severe spinal stenosis with Degenerative Cord Compression C3-C4 through C5-C6. Subtle evidence of associated Spinal Cord Myelomalacia at those levels. 2. Mild cervical spinal stenosis elsewhere. Associated up to severe degenerative neural foraminal stenosis at the left C3, bilateral C4, right C5, bilateral C6, and left C7 nerve levels.  PMH: NSTEMI, AF c RVR, HTN, CKD3a, MR, HFpEF. Pt went to OR on 07/15/23 with Dr. Penne Sharps, has orders for hard collar when upright or OOB.   OT comments  Upon entering the room, pt supine in bed with daughter present in room and MD present. Pt performed supine>sit with min guard and assistance provided to don cervical collar. Pt stands with min A from hospital bed and ambulates into bathroom with RW and min guard. Pt seated on regular commode and is able to void and perform hygiene herself while seated. Pt needing min A to stand from low commode. Pt performs grooming tasks and hand hygiene while standing with RW at sink and then returns to sit on EOB. OT discussed with pt and RN leaving purewick off as pt's mobility has improved. PT arriving and pt transitions without distress. All needs within reach.       If plan is discharge home, recommend the following:  Assistance with cooking/housework;A little help with walking  and/or transfers;A little help with bathing/dressing/bathroom;Help with stairs or ramp for entrance   Equipment Recommendations  BSC/3in1       Precautions / Restrictions Precautions Precautions: Fall Recall of Precautions/Restrictions: Intact Precaution/Restrictions Comments: hard cervical collar, donn in sitting, ok to walk to bathroom without Required Braces or Orthoses: Cervical Brace Cervical Brace: Hard collar Restrictions Weight Bearing Restrictions Per Provider Order: No       Mobility Bed Mobility Overal bed mobility: Needs Assistance Bed Mobility: Supine to Sit     Supine to sit: Contact guard, HOB elevated          Transfers Overall transfer level: Needs assistance Equipment used: Rolling walker (2 wheels) Transfers: Sit to/from Stand Sit to Stand: Min assist                 Balance Overall balance assessment: Needs assistance Sitting-balance support: Feet supported, No upper extremity supported Sitting balance-Leahy Scale: Good     Standing balance support: Bilateral upper extremity supported, During functional activity, Reliant on assistive device for balance Standing balance-Leahy Scale: Fair                             ADL either performed or assessed with clinical judgement   ADL Overall ADL's : Needs assistance/impaired     Grooming: Oral care;Wash/dry hands;Standing;Supervision/safety           Upper Body Dressing : Minimal assistance;Sitting Upper Body Dressing Details (indicate cue type and reason): Donning gown     Toilet Transfer: Minimal assistance;Rolling walker (2 wheels)   Toileting- Clothing Manipulation and Hygiene: Contact guard assist;Sit to/from  stand              Extremity/Trunk Assessment Upper Extremity Assessment Upper Extremity Assessment: Generalized weakness            Vision Patient Visual Report: No change from baseline           Communication Communication Communication: No  apparent difficulties Factors Affecting Communication: Difficulty expressing self   Cognition Arousal: Alert Behavior During Therapy: WFL for tasks assessed/performed, Flat affect Cognition: No apparent impairments                               Following commands: Intact Following commands impaired: Follows one step commands inconsistently      Cueing   Cueing Techniques: Verbal cues, Tactile cues             Pertinent Vitals/ Pain       Pain Assessment Pain Assessment: 0-10 Pain Score: 5  Pain Location: neck Pain Descriptors / Indicators: Discomfort Pain Intervention(s): Limited activity within patient's tolerance, Monitored during session, Premedicated before session, Repositioned, Ice applied         Frequency  Min 3X/week        Progress Toward Goals  OT Goals(current goals can now be found in the care plan section)  Progress towards OT goals: Progressing toward goals      AM-PAC OT 6 Clicks Daily Activity     Outcome Measure   Help from another person eating meals?: None Help from another person taking care of personal grooming?: A Little Help from another person toileting, which includes using toliet, bedpan, or urinal?: A Little Help from another person bathing (including washing, rinsing, drying)?: A Little Help from another person to put on and taking off regular upper body clothing?: A Little Help from another person to put on and taking off regular lower body clothing?: A Little 6 Click Score: 19    End of Session Equipment Utilized During Treatment: Rolling walker (2 wheels)  OT Visit Diagnosis: Other abnormalities of gait and mobility (R26.89);Muscle weakness (generalized) (M62.81)   Activity Tolerance Patient tolerated treatment well   Patient Left in bed;with call bell/phone within reach;with family/visitor present   Nurse Communication Other (comment) (purewick removed)        Time: 8980-8955 OT Time Calculation  (min): 25 min  Charges: OT General Charges $OT Visit: 1 Visit OT Treatments $Self Care/Home Management : 23-37 mins  Izetta Claude, MS, OTR/L , CBIS ascom [phone]  07/22/23, 1:12 PM

## 2023-07-22 NOTE — Progress Notes (Signed)
 Physical Therapy Treatment Patient Details Name: Whitney Stuart MRN: [id number] DOB: 03/26/1950 Today's Date: 07/22/2023   History of Present Illness Whitney Stuart is a 73yoF who comes to [facility] 07/13/23 due to ongoing signs of cervical myelopathy, progressive weakness, leg buckling, grip weakness and paresthesias. Pt followed by neurosurgery as an outpatient. Was admitted 6/3-6/5 for AF c RVR, noted polyneuropathy at that time due to cervical spine disease. MRI 07/09/23 IMPRESSION: 1. Advanced cervical spine disc and posterior element degeneration superimposed on multilevel upper cervical spondylolisthesis. Subsequent multifactorial moderate and severe spinal stenosis with Degenerative Cord Compression C3-C4 through C5-C6. Subtle evidence of associated Spinal Cord Myelomalacia at those levels. 2. Mild cervical spinal stenosis elsewhere. Associated up to severe degenerative neural foraminal stenosis at the left C3, bilateral C4, right C5, bilateral C6, and left C7 nerve levels.  PMH: NSTEMI, AF c RVR, HTN, CKD3a, MR, HFpEF. Pt went to OR on 07/15/23 with Dr. Penne Stuart, has orders for hard collar when upright or OOB.    PT Comments  Pt had just finished working with OT at start of PT session. Pt remains alert and oriented but overall more lethargic in presentation that dino feels is due to pain medications given recently. Pt does agree to session. She was seated EOB at start of session but was easily able to stand and ambulate with RW. Pt more unsteady due to lethargy but did perform stairs with BUE support but has ramp for home entry. Pt continues to tolerate sessions well. Follow MDs recs for post acute PT needs.    If plan is discharge home, recommend the following: Assist for transportation;Assistance with cooking/housework;Help with stairs or ramp for entrance;A lot of help with walking and/or transfers;A little help with bathing/dressing/bathroom     Equipment Recommendations  Other (comment)  (defer to next level of care)       Precautions / Restrictions Precautions Precautions: Fall Recall of Precautions/Restrictions: Intact Precaution/Restrictions Comments: hard cervical collar, donn in sitting, ok to walk to bathroom without Required Braces or Orthoses: Cervical Brace Cervical Brace: Hard collar Restrictions Weight Bearing Restrictions Per Provider Order: No     Mobility  Bed Mobility Overal bed mobility: Needs Assistance Bed Mobility: Sit to Supine Rolling: Min assist, Used rails Sidelying to sit: Contact guard assist, Supervision Supine to sit: Min assist, Contact guard Sit to supine: Min assist General bed mobility comments: increased time + vcs/TCs for sequencing    Transfers Overall transfer level: Needs assistance Equipment used: Rolling walker (2 wheels) Transfers: Sit to/from Stand Sit to Stand: Min assist, Contact guard assist, From elevated surface  General transfer comment: Pt was able to stand from elevated bed height to RW with CGA. Mostly only required vcs for improved technique    Ambulation/Gait Ambulation/Gait assistance: Contact guard assist, Min assist Gait Distance (Feet): 80 Feet Assistive device: Rolling walker (2 wheels) Gait Pattern/deviations: Step-through pattern, Trunk flexed Gait velocity: decreased  General Gait Details: pt was able to ambulate ~ 180 ft with RW.no LOB this date and less overall fatigue noted   Stairs Stairs: Yes Stairs assistance: Min assist Stair Management: Two rails, Step to pattern, Alternating pattern, Forwards Number of Stairs: 4 General stair comments: Pt was able to ascend/descend 4 stair but due to lethargy does require assistance. Pt has ramp entry for home entry/exit    Balance Overall balance assessment: Needs assistance Sitting-balance support: Feet supported, No upper extremity supported Sitting balance-Leahy Scale: Good     Standing balance support: Bilateral upper extremity  supported,  During functional activity, Reliant on assistive device for balance Standing balance-Leahy Scale: Fair       Hotel manager: No apparent difficulties Factors Affecting Communication: Difficulty expressing self  Cognition Arousal: Alert Behavior During Therapy: WFL for tasks assessed/performed, Flat affect   PT - Cognitive impairments: History of cognitive impairments    PT - Cognition Comments: Pt remains A and O but more lethargic in presentation that author  contributes due to pain meds given pruior to OT session right before PT session Following commands: Intact      Cueing Cueing Techniques: Verbal cues, Tactile cues         Pertinent Vitals/Pain Pain Assessment Pain Assessment: 0-10 Pain Score: 7  Pain Location: neck Pain Descriptors / Indicators: Aching, Discomfort, Grimacing Pain Intervention(s): Limited activity within patient's tolerance, Monitored during session, Premedicated before session, Repositioned, Ice applied     PT Goals (current goals can now be found in the care plan section) Acute Rehab PT Goals Patient Stated Goal: rehab then home Progress towards PT goals: Progressing toward goals    Frequency    7X/week       AM-PAC PT 6 Clicks Mobility   Outcome Measure  Help needed turning from your back to your side while in a flat bed without using bedrails?: A Little Help needed moving from lying on your back to sitting on the side of a flat bed without using bedrails?: A Little Help needed moving to and from a bed to a chair (including a wheelchair)?: A Little Help needed standing up from a chair using your arms (e.g., wheelchair or bedside chair)?: A Little Help needed to walk in hospital room?: A Little Help needed climbing 3-5 steps with a railing? : A Lot 6 Click Score: 17    End of Session   Activity Tolerance: Patient tolerated treatment well;Patient limited by fatigue Patient left: in chair;with call  bell/phone within reach;with chair alarm set;with family/visitor present Nurse Communication: Mobility status PT Visit Diagnosis: Difficulty in walking, not elsewhere classified (R26.2);Unsteadiness on feet (R26.81);Other abnormalities of gait and mobility (R26.89);Muscle weakness (generalized) (M62.81);Other symptoms and signs involving the nervous system (R29.898)     Time: 1045-1101 PT Time Calculation (min) (ACUTE ONLY): 16 min  Charges:    $Gait Training: 8-22 mins PT General Charges $$ ACUTE PT VISIT: 1 Visit                     Rankin Essex PTA 07/22/23, 1:11 PM

## 2023-07-22 NOTE — Plan of Care (Signed)

## 2023-07-22 NOTE — Progress Notes (Signed)
 Physical Therapy Treatment Patient Details Name: Whitney Stuart MRN: [id number] DOB: Nov 26, 1950 Today's Date: 07/22/2023   History of Present Illness Whitney Stuart is a 73yoF who comes to [facility] 07/13/23 due to ongoing signs of cervical myelopathy, progressive weakness, leg buckling, grip weakness and paresthesias. Pt followed by neurosurgery as an outpatient. Was admitted 6/3-6/5 for AF c RVR, noted polyneuropathy at that time due to cervical spine disease. MRI 07/09/23 IMPRESSION: 1. Advanced cervical spine disc and posterior element degeneration superimposed on multilevel upper cervical spondylolisthesis. Subsequent multifactorial moderate and severe spinal stenosis with Degenerative Cord Compression C3-C4 through C5-C6. Subtle evidence of associated Spinal Cord Myelomalacia at those levels. 2. Mild cervical spinal stenosis elsewhere. Associated up to severe degenerative neural foraminal stenosis at the left C3, bilateral C4, right C5, bilateral C6, and left C7 nerve levels.  PMH: NSTEMI, AF c RVR, HTN, CKD3a, MR, HFpEF. Pt went to OR on 07/15/23 with Dr. Penne Sharps, has orders for hard collar when upright or OOB.    PT Comments  Pt was long sitting in bed upon arrival. 3 supportive family members at bedside. Pt continues to endorse improved pain and mobility. She demonstrated safe ability to exit bed, stand to RW, and ambulate ~ 180 ft total. Less unsteadiness however still feels weak in her BLEs. Overall pt continues to demonstrate improvements in safe functional abilities with overall less pain. Pt/pt's family still feel she need STR at DC. Family currently remodeling home to make more handicap accessible for post SNF. Acute PT will continue to follow and progress per current POC.    If plan is discharge home, recommend the following: Assist for transportation;Assistance with cooking/housework;Help with stairs or ramp for entrance;A lot of help with walking and/or transfers;A little help with  bathing/dressing/bathroom     Equipment Recommendations  Other (comment) (defer to next level of care)       Precautions / Restrictions Precautions Precautions: Fall Recall of Precautions/Restrictions: Intact Precaution/Restrictions Comments: hard cervical collar, donn in sitting, ok to walk to bathroom without Required Braces or Orthoses: Cervical Brace Cervical Brace: Hard collar Restrictions Weight Bearing Restrictions Per Provider Order: No     Mobility  Bed Mobility Overal bed mobility: Needs Assistance Bed Mobility: Supine to Sit, Rolling, Sidelying to Sit Rolling: Min assist, Used rails Sidelying to sit: Contact guard assist, Supervision Supine to sit: Min assist, Contact guard  General bed mobility comments: increased time + vcs/TCs for sequencing    Transfers Overall transfer level: Needs assistance Equipment used: Rolling walker (2 wheels) Transfers: Sit to/from Stand Sit to Stand: Min assist, Contact guard assist, From elevated surface  General transfer comment: Pt was able to stand from elevated bed height to RW with CGA-min assist. Mostly only required vcs for improved technique    Ambulation/Gait Ambulation/Gait assistance: Contact guard assist, Min assist Gait Distance (Feet): 180 Feet Assistive device: Rolling walker (2 wheels) Gait Pattern/deviations: Step-through pattern, Trunk flexed Gait velocity: decreased  General Gait Details: pt was able to ambulate ~ 180 ft with RW.no LOB this date and less overall fatigue noted    Balance Overall balance assessment: Needs assistance Sitting-balance support: Feet supported, No upper extremity supported Sitting balance-Leahy Scale: Good     Standing balance support: Bilateral upper extremity supported, During functional activity, Reliant on assistive device for balance Standing balance-Leahy Scale: Fair       Musician Communication: No apparent difficulties  Cognition Arousal:  Alert Behavior During Therapy: WFL for tasks assessed/performed, Flat affect  PT - Cognitive impairments: History of cognitive impairments   PT - Cognition Comments: Pt is A and able to consistently follow commands. Was a little lethargic but that dino feels is due to pain medications given earlier this date. She does consistently follow commands throughout session Following commands: Intact      Cueing Cueing Techniques: Verbal cues, Tactile cues         Pertinent Vitals/Pain Pain Assessment Pain Assessment: 0-10 Pain Score: 4  Pain Location: neck Pain Descriptors / Indicators: Aching, Discomfort, Grimacing Pain Intervention(s): Monitored during session, Limited activity within patient's tolerance, Premedicated before session     PT Goals (current goals can now be found in the care plan section) Acute Rehab PT Goals Patient Stated Goal: rehab then home Progress towards PT goals: Progressing toward goals    Frequency    7X/week       AM-PAC PT 6 Clicks Mobility   Outcome Measure  Help needed turning from your back to your side while in a flat bed without using bedrails?: A Little Help needed moving from lying on your back to sitting on the side of a flat bed without using bedrails?: A Little Help needed moving to and from a bed to a chair (including a wheelchair)?: A Little Help needed standing up from a chair using your arms (e.g., wheelchair or bedside chair)?: A Little Help needed to walk in hospital room?: A Little Help needed climbing 3-5 steps with a railing? : A Lot 6 Click Score: 17    End of Session   Activity Tolerance: Patient tolerated treatment well;Patient limited by fatigue Patient left: in chair;with call bell/phone within reach;with chair alarm set;with family/visitor present Nurse Communication: Mobility status PT Visit Diagnosis: Difficulty in walking, not elsewhere classified (R26.2);Unsteadiness on feet (R26.81);Other abnormalities of gait  and mobility (R26.89);Muscle weakness (generalized) (M62.81);Other symptoms and signs involving the nervous system (R29.898)     Time: 9093-9077 PT Time Calculation (min) (ACUTE ONLY): 16 min  Charges:    $Gait Training: 8-22 mins PT General Charges $$ ACUTE PT VISIT: 1 Visit                     Rankin Essex PTA 07/22/23, 6:59 AM

## 2023-07-22 NOTE — Plan of Care (Signed)

## 2023-07-22 NOTE — TOC Progression Note (Signed)
 Transition of Care [facility]) - Progression Note    Patient Details  Name: Whitney Stuart MRN: [id number] Date of Birth: 1950-08-05  Transition of Care [facility]) CM/SW Contact  Marinda Cooks, RN Phone Number: 07/22/2023, 11:01 AM  Clinical Narrative:    This CM spoke with pt's daughter [name] @[phone] and updated 1st choice for bed referral at [facility] was declined due to pt's insurance not being accepted , 2cd choice [facility] currently did not have beds available. Pt's daughter [name] agreed to accept bed at [facility]. This CM spoke with Admission liaison  Tammy and confirmed pt can be rec'd today, in auth started. TOC will cont to follow dc planning/ care coordination and update as applicable.    Expected Discharge Plan: Skilled Nursing Facility Barriers to Discharge: Insurance Authorization  Expected Discharge Plan and Services       Living arrangements for the past 2 months: Single Family Home                                       Social Determinants of Health (SDOH) Interventions SDOH Screenings   Food Insecurity: No Food Insecurity (07/13/2023)  Housing: Low Risk  (07/13/2023)  Transportation Needs: No Transportation Needs (07/13/2023)  Utilities: Not At Risk (07/13/2023)  Alcohol Screen: Low Risk  (12/28/2022)  Depression (PHQ2-9): Medium Risk (07/11/2023)  Financial Resource Strain: Low Risk  (12/28/2022)  Physical Activity: Insufficiently Active (12/28/2022)  Social Connections: Moderately Isolated (07/13/2023)  Stress: No Stress Concern Present (12/28/2022)  Tobacco Use: Medium Risk (07/13/2023)  Health Literacy: Adequate Health Literacy (12/28/2022)    Readmission Risk Interventions     No data to display

## 2023-07-22 NOTE — Progress Notes (Signed)
 PROGRESS NOTE    Whitney Stuart   FMW:[id number] DOB: 06-12-1950  DOA: 07/13/2023 Date of Service: 07/22/23 which is hospital day 9  PCP: Myrla Jon HERO, MD    Hospital course / significant events:  73 y.o. female with medical history significant of anxiety, atrial fibrillation, hypertension, hyperlipidemia, chronic kidney disease, asthma.  She has been having problems with her legs and arms going on for 1-1/2 to 2 months but really declined over the last 2 weeks.  Both arms have been numb and tingling.  Her legs are jumping and has right leg pain.   06/14 she had MRI of the entire spine.  MRI of the cervical spine showing advanced cervical spine disc and posterior element degeneration on multilevel upper cervical spondylolisthesis with severe spinal stenosis with degenerative cord compression at C3-C4 through C5-C6 with myelomalacia.  MRI of the thoracic spine showed spinal stenosis at T6-T7 with mild ventral cord mass effect.  MRI of the lumbar spine showed advanced chronic lumbar spine degeneration with severe chronic disc and posterior element degeneration at L4-L5.  Sent in from neurosurgery office for likely surgery in the next day or so.    Of note, the patient did have a recent hospitalization where her heart enzyme troponin was pretty elevated.  Cardiac catheterization was negative and echocardiogram showed normal EF.  06/18: outpatient neurosurgery visit - sent to hospital anticipating surgery soon. Pt on heparin  gtt, plan Eliquis  washout then surgery.  06/21: Underwent anterior cervical corpectomy, C4-5; anterior cervical arthrodesis C3-C6.  Posterior cervical instrumentation and fusion C3-C6. Heparin  gtt off.  06/22.  Postoperative day 1 stable 06/23.  Postoperative day 2. C-spine x-ray showing some soft tissue swelling --> Decadron . 06/24: drain removed. Starting lovenox  DVT ppx, will need seurosurg clearance to restart Eliquis   06/25-06/27: remains stable, await rehab.      Consultants:  Neurosurgery   Procedures/Surgeries: 07/16/23: anterior cervical corpectomy, C4-5; anterior cervical arthrodesis C3-C6.  Posterior cervical instrumentation and fusion C3-C6. Heparin  gtt off.       ASSESSMENT & PLAN:   Cervical myelopathy  S/p anterior cervical corpectomy on C4-5, anterior cervical arthrodesis C3-C6.  Posterior cervical instrumentation and fusion of C3-C6.   PT and OT.  Recs for rehab.   Pain control  initiated Decadron  on 6/23 d/t continued pain / swelling on XR, tolerated well so tapering this per neurosurgery    Paroxysmal atrial fibrillation  amiodarone  200 mg nightly.  Eliquis  switched over to heparin  drip on evening of 6/18.  Heparin  drip stopped after midnight on 6/21.  Patient will go on DVT prophylactics Lovenox .  Stroke risk higher being off blood thinner.  Will need neurosurgical clearance before restarting Eliquis .   Hyperkalemia - resolved hold lisinopril . Monitor BMP   Anxiety Continue Klonopin  and Zoloft .   Essential (primary) hypertension Hold lisinopril  with hyperkalemia   Hyperlipidemia, unspecified Continue Crestor    Asthma, chronic Continue inhalers   Thyroid  nodule TSH and free T4 normal range.   Will need thyroid  ultrasound as outpatient.   Cough CXR no concerns  Encourage IS      Overweight based on BMI: Body mass index is 29.21 kg/m.[name] Significantly low or high BMI is associated with higher medical risk.  Underweight - under 18  overweight - 25 to 29 obese - 30 or more Class 1 obesity: BMI of 30.0 to 34 Class 2 obesity: BMI of 35.0 to 39 Class 3 obesity: BMI of 40.0 to 49 Super Morbid Obesity: BMI 50-59 Super-super Morbid Obesity: BMI 60+  Healthy nutrition and physical activity advised as adjunct to other disease management and risk reduction treatments    DVT prophylaxis: lovenox   IV fluids: no continuous IV fluids  Nutrition: regular diet  Central lines / other devices: none  Code  Status: FULL CODE ACP documentation reviewed: none on file in [facility]  TOC needs: bed offers presented, pending decision from pt/family Medical barriers to dispo: tapering decadron , pain control, neurosurg clearance. Expected medical readiness for discharge tomorrow .              Subjective / Brief ROS:  Patient reports no concerns today  Denies CP/SOB.  Pain controlled.  Denies new weakness.  Tolerating diet   Family Communication: daughter at bedside on rounds and I spoke w/ other daughter over the phone on rounds     Objective Findings:  Vitals:   07/21/23 1550 07/21/23 2005 07/22/23 0521 07/22/23 0825  BP: (!) 138/55 (!) 144/52 (!) 152/56 (!) 148/78  Pulse: (!) 59 63 (!) 56 (!) 57  Resp: 16 18 18 18   Temp: 98.1 F (36.7 C) 98.7 F (37.1 C) 97.9 F (36.6 C) 98.4 F (36.9 C)  TempSrc: Oral   Oral  SpO2: 96% 95% 97% 97%  Weight:      Height:        Intake/Output Summary (Last 24 hours) at 07/22/2023 1507 Last data filed at 07/22/2023 1055 Gross per 24 hour  Intake 240 ml  Output 2900 ml  Net -2660 ml   Filed Weights   07/13/23 1147  Weight: 82.1 kg    Examination:  Physical Exam Constitutional:      General: She is not in acute distress.    Appearance: She is not ill-appearing.   Cardiovascular:     Rate and Rhythm: Normal rate and regular rhythm.  Pulmonary:     Effort: Pulmonary effort is normal.     Breath sounds: Normal breath sounds.  Abdominal:     Palpations: Abdomen is soft.   Musculoskeletal:        General: No swelling.     Right lower leg: No edema.     Left lower leg: No edema.   Skin:    General: Skin is warm and dry.   Neurological:     Mental Status: She is alert and oriented to person, place, and time. Mental status is at baseline.   Psychiatric:        Mood and Affect: Mood normal.        Behavior: Behavior normal.          Scheduled Medications:   amiodarone   200 mg Oral QHS   dexamethasone  (DECADRON )  injection  4 mg Intravenous Q24H   enoxaparin  (LOVENOX ) injection  40 mg Subcutaneous Q24H   fluticasone  furoate-vilanterol  1 puff Inhalation Daily   polyethylene glycol  17 g Oral Daily   rosuvastatin   15 mg Oral Daily   sertraline   100 mg Oral Daily    Continuous Infusions:   PRN Medications:  acetaminophen  **OR** acetaminophen , albuterol , clonazePAM , cyclobenzaprine , morphine  injection, nitroGLYCERIN , oxyCODONE , oxyCODONE , phenol  Antimicrobials from admission:  Anti-infectives (From admission, onward)    Start     Dose/Rate Route Frequency Ordered Stop   07/16/23 2200  ceFAZolin  (ANCEF ) IVPB 2g/100 mL premix  Status:  Discontinued        2 g 200 mL/hr over 30 Minutes Intravenous Every 8 hours 07/16/23 1429 07/16/23 1623   07/15/23 1400  ceFAZolin  (ANCEF ) IVPB 2g/100 mL premix  Status:  Discontinued        2 g 200 mL/hr over 30 Minutes Intravenous Every 8 hours 07/15/23 1304 07/16/23 1429           Data Reviewed:  I have personally reviewed the following...  CBC: Recent Labs  Lab 07/16/23 1100 07/17/23 0619 07/19/23 0337 07/22/23 0432  WBC 6.4 14.0* 11.4* 13.2*  HGB 9.3* 10.7* 9.2* 9.1*  HCT 28.0* 31.6* 28.0* 28.9*  MCV 96.6 95.2 96.9 97.3  PLT 296 333 314 413*   Basic Metabolic Panel: Recent Labs  Lab 07/17/23 0619 07/19/23 0337 07/20/23 0444 07/22/23 0432  NA 134* 136 137 138  K 5.1 5.5* 5.1 5.5*  CL 104 104 105 104  CO2 20* 25 24 27   GLUCOSE 124* 157* 101* 103*  BUN 21 25* 32* 28*  CREATININE 1.04* 1.05* 1.03* 1.03*  CALCIUM  9.0 8.8* 8.9 9.0   GFR: Estimated Creatinine Clearance: 53.3 mL/min (A) (by C-G formula based on SCr of 1.03 mg/dL (H)). Liver Function Tests: No results for input(s): AST, ALT, ALKPHOS, BILITOT, PROT, ALBUMIN in the last 168 hours.  No results for input(s): LIPASE, AMYLASE in the last 168 hours. No results for input(s): AMMONIA in the last 168 hours. Coagulation Profile: No results for input(s):  INR, PROTIME in the last 168 hours.  Cardiac Enzymes: No results for input(s): CKTOTAL, CKMB, CKMBINDEX, TROPONINI in the last 168 hours. BNP (last 3 results) No results for input(s): PROBNP in the last 8760 hours. HbA1C: No results for input(s): HGBA1C in the last 72 hours. CBG: No results for input(s): GLUCAP in the last 168 hours. Lipid Profile: No results for input(s): CHOL, HDL, LDLCALC, TRIG, CHOLHDL, LDLDIRECT in the last 72 hours. Thyroid  Function Tests: No results for input(s): TSH, T4TOTAL, FREET4, T3FREE, THYROIDAB in the last 72 hours. Anemia Panel: No results for input(s): VITAMINB12, FOLATE, FERRITIN, TIBC, IRON, RETICCTPCT in the last 72 hours. Most Recent Urinalysis On File:     Component Value Date/Time   COLORURINE STRAW (A) 07/13/2023 1150   APPEARANCEUR CLEAR (A) 07/13/2023 1150   APPEARANCEUR Hazy (A) 11/04/2021 1536   LABSPEC 1.009 07/13/2023 1150   PHURINE 5.0 07/13/2023 1150   GLUCOSEU NEGATIVE 07/13/2023 1150   HGBUR NEGATIVE 07/13/2023 1150   BILIRUBINUR NEGATIVE 07/13/2023 1150   BILIRUBINUR Negative 11/04/2021 1536   KETONESUR NEGATIVE 07/13/2023 1150   PROTEINUR NEGATIVE 07/13/2023 1150   UROBILINOGEN 0.2 08/23/2017 1419   NITRITE NEGATIVE 07/13/2023 1150   LEUKOCYTESUR TRACE (A) 07/13/2023 1150   Sepsis Labs: @LABRCNTIP (procalcitonin:4,lacticidven:4) Microbiology: No results found for this or any previous visit (from the past 240 hours).    Radiology Studies last 3 days: DG Chest Port 1 View Result Date: 07/20/2023 CLINICAL DATA:  Cough EXAM: PORTABLE CHEST 1 VIEW COMPARISON:  X-ray 06/29/2023 FINDINGS: Underinflation. No consolidation, pneumothorax or effusion. No edema. Normal cardiopericardial silhouette. Degenerative changes of the spine. Fixation hardware along the cervical spine at the edge of the imaging field. IMPRESSION: No acute cardiopulmonary disease. Electronically Signed   By:  Ranell Bring M.D.   On: 07/20/2023 14:41          Bob Eastwood, DO [facility] 07/22/2023, 3:07 PM    Dictation software may have been used to generate the above note. Typos may occur and escape review in typed/dictated notes. Please contact Dr Marsa directly for clarity if needed.  Staff may message me via secure chat in Epic  but this may not receive an immediate response,  please page me for urgent  matters!  If 7PM-7AM, please contact night coverage [email or web address]

## 2023-07-23 DIAGNOSIS — J45909 Unspecified asthma, uncomplicated: Secondary | ICD-10-CM | POA: Diagnosis not present

## 2023-07-23 DIAGNOSIS — M6259 Muscle wasting and atrophy, not elsewhere classified, multiple sites: Secondary | ICD-10-CM | POA: Diagnosis not present

## 2023-07-23 DIAGNOSIS — G7249 Other inflammatory and immune myopathies, not elsewhere classified: Secondary | ICD-10-CM | POA: Diagnosis not present

## 2023-07-23 DIAGNOSIS — Z741 Need for assistance with personal care: Secondary | ICD-10-CM | POA: Diagnosis not present

## 2023-07-23 DIAGNOSIS — E041 Nontoxic single thyroid nodule: Secondary | ICD-10-CM | POA: Diagnosis not present

## 2023-07-23 DIAGNOSIS — M4802 Spinal stenosis, cervical region: Secondary | ICD-10-CM | POA: Diagnosis not present

## 2023-07-23 DIAGNOSIS — M51369 Other intervertebral disc degeneration, lumbar region without mention of lumbar back pain or lower extremity pain: Secondary | ICD-10-CM | POA: Diagnosis not present

## 2023-07-23 DIAGNOSIS — M4712 Other spondylosis with myelopathy, cervical region: Secondary | ICD-10-CM | POA: Diagnosis not present

## 2023-07-23 DIAGNOSIS — G959 Disease of spinal cord, unspecified: Secondary | ICD-10-CM | POA: Diagnosis not present

## 2023-07-23 DIAGNOSIS — M62838 Other muscle spasm: Secondary | ICD-10-CM | POA: Diagnosis not present

## 2023-07-23 DIAGNOSIS — R2681 Unsteadiness on feet: Secondary | ICD-10-CM | POA: Diagnosis not present

## 2023-07-23 DIAGNOSIS — E785 Hyperlipidemia, unspecified: Secondary | ICD-10-CM | POA: Diagnosis not present

## 2023-07-23 DIAGNOSIS — I4891 Unspecified atrial fibrillation: Secondary | ICD-10-CM | POA: Diagnosis not present

## 2023-07-23 LAB — BASIC METABOLIC PANEL WITH GFR
Anion gap: 8 (ref 5–15)
BUN: 33 mg/dL — ABNORMAL HIGH (ref 8–23)
CO2: 24 mmol/L (ref 22–32)
Calcium: 9 mg/dL (ref 8.9–10.3)
Chloride: 103 mmol/L (ref 98–111)
Creatinine, Ser: 1.17 mg/dL — ABNORMAL HIGH (ref 0.44–1.00)
GFR, Estimated: 50 mL/min — ABNORMAL LOW (ref >60)
Glucose, Bld: 89 mg/dL (ref 70–99)
Potassium: 4.9 mmol/L (ref 3.5–5.1)
Sodium: 135 mmol/L (ref 135–145)

## 2023-07-23 LAB — CBC
HCT: 29.8 % — ABNORMAL LOW (ref 36.0–46.0)
Hemoglobin: 9.6 g/dL — ABNORMAL LOW (ref 12.0–15.0)
MCH: 31.3 pg (ref 26.0–34.0)
MCHC: 32.2 g/dL (ref 30.0–36.0)
MCV: 97.1 fL (ref 80.0–100.0)
Platelets: 435 10*3/uL — ABNORMAL HIGH (ref 150–400)
RBC: 3.07 MIL/uL — ABNORMAL LOW (ref 3.87–5.11)
RDW: 12.3 % (ref 11.5–15.5)
WBC: 14.8 10*3/uL — ABNORMAL HIGH (ref 4.0–10.5)
nRBC: 0 % (ref 0.0–0.2)

## 2023-07-23 MED ORDER — CLONAZEPAM 0.5 MG PO TABS: 0.5000 mg | ORAL_TABLET | Freq: Two times a day (BID) | ORAL | 0 refills | Status: DC | PRN

## 2023-07-23 MED ORDER — CYCLOBENZAPRINE HCL 5 MG PO TABS: 5.0000 mg | ORAL_TABLET | Freq: Three times a day (TID) | ORAL | Status: DC | PRN

## 2023-07-23 MED ORDER — ROSUVASTATIN CALCIUM 5 MG PO TABS: 15.0000 mg | ORAL_TABLET | Freq: Every day | ORAL | Status: DC

## 2023-07-23 MED ORDER — DEXAMETHASONE 4 MG PO TABS
4.0000 mg | ORAL_TABLET | Freq: Every day | ORAL | Status: DC
  Administered 2023-07-23: 4 mg via ORAL
  Filled 2023-07-23: qty 1

## 2023-07-23 MED ORDER — OXYCODONE HCL 10 MG PO TABS: 5.0000 mg | ORAL_TABLET | Freq: Four times a day (QID) | ORAL | 0 refills | Status: DC | PRN

## 2023-07-23 MED ORDER — ACETAMINOPHEN 325 MG PO TABS: 650.0000 mg | ORAL_TABLET | Freq: Four times a day (QID) | ORAL | Status: AC | PRN

## 2023-07-23 MED ORDER — PHENOL 1.4 % MT LIQD: 1.0000 | OROMUCOSAL | Status: DC | PRN

## 2023-07-23 MED ORDER — POLYETHYLENE GLYCOL 3350 17 G PO PACK: 17.0000 g | PACK | Freq: Every day | ORAL | Status: DC | PRN

## 2023-07-23 MED ORDER — AMIODARONE HCL 200 MG PO TABS: 200.0000 mg | ORAL_TABLET | Freq: Every day | ORAL | Status: DC

## 2023-07-23 MED ORDER — DEXAMETHASONE 1 MG PO TABS: ORAL_TABLET | ORAL | Status: DC

## 2023-07-23 NOTE — Discharge Summary (Signed)
 Physician Discharge Summary   Patient: Whitney Stuart MRN: [id number]  DOB: 08/30/1950   Admit:     Date of Admission: 07/13/2023 Admitted from: home   Discharge: Date of discharge: 07/23/23 Disposition: Skilled nursing facility Condition at discharge: good  CODE STATUS: FULL CODE     Discharge Physician: Laneta Blunt, DO [facility]     PCP: Myrla Jon HERO, MD  Recommendations for Outpatient Follow-up:  Follow up with PCP Myrla Jon HERO, MD in 2-4 weeks Follow up scheduled w/ Neurosurgery 07/27/2023    Discharge Instructions     Diet - low sodium heart healthy   Complete by: As directed    Discharge wound care:   Complete by: As directed    Keep wound clean dry covered  Following up with neurosurgery 07/02   Increase activity slowly   Complete by: As directed          Discharge Diagnoses: Principal Problem:   Myelopathy due to cervical spondylosis at multiple levels Active Problems:   Cervical myelopathy (HCC)   Paroxysmal atrial fibrillation (HCC)   Hyperkalemia   Essential (primary) hypertension   Anxiety   Hyperlipidemia, unspecified   Asthma, chronic   Overweight (BMI 25.0-29.9)   Thyroid  nodule   Elevated troponin   Cervical stenosis of spinal canal   Decreased ambulation status   Upper extremity weakness   Lower extremity weakness        Hospital course / significant events:  73 y.o. female with medical history significant of anxiety, atrial fibrillation, hypertension, hyperlipidemia, chronic kidney disease, asthma.  She has been having problems with her legs and arms going on for 1-1/2 to 2 months but really declined over the last 2 weeks.  Both arms have been numb and tingling.  Her legs are jumping and has right leg pain.   06/14 she had MRI of the entire spine.  MRI of the cervical spine showing advanced cervical spine disc and posterior element degeneration on multilevel upper cervical spondylolisthesis  with severe spinal stenosis with degenerative cord compression at C3-C4 through C5-C6 with myelomalacia.  MRI of the thoracic spine showed spinal stenosis at T6-T7 with mild ventral cord mass effect.  MRI of the lumbar spine showed advanced chronic lumbar spine degeneration with severe chronic disc and posterior element degeneration at L4-L5.  Sent in from neurosurgery office for likely surgery in the next day or so.    Of note, the patient did have a recent hospitalization where her heart enzyme troponin was pretty elevated.  Cardiac catheterization was negative and echocardiogram showed normal EF.  06/18: outpatient neurosurgery visit - sent to hospital anticipating surgery soon. Pt on heparin  gtt, plan Eliquis  washout then surgery.  06/21: Underwent anterior cervical corpectomy, C4-5; anterior cervical arthrodesis C3-C6.  Posterior cervical instrumentation and fusion C3-C6. Heparin  gtt off.  06/22.  Postoperative day 1 stable 06/23.  Postoperative day 2. C-spine x-ray showing some soft tissue swelling --> Decadron . 06/24: drain removed. Starting lovenox  DVT ppx, will need seurosurg clearance to restart Eliquis   06/25-06/27: remains stable, await rehab.  06/28: confirmed for SNF rehab    Consultants:  Neurosurgery   Procedures/Surgeries: 07/16/23: anterior cervical corpectomy, C4-5; anterior cervical arthrodesis C3-C6.  Posterior cervical instrumentation and fusion C3-C6.      ASSESSMENT & PLAN:   Cervical myelopathy  S/p anterior cervical corpectomy on C4-5, anterior cervical arthrodesis C3-C6.  Posterior cervical instrumentation and fusion of C3-C6.   PT and OT.  Recs for rehab.  Pain control  initiated Decadron  on 6/23 d/t continued pain / swelling on XR, tolerated well so tapering this per neurosurgery --> po taper on discharge    Paroxysmal atrial fibrillation  amiodarone  200 mg nightly.  Eliquis  switched over to heparin  drip on evening of 6/18.  Heparin  drip stopped after  midnight on 6/21.  Patient will go on DVT prophylactics Lovenox .  Stroke risk higher being off blood thinner.  Will need neurosurgical clearance before restarting Eliquis  --> address at follow up    Hyperkalemia - resolved hold lisinopril . Monitor BMP   Anxiety Continue Klonopin  and Zoloft .   Essential (primary) hypertension Hold lisinopril  with hyperkalemia   Hyperlipidemia, unspecified Continue Crestor    Asthma, chronic Continue inhalers   Thyroid  nodule TSH and free T4 normal range.   Will need thyroid  ultrasound as outpatient.   Cough CXR no concerns  Encourage IS      Overweight based on BMI: Body mass index is 29.21 kg/m.[name] Significantly low or high BMI is associated with higher medical risk.  Underweight - under 18  overweight - 25 to 29 obese - 30 or more Class 1 obesity: BMI of 30.0 to 34 Class 2 obesity: BMI of 35.0 to 39 Class 3 obesity: BMI of 40.0 to 49 Super Morbid Obesity: BMI 50-59 Super-super Morbid Obesity: BMI 60+ Healthy nutrition and physical activity advised as adjunct to other disease management and risk reduction treatments          Discharge Instructions  Allergies as of 07/23/2023       Reactions   Methylprednisolone  Palpitations        Medication List     PAUSE taking these medications    apixaban  5 MG Tabs tablet Wait to take this until your doctor or other care provider tells you to start again. Commonly known as: ELIQUIS  Take 1 tablet (5 mg total) by mouth 2 (two) times daily.       STOP taking these medications    Farxiga 10 MG Tabs tablet Generic drug: dapagliflozin propanediol   gabapentin  100 MG capsule Commonly known as: NEURONTIN    lisinopril  10 MG tablet Commonly known as: ZESTRIL    metoprolol  tartrate 25 MG tablet Commonly known as: LOPRESSOR    Rollator Ultra-Light Misc       TAKE these medications    acetaminophen  325 MG tablet Commonly known as: TYLENOL  Take 2 tablets (650 mg total) by  mouth every 6 (six) hours as needed for mild pain (pain score 1-3), fever or headache (or Fever >/= 101).   albuterol  108 (90 Base) MCG/ACT inhaler Commonly known as: VENTOLIN  HFA Inhale 2 puffs into the lungs every 6 (six) hours as needed for wheezing or shortness of breath.   amiodarone  200 MG tablet Commonly known as: PACERONE  Take 1 tablet (200 mg total) by mouth at bedtime. What changed: See the new instructions.   clonazePAM  0.5 MG tablet Commonly known as: KLONOPIN  Take 1 tablet (0.5 mg total) by mouth 2 (two) times daily as needed (anxiety). What changed:  how much to take reasons to take this   cyclobenzaprine  5 MG tablet Commonly known as: FLEXERIL  Take 1 tablet (5 mg total) by mouth 3 (three) times daily as needed for muscle spasms. What changed: See the new instructions.   dexamethasone  1 MG tablet Commonly known as: DECADRON  Take 4 tablets (4 mg total) by mouth daily for 2 days, THEN 2 tablets (2 mg total) daily for 2 days, THEN 1 tablet (1 mg total) daily for 2  days, THEN 0.5 tablets (0.5 mg total) daily for 2 days. Start taking on: July 24, 2023   fluticasone  furoate-vilanterol 200-25 MCG/ACT Aepb Commonly known as: Breo Ellipta  Inhale 1 puff into the lungs daily.   nitroGLYCERIN  0.4 MG SL tablet Commonly known as: Nitrostat  Place 1 tablet (0.4 mg total) under the tongue every 5 (five) minutes as needed for chest pain.   Oxycodone  HCl 10 MG Tabs Take 0.5-1 tablets (5-10 mg total) by mouth every 6 (six) hours as needed for severe pain (pain score 7-10).   phenol 1.4 % Liqd Commonly known as: CHLORASEPTIC Use as directed 1 spray in the mouth or throat as needed for throat irritation / pain.   polyethylene glycol 17 g packet Commonly known as: MIRALAX  / GLYCOLAX  Take 17 g by mouth daily as needed for mild constipation or moderate constipation.   rosuvastatin  5 MG tablet Commonly known as: CRESTOR  Take 3 tablets (15 mg total) by mouth daily. Start taking  on: July 24, 2023 What changed:  how much to take how to take this when to take this additional instructions   sertraline  100 MG tablet Commonly known as: ZOLOFT  Take 1 tablet (100 mg total) by mouth daily.               Discharge Care Instructions  (From admission, onward)           Start     Ordered   07/23/23 0000  Discharge wound care:       Comments: Keep wound clean dry covered  Following up with neurosurgery 07/02   07/23/23 1259             Follow-up Information     Bacigalupo, Jon HERO, MD Follow up.   Specialty: Family Medicine Why: hospital follow up Contact information: [address] [place] [address] [phone]                 Allergies  Allergen Reactions   Methylprednisolone  Palpitations     Subjective: pt feeling well today no concerns, no numbness/weakness, toelrating diet    Discharge Exam: BP (!) 120/52 (BP Location: Right Arm)   Pulse (!) 52   Temp 98.1 F (36.7 C)   Resp 18   Ht 5' 6 (1.676 m)   Wt 82.1 kg   SpO2 96%   BMI 29.21 kg/m  General: Pt is alert, awake, not in acute distress Cardiovascular: RRR, S1/S2 +, no rubs, no gallops Respiratory: CTA bilaterally, no wheezing, no rhonchi Abdominal: Soft, NT, ND, bowel sounds + Extremities: no edema, no cyanosis     The results of significant diagnostics from this hospitalization (including imaging, microbiology, ancillary and laboratory) are listed below for reference.     Microbiology: No results found for this or any previous visit (from the past 240 hours).   Labs: BNP (last 3 results) Recent Labs    06/29/23 0723  BNP 270.2*   Basic Metabolic Panel: Recent Labs  Lab 07/17/23 0619 07/19/23 0337 07/20/23 0444 07/22/23 0432 07/23/23 0523  NA 134* 136 137 138 135  K 5.1 5.5* 5.1 5.5* 4.9  CL 104 104 105 104 103  CO2 20* 25 24 27 24   GLUCOSE 124* 157* 101* 103* 89  BUN 21 25* 32* 28* 33*  CREATININE 1.04* 1.05*  1.03* 1.03* 1.17*  CALCIUM  9.0 8.8* 8.9 9.0 9.0   Liver Function Tests: No results for input(s): AST, ALT, ALKPHOS, BILITOT, PROT, ALBUMIN in the last 168 hours. No  results for input(s): LIPASE, AMYLASE in the last 168 hours. No results for input(s): AMMONIA in the last 168 hours. CBC: Recent Labs  Lab 07/17/23 0619 07/19/23 0337 07/22/23 0432 07/23/23 0523  WBC 14.0* 11.4* 13.2* 14.8*  HGB 10.7* 9.2* 9.1* 9.6*  HCT 31.6* 28.0* 28.9* 29.8*  MCV 95.2 96.9 97.3 97.1  PLT 333 314 413* 435*   Cardiac Enzymes: No results for input(s): CKTOTAL, CKMB, CKMBINDEX, TROPONINI in the last 168 hours. BNP: Invalid input(s): POCBNP CBG: No results for input(s): GLUCAP in the last 168 hours. D-Dimer No results for input(s): DDIMER in the last 72 hours. Hgb A1c No results for input(s): HGBA1C in the last 72 hours. Lipid Profile No results for input(s): CHOL, HDL, LDLCALC, TRIG, CHOLHDL, LDLDIRECT in the last 72 hours. Thyroid  function studies No results for input(s): TSH, T4TOTAL, T3FREE, THYROIDAB in the last 72 hours.  Invalid input(s): FREET3 Anemia work up No results for input(s): VITAMINB12, FOLATE, FERRITIN, TIBC, IRON, RETICCTPCT in the last 72 hours. Urinalysis    Component Value Date/Time   COLORURINE STRAW (A) 07/13/2023 1150   APPEARANCEUR CLEAR (A) 07/13/2023 1150   APPEARANCEUR Hazy (A) 11/04/2021 1536   LABSPEC 1.009 07/13/2023 1150   PHURINE 5.0 07/13/2023 1150   GLUCOSEU NEGATIVE 07/13/2023 1150   HGBUR NEGATIVE 07/13/2023 1150   BILIRUBINUR NEGATIVE 07/13/2023 1150   BILIRUBINUR Negative 11/04/2021 1536   KETONESUR NEGATIVE 07/13/2023 1150   PROTEINUR NEGATIVE 07/13/2023 1150   UROBILINOGEN 0.2 08/23/2017 1419   NITRITE NEGATIVE 07/13/2023 1150   LEUKOCYTESUR TRACE (A) 07/13/2023 1150   Sepsis Labs Recent Labs  Lab 07/17/23 0619 07/19/23 0337 07/22/23 0432 07/23/23 0523  WBC 14.0* 11.4*  13.2* 14.8*   Microbiology No results found for this or any previous visit (from the past 240 hours). Imaging CT Cervical Spine Wo Contrast Result Date: 07/13/2023 CLINICAL DATA:  Myelopathy, acute, cervical spine EXAM: CT CERVICAL SPINE WITHOUT CONTRAST TECHNIQUE: Multidetector CT imaging of the cervical spine was performed without intravenous contrast. Multiplanar CT image reconstructions were also generated. RADIATION DOSE REDUCTION: This exam was performed according to the departmental dose-optimization program which includes automated exposure control, adjustment of the mA and/or kV according to patient size and/or use of iterative reconstruction technique. COMPARISON:  MRI cervical spine 07/09/2023 FINDINGS: Alignment: Grade 1 anterolisthesis of C3 on C4 and C4 on C5. Skull base and vertebrae: Multilevel severe degenerative changes of the spine. Associated severe osseous neural foraminal stenosis of the bilateral C4-C5 levels. No severe osseous central canal stenosis. Posterior disc osteophyte complex formation at the C4-C5 C5-C6 levels. No acute fracture. No aggressive appearing focal osseous lesion or focal pathologic process. Soft tissues and spinal canal: No prevertebral fluid or swelling. No visible canal hematoma. Upper chest: Unremarkable. Other: Enlarged right thyroid  gland with multiple hypodense nodules measuring up to 2 cm. Atherosclerotic plaque of the carotid arteries within the neck. IMPRESSION: 1. No acute displaced fracture or traumatic listhesis of the cervical spine. 2. Incidental right thyroid  nodules, measuring up to 2 cm. Recommend non-emergent thyroid  ultrasound. Reference: J Am Coll Radiol. 2015 Feb;12(2): 143-50. Electronically Signed   By: Morgane  Naveau M.D.   On: 07/13/2023 18:39   DG Cervical Spine With Flex & Extend Result Date: 07/13/2023 CLINICAL DATA:  Neck pain. Clinical concern for cervical spine compression fracture. EXAM: CERVICAL SPINE COMPLETE WITH FLEXION AND  EXTENSION VIEWS COMPARISON:  Cervical spine MRI dated 07/09/2023. Cervical spine radiographs dated 05/24/2023. FINDINGS: Previously described multilevel degenerative changes are stable. No interval compression  fractures. Previously demonstrated grade 1 anterolisthesis at the C3-4 and C4-5 levels increases with flexion and improves with extension with no residual anterolisthesis at the C3-4 level with extension. Normal-appearing prevertebral soft tissues. Right carotid artery atheromatous calcifications. IMPRESSION: 1. No interval compression fractures. 2. Grade 1 anterolisthesis at the C3-4 and C4-5 levels increases with flexion and improves with extension. 3. Stable multilevel degenerative changes. Electronically Signed   By: Elspeth Bathe M.D.   On: 07/13/2023 15:24      Time coordinating discharge: over 30 minutes  SIGNED:  Cleora Karnik DO [facility]

## 2023-07-23 NOTE — Plan of Care (Signed)

## 2023-07-23 NOTE — TOC Transition Note (Signed)
 Transition of Care [facility]) - Discharge Note   Patient Details  Name: Whitney Stuart MRN: [id number] Date of Birth: 05-02-1950  [facility] CM/SW Contact:  Marinda Cooks, RN Phone Number: 07/23/2023, 1:36 PM   Clinical Narrative:     This CM updated by covering MD pt medically cleared to dc today and has active DC order . This CM spoke with .[name]... Admission liaison Tammy and confirmed pt can be be rec'd today.DC transportation confirmed for pt with daughter [name] informing she would provide Medical team updated . No additional DC needs requested by medical team or identified by CM at this time .    Final next level of care: Skilled Nursing Facility Barriers to Discharge: No Barriers Identified   Patient Goals and CMS Choice Patient states their goals for this hospitalization and ongoing recovery are:: SNF/STR CMS Medicare.gov Compare Post Acute Care list provided to:: Patient Represenative (must comment) (Pt' s daughter [name]) Choice offered to / list presented to : Adult Children      Discharge Placement PASRR number recieved: 07/17/23 Existing PASRR number confirmed : 07/17/23          Patient chooses bed at: [facility] Patient to be transferred to facility by: Daughter [name] Name of family member notified: Daughter([name]) [phone] Patient and family notified of of transfer: 07/23/23   Social Drivers of Health (SDOH) Interventions SDOH Screenings   Food Insecurity: No Food Insecurity (07/13/2023)  Housing: Low Risk  (07/13/2023)  Transportation Needs: No Transportation Needs (07/13/2023)  Utilities: Not At Risk (07/13/2023)  Alcohol Screen: Low Risk  (12/28/2022)  Depression (PHQ2-9): Medium Risk (07/11/2023)  Financial Resource Strain: Low Risk  (12/28/2022)  Physical Activity: Insufficiently Active (12/28/2022)  Social Connections: Moderately Isolated (07/13/2023)  Stress: No Stress Concern Present (12/28/2022)  Tobacco Use: Medium Risk (07/13/2023)   Health Literacy: Adequate Health Literacy (12/28/2022)     Readmission Risk Interventions     No data to display

## 2023-07-23 NOTE — Progress Notes (Signed)
 Patient report given to RN Channie. All questions are answered. No any questions or concerned at this time.

## 2023-07-25 ENCOUNTER — Encounter: Payer: Self-pay | Admitting: Neurosurgery

## 2023-07-25 DIAGNOSIS — R2681 Unsteadiness on feet: Secondary | ICD-10-CM | POA: Diagnosis not present

## 2023-07-26 DIAGNOSIS — M4712 Other spondylosis with myelopathy, cervical region: Secondary | ICD-10-CM | POA: Diagnosis not present

## 2023-07-26 NOTE — Progress Notes (Unsigned)
   REFERRING PHYSICIAN:  Myrla Jon HERO, Md [address] [place],  [place] [address]  DOS: 07/16/23  ACDF C3-C6 with corpectomy C4-C5, posterior cervical fusion C3-C6  HISTORY OF PRESENT ILLNESS: Whitney Stuart is status post above surgery. Given flexeril  and oxycodone  10mg  on discharge from the hospital. She was sent out on tapered dose of decadron  as well.   She was discharged to SNF on 07/23/23.   She is not having any significant pain. She still has numbness/tingling in her hands that is constant- the numbness/tingling in her arm is gone! She is working with PT and feels like she is stronger. She is ambulating with a walker. Has been walking down the halls.   She has not restarted ELIQUIS  yet. She is taking oxycodone  prn- usually takes one at night.   She is wearing her collar when up out of bed.    PHYSICAL EXAMINATION:  NEUROLOGICAL:  General: In no acute distress.   Awake, alert, oriented to person, place, and time.  Pupils equal round and reactive to light.  Facial tone is symmetric.    Strength: Side Biceps Triceps Deltoid Interossei Grip Wrist Ext. Wrist Flex.  R 5 5 5 5  4+ 5 5  L 4- 4- 5 5 4+ 4+ 4+   Incisions c/d/i  Imaging:  Nothing new to review.   Assessment / Plan: SAMRA PESCH is doing well s/p above surgery. Treatment options reviewed with patient and following plan made:   - We discussed activity escalation and I have advised the patient to lift up to 10 pounds until 6 weeks after surgery (until follow up with Dr. Sharps).   - Reviewed wound care. Okay to keep incisions covered with dry dressing while at SNF. Change daily.  - Okay to restart ELIQUIS  5mg  bid. Can restart today.  - Continue current medications including prn oxycocone and prn flexeril .  - She needs to wear collar when up and walking. Needs to wear when in car. She can remove collar if she is sitting and watching TV. Can remove collar to sleep and eat as well.  - Follow up as  scheduled in 4 weeks and prn. Will need xrays prior to this visit.   Advised to contact the office if any questions or concerns arise.   Glade Boys PA-C Dept of Neurosurgery

## 2023-07-27 ENCOUNTER — Ambulatory Visit (INDEPENDENT_AMBULATORY_CARE_PROVIDER_SITE_OTHER): Admitting: Orthopedic Surgery

## 2023-07-27 ENCOUNTER — Encounter: Payer: Self-pay | Admitting: Orthopedic Surgery

## 2023-07-27 VITALS — BP 130/56 | Temp 98.7°F | Ht 65.0 in | Wt 180.0 lb

## 2023-07-27 DIAGNOSIS — G959 Disease of spinal cord, unspecified: Secondary | ICD-10-CM

## 2023-07-27 DIAGNOSIS — Z981 Arthrodesis status: Secondary | ICD-10-CM

## 2023-07-27 NOTE — Patient Instructions (Addendum)
 It was nice to see you today.   I am glad that you are feeling better!   Okay to get incision wet in the shower, do not submerge in pool or hot tub. Can keep covered with dry dressing while at facility. Change it daily.   Call if any concerns about the incision such as redness, drainage, or fever/chills.   You need to wear collar when up and walking. Need to wear when in car. You can remove collar if you are sitting and watching TV. Can remove collar to sleep and eat as well.   You can lift up to 10 pounds until your follow up with Dr.  Claudene in 4 weeks.   You can restart your ELIQUIS  today.   We will see you back in 4 weeks for your 6 weeks postop visit. Get xrays 1 hour prior to this visit.   Please call with any questions or concerns.   Glade Boys PA-C [phone]     The physicians and staff at [facility] Neurosurgery at [facility] are committed to providing excellent care. You may receive a survey asking for feedback about your experience at our office. We value you your feedback and appreciate you taking the time to to fill it out. The [facility] leadership team is also available to discuss your experience in person, feel free to contact us  [phone].

## 2023-07-28 DIAGNOSIS — M4712 Other spondylosis with myelopathy, cervical region: Secondary | ICD-10-CM | POA: Diagnosis not present

## 2023-07-28 DIAGNOSIS — I4821 Permanent atrial fibrillation: Secondary | ICD-10-CM | POA: Diagnosis not present

## 2023-07-28 DIAGNOSIS — E871 Hypo-osmolality and hyponatremia: Secondary | ICD-10-CM | POA: Diagnosis not present

## 2023-07-28 DIAGNOSIS — J45909 Unspecified asthma, uncomplicated: Secondary | ICD-10-CM | POA: Diagnosis not present

## 2023-08-02 DIAGNOSIS — E871 Hypo-osmolality and hyponatremia: Secondary | ICD-10-CM | POA: Diagnosis not present

## 2023-08-02 DIAGNOSIS — M4712 Other spondylosis with myelopathy, cervical region: Secondary | ICD-10-CM | POA: Diagnosis not present

## 2023-08-02 DIAGNOSIS — I4821 Permanent atrial fibrillation: Secondary | ICD-10-CM | POA: Diagnosis not present

## 2023-08-02 DIAGNOSIS — J45909 Unspecified asthma, uncomplicated: Secondary | ICD-10-CM | POA: Diagnosis not present

## 2023-08-04 ENCOUNTER — Telehealth: Payer: Self-pay

## 2023-08-04 DIAGNOSIS — K5909 Other constipation: Secondary | ICD-10-CM | POA: Diagnosis not present

## 2023-08-04 DIAGNOSIS — Z741 Need for assistance with personal care: Secondary | ICD-10-CM | POA: Diagnosis not present

## 2023-08-04 DIAGNOSIS — M4712 Other spondylosis with myelopathy, cervical region: Secondary | ICD-10-CM | POA: Diagnosis not present

## 2023-08-04 DIAGNOSIS — M4802 Spinal stenosis, cervical region: Secondary | ICD-10-CM | POA: Diagnosis not present

## 2023-08-04 DIAGNOSIS — J45909 Unspecified asthma, uncomplicated: Secondary | ICD-10-CM | POA: Diagnosis not present

## 2023-08-04 DIAGNOSIS — Z87891 Personal history of nicotine dependence: Secondary | ICD-10-CM | POA: Diagnosis not present

## 2023-08-04 DIAGNOSIS — Z4789 Encounter for other orthopedic aftercare: Secondary | ICD-10-CM | POA: Diagnosis not present

## 2023-08-04 DIAGNOSIS — I4891 Unspecified atrial fibrillation: Secondary | ICD-10-CM | POA: Diagnosis not present

## 2023-08-04 DIAGNOSIS — R2681 Unsteadiness on feet: Secondary | ICD-10-CM | POA: Diagnosis not present

## 2023-08-04 DIAGNOSIS — M62838 Other muscle spasm: Secondary | ICD-10-CM | POA: Diagnosis not present

## 2023-08-04 DIAGNOSIS — E789 Disorder of lipoprotein metabolism, unspecified: Secondary | ICD-10-CM | POA: Diagnosis not present

## 2023-08-04 DIAGNOSIS — M6259 Muscle wasting and atrophy, not elsewhere classified, multiple sites: Secondary | ICD-10-CM | POA: Diagnosis not present

## 2023-08-04 DIAGNOSIS — M51369 Other intervertebral disc degeneration, lumbar region without mention of lumbar back pain or lower extremity pain: Secondary | ICD-10-CM | POA: Diagnosis not present

## 2023-08-04 NOTE — Transitions of Care (Post Inpatient/ED Visit) (Signed)
   08/04/2023  Name: Whitney Stuart MRN: [id number] DOB: 1950-03-16  Today's TOC FU Call Status: Today's TOC FU Call Status:: Unsuccessful Call (1st Attempt) Unsuccessful Call (1st Attempt) Date: 08/04/23  Attempted to reach the patient regarding the most recent Inpatient/ED visit.  Follow Up Plan: Additional outreach attempts will be made to reach the patient to complete the Transitions of Care (Post Inpatient/ED visit) call.   Signature Julian Lemmings, LPN [facility] Nurse Health Advisor Direct Dial [phone]

## 2023-08-05 NOTE — Transitions of Care (Post Inpatient/ED Visit) (Signed)
 08/05/2023  Name: Whitney Stuart MRN: [id number] DOB: 19-Mar-1950  Today's TOC FU Call Status: Today's TOC FU Call Status:: Successful TOC FU Call Completed Unsuccessful Call (1st Attempt) Date: 08/04/23 [facility] FU Call Complete Date: 08/05/23 Patient's Name and Date of Birth confirmed.  Transition Care Management Follow-up Telephone Call Date of Discharge: 08/03/23 Discharge Facility: Other ([facility]) Name of Other ([facility]) Discharge Facility: [facility] Type of Discharge: Inpatient Admission Primary Inpatient Discharge Diagnosis:: spinal stenosis How have you been since you were released from the hospital?: Better Any questions or concerns?: No  Items Reviewed: Did you receive and understand the discharge instructions provided?: Yes Medications obtained,verified, and reconciled?: Yes (Medications Reviewed) Any new allergies since your discharge?: No Dietary orders reviewed?: Yes Do you have support at home?: Yes People in Home [RPT]: child(ren), adult  Medications Reviewed Today: Medications Reviewed Today     Reviewed by Emmitt Pan, LPN (Licensed Practical Nurse) on 08/05/23 at [phone]  Med List Status: <None>   Medication Order Taking? Sig Documenting Provider Last Dose Status Informant  acetaminophen  (TYLENOL ) 325 MG tablet [id number] Yes Take 2 tablets (650 mg total) by mouth every 6 (six) hours as needed for mild pain (pain score 1-3), fever or headache (or Fever >/= 101). Alexander, Natalie, DO  Active   albuterol  (VENTOLIN  HFA) 108 [phone] Base) MCG/ACT inhaler [id number] Yes Inhale 2 puffs into the lungs every 6 (six) hours as needed for wheezing or shortness of breath. Myrla Jon HERO, MD  Active Pharmacy Records, Child           Med Note [facility], [name] A   Wed Jun 29, 2023  1:46 PM) PRN  amiodarone  (PACERONE ) 200 MG tablet [id number] Yes Take 1 tablet (200 mg total) by mouth at bedtime. Alexander, Natalie, DO  Active   apixaban  (ELIQUIS ) 5 MG TABS  tablet [id number] Yes Take 1 tablet (5 mg total) by mouth 2 (two) times daily. Maree Hue, MD  Active Child, Pharmacy Records  clonazePAM  (KLONOPIN ) 0.5 MG tablet [id number] Yes Take 1 tablet (0.5 mg total) by mouth 2 (two) times daily as needed (anxiety). Alexander, Natalie, DO  Active   cyclobenzaprine  (FLEXERIL ) 5 MG tablet [id number] Yes Take 1 tablet (5 mg total) by mouth 3 (three) times daily as needed for muscle spasms. Alexander, Natalie, DO  Active   dexamethasone  (DECADRON ) 1 MG tablet [id number]  Summary: Multiple Dosages: Starting Sun 07/24/2023, Until Mon 07/25/2023, THEN Starting Tue 07/26/2023, Until Wed 07/27/2023, THEN Starting Thu 07/28/2023, Until Fri 07/29/2023, THEN Starting Sat 07/30/2023, Until Sun 07/31/2023 Take 4 tablets (4 mg total) by mouth daily for 2 days, THEN 2 tablets (2 mg total) daily for 2 days, THEN 1 tablet (1 mg total) daily for 2 days, THEN 0.5 tablets (0.5 mg total) daily for 2 days., No Print Dose, Route, Frequency: 4 mg, Daily Starting Sun 07/24/2023, For 2 days, THEN 2 mg, Daily Starting Tue 07/26/2023, For 2 days, THEN 1 mg, Daily Starting Thu 07/28/2023, For 2 days, THEN 0.5 mg, Daily Starting Sat 07/30/2023, For 2 days  Patient not taking: Reported on 08/05/2023   [provider]  Active   fluticasone  furoate-vilanterol (BREO ELLIPTA ) 200-25 MCG/ACT AEPB [id number] Yes Inhale 1 puff into the lungs daily. Myrla Jon HERO, MD  Active Pharmacy Records, Child  Naloxone HCl 4 MG/0.25ML BERNICE [id number]  Place 1 spray into the nose 3 (three) times daily as needed.  Patient not taking: Reported on 08/05/2023   [provider]  Active  nitroGLYCERIN  (NITROSTAT ) 0.4 MG SL tablet [id number] Yes Place 1 tablet (0.4 mg total) under the tongue every 5 (five) minutes as needed for chest pain. Maree Hue, MD  Active Child, Pharmacy Records           Med Note [facility], [place] A   Wed Jul 13, 2023  3:05 PM) prn  oxyCODONE  10 MG TABS [id number] Yes Take 0.5-1 tablets (5-10 mg  total) by mouth every 6 (six) hours as needed for severe pain (pain score 7-10). Alexander, Natalie, DO  Active   phenol (CHLORASEPTIC) 1.4 % LIQD [id number]  Use as directed 1 spray in the mouth or throat as needed for throat irritation / pain.  Patient not taking: Reported on 08/05/2023   Alexander, Natalie, DO  Active   polyethylene glycol (MIRALAX  / GLYCOLAX ) 17 g packet [id number]  Take 17 g by mouth daily as needed for mild constipation or moderate constipation.  Patient not taking: Reported on 08/05/2023   Alexander, Natalie, DO  Active   rosuvastatin  (CRESTOR ) 5 MG tablet [id number] Yes Take 3 tablets (15 mg total) by mouth daily. Alexander, Natalie, DO  Active   sertraline  (ZOLOFT ) 100 MG tablet [id number] Yes Take 1 tablet (100 mg total) by mouth daily. Myrla Jon HERO, MD  Active Child, Pharmacy Records            Home Care and Equipment/Supplies: Were Home Health Services Ordered?: Yes Name of Home Health Agency:: unknown Has Agency set up a time to come to your home?: Yes First Home Health Visit Date: 08/04/23  Functional Questionnaire: Do you need assistance with bathing/showering or dressing?: Yes Do you need assistance with meal preparation?: No Do you need assistance with eating?: No Do you have difficulty maintaining continence: No Do you need assistance with getting out of bed/getting out of a chair/moving?: No Do you have difficulty managing or taking your medications?: No  Follow up appointments reviewed: PCP Follow-up appointment confirmed?: Yes Date of PCP follow-up appointment?: 08/16/23 Follow-up Provider: [facility] Follow-up appointment confirmed?: NA Do you need transportation to your follow-up appointment?: No Do you understand care options if your condition(s) worsen?: Yes-patient verbalized understanding    SIGNATURE Julian Lemmings, LPN [facility] Nurse Health Advisor Direct Dial [phone]

## 2023-08-09 ENCOUNTER — Encounter: Payer: Self-pay | Admitting: Cardiovascular Disease

## 2023-08-09 ENCOUNTER — Ambulatory Visit: Attending: Cardiovascular Disease | Admitting: Cardiovascular Disease

## 2023-08-09 VITALS — BP 150/70 | HR 56 | Ht 65.0 in | Wt 182.2 lb

## 2023-08-09 DIAGNOSIS — E785 Hyperlipidemia, unspecified: Secondary | ICD-10-CM

## 2023-08-09 DIAGNOSIS — I4819 Other persistent atrial fibrillation: Secondary | ICD-10-CM | POA: Diagnosis not present

## 2023-08-09 DIAGNOSIS — I10 Essential (primary) hypertension: Secondary | ICD-10-CM

## 2023-08-09 DIAGNOSIS — I340 Nonrheumatic mitral (valve) insufficiency: Secondary | ICD-10-CM

## 2023-08-09 NOTE — Patient Instructions (Signed)
 Medication Instructions:  No changes *If you need a refill on your cardiac medications before your next appointment, please call your pharmacy*  Lab Work: None ordered If you have labs (blood work) drawn today and your tests are completely normal, you will receive your results only by: MyChart Message (if you have MyChart) OR A paper copy in the mail If you have any lab test that is abnormal or we need to change your treatment, we will call you to review the results.  Testing/Procedures: None ordered  Follow-Up: At [facility], you and your health needs are our priority.  As part of our continuing mission to provide you with exceptional heart care, our providers are all part of one team.  This team includes your primary Cardiologist (physician) and Advanced Practice Providers or APPs (Physician Assistants and Nurse Practitioners) who all work together to provide you with the care you need, when you need it.  Your next appointment:   6 month(s)  Provider:   You may see Deatrice Cage, MD or one of the following Advanced Practice Providers on your designated Care Team:   Lonni Meager, NP Lesley Maffucci, PA-C Bernardino Bring, PA-C Cadence St. Hilaire, PA-C Tylene Lunch, NP Barnie Hila, NP    We recommend signing up for the patient portal called MyChart.  Sign up information is provided on this After Visit Summary.  MyChart is used to connect with patients for Virtual Visits (Telemedicine).  Patients are able to view lab/test results, encounter notes, upcoming appointments, etc.  Non-urgent messages can be sent to your provider as well.   To learn more about what you can do with MyChart, go to [email or web address].   Other Instructions A referral has been placed to Electrophysiology

## 2023-08-09 NOTE — Progress Notes (Signed)
 Cardiology Office Note   Date:  08/09/2023   ID:  AMAZING COWMAN, DOB October 11, 1950, MRN [id number]  PCP:  Myrla Jon HERO, MD  Cardiologist:   Deatrice Cage, MD   Chief Complaint  Patient presents with   Follow-up    Post cath c/o sob and edema ankles/feet. Pt would like to discuss if she should take Lisinopril . Meds reviewed verbally with pt.      History of Present Illness: Whitney Stuart is a 73 y.o. female who is here today for follow-up visit regarding mitral regurgitation and paroxysmal atrial fibrillation  She has known history of essential hypertension, hyperlipidemia and secondhand smoking.  She is a lifelong non-smoker but has been exposed to secondhand smoking from her husband who smoked for many years.  She was evaluated in October 2021 for chest pain and shortness of breath.  Echocardiogram showed normal LV systolic function, grade 1 diastolic dysfunction, mild to moderate mitral regurgitation and mild to moderate pulmonary hypertension with peak systolic pressure of 47 mmHg.  Cardiac CTA showed a calcium  score of 0 with normal coronary arteries. Home sleep study in June 2022 was normal.  She was found to have A-fib with RVR in April 2024.  She was placed on anticoagulation with Eliquis  and metoprolol  for rate control.  Metoprolol  was subsequently decreased after she converted to sinus rhythm with bradycardia.  She was hospitalized in June with chest pain and elevated troponin in the setting of atrial fibrillation with RVR.  Troponin was elevated.  She underwent cardiac catheterization which showed normal coronary arteries and normal left ventricular end-diastolic pressure.  Ejection fraction was normal.  She was readmitted the same month for progressive decline in mobility due to severe cervical spine stenosis.  She underwent cervical spine surgery and she is currently wearing a neck brace.  She has been doing reasonably well from a cardiac standpoint with no  recurrent atrial fibrillation.  No chest pain or shortness of breath.  She did notice worsening lower extremity edema.  Her lisinopril  was discontinued after her most recent hospital discharge due to hyperkalemia.    Past Medical History:  Diagnosis Date   Arthritis    Asthma    Asthma    Chest pain    a. 11/2019 Cor CTA: Calcium  score equals 0.  Normal coronary arteries.   CKD (chronic kidney disease)    Diastolic dysfunction    a. 11/2019 Echo: EF 60-65%.  Grade 1 diastolic dysfunction; b. 12/2021 Echo: EF 60-65%, GrI DD; c. 05/2022 Echo: EF 60-65%, no rwma. Nl RV fxn. RVSP 43.44mmHg. Sev dil LA. Mod MR.   GERD (gastroesophageal reflux disease)    History of cervical cancer    Hyperlipidemia    Hypertension    Mitral regurgitation    a. 12/22023 Echo: Mild-mod MR; b. 05/2022 Echo: Mod MR.   PAH (pulmonary artery hypertension) (HCC)    a. 11/2019 Echo: RVSP ; b .12/2021 Echo: RVSP 39.83mmHg; c. 05/2022 Echo: RVSP 43.62mmHg.   Persistent atrial fibrillation (HCC)    a. Dx 04/2022-->Eliquis  (CHA2DS2VASc = 3); b. 06/2022 Zio: predominantly sinus rhythm at a rate of 66 bpm (range 44-154), 2 brief runs of SVT (fastest 154 bpm x 20 beats) and otherwise rare PACs and PVCs.   Sleep-disordered breathing     Past Surgical History:  Procedure Laterality Date   ABDOMINAL HYSTERECTOMY  1999   total   ANTERIOR CERVICAL CORPECTOMY N/A 07/16/2023   Procedure: C4-5 Anterior Cervical Corpectomy, arthrodesis and C3-6  Anterior instrumentation;  Surgeon: [name] ORN, MD;  Location: [facility] ORS;  Service: Neurosurgery;  Laterality: N/A;   HAND SURGERY Bilateral 2003   carpal tunnel   LEFT HEART CATH AND CORONARY ANGIOGRAPHY N/A 06/30/2023   Procedure: LEFT HEART CATH AND CORONARY ANGIOGRAPHY;  Surgeon: Darron Deatrice LABOR, MD;  Location: [facility] INVASIVE CV LAB;  Service: Cardiovascular;  Laterality: N/A;   LIPOMA EXCISION     POSTERIOR CERVICAL FUSION/FORAMINOTOMY N/A 07/16/2023   Procedure: C3-C6  Posterior Cervical  Fusion;  Surgeon: [name] ORN, MD;  Location: [facility] ORS;  Service: Neurosurgery;  Laterality: N/A;     Current Outpatient Medications  Medication Sig Dispense Refill   acetaminophen  (TYLENOL ) 325 MG tablet Take 2 tablets (650 mg total) by mouth every 6 (six) hours as needed for mild pain (pain score 1-3), fever or headache (or Fever >/= 101).     albuterol  (VENTOLIN  HFA) 108 (90 Base) MCG/ACT inhaler Inhale 2 puffs into the lungs every 6 (six) hours as needed for wheezing or shortness of breath. 8.5 each 2   amiodarone  (PACERONE ) 200 MG tablet Take 1 tablet (200 mg total) by mouth at bedtime.     apixaban  (ELIQUIS ) 5 MG TABS tablet Take 1 tablet (5 mg total) by mouth 2 (two) times daily.     clonazePAM  (KLONOPIN ) 0.5 MG tablet Take 1 tablet (0.5 mg total) by mouth 2 (two) times daily as needed (anxiety). 10 tablet 0   cyclobenzaprine  (FLEXERIL ) 5 MG tablet Take 1 tablet (5 mg total) by mouth 3 (three) times daily as needed for muscle spasms.     fluticasone  furoate-vilanterol (BREO ELLIPTA ) 200-25 MCG/ACT AEPB Inhale 1 puff into the lungs daily. 180 each 0   nitroGLYCERIN  (NITROSTAT ) 0.4 MG SL tablet Place 1 tablet (0.4 mg total) under the tongue every 5 (five) minutes as needed for chest pain. 30 tablet 3   oxyCODONE  10 MG TABS Take 0.5-1 tablets (5-10 mg total) by mouth every 6 (six) hours as needed for severe pain (pain score 7-10). 30 tablet 0   rosuvastatin  (CRESTOR ) 5 MG tablet Take 3 tablets (15 mg total) by mouth daily.     sertraline  (ZOLOFT ) 100 MG tablet Take 1 tablet (100 mg total) by mouth daily. 90 tablet 1   dexamethasone  (DECADRON ) 1 MG tablet Summary: Multiple Dosages: Starting Sun 07/24/2023, Until Mon 07/25/2023, THEN Starting Tue 07/26/2023, Until Wed 07/27/2023, THEN Starting Thu 07/28/2023, Until Fri 07/29/2023, THEN Starting Sat 07/30/2023, Until Sun 07/31/2023 Take 4 tablets (4 mg total) by mouth daily for 2 days, THEN 2 tablets (2 mg total) daily for 2 days, THEN 1  tablet (1 mg total) daily for 2 days, THEN 0.5 tablets (0.5 mg total) daily for 2 days., No Print Dose, Route, Frequency: 4 mg, Daily Starting Sun 07/24/2023, For 2 days, THEN 2 mg, Daily Starting Tue 07/26/2023, For 2 days, THEN 1 mg, Daily Starting Thu 07/28/2023, For 2 days, THEN 0.5 mg, Daily Starting Sat 07/30/2023, For 2 days (Patient not taking: Reported on 08/09/2023)     Naloxone HCl 4 MG/0.25ML LIQD Place 1 spray into the nose 3 (three) times daily as needed. (Patient not taking: Reported on 08/09/2023)     phenol (CHLORASEPTIC) 1.4 % LIQD Use as directed 1 spray in the mouth or throat as needed for throat irritation / pain. (Patient not taking: Reported on 08/09/2023)     polyethylene glycol (MIRALAX  / GLYCOLAX ) 17 g packet Take 17 g by mouth daily as needed for mild constipation  or moderate constipation. (Patient not taking: Reported on 08/09/2023)     No current facility-administered medications for this visit.    Allergies:   Methylprednisolone     Social History:  The patient  reports that she quit smoking about 26 years ago. Her smoking use included cigarettes. She started smoking about 32 years ago. She has a 3 pack-year smoking history. She has never used smokeless tobacco. She reports that she does not drink alcohol and does not use drugs.   Family History:  The patient's family history includes Congestive Heart Failure in her daughter and mother; Heart disease in her brother and mother; Heart murmur in her sister; Hypertension in her brother, brother, brother, brother, brother, brother, brother, brother, father, mother, sister, sister, and sister; Kidney disease in her father; Lung cancer in her brother; Mental illness in her maternal aunt; Ovarian cancer in her sister; Sleep apnea in her brother; Stroke in her mother.    ROS:  Please see the history of present illness.   Otherwise, review of systems are positive for none.   All other systems are reviewed and negative.    PHYSICAL  EXAM: VS:  BP (!) 150/70 (BP Location: Left Arm, Patient Position: Sitting, Cuff Size: Normal)   Pulse (!) 56   Ht 5' 5 (1.651 m)   Wt 182 lb 4 oz (82.7 kg)   SpO2 98%   BMI 30.33 kg/m  , BMI Body mass index is 30.33 kg/m. GEN: Well nourished, well developed, in no acute distress  HEENT: normal  Neck: no JVD, carotid bruits, or masses Cardiac: Regular rate and rhythm and mildly bradycardic; no  rubs, or gallops.  1/ 6 systolic murmur in the aortic area.  Trace bilateral leg edema. Respiratory:  clear to auscultation bilaterally, normal work of breathing GI: soft, nontender, nondistended, + BS MS: no deformity or atrophy  Skin: warm and dry, no rash Neuro:  Strength and sensation are intact Psych: euthymic mood, full affect   EKG:  EKG is ordered today. The ekg ordered today demonstrates : Sinus bradycardia       Recent Labs: 06/29/2023: B Natriuretic Peptide 270.2 07/13/2023: ALT 14 07/15/2023: TSH 2.043 07/23/2023: BUN 33; Creatinine, Ser 1.17; Hemoglobin 9.6; Platelets 435; Potassium 4.9; Sodium 135    Lipid Panel    Component Value Date/Time   CHOL 161 02/21/2023 0921   TRIG 122 02/21/2023 0921   HDL 53 02/21/2023 0921   CHOLHDL 3.0 02/21/2023 0921   LDLCALC 86 02/21/2023 0921      Wt Readings from Last 3 Encounters:  08/09/23 182 lb 4 oz (82.7 kg)  07/27/23 180 lb (81.6 kg)  07/13/23 181 lb (82.1 kg)          11/22/2019   11:21 AM  PAD Screen  Previous PAD dx? No  Previous surgical procedure? No  Pain with walking? No  Feet/toe relief with dangling? No  Painful, non-healing ulcers? No  Extremities discolored? No      ASSESSMENT AND PLAN:  1.  Persistent atrial fibrillation: She had recent episode of atrial fibrillation with RVR complicated by chest pain and supply/demand ischemia.  She is now maintaining in sinus rhythm with amiodarone  which will be continued at 200 mg once daily.  Continue anticoagulation with Eliquis . I am referring her to EP  to discuss alternatives to amiodarone  including ablation or other antiarrhythmic medications.  2.  Essential hypertension: Blood pressure is mildly elevated.  Lisinopril  was stopped recently due to hyperkalemia.  Amlodipine  is not  a good option right now in the setting of which increased lower extremity edema.  Will monitor for now and consider adding an ARB.  3.  Hyperlipidemia: Currently on small dose atorvastatin  10 mg daily.  Most recent lipid profile showed an LDL of 100.  4.  Mitral regurgitation: Most recent echocardiogram in May 2024 showed moderate regurgitation.  Recommend repeat echocardiogram in 2026.  5.  Chronic venous insufficiency: She has lower extremity edema which is likely due to chronic venous insufficiency.  I advised her to elevate her legs during the day and consider using knee-high support stockings during the day.    Disposition:   FU in 6 months  Signed,  Deatrice Cage, MD  08/09/2023 1:41 PM    [facility]

## 2023-08-10 DIAGNOSIS — M6259 Muscle wasting and atrophy, not elsewhere classified, multiple sites: Secondary | ICD-10-CM | POA: Diagnosis not present

## 2023-08-11 DIAGNOSIS — M6259 Muscle wasting and atrophy, not elsewhere classified, multiple sites: Secondary | ICD-10-CM | POA: Diagnosis not present

## 2023-08-11 DIAGNOSIS — R2681 Unsteadiness on feet: Secondary | ICD-10-CM | POA: Diagnosis not present

## 2023-08-11 DIAGNOSIS — Z741 Need for assistance with personal care: Secondary | ICD-10-CM | POA: Diagnosis not present

## 2023-08-11 DIAGNOSIS — Z87891 Personal history of nicotine dependence: Secondary | ICD-10-CM | POA: Diagnosis not present

## 2023-08-11 DIAGNOSIS — I4891 Unspecified atrial fibrillation: Secondary | ICD-10-CM | POA: Diagnosis not present

## 2023-08-11 DIAGNOSIS — M51369 Other intervertebral disc degeneration, lumbar region without mention of lumbar back pain or lower extremity pain: Secondary | ICD-10-CM | POA: Diagnosis not present

## 2023-08-11 DIAGNOSIS — M62838 Other muscle spasm: Secondary | ICD-10-CM | POA: Diagnosis not present

## 2023-08-11 DIAGNOSIS — J45909 Unspecified asthma, uncomplicated: Secondary | ICD-10-CM | POA: Diagnosis not present

## 2023-08-11 DIAGNOSIS — M4802 Spinal stenosis, cervical region: Secondary | ICD-10-CM | POA: Diagnosis not present

## 2023-08-11 DIAGNOSIS — Z4789 Encounter for other orthopedic aftercare: Secondary | ICD-10-CM | POA: Diagnosis not present

## 2023-08-11 DIAGNOSIS — M4712 Other spondylosis with myelopathy, cervical region: Secondary | ICD-10-CM | POA: Diagnosis not present

## 2023-08-11 DIAGNOSIS — K5909 Other constipation: Secondary | ICD-10-CM | POA: Diagnosis not present

## 2023-08-11 DIAGNOSIS — E789 Disorder of lipoprotein metabolism, unspecified: Secondary | ICD-10-CM | POA: Diagnosis not present

## 2023-08-16 ENCOUNTER — Encounter: Payer: Self-pay | Admitting: Family Medicine

## 2023-08-16 ENCOUNTER — Ambulatory Visit (INDEPENDENT_AMBULATORY_CARE_PROVIDER_SITE_OTHER): Admitting: Family Medicine

## 2023-08-16 VITALS — BP 127/85 | HR 52 | Temp 98.2°F | Ht 65.0 in | Wt 184.1 lb

## 2023-08-16 DIAGNOSIS — G959 Disease of spinal cord, unspecified: Secondary | ICD-10-CM

## 2023-08-16 DIAGNOSIS — I4891 Unspecified atrial fibrillation: Secondary | ICD-10-CM

## 2023-08-16 DIAGNOSIS — R600 Localized edema: Secondary | ICD-10-CM

## 2023-08-16 DIAGNOSIS — I10 Essential (primary) hypertension: Secondary | ICD-10-CM

## 2023-08-16 NOTE — Progress Notes (Unsigned)
 Established Patient Office Visit  Subjective   Patient ID: Whitney Stuart, female    DOB: Aug 23, 1950  Age: 73 y.o. MRN: [id number]  Chief Complaint  Patient presents with   Follow-up    HFU 6/18-6/28, Rehab 7/9 D/c with home health skilled nursing, occupational and PT- states they have not heard from physical therapy yet  L side of neck is hurting, has f/u with surgeon on 7/30    Leg Swelling    Bilateral leg/ feet swelling since 7/14, notices every day. States L is worse than R     Whitney Stuart is a 73 yo female with a history of HTN, atrial fibrillation, CKD, HLD, and recent cervical corpectomy who presents to the clinic for follow up after recent hospitalization for cervical myelopathy and corpectomy.  On interview, she reports feeling much better after having surgery. She states that she has been gaining back her motor and sensory function in her extremities since the operation. She has been experiencing some residual numbness in her fingertips, but her surgeon has explained that it may take time for the numbness to resolve. She reports being able to slowly return to her ADLs independently. She is able shower, dress herself, and make breakfast without help. She has been working with home health skill nursing (who has been monitoring her surgical site) and occupational therapy (who has been helping with her hand mobility/strength). She is still waiting to hear from physical therapy. Overall, she has been not experienced any lightheadedness, dizziness, falls, or pain since discharge with intact bladder and bowel function.  Today, she has concerns regarding an ache in her left trapezius. She says that both sides of her neck have been aching since surgery. She describes the ache as tension, but denies any shooting or burning pain. Her right trapezius ache resolved shortly after surgery, but the left trapezius ache has yet to resolve. Her daughter mentioned that the patient has a history of  favoring her life side prior to and after surgery, possibly contributing to the continued ache. She endorses some radiation to her shoulder, but denies any changes in sensation or strength. She has not tried any over the counter treatments for her ache.  Additionally, she reports concerns regarding left ankle edema. She states that the swelling has worsened since returning home from [facility] skilled nursing facility. She reports that the swelling is worst in the evening, but that propping her feet up helps. She denies any pain, warmth, redness, or tenderness. She has not tried any other treatments for her edema.  Finally, she reported concerns regarding follow up for her hypertension.    {History (Optional):23778}  ROS    Objective:     BP 127/85 (BP Location: Left Arm, Patient Position: Sitting, Cuff Size: Normal)   Pulse (!) 52   Temp 98.2 F (36.8 C) (Oral)   Ht 5' 5 (1.651 m)   Wt 184 lb 1.6 oz (83.5 kg)   SpO2 97%   BMI 30.64 kg/m  {Vitals History (Optional):23777}  Physical Exam Vitals reviewed.  Constitutional:      General: She is not in acute distress.    Appearance: Normal appearance. She is not ill-appearing.  HENT:     Head: Normocephalic.     Right Ear: External ear normal.     Left Ear: External ear normal.     Nose: Nose normal.     Mouth/Throat:     Mouth: Mucous membranes are moist.     Pharynx: Oropharynx is  clear.  Eyes:     General: No scleral icterus.    Conjunctiva/sclera: Conjunctivae normal.     Pupils: Pupils are equal, round, and reactive to light.  Cardiovascular:     Rate and Rhythm: Normal rate.     Pulses: Normal pulses.  Pulmonary:     Effort: Pulmonary effort is normal. No respiratory distress.     Breath sounds: Normal breath sounds. No wheezing.  Abdominal:     General: There is no distension.     Palpations: Abdomen is soft.     Tenderness: There is no abdominal tenderness.  Musculoskeletal:        General: Normal range of  motion.     Cervical back: No tenderness (below cervical collar).     Right lower leg: No edema.     Left lower leg: Edema (confined to the ankle) present.  Skin:    General: Skin is warm and dry.  Neurological:     General: No focal deficit present.     Mental Status: She is alert and oriented to person, place, and time.     Sensory: Sensation is intact.     Motor: Motor function is intact. No weakness.     Deep Tendon Reflexes:     Reflex Scores:      Tricep reflexes are 2+ on the right side and 3+ on the left side.      Bicep reflexes are 2+ on the right side and 2+ on the left side.      Patellar reflexes are 2+ on the right side and 3+ on the left side.      Achilles reflexes are 3+ on the right side and 2+ on the left side. Psychiatric:        Mood and Affect: Mood normal.      No results found for any visits on 08/16/23.  {Labs (Optional):23779}  The ASCVD Risk score (Arnett DK, et al., 2019) failed to calculate for the following reasons:   Risk score cannot be calculated because patient has a medical history suggesting prior/existing ASCVD    Assessment & Plan:   Problem List Items Addressed This Visit       Cardiovascular and Mediastinum   Essential (primary) hypertension   Currently controlled after recent discharge from the hospital following surgery. Reports that home blood pressure readings have been between 120-140 systolic. Previously on lisinopril  for control prior to recent hospitalization for surgery. Patient's daughter reports it was discontinued because patient became hypotensive on it in the hospital, but cardiology refers to hyperkalemia. Will schedule follow up to reassess and restart appropriate medication regimen. - Schedule one month follow up for HTN management - Continue to monitor home blood pressures - Consider restarting blood pressure medication regimen which included Lisinopril       Atrial fibrillation with rapid ventricular response (HCC)    Previously managed with amiodarone  and Eliquis  therapy prior to most recent hospitalization. Eliquis  was discontinued upon admission for surgical procedure. Patient reports recently restarting her Eliquis  after being given the okay by another provider. - Continue amiodarone  and Eliquis  - Schedule one month follow up to manage chronic conditions - Continue to monitor LFTs, PFTs, and TFTs with amiodarone  use        Nervous and Auditory   Cervical myelopathy (HCC) - Primary   Substantial improvement of weakness and coordination in her extremities, especially left upper and lower, following corpectomy. Intact motor function and sensation in bilateral upper and lower extremities with present  deep tendon reflexes. Able to ambulate with walker and perform ADLs. Has a follow up appointment with neurosurgery on 07/30 to discuss removal of cervical collar. Experiencing residual numbness of fingertips bilaterally which has bene communicated to the surgeon by her. Also experiencing muscle tightness in the left trapezius after prolonged immobilization. Has outpatient nursing and OT set up with PT pending. - Follow up with neurosurgery on 07/30 to discuss further recovery - Continue to engage in Nursing and OT visits - We will help follow up with PT to ensure you get connected      Other Visit Diagnoses       Lower extremity edema          Status post cervical corpectomy Overall, doing well after cervical corpectomy. Has regained motor and sensory function in her extremities and is able to ambulate with walker. Slowly returning to her ADLs independently. Has been working closely with home nursing and OT. Still waiting to hear from PT for further follow up. Has not experienced any falls or adverse symptoms. Experiencing left sided trapezius tightness because of constrained posture following surgery. - Continue to follow up with neurosurgery as recommended - Continue to engage with home nursing and OT -  We will help reach out to connect with home PT - Follow up in one month for further management of chronic conditions after recent significant health interventions  Acute Left Trapezius Pain Has been experiencing acute left trapezius ache since recent surgery. Endorses aching and tightness to the shoulder, but denies further radiation of pain. Experienced similar ache in right trapezius that resolved shortly after surgery. Has a history of favoring her left side prior to and after surgery. Denies any changes in sensation or strength and has not tried any over the counter treatments. - Encouraged patient to apply OTC topical pain relief ointments and heat as needed - Once neurosurgery clears, can try gentle passive stretching exercises - Engage with PT to help address tightness and ache  Left Lower Extremity Edema Experiencing left ankle edema that has worsened since returning home from [facility] skilled nursing facility. Reports it is worst in the evening with improvement after propping her legs up. Denies any pain, warmth, redness, or tenderness. - Encourage use of compression socks and propping legs to help edema - Discussed resolution of symptoms as patient becomes more active after recent period of immobilization  Return in about 4 weeks (around 09/13/2023) for chronic disease f/u.    Elia LULLA Blanch, Medical Student

## 2023-08-16 NOTE — Assessment & Plan Note (Addendum)
 Currently controlled after recent discharge from the hospital following surgery. Reports that home blood pressure readings have been between 120-140 systolic. Previously on lisinopril  for control prior to recent hospitalization for surgery. Patient's daughter reports it was discontinued because patient became hypotensive on it in the hospital, but cardiology refers to hyperkalemia. Will schedule follow up to reassess and restart appropriate medication regimen. - Schedule one month follow up for HTN management - Continue to monitor home blood pressures - Consider restarting blood pressure medication regimen which included Lisinopril 

## 2023-08-16 NOTE — Assessment & Plan Note (Signed)
 Substantial improvement of weakness and coordination in her extremities, especially left upper and lower, following corpectomy. Intact motor function and sensation in bilateral upper and lower extremities with present deep tendon reflexes. Able to ambulate with walker and perform ADLs. Has a follow up appointment with neurosurgery on 07/30 to discuss removal of cervical collar. Experiencing residual numbness of fingertips bilaterally which has bene communicated to the surgeon by her. Also experiencing muscle tightness in the left trapezius after prolonged immobilization. Has outpatient nursing and OT set up with PT pending. - Follow up with neurosurgery on 07/30 to discuss further recovery - Continue to engage in Nursing and OT visits - We will help follow up with PT to ensure you get connected

## 2023-08-16 NOTE — Assessment & Plan Note (Signed)
 Previously managed with amiodarone  and Eliquis  therapy prior to most recent hospitalization. Eliquis  was discontinued upon admission for surgical procedure. Patient reports recently restarting her Eliquis  after being given the okay by another provider. - Continue amiodarone  and Eliquis  - Schedule one month follow up to manage chronic conditions - Continue to monitor LFTs, PFTs, and TFTs with amiodarone  use

## 2023-08-17 DIAGNOSIS — Z87891 Personal history of nicotine dependence: Secondary | ICD-10-CM | POA: Diagnosis not present

## 2023-08-17 DIAGNOSIS — M4712 Other spondylosis with myelopathy, cervical region: Secondary | ICD-10-CM | POA: Diagnosis not present

## 2023-08-17 DIAGNOSIS — Z4789 Encounter for other orthopedic aftercare: Secondary | ICD-10-CM | POA: Diagnosis not present

## 2023-08-17 DIAGNOSIS — R2681 Unsteadiness on feet: Secondary | ICD-10-CM | POA: Diagnosis not present

## 2023-08-17 DIAGNOSIS — M6259 Muscle wasting and atrophy, not elsewhere classified, multiple sites: Secondary | ICD-10-CM | POA: Diagnosis not present

## 2023-08-17 DIAGNOSIS — J45909 Unspecified asthma, uncomplicated: Secondary | ICD-10-CM | POA: Diagnosis not present

## 2023-08-17 DIAGNOSIS — M51369 Other intervertebral disc degeneration, lumbar region without mention of lumbar back pain or lower extremity pain: Secondary | ICD-10-CM | POA: Diagnosis not present

## 2023-08-17 DIAGNOSIS — M62838 Other muscle spasm: Secondary | ICD-10-CM | POA: Diagnosis not present

## 2023-08-17 DIAGNOSIS — Z741 Need for assistance with personal care: Secondary | ICD-10-CM | POA: Diagnosis not present

## 2023-08-17 DIAGNOSIS — E789 Disorder of lipoprotein metabolism, unspecified: Secondary | ICD-10-CM | POA: Diagnosis not present

## 2023-08-17 DIAGNOSIS — I4891 Unspecified atrial fibrillation: Secondary | ICD-10-CM | POA: Diagnosis not present

## 2023-08-17 DIAGNOSIS — M4802 Spinal stenosis, cervical region: Secondary | ICD-10-CM | POA: Diagnosis not present

## 2023-08-17 DIAGNOSIS — K5909 Other constipation: Secondary | ICD-10-CM | POA: Diagnosis not present

## 2023-08-18 DIAGNOSIS — M4712 Other spondylosis with myelopathy, cervical region: Secondary | ICD-10-CM | POA: Diagnosis not present

## 2023-08-18 DIAGNOSIS — M51369 Other intervertebral disc degeneration, lumbar region without mention of lumbar back pain or lower extremity pain: Secondary | ICD-10-CM | POA: Diagnosis not present

## 2023-08-18 DIAGNOSIS — F419 Anxiety disorder, unspecified: Secondary | ICD-10-CM

## 2023-08-18 DIAGNOSIS — E789 Disorder of lipoprotein metabolism, unspecified: Secondary | ICD-10-CM | POA: Diagnosis not present

## 2023-08-18 DIAGNOSIS — Z741 Need for assistance with personal care: Secondary | ICD-10-CM

## 2023-08-18 DIAGNOSIS — M6259 Muscle wasting and atrophy, not elsewhere classified, multiple sites: Secondary | ICD-10-CM | POA: Diagnosis not present

## 2023-08-18 DIAGNOSIS — M62838 Other muscle spasm: Secondary | ICD-10-CM | POA: Diagnosis not present

## 2023-08-18 DIAGNOSIS — I4891 Unspecified atrial fibrillation: Secondary | ICD-10-CM | POA: Diagnosis not present

## 2023-08-18 DIAGNOSIS — Z4789 Encounter for other orthopedic aftercare: Secondary | ICD-10-CM | POA: Diagnosis not present

## 2023-08-18 DIAGNOSIS — K5909 Other constipation: Secondary | ICD-10-CM

## 2023-08-18 DIAGNOSIS — F32A Depression, unspecified: Secondary | ICD-10-CM

## 2023-08-18 DIAGNOSIS — M4802 Spinal stenosis, cervical region: Secondary | ICD-10-CM | POA: Diagnosis not present

## 2023-08-18 DIAGNOSIS — J45909 Unspecified asthma, uncomplicated: Secondary | ICD-10-CM | POA: Diagnosis not present

## 2023-08-18 DIAGNOSIS — R2681 Unsteadiness on feet: Secondary | ICD-10-CM | POA: Diagnosis not present

## 2023-08-18 DIAGNOSIS — Z87891 Personal history of nicotine dependence: Secondary | ICD-10-CM

## 2023-08-19 ENCOUNTER — Other Ambulatory Visit: Payer: Self-pay

## 2023-08-19 DIAGNOSIS — I4891 Unspecified atrial fibrillation: Secondary | ICD-10-CM | POA: Diagnosis not present

## 2023-08-19 DIAGNOSIS — J45909 Unspecified asthma, uncomplicated: Secondary | ICD-10-CM | POA: Diagnosis not present

## 2023-08-19 DIAGNOSIS — E789 Disorder of lipoprotein metabolism, unspecified: Secondary | ICD-10-CM | POA: Diagnosis not present

## 2023-08-19 DIAGNOSIS — R2681 Unsteadiness on feet: Secondary | ICD-10-CM | POA: Diagnosis not present

## 2023-08-19 DIAGNOSIS — M6259 Muscle wasting and atrophy, not elsewhere classified, multiple sites: Secondary | ICD-10-CM | POA: Diagnosis not present

## 2023-08-19 DIAGNOSIS — M62838 Other muscle spasm: Secondary | ICD-10-CM | POA: Diagnosis not present

## 2023-08-19 DIAGNOSIS — Z87891 Personal history of nicotine dependence: Secondary | ICD-10-CM | POA: Diagnosis not present

## 2023-08-19 DIAGNOSIS — Z4789 Encounter for other orthopedic aftercare: Secondary | ICD-10-CM | POA: Diagnosis not present

## 2023-08-19 DIAGNOSIS — G959 Disease of spinal cord, unspecified: Secondary | ICD-10-CM

## 2023-08-19 DIAGNOSIS — M47812 Spondylosis without myelopathy or radiculopathy, cervical region: Secondary | ICD-10-CM

## 2023-08-19 DIAGNOSIS — Z741 Need for assistance with personal care: Secondary | ICD-10-CM | POA: Diagnosis not present

## 2023-08-19 DIAGNOSIS — K5909 Other constipation: Secondary | ICD-10-CM | POA: Diagnosis not present

## 2023-08-19 DIAGNOSIS — M51369 Other intervertebral disc degeneration, lumbar region without mention of lumbar back pain or lower extremity pain: Secondary | ICD-10-CM | POA: Diagnosis not present

## 2023-08-19 DIAGNOSIS — M4712 Other spondylosis with myelopathy, cervical region: Secondary | ICD-10-CM | POA: Diagnosis not present

## 2023-08-19 DIAGNOSIS — M4802 Spinal stenosis, cervical region: Secondary | ICD-10-CM | POA: Diagnosis not present

## 2023-08-22 DIAGNOSIS — M4802 Spinal stenosis, cervical region: Secondary | ICD-10-CM | POA: Diagnosis not present

## 2023-08-23 ENCOUNTER — Ambulatory Visit: Admitting: Family Medicine

## 2023-08-23 ENCOUNTER — Encounter: Payer: 59 | Admitting: Family Medicine

## 2023-08-24 ENCOUNTER — Ambulatory Visit (INDEPENDENT_AMBULATORY_CARE_PROVIDER_SITE_OTHER): Admitting: Neurosurgery

## 2023-08-24 ENCOUNTER — Encounter: Payer: Self-pay | Admitting: Neurosurgery

## 2023-08-24 ENCOUNTER — Ambulatory Visit
Admission: RE | Admit: 2023-08-24 | Discharge: 2023-08-24 | Disposition: A | Discharge status: Home / Self Care | Source: Ambulatory Visit | Attending: Neurosurgery | Admitting: Neurosurgery

## 2023-08-24 ENCOUNTER — Ambulatory Visit
Admission: RE | Admit: 2023-08-24 | Discharge: 2023-08-24 | Disposition: A | Discharge status: Home / Self Care | Attending: Neurosurgery | Admitting: Neurosurgery

## 2023-08-24 ENCOUNTER — Telehealth: Payer: Self-pay | Admitting: Cardiovascular Disease

## 2023-08-24 ENCOUNTER — Ambulatory Visit: Payer: Self-pay

## 2023-08-24 VITALS — BP 172/90 | Temp 99.0°F | Ht 65.0 in | Wt 184.0 lb

## 2023-08-24 DIAGNOSIS — M47812 Spondylosis without myelopathy or radiculopathy, cervical region: Secondary | ICD-10-CM | POA: Insufficient documentation

## 2023-08-24 DIAGNOSIS — Z981 Arthrodesis status: Secondary | ICD-10-CM

## 2023-08-24 DIAGNOSIS — G959 Disease of spinal cord, unspecified: Secondary | ICD-10-CM

## 2023-08-24 DIAGNOSIS — Z4789 Encounter for other orthopedic aftercare: Secondary | ICD-10-CM | POA: Diagnosis not present

## 2023-08-24 DIAGNOSIS — M542 Cervicalgia: Secondary | ICD-10-CM | POA: Diagnosis not present

## 2023-08-24 NOTE — Telephone Encounter (Signed)
 FYI Only or Action Required?: FYI only for provider.  Patient was last seen in primary care on 08/16/2023 by Myrla Jon HERO, MD.  Called Nurse Triage reporting Cough, Hypertension, and Shortness of Breath.  Symptoms began several days ago.  Interventions attempted: OTC medications: Muccinex, Coricidin and Prescription medications: Breo Ellipta , albuterol  inhaler.  Symptoms are: mild SOB, intermittent wheezing (after coughing and at night), chest tightness after coughing, coughing spells cause gagging, productive cough with green to brown mucus, sinus pressure/congestion, hypertension (176/90) unchanged.  Triage Disposition: See Physician Within 24 Hours  Patient/caregiver understands and will follow disposition?: Yes             Copied from CRM #[id number]. Topic: Clinical - Red Word Triage >> Aug 24, 2023  1:46 PM Carla L wrote: Red Word that prompted transfer to Nurse Triage: sinus infection going into her chest, causing her SOB and her BP is high Reason for Disposition  [1] MILD asthma attack (e.g., no SOB at rest, mild SOB with walking, speaks normally in sentences, mild wheezing) AND [2] lasting > 24 hours on prescribed treatment  Answer Assessment - Initial Assessment Questions 1. RESPIRATORY STATUS: Describe your breathing? (e.g., wheezing, shortness of breath, unable to speak, severe coughing)      Shortness of breath and wheezing.  2. ONSET: When did this breathing problem begin?      Sunday.  3. PATTERN Does the difficult breathing come and go, or has it been constant since it started?      SOB constant, wheezing she states is mainly at nighttime.  4. SEVERITY: How bad is your breathing? (e.g., mild, moderate, severe)      Mild.  5. RECURRENT SYMPTOM: Have you had difficulty breathing before? If Yes, ask: When was the last time? and What happened that time?      Yes, 2 weeks ago she had the same symptoms and it cleared up (she states she took  Muccinex) and has now returned.  6. CARDIAC HISTORY: Do you have any history of heart disease? (e.g., heart attack, angina, bypass surgery, angioplasty)   Essential (primary) hypertension Senile purpura (HCC) PAH (pulmonary artery hypertension) (HCC) Mitral regurgitation NSTEMI (non-ST elevated myocardial infarction) (HCC) Atrial fibrillation with rapid ventricular response (HCC) Demand ischemia (HCC) Paroxysmal atrial fibrillation (HCC)   7. LUNG HISTORY: Do you have any history of lung disease?  (e.g., pulmonary embolus, asthma, emphysema)     Asthma. She states the wheezing is only when she starts coughing real bad, does not feel like an asthma attack.  8. CAUSE: What do you think is causing the breathing problem?      She thinks it is an infection that needs some antibiotics.  9. OTHER SYMPTOMS: Do you have any other symptoms? (e.g., chest pain, cough, dizziness, fever, runny nose)     Hypertension x 2-3 days(today was 176/90), sinus pressure/congestion, little bit chest tightness after coughing, productive cough with greenish-brown mucus (gags sometimes when trying to cough out mucus). Patient denies any fevers.  10. O2 SATURATION MONITOR:  Do you use an oxygen saturation monitor (pulse oximeter) at home? If Yes, ask: What is your reading (oxygen level) today? What is your usual oxygen saturation reading? (e.g., 95%)       Yes, 97%.  11. PREGNANCY: Is there any chance you are pregnant? When was your last menstrual period?       N/A.  12. TRAVEL: Have you traveled out of the country in the last month? (e.g., travel history, exposures)  No.  Protocols used: Breathing Difficulty-A-AH, Asthma Attack-A-AH

## 2023-08-24 NOTE — Telephone Encounter (Signed)
*  STAT* If patient is at the pharmacy, call can be transferred to refill team.   1. Which medications need to be refilled? (please list name of each medication and dose if known)   amiodarone  (PACERONE ) 200 MG tablet    4. Which pharmacy/location (including street and city if local pharmacy) is medication to be sent to?  [id number] [place], [place] - [address] AT [facility] Phone: [phone]  Fax: [phone]         5. Do they need a 30 day or 90 day supply? 90

## 2023-08-24 NOTE — Progress Notes (Signed)
   REFERRING PHYSICIAN:  Myrla Jon HERO, Md [address] [place],  [place] [address]  DOS: 07/16/23  ACDF C3-C6 with corpectomy C4-C5, posterior cervical fusion C3-C6  HISTORY OF PRESENT ILLNESS: 08/24/23 Whitney Stuart is a 73 year old presenting today for 6-week follow-up of the above surgery.  She has been doing well with minimal pain.  She presents today with multiple questions regarding her medications for her underlying comorbidities as she was changed to amiodarone  in the hospital.  Per her cardiology note on 08/09/2023.  He is to continue her on amiodarone  200 mg daily and have her follow-up with EP.  07/27/23 Whitney Stuart is status post above surgery. Given flexeril  and oxycodone  10mg  on discharge from the hospital. She was sent out on tapered dose of decadron  as well.   She was discharged to SNF on 07/23/23.   She is not having any significant pain. She still has numbness/tingling in her hands that is constant- the numbness/tingling in her arm is gone! She is working with PT and feels like she is stronger. She is ambulating with a walker. Has been walking down the halls.   She has not restarted ELIQUIS  yet. She is taking oxycodone  prn- usually takes one at night.   She is wearing her collar when up out of bed.    PHYSICAL EXAMINATION:  NEUROLOGICAL:  General: In no acute distress.   Awake, alert, oriented to person, place, and time.  Pupils equal round and reactive to light.  Facial tone is symmetric.    Strength: Side Biceps Triceps Deltoid Interossei Grip Wrist Ext. Wrist Flex.  R 5 5 5 5  4+ 5 5  L 4- 4- 5 5 4+ 4+ 4+   Incisions well healed.   Imaging:  08/24/23 cervical xrays Show stable hardware placement without evidence of complications  Assessment / Plan: Whitney Stuart is doing well s/p above surgery.  She is doing well from this with minimal pain and symptoms.  The majority of her questions and concerns.  I reiterated what is in her cardiology note  and recommended that she follow-up with cardiologist as well as her PCP for further clarification regarding her other medication. We will see her back in 6 weeks as schedule with cervical x-rays prior or sooner should she have any questions or concerns.  She was encouraged to call the office in the interim with any questions or concerns.  She expressed understanding and was in agreement with this plan.  Edsel Goods PA-C Dept of Neurosurgery

## 2023-08-25 ENCOUNTER — Ambulatory Visit
Admission: RE | Admit: 2023-08-25 | Discharge: 2023-08-25 | Disposition: A | Discharge status: Home / Self Care | Attending: Family Medicine | Admitting: Family Medicine

## 2023-08-25 ENCOUNTER — Encounter: Payer: Self-pay | Admitting: Family Medicine

## 2023-08-25 ENCOUNTER — Ambulatory Visit
Admission: RE | Admit: 2023-08-25 | Discharge: 2023-08-25 | Disposition: A | Discharge status: Home / Self Care | Source: Ambulatory Visit | Attending: Family Medicine | Admitting: Family Medicine

## 2023-08-25 ENCOUNTER — Ambulatory Visit (INDEPENDENT_AMBULATORY_CARE_PROVIDER_SITE_OTHER): Admitting: Family Medicine

## 2023-08-25 ENCOUNTER — Ambulatory Visit: Payer: Self-pay | Admitting: Family Medicine

## 2023-08-25 VITALS — BP 166/72 | HR 51 | Temp 98.8°F | Ht 65.0 in | Wt 185.0 lb

## 2023-08-25 DIAGNOSIS — R051 Acute cough: Secondary | ICD-10-CM | POA: Diagnosis not present

## 2023-08-25 DIAGNOSIS — I10 Essential (primary) hypertension: Secondary | ICD-10-CM

## 2023-08-25 DIAGNOSIS — R058 Other specified cough: Secondary | ICD-10-CM

## 2023-08-25 DIAGNOSIS — J4531 Mild persistent asthma with (acute) exacerbation: Secondary | ICD-10-CM | POA: Diagnosis not present

## 2023-08-25 DIAGNOSIS — E782 Mixed hyperlipidemia: Secondary | ICD-10-CM | POA: Diagnosis not present

## 2023-08-25 DIAGNOSIS — J90 Pleural effusion, not elsewhere classified: Secondary | ICD-10-CM

## 2023-08-25 DIAGNOSIS — Z981 Arthrodesis status: Secondary | ICD-10-CM | POA: Diagnosis not present

## 2023-08-25 DIAGNOSIS — R0989 Other specified symptoms and signs involving the circulatory and respiratory systems: Secondary | ICD-10-CM | POA: Diagnosis not present

## 2023-08-25 DIAGNOSIS — R059 Cough, unspecified: Secondary | ICD-10-CM | POA: Diagnosis not present

## 2023-08-25 MED ORDER — DOXYCYCLINE HYCLATE 100 MG PO TABS: 100.0000 mg | ORAL_TABLET | Freq: Two times a day (BID) | ORAL | 0 refills | Status: AC

## 2023-08-25 MED ORDER — LISINOPRIL 20 MG PO TABS: 20.0000 mg | ORAL_TABLET | Freq: Every day | ORAL | 1 refills | Status: DC

## 2023-08-25 MED ORDER — ROSUVASTATIN CALCIUM 5 MG PO TABS: 15.0000 mg | ORAL_TABLET | Freq: Every day | ORAL | 1 refills | Status: DC

## 2023-08-25 MED ORDER — PREDNISONE 20 MG PO TABS
40.0000 mg | ORAL_TABLET | Freq: Every day | ORAL | 0 refills | Status: AC
Start: 2023-08-25 — End: 2023-09-01

## 2023-08-25 MED ORDER — AMIODARONE HCL 200 MG PO TABS: 200.0000 mg | ORAL_TABLET | Freq: Every day | ORAL | Status: DC

## 2023-08-25 NOTE — Assessment & Plan Note (Signed)
 Currently not well controlled. Patient states that prior to her most recent surgery her blood pressure was controlled with both metoprolol  and lisinopril . Since surgery, she has not been on any medication. She restarted taking Lisinopril  20 mg daily the last few days after BP measurements in the 180s-190s with measurements of 160s today. Currently is also sick and taking albuterol  more frequently, contributing to elevated readings. - Continue Lisinopril  20 mg daily - Follow up in three weeks for management of chronic conditions

## 2023-08-25 NOTE — Progress Notes (Unsigned)
 Acute Office Visit  Subjective:     Patient ID: Whitney Stuart, female    DOB: December 26, 1950, 73 y.o.   MRN: [id number]  Chief Complaint  Patient presents with   Cough    Onset 2 wks, went away and started again on Sunday and got worse. Lots of chest congestion, mucus is green/ brown, chest tightness and short of breath- pt has asthma.    Medication Problem    Crestor  and lisinopril . Would like to discuss dose of crestor , states pt was taken off of lisinopril  when in hospital, states blood pressure has been high in 180s-190s so she resumed the meds to help with bp.     Whitney Stuart is a 73 yo F with a history of HTN, Afib, asthma, and recent cervical decompression who presents acutely to the clinic today for evaluation of cough and chest tightness.  On interview, she reports that her current episode started two weeks ago. She attributed her initial symptoms to allergies following her recent neck surgery. On Sunday, her symptoms started to worsen. She describes experiencing chest congestion, wheezing, cough, brown/yellow sputum production, shortness of breath, and chest tightness. She denies any fevers, chill, or sweats. She denies expelling any blood or any muscle aches. She reports that her appetite has been well given how she has been feeling. She also reports using her albuterol  inhaler more often to help with her wheezing. She expresses concern about her asthma being affected.  She also presents with concerns regarding her blood pressure. She reports home measurements in the 180-190s and restarted her lisinopril  to help manage. She is requesting further clarification of her hypertension management until her follow up in three weeks. In addition, she presents with confusion regarding what dose of rosuvastatin  she should be on given that three providers have told her three different things.   She reports no other concerns today.    ROS      Objective:    BP (!) 166/72 (BP Location:  Left Arm, Patient Position: Sitting, Cuff Size: Normal)   Pulse (!) 51   Temp 98.8 F (37.1 C) (Oral)   Ht 5' 5 (1.651 m)   Wt 185 lb (83.9 kg)   SpO2 97%   BMI 30.79 kg/m  {Vitals History (Optional):23777}  Physical Exam Vitals reviewed.  Constitutional:      General: She is not in acute distress.    Appearance: Normal appearance. She is not ill-appearing or diaphoretic.  HENT:     Head: Normocephalic.     Right Ear: Tympanic membrane, ear canal and external ear normal.     Left Ear: Tympanic membrane, ear canal and external ear normal.     Nose: Nose normal.     Mouth/Throat:     Mouth: Mucous membranes are moist.     Pharynx: Oropharynx is clear. No oropharyngeal exudate or posterior oropharyngeal erythema.  Eyes:     General: No scleral icterus.    Conjunctiva/sclera: Conjunctivae normal.     Pupils: Pupils are equal, round, and reactive to light.  Cardiovascular:     Rate and Rhythm: Normal rate and regular rhythm.     Pulses: Normal pulses.     Heart sounds: Normal heart sounds. No murmur heard.    No friction rub. No gallop.  Pulmonary:     Breath sounds: Rales (in the left lower lobe) present. No wheezing.  Abdominal:     General: There is no distension.     Palpations: Abdomen is soft.  Tenderness: There is no abdominal tenderness. There is no guarding.  Musculoskeletal:     Cervical back: Normal range of motion.     Right lower leg: No edema.     Left lower leg: No edema.  Lymphadenopathy:     Cervical: No cervical adenopathy.  Skin:    General: Skin is warm and dry.     Capillary Refill: Capillary refill takes less than 2 seconds.  Neurological:     Mental Status: She is alert and oriented to person, place, and time.  Psychiatric:        Mood and Affect: Mood normal.     No results found for any visits on 08/25/23.      Assessment & Plan:   Problem List Items Addressed This Visit       Cardiovascular and Mediastinum   Essential (primary)  hypertension   Currently not well controlled. Patient states that prior to her most recent surgery her blood pressure was controlled with both metoprolol  and lisinopril . Since surgery, she has not been on any medication. She restarted taking Lisinopril  20 mg daily the last few days after BP measurements in the 180s-190s with measurements of 160s today. Currently is also sick and taking albuterol  more frequently, contributing to elevated readings. - Continue Lisinopril  20 mg daily - Follow up in three weeks for management of chronic conditions      Relevant Medications   rosuvastatin  (CRESTOR ) 5 MG tablet   lisinopril  (ZESTRIL ) 20 MG tablet     Respiratory   Asthma, chronic - Primary   Currently managed with medications. Presents with asthma exacerbation likely due to underlying pneumonia. Reports using albuterol  more frequently to help with wheezing, chest tightness, and breathing patterns. Concern for late phase response. - Start Prednisone  40 mg daily for 7 days      Relevant Medications   predniSONE  (DELTASONE ) 20 MG tablet     Other   Hyperlipidemia, unspecified   Currently controlled on medication. Had questions today about what dose she should be on because of three different providers telling her different dosages. Most recently on 15 mg daily.  - Continue rosuvastatin  15 mg daily  - Follow up in three weeks for management of chronic conditions      Relevant Medications   rosuvastatin  (CRESTOR ) 5 MG tablet   lisinopril  (ZESTRIL ) 20 MG tablet   Other Visit Diagnoses       Acute cough       Relevant Orders   DG Chest 2 View (Completed)   CBC w/Diff/Platelet   Basic Metabolic Panel (BMET)     Sputum production       Relevant Orders   DG Chest 2 View (Completed)   CBC w/Diff/Platelet   Basic Metabolic Panel (BMET)      Acute Cough Sputum Production Reports a 2 week history of chest congestion, wheezing, cough, brown/yellow mucus production, shortness of breath, and  chest tightness. Denies any fevers, chills, sweats, sore throat, or myalgias. Reports appetite has been well. Denies any sick contacts. Differential includes flu, covid, strep throat, and pneumonia. Rapid flu, covid, and strep throat resulted negative. - Order rapid flu, covid, and strep throat - Order chest X Ray - Order CBC and BMP - Start Doxycycline  100 mg bid for a week  Meds ordered this encounter  Medications   rosuvastatin  (CRESTOR ) 5 MG tablet    Sig: Take 3 tablets (15 mg total) by mouth daily.    Dispense:  270 tablet  Refill:  1   lisinopril  (ZESTRIL ) 20 MG tablet    Sig: Take 1 tablet (20 mg total) by mouth daily.    Dispense:  90 tablet    Refill:  1   doxycycline  (VIBRA -TABS) 100 MG tablet    Sig: Take 1 tablet (100 mg total) by mouth 2 (two) times daily for 7 days.    Dispense:  14 tablet    Refill:  0   predniSONE  (DELTASONE ) 20 MG tablet    Sig: Take 2 tablets (40 mg total) by mouth daily with breakfast for 7 days.    Dispense:  14 tablet    Refill:  0    Return if symptoms worsen or fail to improve.  Elia LULLA Blanch, Medical Student

## 2023-08-25 NOTE — Assessment & Plan Note (Signed)
 Currently controlled on medication. Had questions today about what dose she should be on because of three different providers telling her different dosages. Most recently on 15 mg daily.  - Continue rosuvastatin  15 mg daily  - Follow up in three weeks for management of chronic conditions

## 2023-08-25 NOTE — Assessment & Plan Note (Signed)
 Currently managed with medications. Presents with asthma exacerbation likely due to underlying pneumonia. Reports using albuterol  more frequently to help with wheezing, chest tightness, and breathing patterns. Concern for late phase response. - Start Prednisone  40 mg daily for 7 days

## 2023-08-25 NOTE — Telephone Encounter (Signed)
 Noted

## 2023-08-26 DIAGNOSIS — M4712 Other spondylosis with myelopathy, cervical region: Secondary | ICD-10-CM | POA: Diagnosis not present

## 2023-08-26 DIAGNOSIS — J45909 Unspecified asthma, uncomplicated: Secondary | ICD-10-CM | POA: Diagnosis not present

## 2023-08-26 DIAGNOSIS — M4802 Spinal stenosis, cervical region: Secondary | ICD-10-CM | POA: Diagnosis not present

## 2023-08-26 DIAGNOSIS — M6259 Muscle wasting and atrophy, not elsewhere classified, multiple sites: Secondary | ICD-10-CM | POA: Diagnosis not present

## 2023-08-26 DIAGNOSIS — R2681 Unsteadiness on feet: Secondary | ICD-10-CM | POA: Diagnosis not present

## 2023-08-26 DIAGNOSIS — Z4789 Encounter for other orthopedic aftercare: Secondary | ICD-10-CM | POA: Diagnosis not present

## 2023-08-26 DIAGNOSIS — Z741 Need for assistance with personal care: Secondary | ICD-10-CM | POA: Diagnosis not present

## 2023-08-26 DIAGNOSIS — M51369 Other intervertebral disc degeneration, lumbar region without mention of lumbar back pain or lower extremity pain: Secondary | ICD-10-CM | POA: Diagnosis not present

## 2023-08-26 DIAGNOSIS — E789 Disorder of lipoprotein metabolism, unspecified: Secondary | ICD-10-CM | POA: Diagnosis not present

## 2023-08-26 DIAGNOSIS — I4891 Unspecified atrial fibrillation: Secondary | ICD-10-CM | POA: Diagnosis not present

## 2023-08-26 DIAGNOSIS — K5909 Other constipation: Secondary | ICD-10-CM | POA: Diagnosis not present

## 2023-08-26 DIAGNOSIS — Z87891 Personal history of nicotine dependence: Secondary | ICD-10-CM | POA: Diagnosis not present

## 2023-08-26 DIAGNOSIS — M62838 Other muscle spasm: Secondary | ICD-10-CM | POA: Diagnosis not present

## 2023-08-26 LAB — CBC WITH DIFFERENTIAL/PLATELET
Basophils Absolute: 0.1 x10E3/uL (ref 0.0–0.2)
Basos: 1 %
EOS (ABSOLUTE): 0.2 x10E3/uL (ref 0.0–0.4)
Eos: 2 %
Hematocrit: 31.8 % — ABNORMAL LOW (ref 34.0–46.6)
Hemoglobin: 10.1 g/dL — ABNORMAL LOW (ref 11.1–15.9)
Immature Grans (Abs): 0 x10E3/uL (ref 0.0–0.1)
Immature Granulocytes: 0 %
Lymphocytes Absolute: 2.2 x10E3/uL (ref 0.7–3.1)
Lymphs: 23 %
MCH: 30.8 pg (ref 26.6–33.0)
MCHC: 31.8 g/dL (ref 31.5–35.7)
MCV: 97 fL (ref 79–97)
Monocytes Absolute: 0.8 x10E3/uL (ref 0.1–0.9)
Monocytes: 8 %
Neutrophils Absolute: 6.3 x10E3/uL (ref 1.4–7.0)
Neutrophils: 66 %
Platelets: 494 x10E3/uL — ABNORMAL HIGH (ref 150–450)
RBC: 3.28 x10E6/uL — ABNORMAL LOW (ref 3.77–5.28)
RDW: 12.5 % (ref 11.7–15.4)
WBC: 9.6 x10E3/uL (ref 3.4–10.8)

## 2023-08-26 LAB — BASIC METABOLIC PANEL WITH GFR
BUN/Creatinine Ratio: 11 — ABNORMAL LOW (ref 12–28)
BUN: 12 mg/dL (ref 8–27)
CO2: 19 mmol/L — ABNORMAL LOW (ref 20–29)
Calcium: 9.2 mg/dL (ref 8.7–10.3)
Chloride: 107 mmol/L — ABNORMAL HIGH (ref 96–106)
Creatinine, Ser: 1.13 mg/dL — ABNORMAL HIGH (ref 0.57–1.00)
Glucose: 85 mg/dL (ref 70–99)
Potassium: 4.5 mmol/L (ref 3.5–5.2)
Sodium: 142 mmol/L (ref 134–144)
eGFR: 52 mL/min/1.73 — ABNORMAL LOW (ref >59)

## 2023-08-27 ENCOUNTER — Other Ambulatory Visit: Payer: Self-pay | Admitting: Family Medicine

## 2023-08-27 DIAGNOSIS — J4520 Mild intermittent asthma, uncomplicated: Secondary | ICD-10-CM

## 2023-08-31 ENCOUNTER — Other Ambulatory Visit: Payer: Self-pay | Admitting: Family Medicine

## 2023-08-31 ENCOUNTER — Encounter: Payer: Self-pay | Admitting: Student in an Organized Health Care Education/Training Program

## 2023-08-31 ENCOUNTER — Ambulatory Visit
Admission: RE | Admit: 2023-08-31 | Discharge: 2023-08-31 | Disposition: A | Discharge status: Home / Self Care | Source: Ambulatory Visit | Attending: Student in an Organized Health Care Education/Training Program | Admitting: Student in an Organized Health Care Education/Training Program

## 2023-08-31 ENCOUNTER — Ambulatory Visit
Admission: RE | Admit: 2023-08-31 | Discharge: 2023-08-31 | Disposition: A | Discharge status: Home / Self Care | Source: Ambulatory Visit | Attending: Student in an Organized Health Care Education/Training Program

## 2023-08-31 ENCOUNTER — Ambulatory Visit (INDEPENDENT_AMBULATORY_CARE_PROVIDER_SITE_OTHER): Admitting: Student in an Organized Health Care Education/Training Program

## 2023-08-31 VITALS — BP 170/88 | HR 58 | Temp 97.1°F | Ht 65.0 in | Wt 179.2 lb

## 2023-08-31 DIAGNOSIS — J90 Pleural effusion, not elsewhere classified: Secondary | ICD-10-CM

## 2023-08-31 DIAGNOSIS — J4531 Mild persistent asthma with (acute) exacerbation: Secondary | ICD-10-CM | POA: Diagnosis not present

## 2023-08-31 DIAGNOSIS — J4520 Mild intermittent asthma, uncomplicated: Secondary | ICD-10-CM

## 2023-08-31 NOTE — Progress Notes (Signed)
 Synopsis: Referred in for pleural effusion by Myrla Jon HERO, MD  Assessment & Plan:   #Pleural effusion (Primary) #Asthma  She presents for the evaluation of a right-sided pleural effusion which was noted on chest x-ray performed given recent upper respiratory tract infection and asthma exacerbation.  Her symptoms have significantly improved with a course of antibiotics as well as with steroids.  She is maintained on Breo Ellipta  and her asthma is fully controlled without any wheeze or adventitious lung sounds on auscultation.  Today, I performed point-of-care ultrasound at the bedside and evaluated both pleural spaces.  The right pleural space does not show any pleural effusions with no signs of consolidation.  There was an A-line pattern with pleural sliding and no effusion.  Liver and kidneys were also visualized with ultrasound.  This is overall reassuring or resolution.  I am reassured given patient's significant symptomatic improvement and absence of the pleural effusion on bedside ultrasound.  I will obtain a chest x-ray today to document this improvement, and consider getting a CT scan of the chest with IV contrast should there be any abnormal findings on chest x-ray.  I suspect this was a reactive effusion secondary to her infectious process.  Also the differential is heart failure effusion in the setting of her atrial fibrillation and lower extremity edema.  - DG Chest 2 View; Future - Continue Breo Ellipta , asthma management per PCP   Return if symptoms worsen or fail to improve.  I spent 60 minutes caring for this patient today, including preparing to see the patient, obtaining a medical history , reviewing a separately obtained history, performing a medically appropriate examination and/or evaluation, counseling and educating the patient/family/caregiver, ordering medications, tests, or procedures, documenting clinical information in the electronic health record, and  independently interpreting results (not separately reported/billed) and communicating results to the patient/family/caregiver  [name], MD [facility]   End of visit medications:  No orders of the defined types were placed in this encounter.    Current Outpatient Medications:    acetaminophen  (TYLENOL ) 325 MG tablet, Take 2 tablets (650 mg total) by mouth every 6 (six) hours as needed for mild pain (pain score 1-3), fever or headache (or Fever >/= 101)., Disp: , Rfl:    albuterol  (VENTOLIN  HFA) 108 (90 Base) MCG/ACT inhaler, TAKE 2 PUFFS BY MOUTH EVERY 6 HOURS AS NEEDED FOR WHEEZE OR SHORTNESS OF BREATH, Disp: 8.5 each, Rfl: 2   amiodarone  (PACERONE ) 200 MG tablet, Take 1 tablet (200 mg total) by mouth at bedtime., Disp: 90 tablet, Rfl:    apixaban  (ELIQUIS ) 5 MG TABS tablet, Take 1 tablet (5 mg total) by mouth 2 (two) times daily., Disp: , Rfl:    BREO ELLIPTA  200-25 MCG/ACT AEPB, TAKE 1 PUFF BY MOUTH EVERY DAY, Disp: 60 each, Rfl: 2   Chlorphen-PE-Acetaminophen  (CORICIDIN D COLD/FLU/SINUS PO), Take by mouth every 4 (four) hours as needed (every 4 hours as needed for cough)., Disp: , Rfl:    clonazePAM  (KLONOPIN ) 0.5 MG tablet, Take 1 tablet (0.5 mg total) by mouth 2 (two) times daily as needed (anxiety)., Disp: 10 tablet, Rfl: 0   cyclobenzaprine  (FLEXERIL ) 5 MG tablet, Take 1 tablet (5 mg total) by mouth 3 (three) times daily as needed for muscle spasms., Disp: , Rfl:    doxycycline  (VIBRA -TABS) 100 MG tablet, Take 1 tablet (100 mg total) by mouth 2 (two) times daily for 7 days., Disp: 14 tablet, Rfl: 0   lisinopril  (ZESTRIL ) 20 MG  tablet, Take 1 tablet (20 mg total) by mouth daily., Disp: 90 tablet, Rfl: 1   nitroGLYCERIN  (NITROSTAT ) 0.4 MG SL tablet, Place 1 tablet (0.4 mg total) under the tongue every 5 (five) minutes as needed for chest pain., Disp: 30 tablet, Rfl: 3   predniSONE  (DELTASONE ) 20 MG tablet, Take 2 tablets (40 mg total) by mouth daily with  breakfast for 7 days., Disp: 14 tablet, Rfl: 0   rosuvastatin  (CRESTOR ) 5 MG tablet, Take 3 tablets (15 mg total) by mouth daily., Disp: 270 tablet, Rfl: 1   sertraline  (ZOLOFT ) 100 MG tablet, Take 1 tablet (100 mg total) by mouth daily., Disp: 90 tablet, Rfl: 1   Subjective:   PATIENT ID: Whitney Stuart GENDER: female DOB: 05/10/1950, MRN: [id number]  Chief Complaint  Patient presents with   Consult    No wheezing. DOE. Cough with off white sputum.    HPI  Patient is a pleasant 73 year old female with a past medical history of atrial fibrillation on Eliquis  and asthma on Breo Ellipta  presenting to clinic for the evaluation of a pleural effusion.  Patient reports symptom onset little over a week ago with shortness of breath, cough productive of green sputum, and a wheeze.  She has a history of asthma and is maintained on Breo.  She was exposed to a relative who had returned from a cruise with similar symptoms.  She was seen by her primary care physician and given a course of antibiotics with doxycycline  as well as a course of steroids with prednisone .  Chest x-ray was performed showing a right-sided pleural effusion for which the patient is referred to us .  With antibiotics and steroids, her symptoms are significantly improved.  She reports improvement in the shortness of breath and minimal cough that persists.  The cough is no longer productive of greenish sputum and at times brings up clear phlegm.  There have been no fevers, no chills, no chest pain, no chest tightness.  Medical record reviewed and noted for recent admissions for atrial fibrillation with a rapid ventricular response.  She underwent left heart cath which did not show any coronary artery disease.  She also had cervical myelopathy following a fall requiring neurosurgery.  She is maintained on amiodarone  as well as apixaban .  She reports recent lower extremity edema which was managed conservatively with improvement.  Patient  reports a distant history of smoking for a couple of years.  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  Denies any occupational exposures.  Ancillary information including prior medications, full medical/surgical/family/social histories, and PFTs (when available) are listed below and have been reviewed.   Review of Systems  Constitutional:  Negative for chills, fever, malaise/fatigue and weight loss.  Respiratory:  Positive for cough (Significantly improved). Negative for hemoptysis, sputum production, shortness of breath and wheezing.   Cardiovascular:  Negative for chest pain and palpitations.     Objective:   Vitals:   08/31/23 1306  BP: (!) 170/88  Pulse: (!) 58  Temp: (!) 97.1 F (36.2 C)  SpO2: 95%  Weight: 179 lb 3.2 oz (81.3 kg)  Height: 5' 5 (1.651 m)   95% on RA  BMI Readings from Last 3 Encounters:  08/31/23 29.82 kg/m  08/25/23 30.79 kg/m  08/24/23 30.62 kg/m   Wt Readings from Last 3 Encounters:  08/31/23 179 lb 3.2 oz (81.3 kg)  08/25/23 185 lb (83.9 kg)  08/24/23 184 lb (83.5 kg)    Physical Exam Constitutional:  Appearance: Normal appearance.  Cardiovascular:     Rate and Rhythm: Normal rate and regular rhythm.     Pulses: Normal pulses.     Heart sounds: Normal heart sounds.  Pulmonary:     Effort: Pulmonary effort is normal. No respiratory distress.     Breath sounds: Normal breath sounds. No wheezing or rales.  Neurological:     General: No focal deficit present.     Mental Status: She is alert and oriented to person, place, and time. Mental status is at baseline.    Chest Point of Care Ultrasound  Patient name: TRIANNA LUPIEN MRN: [id number] DOB: [birth year]  Indication: Pleural Effusion  We performed ultrasound of Right Posterolateral hemithorax,for assessment of presence, volume and sonographic appearance of pleural fluid and sonographic apperance of lung in preparation for sampling of pleural fluid/insertion of catheter into  pleural space.  Relevant sonographic findings:  No amount of pleural fluid noted on isonation. Visualized lung parenchyma was noted to be moving freely with respiration.  Impression:  No pleural effusion.  No further sonographic surveillance needed.   Ancillary Information    Past Medical History:  Diagnosis Date   Arthritis    Asthma    Asthma    Chest pain    a. 11/2019 Cor CTA: Calcium  score equals 0.  Normal coronary arteries.   CKD (chronic kidney disease)    Diastolic dysfunction    a. 11/2019 Echo: EF 60-65%.  Grade 1 diastolic dysfunction; b. 12/2021 Echo: EF 60-65%, GrI DD; c. 05/2022 Echo: EF 60-65%, no rwma. Nl RV fxn. RVSP 43.95mmHg. Sev dil LA. Mod MR.   GERD (gastroesophageal reflux disease)    History of cervical cancer    Hyperlipidemia    Hypertension    Mitral regurgitation    a. 12/22023 Echo: Mild-mod MR; b. 05/2022 Echo: Mod MR.   PAH (pulmonary artery hypertension) (HCC)    a. 11/2019 Echo: RVSP ; b .12/2021 Echo: RVSP 39.69mmHg; c. 05/2022 Echo: RVSP 43.63mmHg.   Persistent atrial fibrillation (HCC)    a. Dx 04/2022-->Eliquis  (CHA2DS2VASc = 3); b. 06/2022 Zio: predominantly sinus rhythm at a rate of 66 bpm (range 44-154), 2 brief runs of SVT (fastest 154 bpm x 20 beats) and otherwise rare PACs and PVCs.   Sleep-disordered breathing      Family History  Problem Relation Age of Onset   Heart disease Mother    Stroke Mother    Congestive Heart Failure Mother    Hypertension Mother    Hypertension Father    Kidney disease Father    Heart murmur Sister    Hypertension Sister    Ovarian cancer Sister        ovarian   Hypertension Sister    Hypertension Sister    Lung cancer Brother    Hypertension Brother    Hypertension Brother    Sleep apnea Brother    Hypertension Brother    Heart disease Brother    Hypertension Brother    Hypertension Brother    Hypertension Brother    Hypertension Brother    Hypertension Brother    Mental illness  Maternal Aunt    Congestive Heart Failure Daughter    Colon cancer Neg Hx    Breast cancer Neg Hx      Past Surgical History:  Procedure Laterality Date   ABDOMINAL HYSTERECTOMY  1999   total   ANTERIOR CERVICAL CORPECTOMY N/A 07/16/2023   Procedure: C4-5 Anterior Cervical Corpectomy, arthrodesis and C3-6 Anterior instrumentation;  Surgeon: [name] ORN, MD;  Location: [facility] OR[facility];  Service: Neurosurgery;  Laterality: N/A;   HAND SURGERY Bilateral 2003   carpal tunnel   LEFT HEART CATH AND CORONARY ANGIOGRAPHY N/A 06/30/2023   Procedure: LEFT HEART CATH AND CORONARY ANGIOGRAPHY;  Surgeon: Darron Deatrice LABOR, MD;  Location: [facility];  Service: Cardiovascular;  Laterality: N/A;   LIPOMA EXCISION     POSTERIOR CERVICAL FUSION/FORAMINOTOMY N/A 07/16/2023   Procedure: C3-C6 Posterior Cervical  Fusion;  Surgeon: [name] ORN, MD;  Location: [facility] OR[facility];  Service: Neurosurgery;  Laterality: N/A;    Social History   Socioeconomic History   Marital status: Widowed    Spouse name: Marwah Disbro   Number of children: 7   Years of education: Not on file   Highest education level: GED or equivalent  Occupational History   Occupation: Retired  Tobacco Use   Smoking status: Former    Current packs/day: 0.00    Average packs/day: 0.5 packs/day for 6.0 years (3.0 ttl pk-yrs)    Types: Cigarettes    Start date: 01/25/1991    Quit date: 01/24/1997    Years since quitting: 26.6   Smokeless tobacco: Never  Vaping Use   Vaping status: Never Used  Substance and Sexual Activity   Alcohol use: No   Drug use: No   Sexual activity: Yes    Birth control/protection: Surgical  Other Topics Concern   Not on file  Social History Narrative   Not on file   Social Drivers of Health   Financial Resource Strain: Low Risk  (12/28/2022)   Overall Financial Resource Strain (CARDIA)    Difficulty of Paying Living Expenses: Not hard at all  Food Insecurity: No Food Insecurity (07/13/2023)    Hunger Vital Sign    Worried About Running Out of Food in the Last Year: Never true    Ran Out of Food in the Last Year: Never true  Transportation Needs: No Transportation Needs (07/13/2023)   PRAPARE - Administrator, Civil Service (Medical): No    Lack of Transportation (Non-Medical): No  Physical Activity: Insufficiently Active (12/28/2022)   Exercise Vital Sign    Days of Exercise per Week: 3 days    Minutes of Exercise per Session: 30 min  Stress: No Stress Concern Present (12/28/2022)   [employer] of Occupational Health - Occupational Stress Questionnaire    Feeling of Stress : Not at all  Social Connections: Moderately Isolated (07/13/2023)   Social Connection and Isolation Panel    Frequency of Communication with Friends and Family: More than three times a week    Frequency of Social Gatherings with Friends and Family: More than three times a week    Attends Religious Services: More than 4 times per year    Active Member of [employer] or Organizations: No    Attends Banker Meetings: Never    Marital Status: Widowed  Intimate Partner Violence: Not At Risk (07/13/2023)   Humiliation, Afraid, Rape, and Kick questionnaire    Fear of Current or Ex-Partner: No    Emotionally Abused: No    Physically Abused: No    Sexually Abused: No     Allergies  Allergen Reactions   Methylprednisolone  Palpitations     CBC    Component Value Date/Time   WBC 9.6 08/25/2023 1512   WBC 14.8 (H) 07/23/2023 0523   RBC 3.28 (L) 08/25/2023 1512   RBC 3.07 (L) 07/23/2023 0523   HGB  10.1 (L) 08/25/2023 1512   HCT 31.8 (L) 08/25/2023 1512   PLT 494 (H) 08/25/2023 1512   MCV 97 08/25/2023 1512   MCH 30.8 08/25/2023 1512   MCH 31.3 07/23/2023 0523   MCHC 31.8 08/25/2023 1512   MCHC 32.2 07/23/2023 0523   RDW 12.5 08/25/2023 1512   LYMPHSABS 2.2 08/25/2023 1512   MONOABS 0.6 06/29/2023 0723   EOSABS 0.2 08/25/2023 1512   BASOSABS 0.1 08/25/2023 1512    Pulmonary  Functions Testing Results:     No data to display          Outpatient Medications Prior to Visit  Medication Sig Dispense Refill   acetaminophen  (TYLENOL ) 325 MG tablet Take 2 tablets (650 mg total) by mouth every 6 (six) hours as needed for mild pain (pain score 1-3), fever or headache (or Fever >/= 101).     albuterol  (VENTOLIN  HFA) 108 (90 Base) MCG/ACT inhaler TAKE 2 PUFFS BY MOUTH EVERY 6 HOURS AS NEEDED FOR WHEEZE OR SHORTNESS OF BREATH 8.5 each 2   amiodarone  (PACERONE ) 200 MG tablet Take 1 tablet (200 mg total) by mouth at bedtime. 90 tablet    apixaban  (ELIQUIS ) 5 MG TABS tablet Take 1 tablet (5 mg total) by mouth 2 (two) times daily.     BREO ELLIPTA  200-25 MCG/ACT AEPB TAKE 1 PUFF BY MOUTH EVERY DAY 60 each 2   Chlorphen-PE-Acetaminophen  (CORICIDIN D COLD/FLU/SINUS PO) Take by mouth every 4 (four) hours as needed (every 4 hours as needed for cough).     clonazePAM  (KLONOPIN ) 0.5 MG tablet Take 1 tablet (0.5 mg total) by mouth 2 (two) times daily as needed (anxiety). 10 tablet 0   cyclobenzaprine  (FLEXERIL ) 5 MG tablet Take 1 tablet (5 mg total) by mouth 3 (three) times daily as needed for muscle spasms.     doxycycline  (VIBRA -TABS) 100 MG tablet Take 1 tablet (100 mg total) by mouth 2 (two) times daily for 7 days. 14 tablet 0   lisinopril  (ZESTRIL ) 20 MG tablet Take 1 tablet (20 mg total) by mouth daily. 90 tablet 1   nitroGLYCERIN  (NITROSTAT ) 0.4 MG SL tablet Place 1 tablet (0.4 mg total) under the tongue every 5 (five) minutes as needed for chest pain. 30 tablet 3   predniSONE  (DELTASONE ) 20 MG tablet Take 2 tablets (40 mg total) by mouth daily with breakfast for 7 days. 14 tablet 0   rosuvastatin  (CRESTOR ) 5 MG tablet Take 3 tablets (15 mg total) by mouth daily. 270 tablet 1   sertraline  (ZOLOFT ) 100 MG tablet Take 1 tablet (100 mg total) by mouth daily. 90 tablet 1   No facility-administered medications prior to visit.

## 2023-09-01 DIAGNOSIS — Z87891 Personal history of nicotine dependence: Secondary | ICD-10-CM | POA: Diagnosis not present

## 2023-09-01 DIAGNOSIS — Z741 Need for assistance with personal care: Secondary | ICD-10-CM | POA: Diagnosis not present

## 2023-09-01 DIAGNOSIS — M4802 Spinal stenosis, cervical region: Secondary | ICD-10-CM | POA: Diagnosis not present

## 2023-09-01 DIAGNOSIS — M62838 Other muscle spasm: Secondary | ICD-10-CM | POA: Diagnosis not present

## 2023-09-01 DIAGNOSIS — M6259 Muscle wasting and atrophy, not elsewhere classified, multiple sites: Secondary | ICD-10-CM | POA: Diagnosis not present

## 2023-09-01 DIAGNOSIS — J45909 Unspecified asthma, uncomplicated: Secondary | ICD-10-CM | POA: Diagnosis not present

## 2023-09-01 DIAGNOSIS — K5909 Other constipation: Secondary | ICD-10-CM | POA: Diagnosis not present

## 2023-09-01 DIAGNOSIS — E789 Disorder of lipoprotein metabolism, unspecified: Secondary | ICD-10-CM | POA: Diagnosis not present

## 2023-09-01 DIAGNOSIS — R2681 Unsteadiness on feet: Secondary | ICD-10-CM | POA: Diagnosis not present

## 2023-09-01 DIAGNOSIS — M4712 Other spondylosis with myelopathy, cervical region: Secondary | ICD-10-CM | POA: Diagnosis not present

## 2023-09-01 DIAGNOSIS — I4891 Unspecified atrial fibrillation: Secondary | ICD-10-CM | POA: Diagnosis not present

## 2023-09-01 DIAGNOSIS — Z4789 Encounter for other orthopedic aftercare: Secondary | ICD-10-CM | POA: Diagnosis not present

## 2023-09-01 DIAGNOSIS — M51369 Other intervertebral disc degeneration, lumbar region without mention of lumbar back pain or lower extremity pain: Secondary | ICD-10-CM | POA: Diagnosis not present

## 2023-09-02 ENCOUNTER — Ambulatory Visit: Payer: Self-pay | Admitting: Neurosurgery

## 2023-09-02 DIAGNOSIS — I4891 Unspecified atrial fibrillation: Secondary | ICD-10-CM | POA: Diagnosis not present

## 2023-09-07 ENCOUNTER — Other Ambulatory Visit: Payer: Self-pay

## 2023-09-07 ENCOUNTER — Other Ambulatory Visit (HOSPITAL_COMMUNITY): Payer: Self-pay

## 2023-09-07 ENCOUNTER — Telehealth: Payer: Self-pay | Admitting: Family Medicine

## 2023-09-07 ENCOUNTER — Telehealth: Payer: Self-pay

## 2023-09-07 DIAGNOSIS — I4891 Unspecified atrial fibrillation: Secondary | ICD-10-CM | POA: Diagnosis not present

## 2023-09-07 DIAGNOSIS — R2681 Unsteadiness on feet: Secondary | ICD-10-CM | POA: Diagnosis not present

## 2023-09-07 DIAGNOSIS — J4520 Mild intermittent asthma, uncomplicated: Secondary | ICD-10-CM

## 2023-09-07 DIAGNOSIS — K5909 Other constipation: Secondary | ICD-10-CM | POA: Diagnosis not present

## 2023-09-07 DIAGNOSIS — M51369 Other intervertebral disc degeneration, lumbar region without mention of lumbar back pain or lower extremity pain: Secondary | ICD-10-CM | POA: Diagnosis not present

## 2023-09-07 DIAGNOSIS — J45909 Unspecified asthma, uncomplicated: Secondary | ICD-10-CM | POA: Diagnosis not present

## 2023-09-07 DIAGNOSIS — M62838 Other muscle spasm: Secondary | ICD-10-CM | POA: Diagnosis not present

## 2023-09-07 DIAGNOSIS — M4712 Other spondylosis with myelopathy, cervical region: Secondary | ICD-10-CM | POA: Diagnosis not present

## 2023-09-07 DIAGNOSIS — Z741 Need for assistance with personal care: Secondary | ICD-10-CM | POA: Diagnosis not present

## 2023-09-07 DIAGNOSIS — M6259 Muscle wasting and atrophy, not elsewhere classified, multiple sites: Secondary | ICD-10-CM | POA: Diagnosis not present

## 2023-09-07 DIAGNOSIS — M4802 Spinal stenosis, cervical region: Secondary | ICD-10-CM | POA: Diagnosis not present

## 2023-09-07 DIAGNOSIS — E789 Disorder of lipoprotein metabolism, unspecified: Secondary | ICD-10-CM | POA: Diagnosis not present

## 2023-09-07 DIAGNOSIS — Z87891 Personal history of nicotine dependence: Secondary | ICD-10-CM | POA: Diagnosis not present

## 2023-09-07 DIAGNOSIS — Z4789 Encounter for other orthopedic aftercare: Secondary | ICD-10-CM | POA: Diagnosis not present

## 2023-09-07 MED ORDER — CLONAZEPAM 0.5 MG PO TABS: 0.5000 mg | ORAL_TABLET | Freq: Two times a day (BID) | ORAL | 0 refills | Status: DC | PRN

## 2023-09-07 NOTE — Telephone Encounter (Signed)
 Converted into refill request.

## 2023-09-07 NOTE — Telephone Encounter (Signed)
 Pharmacy Patient Advocate Encounter   Received notification from CoverMyMeds that prior authorization for Rosuvastatin  5mg  (3 tablets daily) is required/requested.   Insurance verification completed.   The patient is insured through [place] .   Per test claim:  One tablet daily is preferred by the insurance.  If suggested medication is appropriate, Please send in a new RX and discontinue this one. If not, please advise as to why it's not appropriate so that we may request a Prior Authorization. Please note, some preferred medications may still require a PA.  If the suggested medications have not been trialed and there are no contraindications to their use, the PA will not be submitted, as it will not be approved.

## 2023-09-07 NOTE — Telephone Encounter (Signed)
 [facility] faxed refill request for the following medications:   clonazePAM  (KLONOPIN ) 0.5 MG tablet  amiodarone  (PACERONE ) 200 MG tablet   albuterol  (VENTOLIN  HFA) 108 (90 Base) MCG/ACT inhale    Please advise

## 2023-09-07 NOTE — Telephone Encounter (Signed)
 [facility] faxed refill request for the following medications:   BREO ELLIPTA  200-25 MCG/ACT AEPB    nitroGLYCERIN  (NITROSTAT ) 0.4 MG SL tablet     Please advise.

## 2023-09-08 ENCOUNTER — Telehealth: Payer: Self-pay | Admitting: Family Medicine

## 2023-09-08 ENCOUNTER — Other Ambulatory Visit: Payer: Self-pay | Admitting: Family Medicine

## 2023-09-08 ENCOUNTER — Other Ambulatory Visit: Payer: Self-pay

## 2023-09-08 DIAGNOSIS — E789 Disorder of lipoprotein metabolism, unspecified: Secondary | ICD-10-CM | POA: Diagnosis not present

## 2023-09-08 DIAGNOSIS — M51369 Other intervertebral disc degeneration, lumbar region without mention of lumbar back pain or lower extremity pain: Secondary | ICD-10-CM | POA: Diagnosis not present

## 2023-09-08 DIAGNOSIS — R2681 Unsteadiness on feet: Secondary | ICD-10-CM | POA: Diagnosis not present

## 2023-09-08 DIAGNOSIS — J4520 Mild intermittent asthma, uncomplicated: Secondary | ICD-10-CM

## 2023-09-08 DIAGNOSIS — Z4789 Encounter for other orthopedic aftercare: Secondary | ICD-10-CM | POA: Diagnosis not present

## 2023-09-08 DIAGNOSIS — M6259 Muscle wasting and atrophy, not elsewhere classified, multiple sites: Secondary | ICD-10-CM | POA: Diagnosis not present

## 2023-09-08 DIAGNOSIS — Z741 Need for assistance with personal care: Secondary | ICD-10-CM | POA: Diagnosis not present

## 2023-09-08 DIAGNOSIS — E782 Mixed hyperlipidemia: Secondary | ICD-10-CM

## 2023-09-08 DIAGNOSIS — K5909 Other constipation: Secondary | ICD-10-CM | POA: Diagnosis not present

## 2023-09-08 DIAGNOSIS — J45909 Unspecified asthma, uncomplicated: Secondary | ICD-10-CM | POA: Diagnosis not present

## 2023-09-08 DIAGNOSIS — M4802 Spinal stenosis, cervical region: Secondary | ICD-10-CM | POA: Diagnosis not present

## 2023-09-08 DIAGNOSIS — M62838 Other muscle spasm: Secondary | ICD-10-CM | POA: Diagnosis not present

## 2023-09-08 DIAGNOSIS — M4712 Other spondylosis with myelopathy, cervical region: Secondary | ICD-10-CM | POA: Diagnosis not present

## 2023-09-08 DIAGNOSIS — I4891 Unspecified atrial fibrillation: Secondary | ICD-10-CM | POA: Diagnosis not present

## 2023-09-08 MED ORDER — NITROGLYCERIN 0.4 MG SL SUBL: 0.4000 mg | SUBLINGUAL_TABLET | SUBLINGUAL | 3 refills | Status: AC | PRN

## 2023-09-08 MED ORDER — ROSUVASTATIN CALCIUM 10 MG PO TABS: 10.0000 mg | ORAL_TABLET | Freq: Every day | ORAL | 3 refills | Status: DC

## 2023-09-08 MED ORDER — FLUTICASONE FUROATE-VILANTEROL 200-25 MCG/ACT IN AEPB: 1.0000 | INHALATION_SPRAY | Freq: Every day | RESPIRATORY_TRACT | 2 refills | Status: DC

## 2023-09-08 MED ORDER — ROSUVASTATIN CALCIUM 5 MG PO TABS: 5.0000 mg | ORAL_TABLET | Freq: Every day | ORAL | 3 refills | Status: DC

## 2023-09-08 NOTE — Telephone Encounter (Signed)
Converted to refill request 

## 2023-09-08 NOTE — Telephone Encounter (Signed)
 Filled 5mg  Plus 10mg  tablets to equal 15mg  daily

## 2023-09-08 NOTE — Telephone Encounter (Signed)
 Copied from CRM [id number]. Topic: Clinical - Medication Refill >> Sep 08, 2023  9:59 AM Myrick T wrote: Medication:  amiodarone  (PACERONE ) 200 MG tablet   Has the patient contacted their pharmacy? No  This is the patient's preferred pharmacy:  [id number] - [place], [place] - [address]  Phone: [phone] Fax: [phone]  Is this the correct pharmacy for this prescription? Yes Has the prescription been filled recently? Yes  Is the patient out of the medication? Yes  Has the patient been seen for an appointment in the last year OR does the patient have an upcoming appointment? Yes  Can we respond through MyChart? Yes  Agent: Please be advised that Rx refills may take up to 3 business days. We ask that you follow-up with your pharmacy.  Patient stated she only has enough medication for today

## 2023-09-09 ENCOUNTER — Other Ambulatory Visit: Payer: Self-pay | Admitting: Cardiovascular Disease

## 2023-09-09 MED ORDER — AMIODARONE HCL 200 MG PO TABS: 200.0000 mg | ORAL_TABLET | Freq: Every day | ORAL | 0 refills | Status: DC

## 2023-09-09 NOTE — Telephone Encounter (Signed)
 The patient called back in stating she never picked up her previous prescription form Dr Darron for the amiodarone  (PACERONE ) 200 MG tablet that was prescribed on July 31st. She took her last pill yesterday and needs this as soon as possible. I spoke with Nat and she will pass this on to nurse Joseline to get this taken care of and will call the patient back when done. Please assist patient further.

## 2023-09-09 NOTE — Telephone Encounter (Signed)
 Amiodarone  is prescribed by Dr Darron and patient is going to check with him

## 2023-09-09 NOTE — Telephone Encounter (Signed)
 Pt called to inform Dr. Bacigalupo that the medication was never picked up and wants to know if it could be refilled again. Clinical follow up with pt please. Thank you

## 2023-09-12 ENCOUNTER — Other Ambulatory Visit: Payer: Self-pay

## 2023-09-12 DIAGNOSIS — J4520 Mild intermittent asthma, uncomplicated: Secondary | ICD-10-CM

## 2023-09-12 NOTE — Telephone Encounter (Signed)
Prescriptions were sent to the wrong pharmacy.

## 2023-09-12 NOTE — Telephone Encounter (Signed)
 Received another refill request for these medications from [facility].  I do not see that these were sent to [facility].  Please send to correct pharmacy

## 2023-09-14 ENCOUNTER — Institutional Professional Consult (permissible substitution): Admitting: Cardiology

## 2023-09-14 DIAGNOSIS — M4802 Spinal stenosis, cervical region: Secondary | ICD-10-CM | POA: Diagnosis not present

## 2023-09-14 DIAGNOSIS — M6259 Muscle wasting and atrophy, not elsewhere classified, multiple sites: Secondary | ICD-10-CM | POA: Diagnosis not present

## 2023-09-14 DIAGNOSIS — M51369 Other intervertebral disc degeneration, lumbar region without mention of lumbar back pain or lower extremity pain: Secondary | ICD-10-CM | POA: Diagnosis not present

## 2023-09-14 DIAGNOSIS — M62838 Other muscle spasm: Secondary | ICD-10-CM | POA: Diagnosis not present

## 2023-09-14 DIAGNOSIS — Z87891 Personal history of nicotine dependence: Secondary | ICD-10-CM | POA: Diagnosis not present

## 2023-09-14 DIAGNOSIS — K5909 Other constipation: Secondary | ICD-10-CM | POA: Diagnosis not present

## 2023-09-14 DIAGNOSIS — Z741 Need for assistance with personal care: Secondary | ICD-10-CM | POA: Diagnosis not present

## 2023-09-14 DIAGNOSIS — I4891 Unspecified atrial fibrillation: Secondary | ICD-10-CM | POA: Diagnosis not present

## 2023-09-14 DIAGNOSIS — R2681 Unsteadiness on feet: Secondary | ICD-10-CM | POA: Diagnosis not present

## 2023-09-14 DIAGNOSIS — Z4789 Encounter for other orthopedic aftercare: Secondary | ICD-10-CM | POA: Diagnosis not present

## 2023-09-14 DIAGNOSIS — E789 Disorder of lipoprotein metabolism, unspecified: Secondary | ICD-10-CM | POA: Diagnosis not present

## 2023-09-14 DIAGNOSIS — M4712 Other spondylosis with myelopathy, cervical region: Secondary | ICD-10-CM | POA: Diagnosis not present

## 2023-09-15 ENCOUNTER — Encounter: Payer: Self-pay | Admitting: Family Medicine

## 2023-09-15 ENCOUNTER — Ambulatory Visit: Admitting: Family Medicine

## 2023-09-15 VITALS — BP 137/50 | HR 56 | Ht 65.0 in | Wt 170.0 lb

## 2023-09-15 DIAGNOSIS — I10 Essential (primary) hypertension: Secondary | ICD-10-CM | POA: Diagnosis not present

## 2023-09-15 DIAGNOSIS — I4891 Unspecified atrial fibrillation: Secondary | ICD-10-CM

## 2023-09-15 DIAGNOSIS — R739 Hyperglycemia, unspecified: Secondary | ICD-10-CM | POA: Diagnosis not present

## 2023-09-15 DIAGNOSIS — J4531 Mild persistent asthma with (acute) exacerbation: Secondary | ICD-10-CM

## 2023-09-15 DIAGNOSIS — N1831 Chronic kidney disease, stage 3a: Secondary | ICD-10-CM

## 2023-09-15 DIAGNOSIS — E782 Mixed hyperlipidemia: Secondary | ICD-10-CM | POA: Diagnosis not present

## 2023-09-15 MED ORDER — TRELEGY ELLIPTA 100-62.5-25 MCG/ACT IN AEPB: 1.0000 | INHALATION_SPRAY | Freq: Every day | RESPIRATORY_TRACT | 11 refills | Status: AC

## 2023-09-15 MED ORDER — LISINOPRIL 40 MG PO TABS: 40.0000 mg | ORAL_TABLET | Freq: Every day | ORAL | 1 refills | Status: DC

## 2023-09-15 MED ORDER — ROSUVASTATIN CALCIUM 20 MG PO TABS: 20.0000 mg | ORAL_TABLET | Freq: Every day | ORAL | 1 refills | Status: DC

## 2023-09-15 NOTE — Assessment & Plan Note (Signed)
 Currently not well controlled on medication regimen. Reports home measurements of 120s/60s this week with spikes in the 150s 3-4 times a week. In office BP of 137/50. Denies any symptoms or adverse medication side effects. Last CMP was stable. - Increase lisinopril  to 40 mg daily - Order CMP at next follow up

## 2023-09-15 NOTE — Progress Notes (Addendum)
 Established Patient Office Visit  Subjective   Patient ID: Whitney Stuart, female    DOB: Apr 11, 1950  Age: 73 y.o. MRN: [id number]  Chief Complaint  Patient presents with   Hypertension    Whitney Stuart is a 73 yo F with a history of HTN, Afib, asthma, and CKD who presents to the clinic for follow up on her chronic conditions.  On interview, she reports doing well since her last appointment. She reports trying to manage her HTN as best as she can. She states that her home measures are often higher than measure in the office today. She reports good control of her Afib in the interim on amiodarone  and eliquis . She reports worsening of her asthma, particularly in the morning. She reports not having to use her albuterol  rescue inhaler yet, but reports wanting to use it every 6 hours to help her breathe. She also reports a history of occasional tremors associated with her inhaler use. She reports no other symptoms including loss of consciousness, confusion, loss or urinary or bowel function. Lastly, she reports consistent follow up with her cardiologist and nephrologist for her Afib and CKD.  She reports no other concerns today.      ROS    Objective:     BP (!) 137/50   Pulse (!) 56   Ht 5' 5 (1.651 m)   Wt 170 lb (77.1 kg)   SpO2 97%   BMI 28.29 kg/m    Physical Exam Vitals reviewed.  Constitutional:      General: She is not in acute distress.    Appearance: Normal appearance. She is not ill-appearing or diaphoretic.  HENT:     Head: Normocephalic.     Right Ear: External ear normal.     Left Ear: External ear normal.     Nose: Nose normal.     Mouth/Throat:     Mouth: Mucous membranes are moist.  Eyes:     General: No scleral icterus.    Conjunctiva/sclera: Conjunctivae normal.  Cardiovascular:     Rate and Rhythm: Normal rate and regular rhythm.     Pulses: Normal pulses.     Heart sounds: Normal heart sounds. No murmur heard.    No friction rub. No gallop.   Pulmonary:     Effort: Pulmonary effort is normal. No respiratory distress.     Breath sounds: Normal breath sounds. No wheezing.  Abdominal:     General: There is no distension.     Palpations: Abdomen is soft.     Tenderness: There is no abdominal tenderness. There is no guarding.  Musculoskeletal:     Cervical back: Normal range of motion.     Right lower leg: No edema.     Left lower leg: No edema.  Skin:    General: Skin is warm and dry.     Capillary Refill: Capillary refill takes less than 2 seconds.  Neurological:     Mental Status: She is alert and oriented to person, place, and time.  Psychiatric:        Mood and Affect: Mood normal.      No results found for any visits on 09/15/23.    The ASCVD Risk score (Arnett DK, et al., 2019) failed to calculate for the following reasons:   Risk score cannot be calculated because patient has a medical history suggesting prior/existing ASCVD    Assessment & Plan:   Problem List Items Addressed This Visit     Asthma, chronic  Currently not well controlled on medication regimen. Reports worsening shortness of breath in the morning along with thoughts of using albuterol  every 6 hours to help provide relief. Currently on Breo and albuterol . Experiencing occasional side effects from LABA including tremor after use, but denies any other symptoms. States that tremor is not overtly bothersome at the moment. - Continue Breo until Trelegy is approved by insurance, switching to only Trelegy once a day - Continue albuterol  as needed for rescue        Relevant Medications   Fluticasone -Umeclidin-Vilant (TRELEGY ELLIPTA ) 100-62.5-25 MCG/ACT AEPB   Essential (primary) hypertension - Primary   Currently not well controlled on medication regimen. Reports home measurements of 120s/60s this week with spikes in the 150s 3-4 times a week. In office BP of 137/50. Denies any symptoms or adverse medication side effects. Last CMP was stable. -  Increase lisinopril  to 40 mg daily - Order CMP at next follow up       Relevant Medications   lisinopril  (ZESTRIL ) 40 MG tablet   rosuvastatin  (CRESTOR ) 20 MG tablet   Chronic kidney disease, stage 3a (HCC)   Hyperlipidemia, unspecified   Relevant Medications   lisinopril  (ZESTRIL ) 40 MG tablet   rosuvastatin  (CRESTOR ) 20 MG tablet   Other Relevant Orders   Lipid Profile   Atrial fibrillation with rapid ventricular response (HCC)   Currently well controlled on medication regimen. Reports one episode of symptoms including chest tightness when she ran out of amiodarone  briefly. Denies any other episodes symptoms or adverse medication side effects. Followed by cardiology - Continue amiodarone  and Eliquis  - Continue to monitor LFTs, PFTs, and TFTs with amiodarone  use - Follow up with cardiology as scheduled      Relevant Medications   lisinopril  (ZESTRIL ) 40 MG tablet   rosuvastatin  (CRESTOR ) 20 MG tablet   Other Visit Diagnoses       Hyperglycemia       Relevant Orders   HgB A1c      Chronic Kidney Disease Currently well controlled with medication and followed by nephrology. Last CMP was stable. - Continue to follow up with nephrology as scheduled  Hyperlipidemia Currently well controlled with medication. Last FLP was stable - Increase rosuvastatin  to 20 mg daily  Hyperglycemia History of hyperglycemia on past CMPs. - Order A1C  General Health Maintenance - Will address at follow up in 3 months  Return in about 3 months (around 12/16/2023) for chronic disease f/u.    Elia LULLA Blanch, Medical Student  Patient seen along with MS3 student, Elia Blanch. I personally evaluated this patient along with the student, and verified all aspects of the history, physical exam, and medical decision making as documented by the student. I agree with the student's documentation and have made all necessary edits.  Myrla Jon HERO, MD, MPH [facility]     Andendum: Patient has now progressed in mobility goals and requires single point cane, not walker  Mobility impairment requiring walker and cane Mobility impairment persists, requiring the use of a walker and occasional use of a cane for short distances. Medicare requires documentation for cane prescription due to previous use of a walker. - Documented transition to cane use for short distances in the house. - Ensured cane prescription is available if needed in the future. - Requires single point cane for mobility around her home.  Had cervical myelopathy and is healing after surgery. She is able to use it safely and accommodate it in  her home.

## 2023-09-15 NOTE — Assessment & Plan Note (Addendum)
 Currently not well controlled on medication regimen. Reports worsening shortness of breath in the morning along with thoughts of using albuterol  every 6 hours to help provide relief. Currently on Breo and albuterol . Experiencing occasional side effects from LABA including tremor after use, but denies any other symptoms. States that tremor is not overtly bothersome at the moment. - Continue Breo until Trelegy is approved by insurance, switching to only Trelegy once a day - Continue albuterol  as needed for rescue

## 2023-09-15 NOTE — Assessment & Plan Note (Signed)
 Currently well controlled on medication regimen. Reports one episode of symptoms including chest tightness when she ran out of amiodarone  briefly. Denies any other episodes symptoms or adverse medication side effects. Followed by cardiology - Continue amiodarone  and Eliquis  - Continue to monitor LFTs, PFTs, and TFTs with amiodarone  use - Follow up with cardiology as scheduled

## 2023-09-16 ENCOUNTER — Ambulatory Visit: Payer: Self-pay | Admitting: Family Medicine

## 2023-09-16 LAB — LIPID PANEL
Chol/HDL Ratio: 2.4 ratio (ref 0.0–4.4)
Cholesterol, Total: 151 mg/dL (ref 100–199)
HDL: 64 mg/dL (ref >39)
LDL Chol Calc (NIH): 69 mg/dL (ref 0–99)
Triglycerides: 100 mg/dL (ref 0–149)
VLDL Cholesterol Cal: 18 mg/dL (ref 5–40)

## 2023-09-16 LAB — HEMOGLOBIN A1C
Est. average glucose Bld gHb Est-mCnc: 111 mg/dL
Hgb A1c MFr Bld: 5.5 % (ref 4.8–5.6)

## 2023-09-22 DIAGNOSIS — I4891 Unspecified atrial fibrillation: Secondary | ICD-10-CM | POA: Diagnosis not present

## 2023-09-22 DIAGNOSIS — M6259 Muscle wasting and atrophy, not elsewhere classified, multiple sites: Secondary | ICD-10-CM | POA: Diagnosis not present

## 2023-09-22 DIAGNOSIS — K5909 Other constipation: Secondary | ICD-10-CM | POA: Diagnosis not present

## 2023-09-22 DIAGNOSIS — J45909 Unspecified asthma, uncomplicated: Secondary | ICD-10-CM | POA: Diagnosis not present

## 2023-09-22 DIAGNOSIS — R2681 Unsteadiness on feet: Secondary | ICD-10-CM | POA: Diagnosis not present

## 2023-09-22 DIAGNOSIS — M51369 Other intervertebral disc degeneration, lumbar region without mention of lumbar back pain or lower extremity pain: Secondary | ICD-10-CM | POA: Diagnosis not present

## 2023-09-22 DIAGNOSIS — E789 Disorder of lipoprotein metabolism, unspecified: Secondary | ICD-10-CM | POA: Diagnosis not present

## 2023-09-22 DIAGNOSIS — M62838 Other muscle spasm: Secondary | ICD-10-CM | POA: Diagnosis not present

## 2023-09-22 DIAGNOSIS — M4802 Spinal stenosis, cervical region: Secondary | ICD-10-CM | POA: Diagnosis not present

## 2023-09-22 DIAGNOSIS — Z741 Need for assistance with personal care: Secondary | ICD-10-CM | POA: Diagnosis not present

## 2023-09-22 DIAGNOSIS — Z4789 Encounter for other orthopedic aftercare: Secondary | ICD-10-CM | POA: Diagnosis not present

## 2023-09-22 DIAGNOSIS — M4712 Other spondylosis with myelopathy, cervical region: Secondary | ICD-10-CM | POA: Diagnosis not present

## 2023-09-22 DIAGNOSIS — Z87891 Personal history of nicotine dependence: Secondary | ICD-10-CM | POA: Diagnosis not present

## 2023-09-23 DIAGNOSIS — K5909 Other constipation: Secondary | ICD-10-CM | POA: Diagnosis not present

## 2023-09-23 DIAGNOSIS — J45909 Unspecified asthma, uncomplicated: Secondary | ICD-10-CM | POA: Diagnosis not present

## 2023-09-23 DIAGNOSIS — E789 Disorder of lipoprotein metabolism, unspecified: Secondary | ICD-10-CM | POA: Diagnosis not present

## 2023-09-23 DIAGNOSIS — M4802 Spinal stenosis, cervical region: Secondary | ICD-10-CM | POA: Diagnosis not present

## 2023-09-23 DIAGNOSIS — M51369 Other intervertebral disc degeneration, lumbar region without mention of lumbar back pain or lower extremity pain: Secondary | ICD-10-CM | POA: Diagnosis not present

## 2023-09-23 DIAGNOSIS — M62838 Other muscle spasm: Secondary | ICD-10-CM | POA: Diagnosis not present

## 2023-09-23 DIAGNOSIS — R2681 Unsteadiness on feet: Secondary | ICD-10-CM | POA: Diagnosis not present

## 2023-09-23 DIAGNOSIS — I4891 Unspecified atrial fibrillation: Secondary | ICD-10-CM | POA: Diagnosis not present

## 2023-09-23 DIAGNOSIS — M6259 Muscle wasting and atrophy, not elsewhere classified, multiple sites: Secondary | ICD-10-CM | POA: Diagnosis not present

## 2023-09-23 DIAGNOSIS — Z741 Need for assistance with personal care: Secondary | ICD-10-CM | POA: Diagnosis not present

## 2023-09-23 DIAGNOSIS — Z87891 Personal history of nicotine dependence: Secondary | ICD-10-CM | POA: Diagnosis not present

## 2023-09-23 DIAGNOSIS — Z4789 Encounter for other orthopedic aftercare: Secondary | ICD-10-CM | POA: Diagnosis not present

## 2023-09-23 DIAGNOSIS — M4712 Other spondylosis with myelopathy, cervical region: Secondary | ICD-10-CM | POA: Diagnosis not present

## 2023-09-27 ENCOUNTER — Other Ambulatory Visit: Payer: Self-pay | Admitting: Family Medicine

## 2023-09-27 DIAGNOSIS — J4520 Mild intermittent asthma, uncomplicated: Secondary | ICD-10-CM

## 2023-09-27 MED ORDER — CLONAZEPAM 0.5 MG PO TABS: 0.5000 mg | ORAL_TABLET | Freq: Two times a day (BID) | ORAL | 0 refills | Status: AC | PRN

## 2023-09-27 MED ORDER — ALBUTEROL SULFATE HFA 108 (90 BASE) MCG/ACT IN AERS: 1.0000 | INHALATION_SPRAY | Freq: Four times a day (QID) | RESPIRATORY_TRACT | 2 refills | Status: AC | PRN

## 2023-09-27 NOTE — Telephone Encounter (Signed)
 [facility] faxed refill request for the following medications:  albuterol  (VENTOLIN  HFA) 108 (90 Base) MCG/ACT inhaler   amiodarone  (PACERONE ) 200 MG tablet   clonazePAM  (KLONOPIN ) 0.5 MG tablet    Please advise.

## 2023-09-29 ENCOUNTER — Other Ambulatory Visit: Payer: Self-pay | Admitting: Family Medicine

## 2023-09-29 DIAGNOSIS — M4802 Spinal stenosis, cervical region: Secondary | ICD-10-CM | POA: Diagnosis not present

## 2023-09-29 DIAGNOSIS — E789 Disorder of lipoprotein metabolism, unspecified: Secondary | ICD-10-CM | POA: Diagnosis not present

## 2023-09-29 DIAGNOSIS — I4891 Unspecified atrial fibrillation: Secondary | ICD-10-CM | POA: Diagnosis not present

## 2023-09-29 DIAGNOSIS — R2681 Unsteadiness on feet: Secondary | ICD-10-CM | POA: Diagnosis not present

## 2023-09-29 DIAGNOSIS — Z87891 Personal history of nicotine dependence: Secondary | ICD-10-CM | POA: Diagnosis not present

## 2023-09-29 DIAGNOSIS — M62838 Other muscle spasm: Secondary | ICD-10-CM | POA: Diagnosis not present

## 2023-09-29 DIAGNOSIS — J45909 Unspecified asthma, uncomplicated: Secondary | ICD-10-CM | POA: Diagnosis not present

## 2023-09-29 DIAGNOSIS — K5909 Other constipation: Secondary | ICD-10-CM | POA: Diagnosis not present

## 2023-09-29 DIAGNOSIS — M51369 Other intervertebral disc degeneration, lumbar region without mention of lumbar back pain or lower extremity pain: Secondary | ICD-10-CM | POA: Diagnosis not present

## 2023-09-29 DIAGNOSIS — M6259 Muscle wasting and atrophy, not elsewhere classified, multiple sites: Secondary | ICD-10-CM | POA: Diagnosis not present

## 2023-09-29 DIAGNOSIS — Z4789 Encounter for other orthopedic aftercare: Secondary | ICD-10-CM | POA: Diagnosis not present

## 2023-09-29 DIAGNOSIS — Z741 Need for assistance with personal care: Secondary | ICD-10-CM | POA: Diagnosis not present

## 2023-09-29 DIAGNOSIS — M4712 Other spondylosis with myelopathy, cervical region: Secondary | ICD-10-CM | POA: Diagnosis not present

## 2023-09-30 DIAGNOSIS — M62838 Other muscle spasm: Secondary | ICD-10-CM | POA: Diagnosis not present

## 2023-09-30 DIAGNOSIS — Z4789 Encounter for other orthopedic aftercare: Secondary | ICD-10-CM | POA: Diagnosis not present

## 2023-09-30 DIAGNOSIS — I4891 Unspecified atrial fibrillation: Secondary | ICD-10-CM | POA: Diagnosis not present

## 2023-09-30 DIAGNOSIS — M51369 Other intervertebral disc degeneration, lumbar region without mention of lumbar back pain or lower extremity pain: Secondary | ICD-10-CM | POA: Diagnosis not present

## 2023-09-30 DIAGNOSIS — M6259 Muscle wasting and atrophy, not elsewhere classified, multiple sites: Secondary | ICD-10-CM | POA: Diagnosis not present

## 2023-09-30 DIAGNOSIS — R2681 Unsteadiness on feet: Secondary | ICD-10-CM | POA: Diagnosis not present

## 2023-09-30 DIAGNOSIS — M4802 Spinal stenosis, cervical region: Secondary | ICD-10-CM | POA: Diagnosis not present

## 2023-09-30 DIAGNOSIS — E789 Disorder of lipoprotein metabolism, unspecified: Secondary | ICD-10-CM | POA: Diagnosis not present

## 2023-09-30 DIAGNOSIS — K5909 Other constipation: Secondary | ICD-10-CM | POA: Diagnosis not present

## 2023-09-30 DIAGNOSIS — J45909 Unspecified asthma, uncomplicated: Secondary | ICD-10-CM | POA: Diagnosis not present

## 2023-09-30 DIAGNOSIS — Z741 Need for assistance with personal care: Secondary | ICD-10-CM | POA: Diagnosis not present

## 2023-09-30 DIAGNOSIS — Z87891 Personal history of nicotine dependence: Secondary | ICD-10-CM | POA: Diagnosis not present

## 2023-10-02 ENCOUNTER — Other Ambulatory Visit: Payer: Self-pay | Admitting: Orthopedic Surgery

## 2023-10-02 DIAGNOSIS — G959 Disease of spinal cord, unspecified: Secondary | ICD-10-CM

## 2023-10-02 DIAGNOSIS — Z981 Arthrodesis status: Secondary | ICD-10-CM

## 2023-10-02 NOTE — Progress Notes (Deleted)
   REFERRING PHYSICIAN:  Myrla Jon HERO, Md [address] [place],  [place] [address]  DOS: 07/16/23  ACDF C3-C6 with corpectomy C4-C5, posterior cervical fusion C3-C6  HISTORY OF PRESENT ILLNESS:  She was doing well at her last visit with minimal pain.       She was discharged to SNF on 07/23/23.   She is not having any significant pain. She still has numbness/tingling in her hands that is constant- the numbness/tingling in her arm is gone! She is working with PT and feels like she is stronger. She is ambulating with a walker. Has been walking down the halls.   She has not restarted ELIQUIS  yet. She is taking oxycodone  prn- usually takes one at night.   She is wearing her collar when up out of bed.    PHYSICAL EXAMINATION:  NEUROLOGICAL:  General: In no acute distress.   Awake, alert, oriented to person, place, and time.  Pupils equal round and reactive to light.  Facial tone is symmetric.    Strength: Side Biceps Triceps Deltoid Interossei Grip Wrist Ext. Wrist Flex.  R 5 5 5 5  4+*** 5 5  L 4-*** 4-*** 5 5 4+*** 4+*** 4+***   Incisions c/d/i  Imaging:  Cervical xrays dated ***:  No complications noted.***  Report for above xrays not yet available.   Assessment / Plan: Whitney Stuart is doing well s/p above surgery. Treatment options reviewed with patient and following plan made:   - We discussed activity escalation and I have advised the patient to lift up to 10 pounds until 6 weeks after surgery (until follow up with Dr. Sharps).   - Reviewed wound care. Okay to keep incisions covered with dry dressing while at SNF. Change daily.  - Okay to restart ELIQUIS  5mg  bid. Can restart today.  - Continue current medications including prn oxycocone and prn flexeril .  - She needs to wear collar when up and walking. Needs to wear when in car. She can remove collar if she is sitting and watching TV. Can remove collar to sleep and eat as well.  - Follow up as  scheduled in 4 weeks and prn. Will need xrays prior to this visit.   Advised to contact the office if any questions or concerns arise.   Glade Boys PA-C Dept of Neurosurgery

## 2023-10-03 ENCOUNTER — Encounter: Admitting: Orthopedic Surgery

## 2023-10-05 ENCOUNTER — Telehealth: Payer: Self-pay

## 2023-10-05 ENCOUNTER — Other Ambulatory Visit: Payer: Self-pay | Admitting: Family Medicine

## 2023-10-05 ENCOUNTER — Encounter: Admitting: Orthopedic Surgery

## 2023-10-05 DIAGNOSIS — Z87891 Personal history of nicotine dependence: Secondary | ICD-10-CM | POA: Diagnosis not present

## 2023-10-05 DIAGNOSIS — Z4789 Encounter for other orthopedic aftercare: Secondary | ICD-10-CM | POA: Diagnosis not present

## 2023-10-05 DIAGNOSIS — R2681 Unsteadiness on feet: Secondary | ICD-10-CM | POA: Diagnosis not present

## 2023-10-05 DIAGNOSIS — M4712 Other spondylosis with myelopathy, cervical region: Secondary | ICD-10-CM | POA: Diagnosis not present

## 2023-10-05 DIAGNOSIS — K5909 Other constipation: Secondary | ICD-10-CM | POA: Diagnosis not present

## 2023-10-05 DIAGNOSIS — G959 Disease of spinal cord, unspecified: Secondary | ICD-10-CM

## 2023-10-05 DIAGNOSIS — I4891 Unspecified atrial fibrillation: Secondary | ICD-10-CM | POA: Diagnosis not present

## 2023-10-05 DIAGNOSIS — E789 Disorder of lipoprotein metabolism, unspecified: Secondary | ICD-10-CM | POA: Diagnosis not present

## 2023-10-05 DIAGNOSIS — M62838 Other muscle spasm: Secondary | ICD-10-CM | POA: Diagnosis not present

## 2023-10-05 DIAGNOSIS — J45909 Unspecified asthma, uncomplicated: Secondary | ICD-10-CM | POA: Diagnosis not present

## 2023-10-05 DIAGNOSIS — M51369 Other intervertebral disc degeneration, lumbar region without mention of lumbar back pain or lower extremity pain: Secondary | ICD-10-CM | POA: Diagnosis not present

## 2023-10-05 DIAGNOSIS — M6259 Muscle wasting and atrophy, not elsewhere classified, multiple sites: Secondary | ICD-10-CM | POA: Diagnosis not present

## 2023-10-05 DIAGNOSIS — M4802 Spinal stenosis, cervical region: Secondary | ICD-10-CM | POA: Diagnosis not present

## 2023-10-05 NOTE — Telephone Encounter (Signed)
 Copied from CRM [id number]. Topic: Clinical - Order For Equipment >> Oct 05, 2023 10:33 AM Wess RAMAN wrote: Reason for CRM: Kate HACKNEY , from [facility] would like to request an order for a single point cane for patient.   Callback #: [phone]

## 2023-10-05 NOTE — Telephone Encounter (Signed)
 Copied from CRM [id number]. Topic: General - Other >> Oct 05, 2023 10:31 AM Wess RAMAN wrote: Reason for CRM: Kate from [facility] would like to let Dr. Myrla know that patient's heart rate was 50 BPM but after doing exercise it went back to 74BMP.  Callback #: [phone]

## 2023-10-06 ENCOUNTER — Other Ambulatory Visit (HOSPITAL_COMMUNITY): Payer: Self-pay

## 2023-10-06 NOTE — Telephone Encounter (Signed)
 Spoke with patient, states she has not been taking this medication and is okay without the refill.   Patient is also wondering if you are willing to accept her sister as a new patient? I advised her that you are currently only accepting new Newborn patients

## 2023-10-06 NOTE — Telephone Encounter (Signed)
Please see the message below and advise.

## 2023-10-06 NOTE — Telephone Encounter (Signed)
 Ok to send written order to DME company of her choice for cane. Can use cervical myelopathy from her problem list as diagnosis.

## 2023-10-06 NOTE — Telephone Encounter (Signed)
 Looks like this med was d/c'd at hospital discharge in June.  Need to check with patient and see if she has been taking it this whole time.

## 2023-10-07 NOTE — Telephone Encounter (Signed)
 Spoke with patient, order placed with [employer].

## 2023-10-07 NOTE — Addendum Note (Signed)
 Addended by: CHERRY CHIQUITA HERO on: 10/07/2023 12:06 PM   Modules accepted: Orders

## 2023-10-10 ENCOUNTER — Other Ambulatory Visit (HOSPITAL_COMMUNITY): Payer: Self-pay

## 2023-10-12 ENCOUNTER — Other Ambulatory Visit: Payer: Self-pay

## 2023-10-12 ENCOUNTER — Encounter: Payer: Self-pay | Admitting: Cardiology

## 2023-10-12 ENCOUNTER — Ambulatory Visit: Attending: Cardiology | Admitting: Cardiology

## 2023-10-12 VITALS — BP 160/68 | HR 57 | Ht 66.0 in | Wt 175.4 lb

## 2023-10-12 DIAGNOSIS — Z79899 Other long term (current) drug therapy: Secondary | ICD-10-CM

## 2023-10-12 DIAGNOSIS — I4819 Other persistent atrial fibrillation: Secondary | ICD-10-CM

## 2023-10-12 NOTE — Patient Instructions (Addendum)
 Medication Instructions:  Your physician recommends that you continue on your current medications as directed. Please refer to the Current Medication list given to you today.  *If you need a refill on your cardiac medications before your next appointment, please call your pharmacy*  Labs: TODAY: CMET, TSH, T4, CBC  Testing/Procedures: Ablation Your physician has recommended that you have an ablation. Catheter ablation is a medical procedure used to treat some cardiac arrhythmias (irregular heartbeats). During catheter ablation, a long, thin, flexible tube is put into a blood vessel in your groin (upper thigh), or neck. This tube is called an ablation catheter. It is then guided to your heart through the blood vessel. Radio frequency waves destroy small areas of heart tissue where abnormal heartbeats may cause an arrhythmia to start.  Please see instruction letter given to you today.    Follow-Up: We will contact you to schedule your post-procedure appointments.

## 2023-10-12 NOTE — H&P (View-Only) (Signed)
  Electrophysiology Office Note:    Date:  10/12/2023   ID:  Whitney Stuart, DOB [birth year], MRN [id number]  [facility] Cardiologist:  Whitney Cage, MD  [facility] Electrophysiologist:  Whitney ONEIDA HOLTS, MD   Referring MD: Whitney Jon HERO, MD   Chief Complaint: Atrial fibrillation  History of Present Illness:    Whitney Stuart is a 73 year old woman who I am seeing today for an evaluation of atrial fibrillation at the request of Dr. Sherel.  The patient has a history of hypertension, hyperlipidemia and secondhand smoking.  She was last seen by Dr. Cage August 09, 2023.  She was hospitalized in June with chest discomfort in the setting of rapidly conducted atrial fibrillation.  Left heart cath showed normal coronary anatomy.  She has normal EF.  She is doing well.  She is tolerating the amiodarone  but would like to avoid indefinite exposure.  She is with her daughter today who is a paramedic.    Their past medical, social and family history was reviewed.   ROS:   Please see the history of present illness.    All other systems reviewed and are negative.  EKGs/Labs/Other Studies Reviewed:    The following studies were reviewed today:         Physical Exam:    VS:  BP (!) 160/68 (BP Location: Left Arm, Patient Position: Sitting, Cuff Size: Normal)   Pulse (!) 57   Ht 5' 6 (1.676 m)   Wt 175 lb 6 oz (79.5 kg)   SpO2 98%   BMI 28.31 kg/m     Wt Readings from Last 3 Encounters:  10/12/23 175 lb 6 oz (79.5 kg)  09/15/23 170 lb (77.1 kg)  08/31/23 179 lb 3.2 oz (81.3 kg)     GEN: no distress CARD: RRR, No MRG RESP: No IWOB. CTAB.        ASSESSMENT AND PLAN:    1. Persistent atrial fibrillation (HCC)   2. Encounter for long-term (current) use of high-risk medication      #Persistent atrial fibrillation #High risk med monitoring-amiodarone  Episodes in the past been complicated by chest pain and rapid ventricular rates.  She is on Eliquis  for stroke  prophylaxis. She is now in sinus rhythm on amiodarone .  We discussed alternatives to amiodarone  including Tikosyn and catheter ablation during today's office visit.  Discussed treatment options today for AF including antiarrhythmic drug therapy and ablation. Discussed risks, recovery and likelihood of success with each treatment strategy. Risk, benefits, and alternatives to EP study and ablation for afib were discussed. These risks include but are not limited to stroke, bleeding, vascular damage, tamponade, perforation, damage to the esophagus, lungs, phrenic nerve and other structures, pulmonary vein stenosis, worsening renal function, coronary vasospasm and death.  Discussed potential need for repeat ablation procedures and antiarrhythmic drugs after an initial ablation. The patient understands these risk and wishes to proceed.  We will therefore proceed with catheter ablation at the next available time.  Carto, ICE, anesthesia are requested for the procedure.    We will obtain CT PV protocol prior to the procedure.  We will repeat a CMP, TSH and free T4 today.       Signed, Whitney ONEIDA. HOLTS, MD, [facility], [facility] 10/12/2023 2:27 PM    Electrophysiology [facility]

## 2023-10-12 NOTE — Progress Notes (Signed)
  Electrophysiology Office Note:    Date:  10/12/2023   ID:  Whitney Stuart, DOB [birth year], MRN [id number]  [facility] Cardiologist:  Deatrice Cage, MD  [facility] Electrophysiologist:  OLE ONEIDA HOLTS, MD   Referring MD: Myrla Jon HERO, MD   Chief Complaint: Atrial fibrillation  History of Present Illness:    Whitney Stuart is a 73 year old woman who I am seeing today for an evaluation of atrial fibrillation at the request of Dr. Sherel.  The patient has a history of hypertension, hyperlipidemia and secondhand smoking.  She was last seen by Dr. Cage August 09, 2023.  She was hospitalized in June with chest discomfort in the setting of rapidly conducted atrial fibrillation.  Left heart cath showed normal coronary anatomy.  She has normal EF.  She is doing well.  She is tolerating the amiodarone  but would like to avoid indefinite exposure.  She is with her daughter today who is a paramedic.    Their past medical, social and family history was reviewed.   ROS:   Please see the history of present illness.    All other systems reviewed and are negative.  EKGs/Labs/Other Studies Reviewed:    The following studies were reviewed today:         Physical Exam:    VS:  BP (!) 160/68 (BP Location: Left Arm, Patient Position: Sitting, Cuff Size: Normal)   Pulse (!) 57   Ht 5' 6 (1.676 m)   Wt 175 lb 6 oz (79.5 kg)   SpO2 98%   BMI 28.31 kg/m     Wt Readings from Last 3 Encounters:  10/12/23 175 lb 6 oz (79.5 kg)  09/15/23 170 lb (77.1 kg)  08/31/23 179 lb 3.2 oz (81.3 kg)     GEN: no distress CARD: RRR, No MRG RESP: No IWOB. CTAB.        ASSESSMENT AND PLAN:    1. Persistent atrial fibrillation (HCC)   2. Encounter for long-term (current) use of high-risk medication      #Persistent atrial fibrillation #High risk med monitoring-amiodarone  Episodes in the past been complicated by chest pain and rapid ventricular rates.  She is on Eliquis  for stroke  prophylaxis. She is now in sinus rhythm on amiodarone .  We discussed alternatives to amiodarone  including Tikosyn and catheter ablation during today's office visit.  Discussed treatment options today for AF including antiarrhythmic drug therapy and ablation. Discussed risks, recovery and likelihood of success with each treatment strategy. Risk, benefits, and alternatives to EP study and ablation for afib were discussed. These risks include but are not limited to stroke, bleeding, vascular damage, tamponade, perforation, damage to the esophagus, lungs, phrenic nerve and other structures, pulmonary vein stenosis, worsening renal function, coronary vasospasm and death.  Discussed potential need for repeat ablation procedures and antiarrhythmic drugs after an initial ablation. The patient understands these risk and wishes to proceed.  We will therefore proceed with catheter ablation at the next available time.  Carto, ICE, anesthesia are requested for the procedure.    We will obtain CT PV protocol prior to the procedure.  We will repeat a CMP, TSH and free T4 today.       Signed, OLE ONEIDA. HOLTS, MD, [facility], [facility] 10/12/2023 2:27 PM    Electrophysiology [facility]

## 2023-10-13 ENCOUNTER — Encounter (HOSPITAL_COMMUNITY): Payer: Self-pay

## 2023-10-13 ENCOUNTER — Ambulatory Visit: Payer: Self-pay | Admitting: Cardiology

## 2023-10-13 LAB — T4, FREE: Free T4: 1.52 ng/dL (ref 0.82–1.77)

## 2023-10-13 LAB — COMPREHENSIVE METABOLIC PANEL WITH GFR
ALT: 15 IU/L (ref 0–32)
AST: 12 IU/L (ref 0–40)
Albumin: 3.9 g/dL (ref 3.8–4.8)
Alkaline Phosphatase: 73 IU/L (ref 49–135)
BUN/Creatinine Ratio: 9 — ABNORMAL LOW (ref 12–28)
BUN: 11 mg/dL (ref 8–27)
Bilirubin Total: 0.5 mg/dL (ref 0.0–1.2)
CO2: 20 mmol/L (ref 20–29)
Calcium: 9.1 mg/dL (ref 8.7–10.3)
Chloride: 108 mmol/L — ABNORMAL HIGH (ref 96–106)
Creatinine, Ser: 1.24 mg/dL — ABNORMAL HIGH (ref 0.57–1.00)
Globulin, Total: 2.7 g/dL (ref 1.5–4.5)
Glucose: 91 mg/dL (ref 70–99)
Potassium: 4.9 mmol/L (ref 3.5–5.2)
Sodium: 142 mmol/L (ref 134–144)
Total Protein: 6.6 g/dL (ref 6.0–8.5)
eGFR: 46 mL/min/1.73 — ABNORMAL LOW (ref >59)

## 2023-10-13 LAB — CBC
Hematocrit: 35.1 % (ref 34.0–46.6)
Hemoglobin: 11 g/dL — ABNORMAL LOW (ref 11.1–15.9)
MCH: 29.8 pg (ref 26.6–33.0)
MCHC: 31.3 g/dL — ABNORMAL LOW (ref 31.5–35.7)
MCV: 95 fL (ref 79–97)
Platelets: 338 x10E3/uL (ref 150–450)
RBC: 3.69 x10E6/uL — ABNORMAL LOW (ref 3.77–5.28)
RDW: 13.1 % (ref 11.7–15.4)
WBC: 9.7 x10E3/uL (ref 3.4–10.8)

## 2023-10-13 LAB — TSH: TSH: 0.061 u[IU]/mL — ABNORMAL LOW (ref 0.450–4.500)

## 2023-10-17 ENCOUNTER — Telehealth: Payer: Self-pay

## 2023-10-17 NOTE — Telephone Encounter (Signed)
-----   Message from Nurse Carlyle C sent at 10/12/2023  2:51 PM EDT ----- Regarding: 10/16 afib ablation Precert:  MD: Cindie Type of ablation: A-fib Diagnosis: A-fib CPT code: A-fib (06343) Ablation scheduled (date/time): 10/16 at 10:00am  Procedure:  Added to calendar? Yes Orders entered? Yes Letter complete? Yes Scheduled with cath lab? Yes Any medications to hold? No Labs ordered (CBC, BMET, PT/INR if on warfarin): Yes Mapping system: Doesn't matter CARTO/[facility] rep notified? No Cardiac CT needed? No Dye allergy? No Pre-meds ordered and instructions given? No, not needed Letter method: given to patient today H&P: 9/17 Device: No  Procedure pass started  Follow-up:  Cassie/Angel, please schedule Routine.  Covering RN - please send this message to [employer], EP scheduler, EP Scheduling pool, EP [employer], and CT scheduler ([place]/Stephanie Mogg), if indicated.

## 2023-10-17 NOTE — Telephone Encounter (Signed)
Work up complete. 

## 2023-10-19 ENCOUNTER — Telehealth: Payer: Self-pay

## 2023-10-19 DIAGNOSIS — I4891 Unspecified atrial fibrillation: Secondary | ICD-10-CM | POA: Diagnosis not present

## 2023-10-19 DIAGNOSIS — Z7409 Other reduced mobility: Secondary | ICD-10-CM

## 2023-10-19 DIAGNOSIS — J45909 Unspecified asthma, uncomplicated: Secondary | ICD-10-CM | POA: Diagnosis not present

## 2023-10-19 DIAGNOSIS — R29898 Other symptoms and signs involving the musculoskeletal system: Secondary | ICD-10-CM

## 2023-10-19 DIAGNOSIS — M6259 Muscle wasting and atrophy, not elsewhere classified, multiple sites: Secondary | ICD-10-CM | POA: Diagnosis not present

## 2023-10-19 DIAGNOSIS — R2681 Unsteadiness on feet: Secondary | ICD-10-CM | POA: Diagnosis not present

## 2023-10-19 DIAGNOSIS — M62838 Other muscle spasm: Secondary | ICD-10-CM | POA: Diagnosis not present

## 2023-10-19 DIAGNOSIS — M4802 Spinal stenosis, cervical region: Secondary | ICD-10-CM | POA: Diagnosis not present

## 2023-10-19 DIAGNOSIS — M51369 Other intervertebral disc degeneration, lumbar region without mention of lumbar back pain or lower extremity pain: Secondary | ICD-10-CM | POA: Diagnosis not present

## 2023-10-19 DIAGNOSIS — K5909 Other constipation: Secondary | ICD-10-CM | POA: Diagnosis not present

## 2023-10-19 DIAGNOSIS — E789 Disorder of lipoprotein metabolism, unspecified: Secondary | ICD-10-CM | POA: Diagnosis not present

## 2023-10-19 DIAGNOSIS — Z4789 Encounter for other orthopedic aftercare: Secondary | ICD-10-CM | POA: Diagnosis not present

## 2023-10-19 DIAGNOSIS — M4712 Other spondylosis with myelopathy, cervical region: Secondary | ICD-10-CM | POA: Diagnosis not present

## 2023-10-19 DIAGNOSIS — Z87891 Personal history of nicotine dependence: Secondary | ICD-10-CM | POA: Diagnosis not present

## 2023-10-19 DIAGNOSIS — Z741 Need for assistance with personal care: Secondary | ICD-10-CM | POA: Diagnosis not present

## 2023-10-19 NOTE — Telephone Encounter (Unsigned)
 Copied from CRM [id number]. Topic: Clinical - Order For Equipment >> Oct 19, 2023  2:16 PM Charlet HERO wrote: Reason for CRM: Kate PTA with [facility] is requesting standard single point cane ordered for the patient.

## 2023-10-19 NOTE — Telephone Encounter (Unsigned)
 Copied from CRM #[id number]. Topic: Clinical - Home Health Verbal Orders >> Oct 19, 2023  2:14 PM Charlet HERO wrote: Caller/Agency: Jill/ [facility] Callback Number: [phone] Service Requested: N/A Frequency: N/A Any new concerns about the patient? Yes Kate she is stating that resting heart rate is low did walk and got back up 63, patient states that she woke up feeling nauseated.

## 2023-10-20 DIAGNOSIS — M51369 Other intervertebral disc degeneration, lumbar region without mention of lumbar back pain or lower extremity pain: Secondary | ICD-10-CM | POA: Diagnosis not present

## 2023-10-20 DIAGNOSIS — F419 Anxiety disorder, unspecified: Secondary | ICD-10-CM

## 2023-10-20 DIAGNOSIS — Z4789 Encounter for other orthopedic aftercare: Secondary | ICD-10-CM | POA: Diagnosis not present

## 2023-10-20 DIAGNOSIS — M4802 Spinal stenosis, cervical region: Secondary | ICD-10-CM | POA: Diagnosis not present

## 2023-10-20 DIAGNOSIS — M4712 Other spondylosis with myelopathy, cervical region: Secondary | ICD-10-CM | POA: Diagnosis not present

## 2023-10-20 DIAGNOSIS — I4891 Unspecified atrial fibrillation: Secondary | ICD-10-CM | POA: Diagnosis not present

## 2023-10-20 DIAGNOSIS — M62838 Other muscle spasm: Secondary | ICD-10-CM | POA: Diagnosis not present

## 2023-10-20 DIAGNOSIS — E789 Disorder of lipoprotein metabolism, unspecified: Secondary | ICD-10-CM | POA: Diagnosis not present

## 2023-10-20 DIAGNOSIS — R2681 Unsteadiness on feet: Secondary | ICD-10-CM | POA: Diagnosis not present

## 2023-10-20 DIAGNOSIS — J45909 Unspecified asthma, uncomplicated: Secondary | ICD-10-CM | POA: Diagnosis not present

## 2023-10-20 DIAGNOSIS — M6259 Muscle wasting and atrophy, not elsewhere classified, multiple sites: Secondary | ICD-10-CM | POA: Diagnosis not present

## 2023-10-20 DIAGNOSIS — F32A Depression, unspecified: Secondary | ICD-10-CM

## 2023-10-20 NOTE — Telephone Encounter (Signed)
 See other phone message. Ok to send order

## 2023-10-20 NOTE — Telephone Encounter (Signed)
 Is on amiodarone  from cardiology for rate control. HR ok in the 50s. If lower than that, should call her cardiologist. Whitney Stuart to order DME single point cane in epic per other phone message

## 2023-10-21 NOTE — Telephone Encounter (Signed)
 Order printed and placed in provider box to be signed. Also attempted to place order via parachuete

## 2023-10-21 NOTE — Telephone Encounter (Signed)
 These diagnoses for the cane (they are on the problem list) Cervical stenosis of spinal canal Decreased ambulation status Upper extremity weakness Lower extremity weakness

## 2023-10-21 NOTE — Addendum Note (Signed)
 Addended by: LILIAN SEVERO RAMAN on: 10/21/2023 12:08 PM   Modules accepted: Orders

## 2023-10-24 ENCOUNTER — Other Ambulatory Visit (HOSPITAL_COMMUNITY): Payer: Self-pay

## 2023-10-24 ENCOUNTER — Telehealth: Payer: Self-pay

## 2023-10-24 NOTE — Telephone Encounter (Signed)
 Pharmacy Patient Advocate Encounter   Received notification from Physician's Office that prior authorization for Rosuvastatin  20mg  tabs is required/requested.   Insurance verification completed.   The patient is insured through [place] .   Per test claim: The current 90 day co-pay is, $0.00.  No PA needed at this time. This test claim was processed through [facility]- copay amounts may vary at other pharmacies due to pharmacy/plan contracts, or as the patient moves through the different stages of their insurance plan.

## 2023-10-25 DIAGNOSIS — J45909 Unspecified asthma, uncomplicated: Secondary | ICD-10-CM | POA: Diagnosis not present

## 2023-10-25 DIAGNOSIS — I4891 Unspecified atrial fibrillation: Secondary | ICD-10-CM | POA: Diagnosis not present

## 2023-10-25 DIAGNOSIS — Z4789 Encounter for other orthopedic aftercare: Secondary | ICD-10-CM | POA: Diagnosis not present

## 2023-10-25 DIAGNOSIS — K5909 Other constipation: Secondary | ICD-10-CM | POA: Diagnosis not present

## 2023-10-25 DIAGNOSIS — E789 Disorder of lipoprotein metabolism, unspecified: Secondary | ICD-10-CM | POA: Diagnosis not present

## 2023-10-25 DIAGNOSIS — M62838 Other muscle spasm: Secondary | ICD-10-CM | POA: Diagnosis not present

## 2023-10-25 DIAGNOSIS — M51369 Other intervertebral disc degeneration, lumbar region without mention of lumbar back pain or lower extremity pain: Secondary | ICD-10-CM | POA: Diagnosis not present

## 2023-10-25 DIAGNOSIS — M6259 Muscle wasting and atrophy, not elsewhere classified, multiple sites: Secondary | ICD-10-CM | POA: Diagnosis not present

## 2023-10-25 DIAGNOSIS — M4712 Other spondylosis with myelopathy, cervical region: Secondary | ICD-10-CM | POA: Diagnosis not present

## 2023-10-25 DIAGNOSIS — M4802 Spinal stenosis, cervical region: Secondary | ICD-10-CM | POA: Diagnosis not present

## 2023-10-25 DIAGNOSIS — Z87891 Personal history of nicotine dependence: Secondary | ICD-10-CM | POA: Diagnosis not present

## 2023-10-25 DIAGNOSIS — Z741 Need for assistance with personal care: Secondary | ICD-10-CM | POA: Diagnosis not present

## 2023-10-25 DIAGNOSIS — R2681 Unsteadiness on feet: Secondary | ICD-10-CM | POA: Diagnosis not present

## 2023-10-25 NOTE — Progress Notes (Addendum)
   REFERRING PHYSICIAN:  Myrla Jon HERO, Md [address] [place],  [place] [address]  DOS: 07/16/23  ACDF C3-C6 with corpectomy C4-C5, posterior cervical fusion C3-C6  HISTORY OF PRESENT ILLNESS:  She was doing well at her last visit with minimal pain.   She is not having any pain in her neck. She has more constant numbness and tingling in her fingers. She feels like it is improving. No issues swallowing. Voice is good.   She is doing PT and getting ready to be released.    PHYSICAL EXAMINATION:  NEUROLOGICAL:  General: In no acute distress.   Awake, alert, oriented to person, place, and time.  Pupils equal round and reactive to light.  Facial tone is symmetric.    Strength: Side Biceps Triceps Deltoid Interossei Grip Wrist Ext. Wrist Flex.  R 5 5 5 5 5 5 5   L 5 5 5 5 5 5 5    Incisions well healed.   Imaging:  Cervical xrays dated 10/27/23:  No complications noted.  Report for above xrays not yet available.   Assessment / Plan: HONESTEE REVARD is doing well s/p above surgery. Treatment options reviewed with patient and following plan made:   - She can slowly return to activity as tolerated.  - Finish out PT and continue with HEP.  - Follow up with Dr. Sharps in 6 months and prn.   Advised to contact the office if any questions or concerns arise.   ADDENDUM 10/31/23:  Xrays reviewed with Dr. Sharps and he agrees with above plan.   Glade Boys PA-C Dept of Neurosurgery

## 2023-10-25 NOTE — Telephone Encounter (Signed)
 Generic is fine, not sure why pharmacy is sending PA for brand

## 2023-10-27 ENCOUNTER — Ambulatory Visit
Admission: RE | Admit: 2023-10-27 | Discharge: 2023-10-27 | Disposition: A | Discharge status: Home / Self Care | Source: Ambulatory Visit | Attending: Orthopedic Surgery | Admitting: Orthopedic Surgery

## 2023-10-27 ENCOUNTER — Encounter: Payer: Self-pay | Admitting: Orthopedic Surgery

## 2023-10-27 ENCOUNTER — Ambulatory Visit
Admission: RE | Admit: 2023-10-27 | Discharge: 2023-10-27 | Disposition: A | Discharge status: Home / Self Care | Attending: Orthopedic Surgery | Admitting: Orthopedic Surgery

## 2023-10-27 ENCOUNTER — Ambulatory Visit (INDEPENDENT_AMBULATORY_CARE_PROVIDER_SITE_OTHER): Admitting: Orthopedic Surgery

## 2023-10-27 VITALS — BP 122/82 | Wt 169.6 lb

## 2023-10-27 DIAGNOSIS — Z981 Arthrodesis status: Secondary | ICD-10-CM | POA: Diagnosis not present

## 2023-10-27 DIAGNOSIS — G959 Disease of spinal cord, unspecified: Secondary | ICD-10-CM

## 2023-10-27 DIAGNOSIS — Z4789 Encounter for other orthopedic aftercare: Secondary | ICD-10-CM | POA: Diagnosis not present

## 2023-10-27 DIAGNOSIS — M4802 Spinal stenosis, cervical region: Secondary | ICD-10-CM | POA: Diagnosis not present

## 2023-10-30 ENCOUNTER — Other Ambulatory Visit: Payer: Self-pay | Admitting: Family Medicine

## 2023-11-01 ENCOUNTER — Telehealth: Payer: Self-pay

## 2023-11-01 NOTE — Telephone Encounter (Signed)
 Copied from CRM #[id number]. Topic: General - Other >> Oct 31, 2023 11:30 AM Carlatta H wrote: Reason for CRM: Facility is requesting face to face notes faxed to [phone]

## 2023-11-01 NOTE — Telephone Encounter (Signed)
 No, but ok to send last OV note

## 2023-11-01 NOTE — Telephone Encounter (Signed)
 Faxed last OV note from 09/15/23 to [phone] on 11/01/23.

## 2023-11-03 ENCOUNTER — Telehealth: Payer: Self-pay

## 2023-11-03 NOTE — Telephone Encounter (Signed)
 Copied from CRM [id number]. Topic: General - Other >> Nov 02, 2023  3:56 PM Myrick T wrote: Reason for CRM: Verneita [phone] [employer] [facility] [employer] called to have the face to face notes giving reason for the single point cane. Please fax notes to [phone].

## 2023-11-03 NOTE — Telephone Encounter (Signed)
 OV note faxed as requested.

## 2023-11-03 NOTE — Telephone Encounter (Signed)
 Send OV from 08/16/23

## 2023-11-04 NOTE — Telephone Encounter (Unsigned)
 Copied from CRM [id number]. Topic: Clinical - Order For Equipment >> Nov 03, 2023  4:50 PM Nathanel BROCKS wrote: Reason for CRM: [facility], [phone] and you received an order for a single point cane and need notes to support request for insurance. Please send this over to fax: [phone]. Asap, please advise The notes that was sent was for a walker and not a cane. If cane is needed send this information to comply with that.

## 2023-11-07 NOTE — Telephone Encounter (Signed)
 Going to need to do a F2F (virtual or in person) to document that she has now progreesed to single point cane as she was using walker at last visit. Medicare requires F2F documentation to over the DME.

## 2023-11-07 NOTE — Telephone Encounter (Signed)
 Delon called today to check status of the same request please advise.

## 2023-11-08 NOTE — Telephone Encounter (Signed)
 Virtual appt made 11/08/23 at 1:00

## 2023-11-09 DIAGNOSIS — M62838 Other muscle spasm: Secondary | ICD-10-CM | POA: Diagnosis not present

## 2023-11-09 DIAGNOSIS — I4891 Unspecified atrial fibrillation: Secondary | ICD-10-CM | POA: Diagnosis not present

## 2023-11-09 DIAGNOSIS — M4802 Spinal stenosis, cervical region: Secondary | ICD-10-CM | POA: Diagnosis not present

## 2023-11-09 DIAGNOSIS — Z87891 Personal history of nicotine dependence: Secondary | ICD-10-CM | POA: Diagnosis not present

## 2023-11-09 DIAGNOSIS — Z741 Need for assistance with personal care: Secondary | ICD-10-CM | POA: Diagnosis not present

## 2023-11-09 DIAGNOSIS — M6259 Muscle wasting and atrophy, not elsewhere classified, multiple sites: Secondary | ICD-10-CM | POA: Diagnosis not present

## 2023-11-09 DIAGNOSIS — Z4789 Encounter for other orthopedic aftercare: Secondary | ICD-10-CM | POA: Diagnosis not present

## 2023-11-09 DIAGNOSIS — E789 Disorder of lipoprotein metabolism, unspecified: Secondary | ICD-10-CM | POA: Diagnosis not present

## 2023-11-09 DIAGNOSIS — R2681 Unsteadiness on feet: Secondary | ICD-10-CM | POA: Diagnosis not present

## 2023-11-09 DIAGNOSIS — K5909 Other constipation: Secondary | ICD-10-CM | POA: Diagnosis not present

## 2023-11-09 DIAGNOSIS — J45909 Unspecified asthma, uncomplicated: Secondary | ICD-10-CM | POA: Diagnosis not present

## 2023-11-09 DIAGNOSIS — M4712 Other spondylosis with myelopathy, cervical region: Secondary | ICD-10-CM | POA: Diagnosis not present

## 2023-11-09 DIAGNOSIS — M51369 Other intervertebral disc degeneration, lumbar region without mention of lumbar back pain or lower extremity pain: Secondary | ICD-10-CM | POA: Diagnosis not present

## 2023-11-09 NOTE — Pre-Procedure Instructions (Signed)
 Instructed patient on the following items: Arrival time 0730 Nothing to eat or drink after midnight No meds AM of procedure Responsible person to drive you home and stay with you for 24 hrs  Have you missed any doses of anti-coagulant Eliquis - takes twice a day, hasn't missed any dose in last 4 weeks.  Don't take dose morning of procedure.

## 2023-11-10 ENCOUNTER — Encounter (HOSPITAL_COMMUNITY): Payer: Self-pay | Admitting: Cardiology

## 2023-11-10 ENCOUNTER — Other Ambulatory Visit: Payer: Self-pay

## 2023-11-10 ENCOUNTER — Encounter (HOSPITAL_COMMUNITY)
Admission: RE | Disposition: A | Discharge status: Home / Self Care | Payer: Self-pay | Source: Home / Self Care | Attending: Cardiology

## 2023-11-10 ENCOUNTER — Ambulatory Visit (HOSPITAL_COMMUNITY)

## 2023-11-10 ENCOUNTER — Ambulatory Visit (HOSPITAL_COMMUNITY)
Admission: RE | Admit: 2023-11-10 | Discharge: 2023-11-10 | Disposition: A | Discharge status: Home / Self Care | Attending: Cardiology | Admitting: Cardiology

## 2023-11-10 ENCOUNTER — Ambulatory Visit (HOSPITAL_BASED_OUTPATIENT_CLINIC_OR_DEPARTMENT_OTHER)

## 2023-11-10 ENCOUNTER — Other Ambulatory Visit (HOSPITAL_COMMUNITY): Payer: Self-pay

## 2023-11-10 DIAGNOSIS — I10 Essential (primary) hypertension: Secondary | ICD-10-CM | POA: Diagnosis not present

## 2023-11-10 DIAGNOSIS — Z7722 Contact with and (suspected) exposure to environmental tobacco smoke (acute) (chronic): Secondary | ICD-10-CM | POA: Diagnosis not present

## 2023-11-10 DIAGNOSIS — Z7901 Long term (current) use of anticoagulants: Secondary | ICD-10-CM | POA: Diagnosis not present

## 2023-11-10 DIAGNOSIS — Z79899 Other long term (current) drug therapy: Secondary | ICD-10-CM | POA: Diagnosis not present

## 2023-11-10 DIAGNOSIS — F419 Anxiety disorder, unspecified: Secondary | ICD-10-CM | POA: Diagnosis not present

## 2023-11-10 DIAGNOSIS — E785 Hyperlipidemia, unspecified: Secondary | ICD-10-CM | POA: Insufficient documentation

## 2023-11-10 DIAGNOSIS — I739 Peripheral vascular disease, unspecified: Secondary | ICD-10-CM | POA: Diagnosis not present

## 2023-11-10 DIAGNOSIS — I4819 Other persistent atrial fibrillation: Secondary | ICD-10-CM | POA: Insufficient documentation

## 2023-11-10 DIAGNOSIS — Z87891 Personal history of nicotine dependence: Secondary | ICD-10-CM

## 2023-11-10 DIAGNOSIS — I4891 Unspecified atrial fibrillation: Secondary | ICD-10-CM

## 2023-11-10 DIAGNOSIS — I129 Hypertensive chronic kidney disease with stage 1 through stage 4 chronic kidney disease, or unspecified chronic kidney disease: Secondary | ICD-10-CM | POA: Diagnosis not present

## 2023-11-10 DIAGNOSIS — N1831 Chronic kidney disease, stage 3a: Secondary | ICD-10-CM | POA: Diagnosis not present

## 2023-11-10 HISTORY — PX: ATRIAL FIBRILLATION ABLATION: EP1191

## 2023-11-10 LAB — POCT ACTIVATED CLOTTING TIME: Activated Clotting Time: 285 s

## 2023-11-10 MED ORDER — PROTAMINE SULFATE 10 MG/ML IV SOLN
INTRAVENOUS | Status: DC | PRN
  Administered 2023-11-10: 35 mg via INTRAVENOUS

## 2023-11-10 MED ORDER — ONDANSETRON HCL 4 MG/2ML IJ SOLN
INTRAMUSCULAR | Status: DC | PRN
  Administered 2023-11-10: 4 mg via INTRAVENOUS

## 2023-11-10 MED ORDER — ONDANSETRON HCL 4 MG/2ML IJ SOLN: 4.0000 mg | Freq: Four times a day (QID) | INTRAMUSCULAR | Status: DC | PRN

## 2023-11-10 MED ORDER — ACETAMINOPHEN 325 MG PO TABS
650.0000 mg | ORAL_TABLET | ORAL | Status: DC | PRN
  Administered 2023-11-10: 650 mg via ORAL

## 2023-11-10 MED ORDER — COLCHICINE 0.6 MG PO TABS
0.6000 mg | ORAL_TABLET | Freq: Two times a day (BID) | ORAL | Status: DC
  Administered 2023-11-10: 0.6 mg via ORAL
  Filled 2023-11-10: qty 1

## 2023-11-10 MED ORDER — ACETAMINOPHEN 325 MG PO TABS
ORAL_TABLET | ORAL | Status: AC
  Filled 2023-11-10: qty 2

## 2023-11-10 MED ORDER — HEPARIN SODIUM (PORCINE) 1000 UNIT/ML IJ SOLN
INTRAMUSCULAR | Status: DC | PRN
  Administered 2023-11-10: 13000 [IU] via INTRAVENOUS

## 2023-11-10 MED ORDER — HEPARIN (PORCINE) IN NACL 1000-0.9 UT/500ML-% IV SOLN
INTRAVENOUS | Status: DC | PRN
Start: 2023-11-10 — End: 2023-11-10
  Administered 2023-11-10 (×3): 500 mL

## 2023-11-10 MED ORDER — COLCHICINE 0.6 MG PO TABS
0.6000 mg | ORAL_TABLET | Freq: Two times a day (BID) | ORAL | 0 refills | Status: DC
  Filled 2023-11-10: qty 10, 5d supply, fill #0

## 2023-11-10 MED ORDER — DEXAMETHASONE SOD PHOSPHATE PF 10 MG/ML IJ SOLN
INTRAMUSCULAR | Status: DC | PRN
  Administered 2023-11-10: 4 mg via INTRAVENOUS

## 2023-11-10 MED ORDER — SODIUM CHLORIDE 0.9% FLUSH: 3.0000 mL | Freq: Two times a day (BID) | INTRAVENOUS | Status: DC

## 2023-11-10 MED ORDER — SODIUM CHLORIDE 0.9% FLUSH: 3.0000 mL | INTRAVENOUS | Status: DC | PRN

## 2023-11-10 MED ORDER — PANTOPRAZOLE SODIUM 40 MG PO TBEC
40.0000 mg | DELAYED_RELEASE_TABLET | Freq: Every day | ORAL | Status: DC
  Administered 2023-11-10: 40 mg via ORAL
  Filled 2023-11-10: qty 1

## 2023-11-10 MED ORDER — APIXABAN 5 MG PO TABS
5.0000 mg | ORAL_TABLET | Freq: Two times a day (BID) | ORAL | Status: DC
  Administered 2023-11-10: 5 mg via ORAL
  Filled 2023-11-10: qty 1

## 2023-11-10 MED ORDER — ATROPINE SULFATE 1 MG/10ML IJ SOSY
PREFILLED_SYRINGE | INTRAMUSCULAR | Status: DC | PRN
  Administered 2023-11-10: 1 mg via INTRAVENOUS

## 2023-11-10 MED ORDER — LIDOCAINE 2% (20 MG/ML) 5 ML SYRINGE
INTRAMUSCULAR | Status: DC | PRN
  Administered 2023-11-10: 100 mg via INTRAVENOUS

## 2023-11-10 MED ORDER — PHENYLEPHRINE HCL-NACL 20-0.9 MG/250ML-% IV SOLN
INTRAVENOUS | Status: DC | PRN
  Administered 2023-11-10: 50 ug/min via INTRAVENOUS

## 2023-11-10 MED ORDER — PROPOFOL 10 MG/ML IV BOLUS
INTRAVENOUS | Status: DC | PRN
Start: 2023-11-10 — End: 2023-11-10
  Administered 2023-11-10: 150 mg via INTRAVENOUS

## 2023-11-10 MED ORDER — SODIUM CHLORIDE 0.9 % IV SOLN: 250.0000 mL | INTRAVENOUS | Status: DC | PRN

## 2023-11-10 MED ORDER — SODIUM CHLORIDE 0.9 % IV SOLN: INTRAVENOUS | Status: DC

## 2023-11-10 MED ORDER — SUGAMMADEX SODIUM 200 MG/2ML IV SOLN
INTRAVENOUS | Status: DC | PRN
  Administered 2023-11-10: 200 mg via INTRAVENOUS

## 2023-11-10 MED ORDER — ROCURONIUM BROMIDE 10 MG/ML (PF) SYRINGE
PREFILLED_SYRINGE | INTRAVENOUS | Status: DC | PRN
  Administered 2023-11-10: 70 mg via INTRAVENOUS

## 2023-11-10 MED ORDER — PANTOPRAZOLE SODIUM 40 MG PO TBEC
40.0000 mg | DELAYED_RELEASE_TABLET | Freq: Every day | ORAL | 0 refills | Status: DC
  Filled 2023-11-10: qty 45, 45d supply, fill #0

## 2023-11-10 NOTE — Anesthesia Procedure Notes (Signed)
 Procedure Name: Intubation Date/Time: 11/10/2023 9:47 AM  Performed by: Lockie Flesher, CRNAPre-anesthesia Checklist: Patient identified, Emergency Drugs available, Suction available and Patient being monitored Patient Re-evaluated:Patient Re-evaluated prior to induction Oxygen Delivery Method: Circle System Utilized Preoxygenation: Pre-oxygenation with 100% oxygen Induction Type: IV induction Ventilation: Mask ventilation without difficulty Laryngoscope Size: Mac and 3 Grade View: Grade I Tube type: Oral Tube size: 7.5 mm Number of attempts: 1 Airway Equipment and Method: Stylet and Oral airway Placement Confirmation: ETT inserted through vocal cords under direct vision, positive ETCO2 and breath sounds checked- equal and bilateral Secured at: 22 cm Tube secured with: Tape Dental Injury: Teeth and Oropharynx as per pre-operative assessment

## 2023-11-10 NOTE — Progress Notes (Signed)
 This nurse informed Dr. Cindie of Patient's EKG which showed Junctional rhythm. Dr. Cindie stated he was aware as this was patient's baseline. No new orders at this time.

## 2023-11-10 NOTE — Anesthesia Preprocedure Evaluation (Addendum)
 Anesthesia Evaluation  Patient identified by MRN, date of birth, ID band Patient awake    Reviewed: Allergy & Precautions, NPO status , Patient's Chart, lab work & pertinent test results  History of Anesthesia Complications Negative for: history of anesthetic complications  Airway Mallampati: I  TM Distance: >3 FB Neck ROM: Full    Dental  (+) Teeth Intact, Dental Advisory Given   Pulmonary asthma , former smoker   breath sounds clear to auscultation       Cardiovascular hypertension, Pt. on medications + Peripheral Vascular Disease  + dysrhythmias Atrial Fibrillation + Valvular Problems/Murmurs MR  Rhythm:Irregular Rate:Normal  TTE (2024)  1. Left ventricular ejection fraction, by estimation, is 60 to 65%. The  left ventricle has normal function. The left ventricle has no regional  wall motion abnormalities. There is mild asymmetric left ventricular  hypertrophy of the basal-septal segment.  Left ventricular diastolic parameters are indeterminate. The average left  ventricular global longitudinal strain is -10.2 %.   2. Right ventricular systolic function is normal. The right ventricular  size is normal. There is mildly elevated pulmonary artery systolic  pressure. The estimated right ventricular systolic pressure is 43.9 mmHg.   3. Left atrial size was severely dilated.   4. The mitral valve is normal in structure. Moderate mitral valve  regurgitation. No evidence of mitral stenosis.   5. The aortic valve is tricuspid. Aortic valve regurgitation is not  visualized. No aortic stenosis is present.   6. The inferior vena cava is normal in size with greater than 50%  respiratory variability, suggesting right atrial pressure of 3 mmHg.     Neuro/Psych  PSYCHIATRIC DISORDERS Anxiety        GI/Hepatic   Endo/Other    Renal/GU      Musculoskeletal  (+) Arthritis ,    Abdominal   Peds  Hematology   Anesthesia Other  Findings Chronic, non-productive cough following ACDF  Reproductive/Obstetrics                              Anesthesia Physical Anesthesia Plan  ASA: 3  Anesthesia Plan: General   Post-op Pain Management:    Induction: Intravenous  PONV Risk Score and Plan: 1 and Ondansetron , Dexamethasone  and Treatment may vary due to age or medical condition  Airway Management Planned: Oral ETT  Additional Equipment:   Intra-op Plan:   Post-operative Plan: Extubation in OR  Informed Consent:      Dental advisory given  Plan Discussed with: CRNA and Surgeon  Anesthesia Plan Comments:          Anesthesia Quick Evaluation

## 2023-11-10 NOTE — Anesthesia Postprocedure Evaluation (Signed)
 Anesthesia Post Note  Patient: Whitney Stuart  Procedure(s) Performed: ATRIAL FIBRILLATION ABLATION     Patient location during evaluation: PACU Anesthesia Type: General Level of consciousness: awake and alert Pain management: pain level controlled Vital Signs Assessment: post-procedure vital signs reviewed and stable Respiratory status: spontaneous breathing, nonlabored ventilation, respiratory function stable and patient connected to nasal cannula oxygen Cardiovascular status: blood pressure returned to baseline and stable Postop Assessment: no apparent nausea or vomiting Anesthetic complications: no   No notable events documented.  Last Vitals:  Vitals:   11/10/23 1330 11/10/23 1400  BP: (!) 160/66 (!) 170/70  Pulse: (!) 52 (!) 52  Resp: 20 12  Temp:    SpO2: 98% 98%    Last Pain:  Vitals:   11/10/23 1159  TempSrc:   PainSc: 5    Pain Goal: Patients Stated Pain Goal: 5 (11/10/23 0819)                 Zellie Jenning

## 2023-11-10 NOTE — Discharge Instructions (Signed)

## 2023-11-10 NOTE — Interval H&P Note (Signed)
 History and Physical Interval Note:  11/10/2023 9:20 AM  Whitney Stuart  has presented today for surgery, with the diagnosis of afib.  The various methods of treatment have been discussed with the patient and family. After consideration of risks, benefits and other options for treatment, the patient has consented to  Procedure(s): ATRIAL FIBRILLATION ABLATION (N/A) as a surgical intervention.  The patient's history has been reviewed, patient examined, no change in status, stable for surgery.  I have reviewed the patient's chart and labs.  Questions were answered to the patient's satisfaction.     Serenidy Waltz T Josslyn Ciolek

## 2023-11-10 NOTE — Transfer of Care (Signed)
 Immediate Anesthesia Transfer of Care Note  Patient: Whitney Stuart  Procedure(s) Performed: ATRIAL FIBRILLATION ABLATION  Patient Location: PACU  Anesthesia Type:General  Level of Consciousness: awake, alert , and oriented  Airway & Oxygen Therapy: Patient Spontanous Breathing and Patient connected to nasal cannula oxygen  Post-op Assessment: Report given to RN and Post -op Vital signs reviewed and stable  Post vital signs: Reviewed and stable  Last Vitals:  Vitals Value Taken Time  BP    Temp    Pulse 52 11/10/23 11:06  Resp 20 11/10/23 11:06  SpO2 96 % 11/10/23 11:06  Vitals shown include unfiled device data.  Last Pain:  Vitals:   11/10/23 0819  TempSrc:   PainSc: 0-No pain      Patients Stated Pain Goal: 5 (11/10/23 0819)  Complications: No notable events documented.

## 2023-11-11 ENCOUNTER — Encounter: Payer: Self-pay | Admitting: Emergency Medicine

## 2023-11-11 ENCOUNTER — Telehealth (HOSPITAL_COMMUNITY): Payer: Self-pay

## 2023-11-11 NOTE — Telephone Encounter (Signed)
 Attempted to reach patient to follow up with procedure completed on 11/10/23, no answer. Left VM for patient to return call.

## 2023-11-12 ENCOUNTER — Encounter: Payer: Self-pay | Admitting: Cardiology

## 2023-11-15 ENCOUNTER — Other Ambulatory Visit: Payer: Self-pay | Admitting: Family Medicine

## 2023-11-15 ENCOUNTER — Telehealth: Admitting: Family Medicine

## 2023-11-15 DIAGNOSIS — J4531 Mild persistent asthma with (acute) exacerbation: Secondary | ICD-10-CM

## 2023-11-15 DIAGNOSIS — E782 Mixed hyperlipidemia: Secondary | ICD-10-CM

## 2023-11-15 DIAGNOSIS — G959 Disease of spinal cord, unspecified: Secondary | ICD-10-CM

## 2023-11-15 DIAGNOSIS — Z7409 Other reduced mobility: Secondary | ICD-10-CM | POA: Diagnosis not present

## 2023-11-15 MED ORDER — ROSUVASTATIN CALCIUM 20 MG PO TABS: 20.0000 mg | ORAL_TABLET | Freq: Every day | ORAL | 1 refills | Status: AC

## 2023-11-15 NOTE — Progress Notes (Addendum)
 MyChart Video Visit    Virtual Visit via Video Note   This format is felt to be most appropriate for this patient at this time. Physical exam was limited by quality of the video and audio technology used for the visit.    Patient location: home Provider location: [facility] Persons involved in the visit: patient, provider  I discussed the limitations of evaluation and management by telemedicine and the availability of in person appointments. The patient expressed understanding and agreed to proceed.  Patient: Whitney Stuart   DOB: 1950-02-03   73 y.o. Female  MRN: [id number] Visit Date: 11/15/2023  Today's healthcare provider: Jon Eva, MD   No chief complaint on file.  Subjective    HPI   Discussed the use of AI scribe software for clinical note transcription with the patient, who gave verbal consent to proceed.  History of Present Illness   Whitney Stuart is a 73 year old female who presents for a follow-up regarding mobility aids and medication management.  She is transitioning from using a walker to a cane for short distances within the house. Her daughter purchased a cane from [employer], which she finds adequate for her needs. She continues to use the walker most of the time, especially for longer distances.  There is confusion regarding her rosuvastatin  dosage. Her current medication list shows both 10 mg and 20 mg, but she recalls being on 15 mg when her cholesterol was last checked in August.  She has an issue with her inhaler prescription, which was returned to the pharmacy as she did not pick it up in time. She is currently using Trelegy and albuterol , with a year's supply ordered and several refills available.       Review of Systems      Objective    There were no vitals taken for this visit.     Physical Exam Constitutional:      General: She is not in acute distress.    Appearance: Normal appearance.  HENT:     Head:  Normocephalic.  Pulmonary:     Effort: Pulmonary effort is normal. No respiratory distress.  Neurological:     Mental Status: She is alert and oriented to person, place, and time. Mental status is at baseline.        Assessment & Plan     Problem List Items Addressed This Visit       Respiratory   Asthma, chronic     Nervous and Auditory   Cervical myelopathy (HCC)     Other   Hyperlipidemia, unspecified   Relevant Medications   rosuvastatin  (CRESTOR ) 20 MG tablet   Other Visit Diagnoses       Mobility impaired    -  Primary           Mobility impairment requiring walker and cane Mobility impairment persists, requiring the use of a walker and occasional use of a cane for short distances. Medicare requires documentation for cane prescription due to previous use of a walker. - Documented transition to cane use for short distances in the house. - Ensured cane prescription is available if needed in the future. - Requires single point cane for mobility around her home.  Had cervical myelopathy and is healing after surgery. She is able to use it safely and accommodate it in her home.    Asthma  management includes Trelegy and albuterol  inhalers. Prescription refills are available at the pharmacy. - Ensured Trelegy and  albuterol  inhalers are available at the pharmacy.  Hypercholesterolemia Management is complicated by conflicting medication dosages. Previous cholesterol panel in August showed good levels while on 15 mg rosuvastatin . Current plan is to use 20 mg rosuvastatin  to ensure adequate cholesterol control. - Continue rosuvastatin  20 mg daily. - Will recheck cholesterol levels at next visit to assess efficacy of current dosage.         Meds ordered this encounter  Medications   rosuvastatin  (CRESTOR ) 20 MG tablet    Sig: Take 1 tablet (20 mg total) by mouth daily.    Dispense:  90 tablet    Refill:  1     No follow-ups on file.     I discussed the  assessment and treatment plan with the patient. The patient was provided an opportunity to ask questions and all were answered. The patient agreed with the plan and demonstrated an understanding of the instructions.   The patient was advised to call back or seek an in-person evaluation if the symptoms worsen or if the condition fails to improve as anticipated.   Jon Eva, MD [facility] [phone] (phone) [phone] (fax)  [facility]

## 2023-11-17 NOTE — Telephone Encounter (Signed)
 Requested Prescriptions  Refused Prescriptions Disp Refills   lisinopril  (ZESTRIL ) 20 MG tablet [Pharmacy Med Name: Lisinopril  20 MG Oral Tablet] 100 tablet 2    Sig: TAKE 1 TABLET BY MOUTH DAILY     Cardiovascular:  ACE Inhibitors Failed - 11/17/2023  3:06 PM      Failed - Cr in normal range and within 180 days    Creatinine, Ser  Date Value Ref Range Status  10/12/2023 1.24 (H) 0.57 - 1.00 mg/dL Final         Failed - Last BP in normal range    BP Readings from Last 1 Encounters:  11/10/23 (!) 170/70         Passed - K in normal range and within 180 days    Potassium  Date Value Ref Range Status  10/12/2023 4.9 3.5 - 5.2 mmol/L Final         Passed - Patient is not pregnant      Passed - Valid encounter within last 6 months    Recent Outpatient Visits           2 days ago Mobility impaired   [facility] [place], Jon HERO, MD   2 months ago Essential (primary) hypertension   [facility] [place], Jon HERO, MD   2 months ago Mild persistent chronic asthma with acute exacerbation   [facility] [place], Jon HERO, MD   3 months ago Cervical myelopathy [facility])   [facility] [place], Jon HERO, MD   4 months ago NSTEMI (non-ST elevated myocardial infarction) [facility])   [facility] [place], Jon HERO, MD       Future Appointments             In 3 weeks Riddle, Suzann, NP [facility] [place]   In 2 months Riddle, Suzann, NP [facility]             sertraline  (ZOLOFT ) 100 MG tablet [Pharmacy Med Name: Sertraline  HCl 100 MG Oral Tablet] 90 tablet 3    Sig: TAKE 1 TABLET BY MOUTH DAILY     Psychiatry:  Antidepressants - SSRI - sertraline  Passed - 11/17/2023  3:06 PM      Passed - AST in normal range and within 360 days    AST  Date Value Ref Range Status  10/12/2023 12 0 - 40 IU/L Final          Passed - ALT in normal range and within 360 days    ALT  Date Value Ref Range Status  10/12/2023 15 0 - 32 IU/L Final         Passed - Completed PHQ-2 or PHQ-9 in the last 360 days      Passed - Valid encounter within last 6 months    Recent Outpatient Visits           2 days ago Mobility impaired   [facility] [place], Jon HERO, MD   2 months ago Essential (primary) hypertension   [facility] [place], Jon HERO, MD   2 months ago Mild persistent chronic asthma with acute exacerbation   [facility] [place], Jon HERO, MD   3 months ago Cervical myelopathy [facility])   [facility] [place], Jon HERO, MD   4 months ago NSTEMI (non-ST elevated myocardial infarction) [facility])   [place]  [facility] [place], Jon HERO, MD       Future Appointments             In 3 weeks Riddle, Suzann, NP [facility] [place]   In 2 months Riddle, Suzann, NP [facility]

## 2023-11-22 ENCOUNTER — Other Ambulatory Visit: Payer: Self-pay | Admitting: Family Medicine

## 2023-11-22 DIAGNOSIS — J4520 Mild intermittent asthma, uncomplicated: Secondary | ICD-10-CM

## 2023-11-25 ENCOUNTER — Encounter: Payer: Self-pay | Admitting: Emergency Medicine

## 2023-11-30 ENCOUNTER — Other Ambulatory Visit: Payer: Self-pay | Admitting: Cardiovascular Disease

## 2023-11-30 ENCOUNTER — Telehealth: Payer: Self-pay

## 2023-11-30 NOTE — Telephone Encounter (Signed)
 Copied from CRM #[id number]. Topic: Clinical - Order For Equipment >> Nov 30, 2023  9:21 AM Myrick T wrote: Reason for CRM: Verneita from [facility] called stating they need face to face medical notes within the last for insurance purposes. If the notes are after the order date 9/29 then there need to be a new order with the notes. They can be faxed to [facility] at [phone].

## 2023-11-30 NOTE — Telephone Encounter (Signed)
 Yeah we'll send new order at the visit.

## 2023-11-30 NOTE — Telephone Encounter (Signed)
 If call is returned please advise patient a face to face is needed so that we can have order placed for her equipment. She will have to be seen in office as it can not be done via telehealth per her insurance.

## 2023-12-05 ENCOUNTER — Telehealth: Payer: Self-pay | Admitting: Family Medicine

## 2023-12-05 NOTE — Telephone Encounter (Signed)
 Copied from CRM #[id number]. Topic: General - Other >> Dec 02, 2023  4:16 PM Zebedee SAUNDERS wrote: Reason for CRM: Pt wants to know if 12/26/2023 OV with Dr. Myrla is early enough to be seen for the insurance? Please call pt at [phone].

## 2023-12-06 NOTE — Telephone Encounter (Signed)
 Noted

## 2023-12-07 ENCOUNTER — Telehealth: Payer: Self-pay

## 2023-12-07 NOTE — Telephone Encounter (Signed)
 Copied from CRM [id number]. Topic: General - Other >> Dec 06, 2023  4:09 PM Wess RAMAN wrote: Reason for CRM: [employer] called to let Dr. Myrla know that the order for the single point cane will be closed because they did not receive the office visit notes.  Callback #: [phone]

## 2023-12-08 NOTE — Telephone Encounter (Signed)
 We needed in person F2F to qualify for documentation after virtual visits were stopped for Medicare.  Patient stated that she no longer needed the cane.

## 2023-12-08 NOTE — Progress Notes (Unsigned)
 Electrophysiology Clinic Note    Date:  12/09/2023  Patient ID:  Whitney Stuart 05/03/1950, MRN [id number] PCP:  Myrla Jon HERO, MD  Cardiologist:  Deatrice Cage, MD  Cardiology APP:  Vivienne Lonni Ingle, NP  Electrophysiologist:  OLE ONEIDA HOLTS, MD  Electrophysiology APP:  Isauro Skelley, NP    Discussed the use of AI scribe software for clinical note transcription with the patient, who gave verbal consent to proceed.   Patient Profile    Chief Complaint: AF ablation follow-up  History of Present Illness: Whitney Stuart is a 73 y.o. female with PMH notable for persis AFib, HTN, HLD, pulm HTN, asthma, CKD; seen today for OLE ONEIDA HOLTS, MD for routine electrophysiology follow-up s/p AF Ablation.  She is s/p AF ablation w isolation of pulm veins, posterior wall on 10/16 by Dr. Holts.   On follow-up today, she is having rare episodes of her heart beating hard in her chest, happens every 1-2 weeks for a brief period of time. She wears fitbit watch and during these episodes her heart rate is at her baseline 50s. Watch is not able to capture EKG. She continues to take eliquis  BID, no bleeding concerns.  Bilateral groin access sites are completely healed without tenderness, pain, swelling, or bruising.   She checks her BP at home and it is quite labile, getting readings from 120-160 systolic.     Arrhythmia/Device History Amiodarone  - stopped 09/2023    ROS:  Please see the history of present illness. All other systems are reviewed and otherwise negative.    Physical Exam    VS:  BP 118/68   Pulse (!) 54   Ht 5' 6 (1.676 m)   Wt 172 lb (78 kg)   SpO2 97%   BMI 27.76 kg/m  BMI: Body mass index is 27.76 kg/m.           Wt Readings from Last 3 Encounters:  12/09/23 172 lb (78 kg)  11/10/23 170 lb (77.1 kg)  10/27/23 169 lb 9.6 oz (76.9 kg)     GEN- The patient is well appearing, alert and oriented x 3 today.   Lungs- Clear to  ausculation bilaterally, normal work of breathing.  Heart- Regular rate and rhythm, no murmurs, rubs or gallops Extremities- No peripheral edema, warm, dry   Studies Reviewed   Previous EP, cardiology notes.    EKG is ordered. Personal review of EKG from today shows:    EKG Interpretation Date/Time:  Friday December 09 2023 10:34:23 EST Ventricular Rate:  54 PR Interval:  158 QRS Duration:  86 QT Interval:  506 QTC Calculation: 479 R Axis:   60  Text Interpretation: Sinus bradycardia Confirmed by Osher Oettinger [phone]) on 12/09/2023 10:38:12 AM    LHC, 06/30/2023   The left ventricular systolic function is normal.   LV end diastolic pressure is normal.   The left ventricular ejection fraction is 55-65% by visual estimate.   1.  Normal coronary arteries. 2.  Normal LV systolic function normal left ventricular end-diastolic pressure.  Long term monitor, 07/12/2022 Patch Wear Time:  6 days and 20 hours (2024-06-02T23:27:32-0400 to 2024-06-09T19:57:07-398)   Patient had a min HR of 44 bpm, max HR of 154 bpm, and avg HR of 66 bpm. Predominant underlying rhythm was Sinus Rhythm.  2 Supraventricular Tachycardia runs occurred, the run with the fastest interval lasting 20 beats with a max rate of 154 bpm (avg 114 bpm); the run with the fastest  interval was also the longest.  Rare PACs and rare PVCs.  TTE, 06/01/2022  1. Left ventricular ejection fraction, by estimation, is 60 to 65%. The left ventricle has normal function. The left ventricle has no regional wall motion abnormalities. There is mild asymmetric left ventricular hypertrophy of the basal-septal segment. Left ventricular diastolic parameters are indeterminate. The average left ventricular global longitudinal strain is -10.2 %.   2. Right ventricular systolic function is normal. The right ventricular size is normal. There is mildly elevated pulmonary artery systolic pressure. The estimated right ventricular systolic pressure is 43.9  mmHg.   3. Left atrial size was severely dilated.   4. The mitral valve is normal in structure. Moderate mitral valve regurgitation. No evidence of mitral stenosis.   5. The aortic valve is tricuspid. Aortic valve regurgitation is not visualized. No aortic stenosis is present.   6. The inferior vena cava is normal in size with greater than 50% respiratory variability, suggesting right atrial pressure of 3 mmHg.    Assessment and Plan     #) persis Afib S/p AF ablation 10/2023 Having rare, brief episodes where heart feels like it's beating hard in chest, unsure of cause at this time. I've asked her to continue to monitor, consider zio if burden increases No Afib that she is aware of  #) Hypercoag d/t afib CHA2DS2-VASc Score = at least 4 [CHF History: 0, HTN History: 1, Diabetes History: 0, Stroke History: 0, Vascular Disease History: 1, Age Score: 1, Gender Score: 1].  Therefore, the patient's annual risk of stroke is 4.8 %.    Stroke ppx - 5mg  eliquis  BID, appropriately dosed No bleeding concerns  #) HTN Better controlled on recheck I've asked her ot check BP 3-4 times per week and record measurements She has appt with PCP in ~2 weeks for routine follow-up Continue 40mg  lisinopril  daily at this time      Current medicines are reviewed at length with the patient today.   The patient does not have concerns regarding her medicines.  The following changes were made today:  none  Labs/ tests ordered today include:  Orders Placed This Encounter  Procedures   EKG 12-Lead     Disposition: Follow up with Dr. Cindie or EP APP in 2 months    Signed, Shawntel Farnworth, NP  12/09/23  1:40 PM  Electrophysiology [facility]

## 2023-12-09 ENCOUNTER — Encounter: Payer: Self-pay | Admitting: Cardiology

## 2023-12-09 ENCOUNTER — Ambulatory Visit: Attending: Cardiology | Admitting: Cardiology

## 2023-12-09 VITALS — BP 118/68 | HR 54 | Ht 66.0 in | Wt 172.0 lb

## 2023-12-09 DIAGNOSIS — I10 Essential (primary) hypertension: Secondary | ICD-10-CM | POA: Diagnosis not present

## 2023-12-09 DIAGNOSIS — I4819 Other persistent atrial fibrillation: Secondary | ICD-10-CM

## 2023-12-09 DIAGNOSIS — D6869 Other thrombophilia: Secondary | ICD-10-CM | POA: Diagnosis not present

## 2023-12-09 NOTE — Patient Instructions (Signed)
 Medication Instructions:   Your physician recommends the following medication changes.  STOP TAKING: Colchicine   *If you need a refill on your cardiac medications before your next appointment, please call your pharmacy*  Lab Work:  None ordered at this time   If you have labs (blood work) drawn today and your tests are completely normal, you will receive your results only by:  MyChart Message (if you have MyChart) OR  A paper copy in the mail If you have any lab test that is abnormal or we need to change your treatment, we will call you to review the results.  Testing/Procedures:  None ordered at this time   Referrals:  None ordered at this time   Follow-Up:  At [facility], you and your health needs are our priority.  As part of our continuing mission to provide you with exceptional heart care, our providers are all part of one team.  This team includes your primary Cardiologist (physician) and Advanced Practice Providers or APPs (Physician Assistants and Nurse Practitioners) who all work together to provide you with the care you need, when you need it.  Your next appointment:   2 month(s)  Provider:    Fonda Kitty, MD or Suzann Riddle, NP    We recommend signing up for the patient portal called MyChart.  Sign up information is provided on this After Visit Summary.  MyChart is used to connect with patients for Virtual Visits (Telemedicine).  Patients are able to view lab/test results, encounter notes, upcoming appointments, etc.  Non-urgent messages can be sent to your provider as well.   To learn more about what you can do with MyChart, go to [email or web address].   Other Instructions Avoid alcohol, caffeine, and exercise within 30 minutes before checking blood pressure. Sit still, with feet flat on the floor, in a straight backed, supportive chair.  Rest for 5 minutes.  Support arm on flat surface at heart level. Bottom of cuff should be placed directly  above the bend of the elbow. Take multiple readings.  Write down and bring to all follow up appointments.  Bring cuff to clinic periodically for accuracy comparison.

## 2023-12-26 ENCOUNTER — Encounter: Payer: Self-pay | Admitting: Family Medicine

## 2023-12-26 ENCOUNTER — Ambulatory Visit: Admitting: Family Medicine

## 2023-12-26 VITALS — BP 122/74 | HR 56 | Ht 66.0 in | Wt 174.4 lb

## 2023-12-26 DIAGNOSIS — I10 Essential (primary) hypertension: Secondary | ICD-10-CM

## 2023-12-26 DIAGNOSIS — I480 Paroxysmal atrial fibrillation: Secondary | ICD-10-CM | POA: Diagnosis not present

## 2023-12-26 DIAGNOSIS — R7989 Other specified abnormal findings of blood chemistry: Secondary | ICD-10-CM | POA: Diagnosis not present

## 2023-12-26 DIAGNOSIS — F411 Generalized anxiety disorder: Secondary | ICD-10-CM

## 2023-12-26 DIAGNOSIS — N1831 Chronic kidney disease, stage 3a: Secondary | ICD-10-CM

## 2023-12-26 NOTE — Progress Notes (Unsigned)
 Established patient visit   Patient: Whitney Stuart   DOB: [birth year]   73 y.o. Female  MRN: [id number] Visit Date: 12/26/2023  Today's healthcare provider: Jon Eva, MD   Chief Complaint  Patient presents with   Medical Management of Chronic Issues   Hypertension    Reports some lower leg edema    Hyperlipidemia   Anxiety   Subjective    HPI HPI     Hypertension    Additional comments: Reports some lower leg edema       Last edited by Lilian Fitzpatrick, CMA on 12/26/2023  2:53 PM.       Discussed the use of AI scribe software for clinical note transcription with the patient, who gave verbal consent to proceed.  History of Present Illness   Whitney Stuart is a 73 year old female with a history of myocardial infarction, hypertension, paroxysmal atrial fibrillation, cervical myelopathy, and chronic kidney disease stage 3A who presents for chronic disease follow-up.  She has arthritis-related pain in her right hip and lower back that worsens when she does not use her walker, and she sometimes feels she might fall. Recent X-rays were obtained to evaluate this pain.  Her home blood pressure readings range from the 120s up to 181/86.  She has generalized anxiety disorder and takes Zoloft  100 mg daily. She uses Klonopin  0.5 mg as needed, with a small ten-tablet fill last dispensed in September.  Her current medications include Crestor  20 mg daily, Lisinopril  40 mg daily, and Eliquis  5 mg twice daily. She tries to follow a heart-healthy diet with beets, spinach, and pomegranate juice.        Medications: Outpatient Medications Prior to Visit  Medication Sig   acetaminophen  (TYLENOL ) 325 MG tablet Take 2 tablets (650 mg total) by mouth every 6 (six) hours as needed for mild pain (pain score 1-3), fever or headache (or Fever >/= 101).   albuterol  (VENTOLIN  HFA) 108 (90 Base) MCG/ACT inhaler Inhale 1-2 puffs into the lungs every 6 (six) hours as needed for  wheezing or shortness of breath.   apixaban  (ELIQUIS ) 5 MG TABS tablet Take 1 tablet (5 mg total) by mouth 2 (two) times daily.   clonazePAM  (KLONOPIN ) 0.5 MG tablet Take 1 tablet (0.5 mg total) by mouth 2 (two) times daily as needed (anxiety).   cyclobenzaprine  (FLEXERIL ) 5 MG tablet TAKE 1 TABLET BY MOUTH EVERYDAY AT BEDTIME (Patient taking differently: Take 5 mg by mouth at bedtime as needed for muscle spasms.)   Fluticasone -Umeclidin-Vilant (TRELEGY ELLIPTA ) 100-62.5-25 MCG/ACT AEPB Inhale 1 puff into the lungs daily.   lisinopril  (ZESTRIL ) 40 MG tablet Take 1 tablet (40 mg total) by mouth daily.   nitroGLYCERIN  (NITROSTAT ) 0.4 MG SL tablet Place 1 tablet (0.4 mg total) under the tongue every 5 (five) minutes as needed for chest pain.   rosuvastatin  (CRESTOR ) 20 MG tablet Take 1 tablet (20 mg total) by mouth daily.   sertraline  (ZOLOFT ) 100 MG tablet Take 1 tablet (100 mg total) by mouth daily.   [DISCONTINUED] pantoprazole  (PROTONIX ) 40 MG tablet Take 1 tablet (40 mg total) by mouth daily. Post procedure medication, no refills necessary. (Patient not taking: Reported on 12/26/2023)   No facility-administered medications prior to visit.    Review of Systems     Objective    BP 122/74 (BP Location: Left Arm, Patient Position: Sitting, Cuff Size: Large)   Pulse (!) 56   Ht 5' 6 (1.676 m)  Wt 174 lb 6.4 oz (79.1 kg)   SpO2 98%   BMI 28.15 kg/m    Physical Exam Vitals reviewed.  Constitutional:      General: She is not in acute distress.    Appearance: Normal appearance. She is well-developed. She is not diaphoretic.  HENT:     Head: Normocephalic and atraumatic.  Eyes:     General: No scleral icterus.    Conjunctiva/sclera: Conjunctivae normal.  Neck:     Thyroid : No thyromegaly.  Cardiovascular:     Rate and Rhythm: Normal rate and regular rhythm.     Heart sounds: Normal heart sounds. No murmur heard. Pulmonary:     Effort: Pulmonary effort is normal. No respiratory  distress.     Breath sounds: Normal breath sounds. No wheezing, rhonchi or rales.  Musculoskeletal:     Cervical back: Neck supple.     Right lower leg: No edema.     Left lower leg: No edema.  Lymphadenopathy:     Cervical: No cervical adenopathy.  Skin:    General: Skin is warm and dry.     Findings: No rash.  Neurological:     Mental Status: She is alert and oriented to person, place, and time. Mental status is at baseline.  Psychiatric:        Mood and Affect: Mood normal.        Behavior: Behavior normal.      Results for orders placed or performed in visit on 12/26/23  Renal Function Panel  Result Value Ref Range   Glucose 88 70 - 99 mg/dL   BUN 17 8 - 27 mg/dL   Creatinine, Ser 8.76 (H) 0.57 - 1.00 mg/dL   eGFR 47 (L) >40 fO/fpw/8.26   BUN/Creatinine Ratio 14 12 - 28   Sodium 140 134 - 144 mmol/L   Potassium 4.6 3.5 - 5.2 mmol/L   Chloride 103 96 - 106 mmol/L   CO2 22 20 - 29 mmol/L   Calcium  9.9 8.7 - 10.3 mg/dL   Phosphorus 4.1 3.0 - 4.3 mg/dL   Albumin 4.3 3.8 - 4.8 g/dL  CBC w/Diff/Platelet  Result Value Ref Range   WBC 9.0 3.4 - 10.8 x10E3/uL   RBC 3.88 3.77 - 5.28 x10E6/uL   Hemoglobin 11.1 11.1 - 15.9 g/dL   Hematocrit 64.0 65.9 - 46.6 %   MCV 93 79 - 97 fL   MCH 28.6 26.6 - 33.0 pg   MCHC 30.9 (L) 31.5 - 35.7 g/dL   RDW 86.7 88.2 - 84.5 %   Platelets 399 150 - 450 x10E3/uL   Neutrophils 70 Not Estab. %   Lymphs 18 Not Estab. %   Monocytes 9 Not Estab. %   Eos 2 Not Estab. %   Basos 1 Not Estab. %   Neutrophils Absolute 6.2 1.4 - 7.0 x10E3/uL   Lymphocytes Absolute 1.7 0.7 - 3.1 x10E3/uL   Monocytes Absolute 0.8 0.1 - 0.9 x10E3/uL   EOS (ABSOLUTE) 0.2 0.0 - 0.4 x10E3/uL   Basophils Absolute 0.1 0.0 - 0.2 x10E3/uL   Immature Granulocytes 0 Not Estab. %   Immature Grans (Abs) 0.0 0.0 - 0.1 x10E3/uL  TSH + free T4  Result Value Ref Range   TSH 0.145 (L) 0.450 - 4.500 uIU/mL   Free T4 1.29 0.82 - 1.77 ng/dL    Assessment & Plan     Problem  List Items Addressed This Visit       Cardiovascular and Mediastinum   Essential (primary)  hypertension - Primary   Relevant Orders   Renal Function Panel (Completed)   Paroxysmal atrial fibrillation (HCC)   Relevant Orders   CBC w/Diff/Platelet (Completed)     Genitourinary   Chronic kidney disease, stage 3a (HCC)   Relevant Orders   Renal Function Panel (Completed)   CBC w/Diff/Platelet (Completed)     Other   GAD (generalized anxiety disorder)   Other Visit Diagnoses       Abnormal TSH       Relevant Orders   TSH + free T4 (Completed)           Chronic disease management: hypertension, paroxysmal atrial fibrillation, chronic kidney disease stage 3a, and history of myocardial infarction Blood pressure readings at home range from 120s to 181/86, with improvement after rest. Recent cardiology visit showed improved blood pressure. No changes in current medication regimen. Chronic kidney disease stage 3a. - Rechecked blood pressure at the end of the visit. - Continue current medications: Crestor , lisinopril , Eliquis . - Ordered renal function panel to align with nephrology follow-up.  Generalized anxiety disorder Anxiety is well-managed with current medication regimen. No recent exacerbations reported. - Continue current medications: Zoloft , Klonopin  as needed.  Osteoarthritis of right hip and lumbar spine Reports pain in right hip and lower back, exacerbated by lack of walker use. Instaflex supplement discussed, but glucosamine recommended for joint pain relief. - Recommended glucosamine chondroitin supplement for joint pain.  Abnormal thyroid  function studies Thyroid  function was slightly off in September. Plan to recheck thyroid  function to ensure normalization. - Ordered thyroid  function tests.        Return in about 3 months (around 03/25/2024) for chronic disease f/u.       Jon Eva, MD  [facility] [phone]  (phone) [phone] (fax)  [facility]

## 2023-12-27 LAB — CBC WITH DIFFERENTIAL/PLATELET
Basophils Absolute: 0.1 x10E3/uL (ref 0.0–0.2)
Basos: 1 %
EOS (ABSOLUTE): 0.2 x10E3/uL (ref 0.0–0.4)
Eos: 2 %
Hematocrit: 35.9 % (ref 34.0–46.6)
Hemoglobin: 11.1 g/dL (ref 11.1–15.9)
Immature Grans (Abs): 0 x10E3/uL (ref 0.0–0.1)
Immature Granulocytes: 0 %
Lymphocytes Absolute: 1.7 x10E3/uL (ref 0.7–3.1)
Lymphs: 18 %
MCH: 28.6 pg (ref 26.6–33.0)
MCHC: 30.9 g/dL — ABNORMAL LOW (ref 31.5–35.7)
MCV: 93 fL (ref 79–97)
Monocytes Absolute: 0.8 x10E3/uL (ref 0.1–0.9)
Monocytes: 9 %
Neutrophils Absolute: 6.2 x10E3/uL (ref 1.4–7.0)
Neutrophils: 70 %
Platelets: 399 x10E3/uL (ref 150–450)
RBC: 3.88 x10E6/uL (ref 3.77–5.28)
RDW: 13.2 % (ref 11.7–15.4)
WBC: 9 x10E3/uL (ref 3.4–10.8)

## 2023-12-27 LAB — RENAL FUNCTION PANEL
Albumin: 4.3 g/dL (ref 3.8–4.8)
BUN/Creatinine Ratio: 14 (ref 12–28)
BUN: 17 mg/dL (ref 8–27)
CO2: 22 mmol/L (ref 20–29)
Calcium: 9.9 mg/dL (ref 8.7–10.3)
Chloride: 103 mmol/L (ref 96–106)
Creatinine, Ser: 1.23 mg/dL — ABNORMAL HIGH (ref 0.57–1.00)
Glucose: 88 mg/dL (ref 70–99)
Phosphorus: 4.1 mg/dL (ref 3.0–4.3)
Potassium: 4.6 mmol/L (ref 3.5–5.2)
Sodium: 140 mmol/L (ref 134–144)
eGFR: 47 mL/min/1.73 — ABNORMAL LOW (ref >59)

## 2023-12-27 LAB — TSH+FREE T4
Free T4: 1.29 ng/dL (ref 0.82–1.77)
TSH: 0.145 u[IU]/mL — ABNORMAL LOW (ref 0.450–4.500)

## 2023-12-29 ENCOUNTER — Other Ambulatory Visit: Payer: Self-pay | Admitting: Family Medicine

## 2024-01-02 ENCOUNTER — Ambulatory Visit: Payer: Self-pay | Admitting: Family Medicine

## 2024-01-04 ENCOUNTER — Encounter

## 2024-01-04 ENCOUNTER — Telehealth: Payer: Self-pay

## 2024-01-04 DIAGNOSIS — I10 Essential (primary) hypertension: Secondary | ICD-10-CM

## 2024-01-04 NOTE — Telephone Encounter (Signed)
 Copied from CRM #[id number]. Topic: Clinical - Medical Advice >> Jan 04, 2024  8:51 AM Anairis L wrote: Reason for CRM: Lani from [facility] wanting to confirm chronic condition for Whitney Stuart.  Will be faxing in form.   [phone] Ref#[id number]

## 2024-01-05 NOTE — Telephone Encounter (Signed)
 Noted

## 2024-01-10 ENCOUNTER — Ambulatory Visit (INDEPENDENT_AMBULATORY_CARE_PROVIDER_SITE_OTHER)

## 2024-01-10 DIAGNOSIS — Z0000 Encounter for general adult medical examination without abnormal findings: Secondary | ICD-10-CM | POA: Diagnosis not present

## 2024-01-10 DIAGNOSIS — Z1211 Encounter for screening for malignant neoplasm of colon: Secondary | ICD-10-CM

## 2024-01-10 NOTE — Patient Instructions (Addendum)
 Ms. Tuccillo,  Thank you for taking the time for your Medicare Wellness Visit. I appreciate your continued commitment to your health goals. Please review the care plan we discussed, and feel free to reach out if I can assist you further.  Please note that Annual Wellness Visits do not include a physical exam. Some assessments may be limited, especially if the visit was conducted virtually. If needed, we may recommend an in-person follow-up with your provider.  Ongoing Care Seeing your primary care provider every 3 to 6 months helps us  monitor your health and provide consistent, personalized care.   Referrals If a referral was made during today's visit and you haven't received any updates within two weeks, please contact the referred provider directly to check on the status.  REFERRAL SENT FOR COLOGUARD PLEASE CALL TO SET UP YOUR MAMMOGRAM: [phone]  Recommended Screenings:  Health Maintenance  Topic Date Due   COVID-19 Vaccine (1) Never done   Zoster (Shingles) Vaccine (1 of 2) Never done   Breast Cancer Screening  05/11/2023   Flu Shot  04/24/2024*   Cologuard (Stool DNA test)  03/03/2024   Medicare Annual Wellness Visit  01/09/2025   Osteoporosis screening with Bone Density Scan  05/11/2027   DTaP/Tdap/Td vaccine (3 - Tdap) 05/31/2029   Pneumococcal Vaccine for age over 61  Completed   Hepatitis C Screening  Completed   Meningitis B Vaccine  Aged Out  *Topic was postponed. The date shown is not the original due date.     Vision: Annual vision screenings are recommended for early detection of glaucoma, cataracts, and diabetic retinopathy. These exams can also reveal signs of chronic conditions such as diabetes and high blood pressure.  Dental: Annual dental screenings help detect early signs of oral cancer, gum disease, and other conditions linked to overall health, including heart disease and diabetes.  Please see the attached documents for additional preventive care  recommendations.   NEXT AWV 01/15/25 @ 3:50 PM BY VIDEO

## 2024-01-10 NOTE — Progress Notes (Signed)
 Chief Complaint  Patient presents with   Medicare Wellness     Subjective:   Whitney Stuart is a 73 y.o. female who presents for a Medicare Annual Wellness Visit.  Visit info / Clinical Intake: Medicare Wellness Visit Type:: Subsequent Annual Wellness Visit Persons participating in visit and providing information:: patient Medicare Wellness Visit Mode:: Video Since this visit was completed virtually, some vitals may be partially provided or unavailable. Missing vitals are due to the limitations of the virtual format.: Unable to obtain vitals - no equipment If Telephone or Video please confirm:: I connected with patient using audio/video enable telemedicine. I verified patient identity with two identifiers, discussed telehealth limitations, and patient agreed to proceed. Patient Location:: HOME Provider Location:: OFFICE Interpreter Needed?: No Pre-visit prep was completed: yes AWV questionnaire completed by patient prior to visit?: no Living arrangements:: with family/others (LIVES W/ GRANDDAUGHTER) Patient's Overall Health Status Rating: (!) fair Typical amount of pain: none Does pain affect daily life?: no Are you currently prescribed opioids?: no  Dietary Habits and Nutritional Risks How many meals a day?: 3 Eats fruit and vegetables daily?: yes Most meals are obtained by: eating out In the last 2 weeks, have you had any of the following?: none Diabetic:: no  Functional Status Activities of Daily Living (to include ambulation/medication): Independent Ambulation: Independent Medication Administration: Independent Home Management (perform basic housework or laundry): Independent Manage your own finances?: yes Primary transportation is: driving; family / friends Concerns about vision?: no *vision screening is required for WTM* (RX TO READ- [facility]) Concerns about hearing?: no  Fall Screening Falls in the past year?: 1 Number of falls in past year: 1 Was there an  injury with Fall?: 0 Fall Risk Category Calculator: 2 Patient Fall Risk Level: Moderate Fall Risk  Fall Risk Patient at Risk for Falls Due to: History of fall(s) Fall risk Follow up: Falls evaluation completed; Falls prevention discussed  Home and Transportation Safety: All rugs have non-skid backing?: yes All stairs or steps have railings?: yes Grab bars in the bathtub or shower?: yes (HAS SEAT FOR SHOWER TOO) Have non-skid surface in bathtub or shower?: yes Good home lighting?: yes Regular seat belt use?: yes Hospital stays in the last year:: (!) yes How many hospital stays:: 2 Reason: MI; NECK SURGERY  Cognitive Assessment Difficulty concentrating, remembering, or making decisions? : no Will 6CIT or Mini Cog be Completed: yes What year is it?: 0 points What month is it?: 0 points Give patient an address phrase to remember (5 components): [address] [place], [place] About what time is it?: 0 points Count backwards from 20 to 1: 0 points Say the months of the year in reverse: 0 points Repeat the address phrase from earlier: 2 points 6 CIT Score: 2 points  Advance Directives (For Healthcare) Does Patient Have a Medical Advance Directive?: No Does patient want to make changes to medical advance directive?: No - Patient declined Type of Advance Directive: Healthcare Power of Attorney Copy of Healthcare Power of Attorney in Chart?: No - copy requested Would patient like information on creating a medical advance directive?: No - Patient declined  Reviewed/Updated  Reviewed/Updated: Reviewed All (Medical, Surgical, Family, Medications, Allergies, Care Teams, Patient Goals)    Allergies (verified) Methylprednisolone    Current Medications (verified) Outpatient Encounter Medications as of 01/10/2024  Medication Sig   acetaminophen  (TYLENOL ) 325 MG tablet Take 2 tablets (650 mg total) by mouth every 6 (six) hours as needed for mild pain (pain  score 1-3), fever or headache  (or Fever >/= 101).   albuterol  (VENTOLIN  HFA) 108 (90 Base) MCG/ACT inhaler Inhale 1-2 puffs into the lungs every 6 (six) hours as needed for wheezing or shortness of breath.   apixaban  (ELIQUIS ) 5 MG TABS tablet Take 1 tablet (5 mg total) by mouth 2 (two) times daily.   clonazePAM  (KLONOPIN ) 0.5 MG tablet Take 1 tablet (0.5 mg total) by mouth 2 (two) times daily as needed (anxiety).   Fluticasone -Umeclidin-Vilant (TRELEGY ELLIPTA ) 100-62.5-25 MCG/ACT AEPB Inhale 1 puff into the lungs daily.   lisinopril  (ZESTRIL ) 40 MG tablet Take 1 tablet (40 mg total) by mouth daily.   nitroGLYCERIN  (NITROSTAT ) 0.4 MG SL tablet Place 1 tablet (0.4 mg total) under the tongue every 5 (five) minutes as needed for chest pain.   rosuvastatin  (CRESTOR ) 20 MG tablet Take 1 tablet (20 mg total) by mouth daily. (Patient taking differently: Take 20 mg by mouth daily. TAKES 10 MG)   sertraline  (ZOLOFT ) 100 MG tablet Take 1 tablet (100 mg total) by mouth daily.   cyclobenzaprine  (FLEXERIL ) 5 MG tablet TAKE 1 TABLET BY MOUTH EVERYDAY AT BEDTIME (Patient not taking: Reported on 01/10/2024)   No facility-administered encounter medications on file as of 01/10/2024.    History: Past Medical History:  Diagnosis Date   Arthritis    Asthma    Asthma    Chest pain    a. 11/2019 Cor CTA: Calcium  score equals 0.  Normal coronary arteries.   CKD (chronic kidney disease)    Diastolic dysfunction    a. 11/2019 Echo: EF 60-65%.  Grade 1 diastolic dysfunction; b. 12/2021 Echo: EF 60-65%, GrI DD; c. 05/2022 Echo: EF 60-65%, no rwma. Nl RV fxn. RVSP 43.70mmHg. Sev dil LA. Mod MR.   GERD (gastroesophageal reflux disease)    History of cervical cancer    Hyperlipidemia    Hypertension    Mitral regurgitation    a. 12/22023 Echo: Mild-mod MR; b. 05/2022 Echo: Mod MR.   PAH (pulmonary artery hypertension) (HCC)    a. 11/2019 Echo: RVSP ; b .12/2021 Echo: RVSP 39.77mmHg; c. 05/2022 Echo: RVSP 43.80mmHg.   Persistent atrial  fibrillation (HCC)    a. Dx 04/2022-->Eliquis  (CHA2DS2VASc = 3); b. 06/2022 Zio: predominantly sinus rhythm at a rate of 66 bpm (range 44-154), 2 brief runs of SVT (fastest 154 bpm x 20 beats) and otherwise rare PACs and PVCs.   Sleep-disordered breathing    Past Surgical History:  Procedure Laterality Date   ABDOMINAL HYSTERECTOMY  1999   total   ANTERIOR CERVICAL CORPECTOMY N/A 07/16/2023   Procedure: C4-5 Anterior Cervical Corpectomy, arthrodesis and C3-6 Anterior instrumentation;  Surgeon: [name] ORN, MD;  Location: [facility] ORS;  Service: Neurosurgery;  Laterality: N/A;   ATRIAL FIBRILLATION ABLATION N/A 11/10/2023   Procedure: ATRIAL FIBRILLATION ABLATION;  Surgeon: Cindie Ole DASEN, MD;  Location: [facility];  Service: Cardiovascular;  Laterality: N/A;   HAND SURGERY Bilateral 2003   carpal tunnel   LEFT HEART CATH AND CORONARY ANGIOGRAPHY N/A 06/30/2023   Procedure: LEFT HEART CATH AND CORONARY ANGIOGRAPHY;  Surgeon: Darron Deatrice LABOR, MD;  Location: [facility] INVASIVE CV LAB;  Service: Cardiovascular;  Laterality: N/A;   LIPOMA EXCISION     POSTERIOR CERVICAL FUSION/FORAMINOTOMY N/A 07/16/2023   Procedure: C3-C6 Posterior Cervical  Fusion;  Surgeon: [name] ORN, MD;  Location: [facility] OR[facility];  Service: Neurosurgery;  Laterality: N/A;   Family History  Problem Relation Age of Onset   Heart disease  Mother    Stroke Mother    Congestive Heart Failure Mother    Hypertension Mother    Hypertension Father    Kidney disease Father    Heart murmur Sister    Hypertension Sister    Ovarian cancer Sister        ovarian   Hypertension Sister    Hypertension Sister    Lung cancer Brother    Hypertension Brother    Hypertension Brother    Sleep apnea Brother    Hypertension Brother    Heart disease Brother    Hypertension Brother    Hypertension Brother    Hypertension Brother    Hypertension Brother    Hypertension Brother    Mental illness Maternal Aunt    Congestive Heart  Failure Daughter    Colon cancer Neg Hx    Breast cancer Neg Hx    Social History   Occupational History   Occupation: Retired  Tobacco Use   Smoking status: Former    Current packs/day: 0.00    Average packs/day: 0.5 packs/day for 6.0 years (3.0 ttl pk-yrs)    Types: Cigarettes    Start date: 01/25/1991    Quit date: 01/24/1997    Years since quitting: 26.9   Smokeless tobacco: Never  Vaping Use   Vaping status: Never Used  Substance and Sexual Activity   Alcohol use: No   Drug use: No   Sexual activity: Yes    Birth control/protection: Surgical   Tobacco Counseling Counseling given: Not Answered  SDOH Screenings   Food Insecurity: No Food Insecurity (07/13/2023)  Housing: Low Risk (07/13/2023)  Transportation Needs: No Transportation Needs (07/13/2023)  Utilities: Not At Risk (07/13/2023)  Alcohol Screen: Low Risk (12/28/2022)  Depression (PHQ2-9): Low Risk (01/10/2024)  Financial Resource Strain: Low Risk (12/28/2022)  Physical Activity: Insufficiently Active (12/28/2022)  Social Connections: Moderately Isolated (07/13/2023)  Stress: No Stress Concern Present (01/10/2024)  Tobacco Use: Medium Risk (01/10/2024)  Health Literacy: Adequate Health Literacy (12/28/2022)   See flowsheets for full screening details  Depression Screen PHQ 2 & 9 Depression Scale- Over the past 2 weeks, how often have you been bothered by any of the following problems? Little interest or pleasure in doing things: 0 Feeling down, depressed, or hopeless (PHQ Adolescent also includes...irritable): 0 PHQ-2 Total Score: 0 Trouble falling or staying asleep, or sleeping too much: 0 Feeling tired or having little energy: 0 Poor appetite or overeating (PHQ Adolescent also includes...weight loss): 0 Feeling bad about yourself - or that you are a failure or have let yourself or your family down: 0 Trouble concentrating on things, such as reading the newspaper or watching television (PHQ Adolescent also  includes...like school work): 0 Moving or speaking so slowly that other people could have noticed. Or the opposite - being so fidgety or restless that you have been moving around a lot more than usual: 0 Thoughts that you would be better off dead, or of hurting yourself in some way: 0 PHQ-9 Total Score: 0 If you checked off any problems, how difficult have these problems made it for you to do your work, take care of things at home, or get along with other people?: Not difficult at all  Depression Treatment Depression Interventions/Treatment : EYV7-0 Score <4 Follow-up Not Indicated     Goals Addressed             This Visit's Progress    Cut out extra servings  Objective:    There were no vitals filed for this visit. There is no height or weight on file to calculate BMI.  Hearing/Vision screen Hearing Screening - Comments:: NO AIDS Vision Screening - Comments:: RX READERS-  EYE Immunizations and Health Maintenance Health Maintenance  Topic Date Due   COVID-19 Vaccine (1) Never done   Zoster Vaccines- Shingrix (1 of 2) Never done   Mammogram  05/11/2023   Influenza Vaccine  04/24/2024 (Originally 08/26/2023)   Fecal DNA (Cologuard)  03/03/2024   Medicare Annual Wellness (AWV)  01/09/2025   Bone Density Scan  05/11/2027   DTaP/Tdap/Td (3 - Tdap) 05/31/2029   Pneumococcal Vaccine: 50+ Years  Completed   Hepatitis C Screening  Completed   Meningococcal B Vaccine  Aged Out        Assessment/Plan:  This is a routine wellness examination for Wandra.  Patient Care Team: Myrla Jon HERO, MD as PCP - General (Family Medicine) Darron Deatrice LABOR, MD as PCP - Cardiology (Cardiology) Cindie Ole DASEN, MD as PCP - Electrophysiology (Cardiology) Ferol Rogue, MD (Inactive) as Consulting Physician (Ophthalmology) Riddle, Suzann, NP as Nurse Practitioner (Clinical Cardiac Electrophysiology) Vivienne Lonni Ingle, NP as Nurse Practitioner  (Cardiology) Pa, [facility])  I have personally reviewed and noted the following in the patients chart:   Medical and social history Use of alcohol, tobacco or illicit drugs  Current medications and supplements including opioid prescriptions. Functional ability and status Nutritional status Physical activity Advanced directives List of other physicians Hospitalizations, surgeries, and ER visits in previous 12 months Vitals Screenings to include cognitive, depression, and falls Referrals and appointments  No orders of the defined types were placed in this encounter.  In addition, I have reviewed and discussed with patient certain preventive protocols, quality metrics, and best practice recommendations. A written personalized care plan for preventive services as well as general preventive health recommendations were provided to patient.   Jhonnie GORMAN Das, LPN   [id number]/83/7974   Return in 1 year (on 01/09/2025).  After Visit Summary: (MyChart) Due to this being a telephonic visit, the after visit summary with patients personalized plan was offered to patient via MyChart   Nurse Notes: UTD ON SHOTS EXCEPT SHINGRIX; ALREADY HAS REFERRAL FOR MAMMOGRAM; COLOGUARD ORDERED; BDS UTD

## 2024-02-01 NOTE — Telephone Encounter (Signed)
 Copied from CRM #[id number]. Topic: Clinical - Prescription Issue >> Feb 01, 2024 11:01 AM Whitney Stuart wrote: Reason for CRM: Patient called in requesting the following: Her current medication: Lisinopril  (ZESTRIL ) 40 MG tablet -  She would prefer the dosage be split into 1 in am & 1 in pm:  20 gm (twice a day)   Patient also stated her insurance has recently changed from Medicare to [facility].  [facility] faxed a form to the clinic and is requesting that PCP - Whitney Stuart, Whitney HERO, MD will review/sign and fax the form back so that the patient can begin her new benefits & perks.    Please Advise

## 2024-02-02 MED ORDER — LISINOPRIL 20 MG PO TABS: 20.0000 mg | ORAL_TABLET | Freq: Every day | ORAL | 1 refills | Status: AC

## 2024-02-02 NOTE — Telephone Encounter (Signed)
 I have not seen a form for her.  Ok to change Lisinopril  Rx to 20mg  BID #180 r1. Thanks!

## 2024-02-02 NOTE — Telephone Encounter (Signed)
 Rx updated. Pt advised forms have not been received. Verbalized understanding

## 2024-02-02 NOTE — Addendum Note (Signed)
 Addended by: LILIAN SEVERO RAMAN on: 02/02/2024 02:57 PM   Modules accepted: Orders

## 2024-02-08 ENCOUNTER — Ambulatory Visit: Attending: Cardiology | Admitting: Cardiology

## 2024-02-08 ENCOUNTER — Encounter: Payer: Self-pay | Admitting: Cardiology

## 2024-02-08 VITALS — BP 126/68 | HR 53 | Ht 65.0 in | Wt 179.0 lb

## 2024-02-08 DIAGNOSIS — I4819 Other persistent atrial fibrillation: Secondary | ICD-10-CM | POA: Diagnosis not present

## 2024-02-08 DIAGNOSIS — I10 Essential (primary) hypertension: Secondary | ICD-10-CM | POA: Diagnosis not present

## 2024-02-08 DIAGNOSIS — I951 Orthostatic hypotension: Secondary | ICD-10-CM | POA: Diagnosis not present

## 2024-02-08 DIAGNOSIS — D6869 Other thrombophilia: Secondary | ICD-10-CM | POA: Diagnosis not present

## 2024-02-08 NOTE — Patient Instructions (Addendum)
 Medication Instructions:  Your physician recommends that you continue on your current medications as directed. Please refer to the Current Medication list given to you today.  *If you need a refill on your cardiac medications before your next appointment, please call your pharmacy*  Lab Work: No labs ordered today    Testing/Procedures: No test ordered today   Follow-Up: At [facility], you and your health needs are our priority.  As part of our continuing mission to provide you with exceptional heart care, our providers are all part of one team.  This team includes your primary Cardiologist (physician) and Advanced Practice Providers or APPs (Physician Assistants and Nurse Practitioners) who all work together to provide you with the care you need, when you need it.  Your next appointment:    As needed with Electrophysiology.   6 month(s)  Provider:   Deatrice Cage, MD or Lonni Meager, NP

## 2024-02-08 NOTE — Progress Notes (Signed)
 "     Electrophysiology Clinic Note    Date:  02/08/2024  Patient ID:  Whitney Stuart, Whitney Stuart January 31, 1950, MRN [id number] PCP:  Myrla Jon HERO, MD  Cardiologist:  Deatrice Cage, MD  Cardiology APP:  Vivienne Lonni Ingle, NP  Electrophysiologist:  Fonda Kitty, MD  Electrophysiology APP:  Jarred Purtee, NP    Discussed the use of AI scribe software for clinical note transcription with the patient, who gave verbal consent to proceed.   Patient Profile    Chief Complaint: AF ablation follow-up  History of Present Illness: Whitney Stuart is a 74 y.o. female with PMH notable for persis AFib, HTN, HLD, pulm HTN, asthma, CKD; seen today for Fonda Kitty, MD (Previously Dr. Cindie) for routine electrophysiology follow-up s/p AF Ablation.  She is s/p AF ablation w isolation of pulm veins, posterior wall on 10/16 by Dr. Cindie.  I saw her for routine 1 month post ablation appointment where she was having very brief episodes of palpitations, pulse during this time was at her baseline 50s.  We discussed obtaining Zio if burden increased  On follow-up today, she is not aware of any palpitation or A-fib episodes.  She has noticed somewhat increased dizziness for the past approximately 2 weeks when position changes, especially with laying to sitting changes.  She has not had any episodes of syncope, no falls.  She checks her BP at home, systolic ranging from 120-130.  She continues to take Eliquis  twice daily, no bleeding concerns.   Arrhythmia/Device History Amiodarone  - stopped 09/2023    ROS:  Please see the history of present illness. All other systems are reviewed and otherwise negative.    Physical Exam    VS:  BP 126/68 (BP Location: Left Arm, Patient Position: Sitting, Cuff Size: Normal)   Pulse (!) 53   Ht 5' 5 (1.651 m)   Wt 179 lb (81.2 kg)   SpO2 97%   BMI 29.79 kg/m  BMI: Body mass index is 29.79 kg/m.  Orthostatic VS for the past 24 hrs (Last 3 readings):   BP- Lying Pulse- Lying BP- Sitting Pulse- Sitting BP- Standing at 0 minutes Pulse- Standing at 0 minutes BP- Standing at 3 minutes Pulse- Standing at 3 minutes  02/08/24 1501 160/77 54 142/70 54 112/67 76 162/75 60          Wt Readings from Last 3 Encounters:  02/08/24 179 lb (81.2 kg)  12/26/23 174 lb 6.4 oz (79.1 kg)  12/09/23 172 lb (78 kg)     GEN- The patient is well appearing, alert and oriented x 3 today.   Lungs- Clear to ausculation bilaterally, normal work of breathing.  Heart- Regular rate and rhythm, no murmurs, rubs or gallops Extremities- No peripheral edema, warm, dry   Studies Reviewed   Previous EP, cardiology notes.    EKG is ordered. Personal review of EKG from today shows:    EKG Interpretation Date/Time:  Wednesday February 08 2024 14:45:59 EST Ventricular Rate:  53 PR Interval:  154 QRS Duration:  76 QT Interval:  466 QTC Calculation: 437 R Axis:   44  Text Interpretation: Sinus bradycardia Confirmed by Sarahelizabeth Conway [phone]) on 02/08/2024 2:49:24 PM    LHC, 06/30/2023   The left ventricular systolic function is normal.   LV end diastolic pressure is normal.   The left ventricular ejection fraction is 55-65% by visual estimate.   1.  Normal coronary arteries. 2.  Normal LV systolic function normal left ventricular end-diastolic  pressure.  Long term monitor, 07/12/2022 Patch Wear Time:  6 days and 20 hours (2024-06-02T23:27:32-0400 to 2024-06-09T19:57:07-398)   Patient had a min HR of 44 bpm, max HR of 154 bpm, and avg HR of 66 bpm. Predominant underlying rhythm was Sinus Rhythm.  2 Supraventricular Tachycardia runs occurred, the run with the fastest interval lasting 20 beats with a max rate of 154 bpm (avg 114 bpm); the run with the fastest interval was also the longest.  Rare PACs and rare PVCs.  TTE, 06/01/2022  1. Left ventricular ejection fraction, by estimation, is 60 to 65%. The left ventricle has normal function. The left ventricle has no  regional wall motion abnormalities. There is mild asymmetric left ventricular hypertrophy of the basal-septal segment. Left ventricular diastolic parameters are indeterminate. The average left ventricular global longitudinal strain is -10.2 %.   2. Right ventricular systolic function is normal. The right ventricular size is normal. There is mildly elevated pulmonary artery systolic pressure. The estimated right ventricular systolic pressure is 43.9 mmHg.   3. Left atrial size was severely dilated.   4. The mitral valve is normal in structure. Moderate mitral valve regurgitation. No evidence of mitral stenosis.   5. The aortic valve is tricuspid. Aortic valve regurgitation is not visualized. No aortic stenosis is present.   6. The inferior vena cava is normal in size with greater than 50% respiratory variability, suggesting right atrial pressure of 3 mmHg.    Assessment and Plan     #) persis Afib S/p AF ablation 10/2023 Initially had brief palpitation episodes, appear to have completely resolved.  #) Hypercoag d/t afib CHA2DS2-VASc Score = at least 4 [CHF History: 0, HTN History: 1, Diabetes History: 0, Stroke History: 0, Vascular Disease History: 1, Age Score: 1, Gender Score: 1].  Therefore, the patient's annual risk of stroke is 4.8 %.    Stroke ppx - 5mg  eliquis  BID, appropriately dosed No bleeding concerns, continue  #) HTN #) orthostatic hypotension Patient systolic blood pressure dropped approximately 50 mmHg with position changes, but resolved quickly Recommend she continue to change positions slowly and pause between to make sure that she is not dizzy or lightheaded Continue 20 mg lisinopril .      Current medicines are reviewed at length with the patient today.   The patient does not have concerns regarding her medicines.  The following changes were made today:  none  Labs/ tests ordered today include:  Orders Placed This Encounter  Procedures   EKG 12-Lead      Disposition: Follow up with Dr. Kennyth or EP APP PRN   Continue to follow regularly with PCP Past due for follow-up with general cardiology, recommend appt in ~46months   Signed, Jguadalupe Opiela, NP  02/08/2024  3:36 PM  Electrophysiology [facility] "

## 2024-02-14 ENCOUNTER — Ambulatory Visit: Payer: Self-pay | Admitting: Family Medicine

## 2024-02-14 ENCOUNTER — Ambulatory Visit: Admitting: Cardiology

## 2024-02-14 LAB — COLOGUARD: COLOGUARD: NEGATIVE

## 2024-03-26 ENCOUNTER — Ambulatory Visit: Admitting: Family Medicine

## 2024-05-01 ENCOUNTER — Ambulatory Visit: Admitting: Neurosurgery

## 2024-05-02 ENCOUNTER — Ambulatory Visit

## 2025-01-15 ENCOUNTER — Ambulatory Visit
